# Patient Record
Sex: Male | Born: 1950 | Race: Black or African American | Hispanic: No | Marital: Married | Smoking: Current every day smoker
Health system: Southern US, Community
[De-identification: ages and names within clinical notes are randomized; demographics above are authoritative.]

## PROBLEM LIST (undated history)

## (undated) DIAGNOSIS — R0600 Dyspnea, unspecified: Secondary | ICD-10-CM

## (undated) DIAGNOSIS — I10 Essential (primary) hypertension: Secondary | ICD-10-CM

## (undated) DIAGNOSIS — Z992 Dependence on renal dialysis: Secondary | ICD-10-CM

## (undated) DIAGNOSIS — Z972 Presence of dental prosthetic device (complete) (partial): Secondary | ICD-10-CM

## (undated) DIAGNOSIS — I96 Gangrene, not elsewhere classified: Secondary | ICD-10-CM

## (undated) DIAGNOSIS — E785 Hyperlipidemia, unspecified: Secondary | ICD-10-CM

## (undated) DIAGNOSIS — F0390 Unspecified dementia without behavioral disturbance: Secondary | ICD-10-CM

## (undated) DIAGNOSIS — I739 Peripheral vascular disease, unspecified: Secondary | ICD-10-CM

## (undated) DIAGNOSIS — Z720 Tobacco use: Secondary | ICD-10-CM

## (undated) DIAGNOSIS — F1910 Other psychoactive substance abuse, uncomplicated: Secondary | ICD-10-CM

## (undated) DIAGNOSIS — F32A Depression, unspecified: Secondary | ICD-10-CM

## (undated) DIAGNOSIS — Z9289 Personal history of other medical treatment: Secondary | ICD-10-CM

## (undated) DIAGNOSIS — E1140 Type 2 diabetes mellitus with diabetic neuropathy, unspecified: Secondary | ICD-10-CM

## (undated) DIAGNOSIS — Z89511 Acquired absence of right leg below knee: Secondary | ICD-10-CM

## (undated) DIAGNOSIS — J449 Chronic obstructive pulmonary disease, unspecified: Secondary | ICD-10-CM

## (undated) DIAGNOSIS — N186 End stage renal disease: Secondary | ICD-10-CM

## (undated) HISTORY — PX: AV FISTULA PLACEMENT: SHX1204

## (undated) HISTORY — DX: Type 2 diabetes mellitus with diabetic neuropathy, unspecified: E11.40

## (undated) HISTORY — PX: MULTIPLE TOOTH EXTRACTIONS: SHX2053

## (undated) HISTORY — PX: OTHER SURGICAL HISTORY: SHX169

## (undated) HISTORY — DX: Dependence on renal dialysis: Z99.2

## (undated) HISTORY — DX: Tobacco use: Z72.0

## (undated) HISTORY — DX: Hyperlipidemia, unspecified: E78.5

## (undated) HISTORY — DX: End stage renal disease: N18.6

## (undated) HISTORY — DX: Essential (primary) hypertension: I10

## (undated) HISTORY — DX: Peripheral vascular disease, unspecified: I73.9

---

## 2008-04-20 ENCOUNTER — Ambulatory Visit: Payer: Self-pay | Admitting: Vascular Surgery

## 2008-05-04 ENCOUNTER — Emergency Department (HOSPITAL_COMMUNITY): Admission: EM | Admit: 2008-05-04 | Discharge: 2008-05-04 | Payer: Self-pay | Admitting: Emergency Medicine

## 2008-05-04 ENCOUNTER — Ambulatory Visit (HOSPITAL_COMMUNITY): Admission: RE | Admit: 2008-05-04 | Discharge: 2008-05-04 | Payer: Self-pay | Admitting: Surgery

## 2008-05-04 ENCOUNTER — Ambulatory Visit: Payer: Self-pay | Admitting: Surgery

## 2008-08-24 ENCOUNTER — Ambulatory Visit: Payer: Self-pay | Admitting: Vascular Surgery

## 2008-08-27 ENCOUNTER — Ambulatory Visit (HOSPITAL_COMMUNITY): Admission: RE | Admit: 2008-08-27 | Discharge: 2008-08-27 | Payer: Self-pay | Admitting: Vascular Surgery

## 2008-08-27 ENCOUNTER — Ambulatory Visit: Payer: Self-pay | Admitting: Vascular Surgery

## 2009-04-18 ENCOUNTER — Ambulatory Visit (HOSPITAL_COMMUNITY): Admission: RE | Admit: 2009-04-18 | Discharge: 2009-04-18 | Payer: Self-pay | Admitting: Nephrology

## 2009-11-04 ENCOUNTER — Ambulatory Visit: Payer: Self-pay | Admitting: Surgery

## 2009-12-02 ENCOUNTER — Ambulatory Visit (HOSPITAL_COMMUNITY): Admission: RE | Admit: 2009-12-02 | Discharge: 2009-12-02 | Payer: Self-pay | Admitting: Vascular Surgery

## 2009-12-02 ENCOUNTER — Ambulatory Visit: Payer: Self-pay | Admitting: Vascular Surgery

## 2009-12-16 ENCOUNTER — Ambulatory Visit: Payer: Self-pay | Admitting: Surgery

## 2009-12-30 ENCOUNTER — Ambulatory Visit: Payer: Self-pay | Admitting: Surgery

## 2010-01-27 ENCOUNTER — Ambulatory Visit: Payer: Self-pay | Admitting: Surgery

## 2010-04-16 ENCOUNTER — Ambulatory Visit: Payer: Self-pay | Admitting: Vascular Surgery

## 2010-04-21 ENCOUNTER — Ambulatory Visit (HOSPITAL_COMMUNITY): Admission: RE | Admit: 2010-04-21 | Discharge: 2010-04-21 | Payer: Self-pay | Admitting: Vascular Surgery

## 2010-05-05 ENCOUNTER — Ambulatory Visit (HOSPITAL_COMMUNITY): Admission: RE | Admit: 2010-05-05 | Discharge: 2010-05-05 | Payer: Self-pay | Admitting: Vascular Surgery

## 2010-05-05 ENCOUNTER — Ambulatory Visit: Payer: Self-pay | Admitting: Vascular Surgery

## 2010-06-25 ENCOUNTER — Ambulatory Visit: Payer: Self-pay | Admitting: Vascular Surgery

## 2010-08-13 ENCOUNTER — Encounter
Admission: RE | Admit: 2010-08-13 | Discharge: 2010-08-13 | Payer: Self-pay | Source: Home / Self Care | Attending: Physical Medicine & Rehabilitation | Admitting: Physical Medicine & Rehabilitation

## 2010-12-07 ENCOUNTER — Encounter: Payer: Self-pay | Admitting: Nephrology

## 2011-02-01 LAB — POCT I-STAT 4, (NA,K, GLUC, HGB,HCT)
Glucose, Bld: 164 mg/dL — ABNORMAL HIGH (ref 70–99)
Hemoglobin: 11.2 g/dL — ABNORMAL LOW (ref 13.0–17.0)
Potassium: 5.6 mEq/L — ABNORMAL HIGH (ref 3.5–5.1)

## 2011-02-01 LAB — POCT I-STAT, CHEM 8
Creatinine, Ser: 12.4 mg/dL — ABNORMAL HIGH (ref 0.4–1.5)
Glucose, Bld: 193 mg/dL — ABNORMAL HIGH (ref 70–99)
Hemoglobin: 10.9 g/dL — ABNORMAL LOW (ref 13.0–17.0)
Potassium: 5.4 mEq/L — ABNORMAL HIGH (ref 3.5–5.1)

## 2011-02-01 LAB — GLUCOSE, CAPILLARY: Glucose-Capillary: 158 mg/dL — ABNORMAL HIGH (ref 70–99)

## 2011-02-02 LAB — BASIC METABOLIC PANEL
Chloride: 100 mEq/L (ref 96–112)
GFR calc Af Amer: 5 mL/min — ABNORMAL LOW (ref >60)
Potassium: 6 mEq/L — ABNORMAL HIGH (ref 3.5–5.1)

## 2011-02-02 LAB — POCT I-STAT 4, (NA,K, GLUC, HGB,HCT)
Glucose, Bld: 140 mg/dL — ABNORMAL HIGH (ref 70–99)
HCT: 36 % — ABNORMAL LOW (ref 39.0–52.0)
Hemoglobin: 12.6 g/dL — ABNORMAL LOW (ref 13.0–17.0)
Sodium: 134 mEq/L — ABNORMAL LOW (ref 135–145)

## 2011-02-02 LAB — GLUCOSE, CAPILLARY: Glucose-Capillary: 179 mg/dL — ABNORMAL HIGH (ref 70–99)

## 2011-02-23 LAB — GLUCOSE, CAPILLARY: Glucose-Capillary: 258 mg/dL — ABNORMAL HIGH (ref 70–99)

## 2011-03-10 ENCOUNTER — Inpatient Hospital Stay (HOSPITAL_COMMUNITY)
Admit: 2011-03-10 | Discharge: 2011-03-11 | DRG: 291 | Disposition: A | Discharge status: Home / Self Care | Payer: Medicare Other | Source: Other Acute Inpatient Hospital | Attending: Internal Medicine | Admitting: Internal Medicine

## 2011-03-10 ENCOUNTER — Inpatient Hospital Stay (HOSPITAL_COMMUNITY): Payer: Medicare Other

## 2011-03-10 DIAGNOSIS — I509 Heart failure, unspecified: Principal | ICD-10-CM | POA: Diagnosis present

## 2011-03-10 DIAGNOSIS — E785 Hyperlipidemia, unspecified: Secondary | ICD-10-CM | POA: Diagnosis present

## 2011-03-10 DIAGNOSIS — E1149 Type 2 diabetes mellitus with other diabetic neurological complication: Secondary | ICD-10-CM | POA: Diagnosis present

## 2011-03-10 DIAGNOSIS — Z794 Long term (current) use of insulin: Secondary | ICD-10-CM

## 2011-03-10 DIAGNOSIS — D649 Anemia, unspecified: Secondary | ICD-10-CM | POA: Diagnosis present

## 2011-03-10 DIAGNOSIS — I120 Hypertensive chronic kidney disease with stage 5 chronic kidney disease or end stage renal disease: Secondary | ICD-10-CM | POA: Diagnosis present

## 2011-03-10 DIAGNOSIS — N2581 Secondary hyperparathyroidism of renal origin: Secondary | ICD-10-CM | POA: Diagnosis present

## 2011-03-10 DIAGNOSIS — E8779 Other fluid overload: Secondary | ICD-10-CM | POA: Diagnosis present

## 2011-03-10 DIAGNOSIS — N186 End stage renal disease: Secondary | ICD-10-CM | POA: Diagnosis present

## 2011-03-10 DIAGNOSIS — E1142 Type 2 diabetes mellitus with diabetic polyneuropathy: Secondary | ICD-10-CM | POA: Diagnosis present

## 2011-03-10 LAB — GLUCOSE, CAPILLARY
Glucose-Capillary: 132 mg/dL — ABNORMAL HIGH (ref 70–99)
Glucose-Capillary: 181 mg/dL — ABNORMAL HIGH (ref 70–99)

## 2011-03-10 LAB — RENAL FUNCTION PANEL
Albumin: 3.2 g/dL — ABNORMAL LOW (ref 3.5–5.2)
BUN: 46 mg/dL — ABNORMAL HIGH (ref 6–23)
Chloride: 94 mEq/L — ABNORMAL LOW (ref 96–112)
Creatinine, Ser: 9.44 mg/dL — ABNORMAL HIGH (ref 0.4–1.5)
Glucose, Bld: 111 mg/dL — ABNORMAL HIGH (ref 70–99)

## 2011-03-10 LAB — CBC
MCH: 33.7 pg (ref 26.0–34.0)
MCV: 99.2 fL (ref 78.0–100.0)
Platelets: 134 10*3/uL — ABNORMAL LOW (ref 150–400)
RBC: 2.58 MIL/uL — ABNORMAL LOW (ref 4.22–5.81)
RDW: 16.1 % — ABNORMAL HIGH (ref 11.5–15.5)

## 2011-03-10 LAB — MRSA PCR SCREENING: MRSA by PCR: NEGATIVE

## 2011-03-10 LAB — CARDIAC PANEL(CRET KIN+CKTOT+MB+TROPI)
CK, MB: 2.3 ng/mL (ref 0.3–4.0)
CK, MB: 2.2 ng/mL (ref 0.3–4.0)

## 2011-03-11 LAB — COMPREHENSIVE METABOLIC PANEL
AST: 30 U/L (ref 0–37)
CO2: 28 mEq/L (ref 19–32)
Chloride: 105 mEq/L (ref 96–112)
Creatinine, Ser: 5.74 mg/dL — ABNORMAL HIGH (ref 0.4–1.5)
GFR calc Af Amer: 12 mL/min — ABNORMAL LOW (ref >60)
GFR calc non Af Amer: 10 mL/min — ABNORMAL LOW (ref >60)
Glucose, Bld: 117 mg/dL — ABNORMAL HIGH (ref 70–99)
Total Bilirubin: 0.7 mg/dL (ref 0.3–1.2)

## 2011-03-11 LAB — GLUCOSE, CAPILLARY
Glucose-Capillary: 134 mg/dL — ABNORMAL HIGH (ref 70–99)
Glucose-Capillary: 144 mg/dL — ABNORMAL HIGH (ref 70–99)

## 2011-03-11 LAB — CBC
HCT: 28.4 % — ABNORMAL LOW (ref 39.0–52.0)
Hemoglobin: 9.4 g/dL — ABNORMAL LOW (ref 13.0–17.0)
MCH: 33.6 pg (ref 26.0–34.0)
MCV: 101.4 fL — ABNORMAL HIGH (ref 78.0–100.0)
RBC: 2.8 MIL/uL — ABNORMAL LOW (ref 4.22–5.81)

## 2011-03-11 LAB — DIFFERENTIAL
Basophils Relative: 0 % (ref 0–1)
Lymphocytes Relative: 10 % — ABNORMAL LOW (ref 12–46)
Lymphs Abs: 0.8 10*3/uL (ref 0.7–4.0)
Monocytes Relative: 9 % (ref 3–12)
Neutro Abs: 6.4 10*3/uL (ref 1.7–7.7)
Neutrophils Relative %: 78 % — ABNORMAL HIGH (ref 43–77)

## 2011-03-11 LAB — LIPID PANEL: Triglycerides: 63 mg/dL (ref <150)

## 2011-03-11 LAB — CARDIAC PANEL(CRET KIN+CKTOT+MB+TROPI)
Relative Index: 2.2 (ref 0.0–2.5)
Total CK: 101 U/L (ref 7–232)
Troponin I: 0.09 ng/mL — ABNORMAL HIGH (ref 0.00–0.06)

## 2011-03-11 LAB — HEMOGLOBIN A1C: Mean Plasma Glucose: 131 mg/dL — ABNORMAL HIGH (ref <117)

## 2011-03-17 ENCOUNTER — Encounter (HOSPITAL_COMMUNITY): Payer: Medicare Other | Admitting: Radiology

## 2011-03-25 ENCOUNTER — Ambulatory Visit (HOSPITAL_COMMUNITY): Payer: Medicare Other | Attending: Cardiology | Admitting: Radiology

## 2011-03-25 VITALS — Ht 64.0 in | Wt 120.0 lb

## 2011-03-25 DIAGNOSIS — R0989 Other specified symptoms and signs involving the circulatory and respiratory systems: Secondary | ICD-10-CM | POA: Insufficient documentation

## 2011-03-25 DIAGNOSIS — R5381 Other malaise: Secondary | ICD-10-CM | POA: Insufficient documentation

## 2011-03-25 DIAGNOSIS — F17200 Nicotine dependence, unspecified, uncomplicated: Secondary | ICD-10-CM | POA: Insufficient documentation

## 2011-03-25 DIAGNOSIS — R0609 Other forms of dyspnea: Secondary | ICD-10-CM | POA: Insufficient documentation

## 2011-03-25 DIAGNOSIS — R61 Generalized hyperhidrosis: Secondary | ICD-10-CM | POA: Insufficient documentation

## 2011-03-25 DIAGNOSIS — R0789 Other chest pain: Secondary | ICD-10-CM | POA: Insufficient documentation

## 2011-03-25 DIAGNOSIS — I739 Peripheral vascular disease, unspecified: Secondary | ICD-10-CM | POA: Insufficient documentation

## 2011-03-25 DIAGNOSIS — R079 Chest pain, unspecified: Secondary | ICD-10-CM

## 2011-03-25 DIAGNOSIS — Z794 Long term (current) use of insulin: Secondary | ICD-10-CM | POA: Insufficient documentation

## 2011-03-25 DIAGNOSIS — E119 Type 2 diabetes mellitus without complications: Secondary | ICD-10-CM | POA: Insufficient documentation

## 2011-03-25 DIAGNOSIS — I10 Essential (primary) hypertension: Secondary | ICD-10-CM | POA: Insufficient documentation

## 2011-03-25 DIAGNOSIS — R5383 Other fatigue: Secondary | ICD-10-CM | POA: Insufficient documentation

## 2011-03-25 MED ORDER — TECHNETIUM TC 99M TETROFOSMIN IV KIT
11.0000 | PACK | Freq: Once | INTRAVENOUS | Status: AC | PRN
  Administered 2011-03-25: 11 via INTRAVENOUS

## 2011-03-25 MED ORDER — TECHNETIUM TC 99M TETROFOSMIN IV KIT
33.0000 | PACK | Freq: Once | INTRAVENOUS | Status: AC | PRN
  Administered 2011-03-25: 33 via INTRAVENOUS

## 2011-03-25 MED ORDER — REGADENOSON 0.4 MG/5ML IV SOLN
0.4000 mg | Freq: Once | INTRAVENOUS | Status: AC
  Administered 2011-03-25: 0.4 mg via INTRAVENOUS

## 2011-03-25 NOTE — Progress Notes (Signed)
[facility]UCLE[facility] [place] [address] [phone]  Cardiology Nuclear Med Garrett Salas is a 60 y.o. male [id number] 07/06/1951   Nuclear Med Background Indication for Stress Test:  Evaluation for Ischemia and Post Hospital:03/10/11 CP,CHF, (-) enzymes History:  ~14 yrs ago PTCA (done at the [place]), 4/12 Echo:EF=60-65% Cardiac Risk Factors: Hypertension, IDDM Type 2, Lipids, PVD and Smoker  Symptoms:  Chest Pain/Pressure.  (last episode of chest discomfort has been none since discharge), Diaphoresis, DOE and Fatigue   Nuclear Pre-Procedure Caffeine/Decaff Intake:  None NPO After: 6:30pm   Lungs:  Clear.  O2 sat 96% on RA. IV 0.9% NS with Angio Cath:  20g  IV Site: L Antecubital  IV Started by:  Irven Baltimore, RN  Chest Size (in):  38 Cup Size: n/a  Height: 5\' 4"  (1.626 m)  Weight:  120 lb (54.432 kg)  BMI:  Body mass index is 20.60 kg/(m^2). Tech Comments:n/a    Nuclear Med Study 1 or 2 day study: 1 day  Stress Test Type:  Carlton Adam  Reading MD: Loralie Champagne, MD  Order Authorizing Provider:  Minus Breeding, MD  Resting Radionuclide: Technetium 36m Tetrofosmin  Resting Radionuclide Dose: 11.0 mCi   Stress Radionuclide:  Technetium 39m Tetrofosmin  Stress Radionuclide Dose: 33.0 mCi           Stress Protocol Rest HR: 64 Stress HR: 75  Rest BP: 147/72 Stress BP: 159/61  Exercise Time (min): n/a METS: n/a   Predicted Max HR: 162 bpm % Max HR: 46.91 bpm Rate Pressure Product: 11248   Dose of Adenosine (mg):  n/a Dose of Lexiscan: 0.4 mg  Dose of Atropine (mg): n/a Dose of Dobutamine: n/a mcg/kg/min (at max HR)  Stress Test Technologist: Letta Moynahan, CMA-N  Nuclear Technologist:  Charlton Amor, CNMT     Rest Procedure:  Myocardial perfusion imaging was performed at rest 45 minutes following the intravenous administration of Technetium 4m Tetrofosmin. Rest ECG: No acute changes.  Stress Procedure:  The patient  received IV Lexiscan 0.4 mg over 15-seconds.  Technetium 44m Tetrofosmin injected at 30-seconds.  There were no significant changes with Lexiscan.  Quantitative spect images were obtained after a 45 minute delay. Stress ECG: No significant change from baseline ECG and No significant ST segment change suggestive of ischemia.  QPS Raw Data Images:  Normal; no motion artifact; normal heart/lung ratio. Stress Images:  Normal homogeneous uptake in all areas of the myocardium. Rest Images:  Normal homogeneous uptake in all areas of the myocardium. Subtraction (SDS):  There is no evidence of scar or ischemia. Transient Ischemic Dilatation (Normal <1.22):  1.10 Lung/Heart Ratio (Normal <0.45):  0.29  Quantitative Gated Spect Images QGS EDV:  182 ml QGS ESV:  111 ml QGS cine images:  Moderate global hypokinesis.  QGS EF: 39%  Impression Exercise Capacity:  Lexiscan with no exercise. BP Response:  Normal blood pressure response. Clinical Symptoms:  No chest pain. ECG Impression:  No significant ST segment change suggestive of ischemia. Comparison with Prior Nuclear Study: No previous nuclear study performed  Overall Impression:  No evidence for ischemia or infarction by perfusion imaging but there dose appear to be global LV hypokinesis with EF 39%.  This suggests nonischemic cardiomyopathy.   Garrett Salas

## 2011-03-26 NOTE — Progress Notes (Signed)
COPY ROUTED TO DR. HOCHREIN

## 2011-03-31 NOTE — Consult Note (Signed)
NEW PATIENT CONSULTATION   Garrett Salas, Garrett Salas  DOB:  1951-11-09                                       04/20/2008  [id number]   The patient presents today for evaluation of permanent access placement  for hemodialysis.  He had a Diatek catheter placed February 19 at [facility].  he [place] on Tuesday, Thursday, and Saturday  at [place].  He has had no difficulty thus far with his catheter.  His  prior medical history is significant for insulin dependent diabetes,  hypertension.   FAMILY HISTORY:  Negative for premature atherosclerotic disease.   SOCIAL HISTORY:  He does not smoke having quit 1 year ago, and does not  drink alcohol.   He has no known drug allergies.   MEDICATIONS:  Simvastatin, clonidine, metoprolol, Lotrel, and insulin.   PHYSICAL EXAM:  Well-developed thin black male in no acute distress, in  a wheelchair.  His radial pulses are 2+ bilaterally.  He does have  moderate surface veins bilaterally.  He underwent bilateral venous  mapping.  He reports that he is ambidextrous.  He does appear to have  adequate cephalic vein in the left arm for attempt at AV fistula  placement.  I reviewed his vein mapping with the patient and his wife  and have recommended that he proceed with a left arm AV fistula.  I feel  that he, in all likelihood has adequate flow for wrist fistula, but also  has good vein size for upper arm fistula if this is required.  We will  schedule this at his request around his wife's work schedule.  He is  scheduled for a left wrist fistula on the morning of 05/04/2008 at [place], M.D.  Electronically Signed   TFE/MEDQ  D:  04/20/2008  T:  04/23/2008  Job:  [id number]   cc:   Sherril Croon, M.D.

## 2011-03-31 NOTE — Assessment & Plan Note (Signed)
OFFICE VISIT   Garrett Salas, Garrett Salas  DOB:  May 11, 1951                                       11/04/2009  [id number]   REFERRING PHYSICIAN:  Dr. Justin Mend.   CHIEF COMPLAINT:  Left toe pain.   HISTORY:  This is a 60 year old gentleman with multiple medical problems  who has had a soreness and swelling in his left second toe for  approximately 6 weeks.  This appears to be getting worse.  The patient's  medical problems include hepatitis C, end-stage renal disease, for which  he dialyzes on Tuesday, Thursday, and Saturday through a left  radiocephalic fistula.  Patient has uncontrolled hypertension as well as  hypercholesterolemia.  These are both treated medically.  He also  suffers from peripheral neuropathy secondary to his diabetes.  He also  has peripheral artery disease and retinopathy.  He is legally blind.   FAMILY HISTORY:  Positive for cancer in his mother.   SOCIAL HISTORY:  He is married with 5 children.  He currently smokes 1  pack a day.  He does not drink alcohol on a regular basis.   PAST MEDICAL HISTORY:  Diabetes, hypertension, hyperlipidemia, end-stage  renal disease, peripheral neuropathy, anemia, secondary  hyperparathyroidism, history of membranous proliferative  glomerulonephritis, history of hepatitis C, peripheral artery disease,  history of retinopathy and legally blind.   ALLERGIES:  None.   PHYSICAL EXAMINATION:  Heart rate 65, blood pressure 159/77, temperature  97.8.  GENERAL:  He is well-appearing in no distress.  Head and eyes:  Normocephalic and atraumatic.  Pupils are equal.  Sclerae anicteric.  Lungs:  Respirations are nonlabored.  Cardiovascular:  Regular rate and  rhythm.  Abdomen:  Soft, nontender.  Musculoskeletal:  Without major  deformities or cyanosis.  The left second toe is more prominent.  There  is no erythema.  Skin:  Without rash.  Neurologically, he is intact  without focal weakness.  Psych  He has a normal  affect.   DIAGNOSTICS:  I have reviewed the patient's ultrasound report that shows  ankle brachial index of 0.72 on the left and 0.94 on the right.   ASSESSMENT:  Ischemic left second toe.   PLAN:  The patient will be scheduled for an arteriogram of the left leg.  I discussed the details of the procedure as well as the possibility of a  percutaneous intervention, versus requiring bypass to improve blood flow  to his foot in order to heal this area.  He also understands that he  could end up with a toe amputation.  He knows this is a leg-threatening  process.  He does not wish to have this done before Christmas;  therefore, I am scheduling his procedure for Tuesday, January 11.  I  will try to do this as a first case to coordinate with his dialysis  schedule.  Hopefully, he can do dialysis later on that day.     [name], MD  Electronically Signed   VWB/MEDQ  D:  11/04/2009  T:  11/05/2009  Job:  [id number]   cc:   Sherril Croon, M.D.

## 2011-03-31 NOTE — Assessment & Plan Note (Signed)
OFFICE VISIT   Garrett Salas, Garrett Salas  DOB:  29-Mar-1951                                       06/25/2010  [id number]   The patient returns for followup today after placement of a right  brachiocephalic AV fistula on June 21.  He intermittently has some mild  numbness and tingling in his right hand early in the morning and then  this resolves.  It has not been very disabling to him.  He has no other  signs or symptoms of steal.   On physical exam today blood pressure is 153/98 in the right leg, heart  rate is 70 and regular.  Right upper extremity has an easily palpable  thrill and audible bruit in the fistula.  The vein is palpable  throughout most of the course of the upper arm.   I believe the best option is to continue to allow this fistula to  mature.  He was instructed in exercising the fistula today.  He will  return for followup in six weeks' time.  I have scheduled him for  followup with Dr. Scot Dock since he has seen him previously and my office  day will be switching to the patient's dialysis day.  He preferred to be  seen on a nondialysis day since he has to drive from [place].  Hopefully his fistula will be ready to use near the time of his next  followup appointment.     Jessy Oto. Fields, MD  Electronically Signed   CEF/MEDQ  D:  06/25/2010  T:  06/26/2010  Job:  [id number]   cc:   Maudie Flakes. Hassell Done, M.D.

## 2011-03-31 NOTE — Assessment & Plan Note (Signed)
OFFICE VISIT   Garrett Salas, Garrett Salas  DOB:  1951-03-28                                       01/27/2010  [id number]   REFERRING PHYSICIAN:  Dr. Justin Mend.   REASON FOR VISIT:  Follow-up left second toe.   HISTORY:  Patient is a 60 year old gentleman that I met in December  where he had soreness and swelling of his left second toe that had been  going on for approximately 6 weeks.  It was getting worse at that time.  He was scheduled for an arteriogram; however, he did not want that to be  done before the holiday.  It therefore was actually done by Dr. Scot Dock  on January 17th.  The patient was found to have approximately 40% long  segment stenosis within his proximal superficial femoral artery and a  focal 40% stenosis below that.  There was moderate disease in the  popliteal artery.  The patient had an ankle brachial index of 0.72.  I  felt that he most likely will be able to heal this without intervention.  He comes back in today for continued follow-up.  He says that his toe  has improved.  He is not walking much but says it is stiff.  He is  usually in a wheelchair secondary to multiple falls.   PHYSICAL EXAMINATION:  Blood pressure is 187/100, heart rate is 64,  temperature is 98.0.  He is well-appearing in no distress.  Respirations  are nonlabored.  The left second toe appears to have decreased edema.  There is no evidence of infection.  There is no ulceration or open  wounds.   PLAN:  The patient continues to improve.  I think he will be able to  resolve this issue without an intervention.  I will plan on seeing him  back on a p.r.n. basis.  I did instruct him that if he notices any  changes to contact me immediately, as timing can make a difference.  He  understands this.     [name], MD  Electronically Signed   VWB/MEDQ  D:  01/27/2010  T:  01/28/2010  Job:  [id number]   cc:   Sherril Croon, M.D.

## 2011-03-31 NOTE — Procedures (Signed)
CEPHALIC VEIN MAPPING   INDICATION:  Preop exam for AV fistula placement.   HISTORY:   EXAM:  The right cephalic vein is compressible.   Diameter measurements range from 0.26 to 0.33 cm.   The left cephalic vein is compressible.   Diameter measurements range from 0.27 to 0.49 cm.   See attached worksheet for all measurements.   IMPRESSION:  1. Patent right cephalic vein which is of questionable diameter for      use as a dialysis access site.  2. Patent left cephalic vein which is of acceptable diameter for use      as a dialysis access site.   ___________________________________________  Rosetta Posner, M.D.   CH/MEDQ  D:  04/20/2008  T:  04/20/2008  Job:  [id number]

## 2011-03-31 NOTE — Procedures (Signed)
DIALYSIS GRAFT DUPLEX EVALUATION   INDICATION:  Poor access flow.   HISTORY:   DUPLEX:                                   Duplex Velocities  Inflow artery                   78 cm/s  Inflow anastomosis              125 cm/s  Mid arterial limb               266 cm/s  Mid to distal 613 cm/s  Mid graft                       282 cm/s  Mid venous limb                 54 cm/s  Outflow anastomosis             46 cm/s  Outflow vein                    84 cm/s   IMPRESSION:  There appears to be elevated velocities of 613 cm/s at the  proximal to mid arterial limb.  The antecubital fossa has velocities of  282 cm/s.   ___________________________________________  Jessy Oto. Fields, MD   CB/MEDQ  D:  04/16/2010  T:  04/16/2010  Job:  [id number]

## 2011-03-31 NOTE — Assessment & Plan Note (Signed)
OFFICE VISIT   DELVANTE, HILLING  DOB:  May 10, 1951                                       12/30/2009  [id number]   The patient returns today for evaluation of his left second toe.  Again,  the patient has undergone arteriogram.  He had mild disease in his  superficial femoral artery with significant changes in his below-knee  popliteal artery and stenosis of about 60%.  Ankle-brachial indices were  0.72.  I feel that he will most likely have adequate blood supply to  heal this.  He comes back in today for followup.  He did not tolerate  this pain medicine secondary to GI issues.   PHYSICAL EXAMINATION:  Heart rate 79, blood pressure 106/70, temperature  is 97.6.  Generally, he is well-appearing, in no distress.  Respirations  are nonlabored.  Extremities:  The left second toe has decreased edema.  There is less tenderness.  There is no erythema.   At this point I am optimistic that this will be able to heal without  intervention.  I am going to see him back in a month.  I did give him  Tylox today instead of his Percocet, which he did not tolerate.     [name], MD  Electronically Signed   VWB/MEDQ  D:  12/30/2009  T:  12/31/2009  Job:  [id number]   cc:   Sherril Croon, M.D.

## 2011-03-31 NOTE — Assessment & Plan Note (Signed)
OFFICE VISIT   KASI, DAIN  DOB:  1951-07-30                                       12/16/2009  [id number]   The patient returns today for follow-up evaluation of his left second  toe.  I first met him in December, where he had swelling and pain in his  left second toe for approximately 6 weeks which was getting worse.  I  had scheduled him to undergo an arteriogram.  He did not want to do this  before Christmas.  It was scheduled for January 11.  He rescheduled and  it was performed by Dr. Scot Dock on January 17.  Findings included mild  disease of the superficial femoral artery.  The patient does have  significant atherosclerotic changes in his below-knee popliteal artery  with a stenosis of approximately 60%.  However, he appears to have  adequate blood flow down onto his foot.  He comes back today for follow-  up.   On examination, heart rate 79, blood pressure 143/74, temperature is 98.  He is well-appearing, in no distress.  The left second toe is somewhat  swollen.  The DIP joint is tender.  There is no erythema.   Based on the patient's arteriogram and ankle-brachial index of 0.72, I  feel like this process should improve with conservative management.  I  have given him a prescription for Percocet to help with the pain.  He is  also encouraged to take Advil as an anti-inflammatory.  I am going to  see him back in 2 weeks to see if he has had any benefit.  Our options  at this point will be __________ is warranted.     [name], MD  Electronically Signed   VWB/MEDQ  D:  12/16/2009  T:  12/18/2009  Job:  [id number]   cc:   Sherril Croon, M.D.

## 2011-03-31 NOTE — Assessment & Plan Note (Signed)
OFFICE VISIT   Garrett Salas, Garrett Salas  DOB:  15-Oct-1951                                       04/16/2010  [id number]   CHIEF COMPLAINT:  Poorly performing left AV fistula.   HISTORY OF PRESENT ILLNESS:  The patient is a 60 year old male who  currently is dialyzing via a left radiocephalic AV fistula.  This was  placed by Dr. Donnetta Hutching in October 2009.  He apparently has been having low  flow rates requiring placement of tourniquet on his arm during dialysis  to have reasonable flow.  He also had an angioplasty of the cephalic  subclavian junction at [facility] on Mar 17, 2010.  He denies any  numbness or tingling in his hand.  He denies any difficulty with  dialysis other than the fact that they have low flow on the machine  alarms frequently.   CHRONIC MEDICAL PROBLEMS:  1. Diabetes.  2. Renal failure requiring dialysis.  3. Hypertension.  4. Hyperlipidemia.  5. Peripheral arterial occlusive disease.  These are all currently controlled, most of these are followed by Dr.  Hassell Done.  He also has a history of hepatitis C, alcohol and drug abuse.  He  is a former smoker.  He has been seen by Dr. Scot Dock and Dr. Trula Slade IV  in the past for peripheral arterial occlusive disease.   Well except as mentioned above.   REVIEW OF SYSTEMS:  He denies any shortness of breath or chest pain.   MEDICATIONS:  1. Procardia XL 30 mg q.h.s.  2. Rena-Vite once daily.  3. Hydrocodone 5 mg as needed.  4. Lantus insulin 25 units q.h.s.  5. Atarax p.r.n.  6. Nabumetone 750 mg b.i.d.  7. Cetirizine 10 mg once a day.  8. Reglan 10 mg q.a.c. and q.h.s.  9. Renvela 800 mg two q.a.c. t.i.d.  10.Triamcinolone.  11.Simvastatin 40 mg once daily.  12.NovoLog insulin sliding scale meal coverage.  13.Gabapentin 400 mg q.h.s.   ALLERGIES:  LISTED TO THE OXYCODONE AND ALBUTEROL, BUT AS NOTED ABOVE HE  DOES TAKE HYDROCODONE.   PHYSICAL EXAM:  Blood pressure is 135/83 in the right  arm, heart rate 68  and regular.  Temperature is 98.0.  HEENT is unremarkable.  Neck:  Has  2+ carotid pulses without bruit.  Extremities:  He has no edema  bilaterally.  There is an easily palpable thrill in left radiocephalic  fistula proximal aspect of this is fairly small and the vein dilates in  its midportion segment and becomes small in the upper arm again.  There  is an audible bruit.  Right upper extremity has a 2+ brachial and radial  pulse.  Cephalic vein is not easily visible on the skin.  Chest is clear  to auscultation bilaterally.   He had a vein mapping ultrasound today of his right upper extremity  which shows a small cephalic vein less than 2 mm in diameter throughout  its course.  The basilic vein is also essentially less than 2 mm in  diameter throughout its course.  I reviewed the images from his  shuntogram recently performed at [facility] which shows diffuse  narrowing of the proximal aspect of the fistula as well as a high-grade  stenosis which was angioplastied at the cephalic subclavian junction.   After review the films of his left AV fistula I  do not believe that this  is really going to be revisable.  There are multiple areas that have  narrowing in it and the cephalic vein on the left side is really not a  good outflow vein at this point.  I do not believe he is a candidate for  a left brachiocephalic fistula because of chronic intermittent  angioplasty that have been required at the cephalic subclavian junction.  I believe the best option at this point will be to place a new access.  The cephalic vein is poor as well as his basilic in the right arm, we  will explore his right cephalic fistula at the antecubital level and if  this is confirmed to be small then most likely we would end up placing a  right upper arm AV graft.  He can continue to use a left arm fistula  until this matures to avoid using a catheter.  I discussed all this with  the  patient today.  We have scheduled him for placement of the right arm  AV graft versus fistula on April 21, 2010.  Risks, benefits, possible  complications and procedure details including but not limited to  bleeding, infection, graft occlusion, nonmaturation of fistula and  ischemic steal were explained to the patient today, he understands and  agrees to proceed.     Jessy Oto. Fields, MD  Electronically Signed   CEF/MEDQ  D:  04/16/2010  T:  04/17/2010  Job:  [id number]   cc:   Dr. Salem Senate

## 2011-03-31 NOTE — Consult Note (Signed)
NAME:  Garrett Salas, Garrett Salas NO.:  [id number]   MEDICAL RECORD NO.:  [id number]          PATIENT TYPE:  INP   LOCATION:  NA                           FACILITY:  [facility]   PHYSICIAN:  Marcial Pacas, MD          DATE OF BIRTH:  11-Oct-1951   DATE OF CONSULTATION:  DATE OF DISCHARGE:  05/04/2008                                 CONSULTATION   CHIEF COMPLAINT:  Right lower extremity acute onset of pain, numbness,  and gait difficulty.   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  He went to his  Dialysis Unit at [facility] about 9 o'clock yesterday morning, wife  reported that he actually walked into the dialysis unit, while sit there  waiting, he has developed severe right lower extremity pain, from right  knee down, he described as a stabbing-throbbing pain 10/10.  Because of  the pain, he can barely walk.  They have to wheelchair him.  But, he  denies right upper extremity or left upper or lower extremity  involvement.   Since the symptom onset, he stated that overall, he is much worse.  He  has experienced more severe pain, and more difficulty using his right  leg.  In addition, he described numbness involving the right medial  thigh and the right lower extremity. He is here to have right AV fistula  placement surgery.  Because of the complaint, he was sent to the  emergency room for further evaluation.   He does also have a history of lens-dependent paresthesia for a couple  of years. 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   In addition, wife also reported that he has recently had increased  unbalanced gait, and occasionally used walker, but ambulated without  assistance majority of the time.  In addition, he no longer produced  much urine, but has occasionally bowel incontinence.   REVIEW OF SYSTEMS:  He denies headache, chest pain, and he does  complains of shortness of breath with exertion.   PAST MEDICAL HISTORY:  1. Hemodialysis dependent end-stage renal disease.  2. Diabetes.  3. Hypertension.  4. Hyperlipidemia.  5. Peripheral vascular disease.   PAST SURGICAL HISTORY:  None   SOCIAL HISTORY:  He smoked 20 years, and was a heavy drinker and quit 2  years ago.  Lives with his wife and his family.   FAMILY HISTORY:  Noncontributory.   ALLERGIES:  No known drug allergies.   PHYSICAL EXAMINATION:  VITAL SIGNS:  Temperature 98.9, heart rate of 78,  respirations of 20, blood pressure 121/81, and SpO2 of 96%.  CARDIAC:  Regular rate and rhythm.  NECK:  Supple.  No carotid bruits.  NEUROLOGY:  He is alert and oriented to history taking and causal  conversation, wearing sun glasses in a dim room, complains of loss of  vision on the left eye and photophobia on the right eye.  Cranial nerves II-XII.  Pupils are round, sluggishly active, with severe  right photophobia, but extraocular movement was full.  I was not able to  visualized fundi.  Visual acuity OD finger count, OS no light sensation  .  Facial sensation and strength was normal.  Uvula and tongue midline.  Tongue protrusion in to the cheeks strength was normal.  There was a  mild limitation on right shoulder shrugging, likely attribute to right  chest vas cath.   Motor examination: normal tone and bulk all 4 extremities. There was a  mild weakness on the right upper extremity proximal and distal muscles  but the strength is at least 5-, the limb motion is limited by the fact  he has a vas cath on the right chest and IV insertion on the  right  brachial vein.  The right lower extremity examination is severely  limited by the right knee/leg pain.  There was a mild perfusion at the  right knee, but the bilateral tibial artery pulse was symmetric and the  right lower extremity was warm to touch.  The right lower extremity  proximal and upper extremity motor strength was at least 4/5 with  encouragement.  Left upper and lower extremity motor strength was  normal.   Sensory examination demonstrates length dependent decreased pin prick  and there was mildly decreased vibratory sensation at the left leg.  The  sensory examination of the right lower extremity was inconsistent, and  he described numbness from the right knee down also involving the right  medium thigh.  Deep tendon reflex, brachial radialis 0/0, biceps 1/1,  triceps 1/1, patella 0/0,  Achilles 0/0, plantar responses were flexor.  No dysmetria or gait was refused by the patient.   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   Laboratory has demonstrated elevated glucose 164, creatinine 7.6, WBC  normal 10.3, and platelet count of 140.   ASSESSMENT AND PLAN:  A 60 year old gentleman with complicated past  medical history including long standing insulin-dependent diabetes,  previous alcohol and smoker, peripheral vascular disease, hypertension,  hyperlipidemia, and presenting with acute onset of right knee pain,  right leg weakness, and also right leg numbness.  There was at least 4/5  right lower extremity motor strength with encouragements but limited by  the right leg pain.  The differentiation diagnosis including right  lumbosacral radiculopathy, lumbosacral plexopathy, right knee infection  versus right lower extremity arterial occlusion, and a low possibility  of a stroke involving left hemisphere.   PLAN:  1. MRI of the brain and lumbosacral spine.  2. Ultrasound of right lower  extremity.  3. X-ray of the right knee.  4. Lab evaluations looking for a possibility of infection such as ESR,      CRP and RPR.  We will follow along the patient.      Marcial Pacas, MD  Electronically Signed     YY/MEDQ  D:  05/04/2008  T:  05/05/2008  Job:  [id number]

## 2011-03-31 NOTE — Op Note (Signed)
NAME:  Garrett Salas, Garrett Salas NO.:  [id number]   MEDICAL RECORD NO.:  [id number]          PATIENT TYPE:  AMB   LOCATION:  SDS                          FACILITY:  [facility]   PHYSICIAN:  Rosetta Posner, M.D.    DATE OF BIRTH:  Jul 06, 1951   DATE OF PROCEDURE:  08/27/2008  DATE OF DISCHARGE:                               OPERATIVE REPORT   PREOPERATIVE DIAGNOSIS:  End-stage renal disease.   POSTOPERATIVE DIAGNOSIS:  End-stage renal disease.   PROCEDURE:  Creation of left wrist Cimino arteriovenous fistula.   SURGEON:  Rosetta Posner, M.D.   ASSISTANT:  Chad Cordial, [facility].   ANESTHESIA:  MAC.   COMPLICATIONS:  None.   DISPOSITION:  To recovery room, stable.   PROCEDURE IN DETAIL:  The patient was taken to the operating room and  placed in supine position.  The area of the left arm was prepped and  draped in the usual sterile fashion.  Using local anesthesia, an  incision was made between the level of the cephalic vein and the radial  artery to wrist.  The cephalic vein was mobilized proximally and  distally and tributary branches were ligated with 3-0 and 4-0 silk ties  and divided.  The vein was ligated distally with a 2-0 silk tie and was  divided and it was gently dilated and was of good caliber.  The radial  artery was exposed to the same incision.  The radial artery was occluded  proximally and distally and was opened with #11 blade extending  longitudinally with Potts scissors.  The vein was cut to the appropriate  length, spatulated and sewn end to side of the artery with a running 6-0  Prolene suture.  Clamps were removed and good flow was noted.  The wound  was irrigated with saline.  Hemostasis with electrocautery.  The wound  was closed with 3-0 Vicryl in the subcutaneous and subcuticular stitch.  Benzoin and Steri-Strips were applied.      Rosetta Posner, M.D.  Electronically Signed     TFE/MEDQ  D:  08/27/2008  T:  08/28/2008  Job:  [id number]

## 2011-03-31 NOTE — Procedures (Signed)
CEPHALIC VEIN MAPPING   INDICATION:  ESRD.   HISTORY:  Failed left upper extremity AVF.   EXAM:   The right cephalic vein is compressible except for in the distal  brachium which is partially compressible.   The right basilic vein is compressible except for near ACF where  partially compressible.   Diameter measurements in the right cephalic vein range from 0000000 cm to  0.22 cm.   The left cephalic vein is not evaluated.   See attached worksheet for all measurements.   IMPRESSION:  1. Patent right cephalic vein which is not of acceptable diameter for      use as a dialysis access site.  2. Patent right basilic vein which is of small caliber.   ___________________________________________  Jessy Oto Fields, MD   AS/MEDQ  D:  04/16/2010  T:  04/16/2010  Job:  [id number]

## 2011-03-31 NOTE — Assessment & Plan Note (Signed)
OFFICE VISIT   Garrett Salas, Garrett Salas  DOB:  12-Oct-1951                                       08/24/2008  [id number]   The patient presents today for continued discussion regarding AV access.  I had seen him initially on April 20, 2008, for access planning.  At that  time he had undergone ultrasound of his cephalic veins bilaterally and  was felt to be an acceptable candidate for attempted left arm AV  fistula.  He reports that he is ambidextrous.  He chickened out  regarding his planned surgery on May 04, 2008.  He does continue to be  dialyzed via a right IJ Diatek catheter and this has been present since  February.  Physical exam is unchanged.  He does have a 1 to 2+ left  radial and 2 to 3+ left brachial pulse, he does have moderate sized  surface veins bilaterally.  I again discussed the potential risk of long  term dialysis catheter with infectious complications, I have recommended  that we proceed with primary AV fistula placement on the left.  I  explained that we would initially explore his cephalic vein at the wrist  and would perform a wrist fistula and if the vein is not acceptable  would perform a brachiocephalic fistula.  I plan this as an outpatient  procedure and it would require at least 3 months for maturation.  I also  explained that this could go on to nonmaturation, he would require  additional access planning.  He understands and agrees to proceed on  August 27, 2008, as an outpatient at [facility], M.D.  Electronically Signed   TFE/MEDQ  D:  08/24/2008  T:  08/24/2008  Job:  [id number]   cc:   Sherril Croon, M.D.  [facility]

## 2011-04-02 NOTE — H&P (Signed)
NAME:  Garrett Salas, Garrett Salas NO.:  [id number]  MEDICAL RECORD NO.:  [id number]           PATIENT TYPE:  I  LOCATION:  2101                         FACILITY:  [facility]  PHYSICIAN:  Arlyss Repress, MD        DATE OF BIRTH:  1951-01-01  DATE OF ADMISSION:  03/10/2011 DATE OF DISCHARGE:                             HISTORY & PHYSICAL   CHIEF COMPLAINT:  Chest pain.  HISTORY OF PRESENT ILLNESS:  [age]-year-old male with end-stage renal disease, on hemodialysis Tuesday, Thursday, Saturday, apparently had a feeling of chest pressure "like an elephant sitting on my chest" while at rest without any radiation that was associated with shortness of breath.  It lasted for about 4-5 hours until he has treated with sublingual nitroglycerin.  The patient denies any fever, chills, cough, nausea, vomiting, palpitations, lower extremity edema.  EKG apparently showed normal sinus rhythm at 100, normal axis, poor R-wave progression, no ST, T segment changes consistent with acute ischemia.  Initial set of cardiac markers was negative.  BNP was elevated at 173,000.  Chest x-ray apparently per ED showed congestive heart failure.  The patient will be admitted for workup of chest pain and CHF.  PAST MEDICAL HISTORY: 1. End-stage renal disease, on hemodialysis Tuesday, Thursday,     Saturday. 2. Diabetes type 2. 3. Hypertension. 4. Hyperlipidemia.  PAST SURGICAL HISTORY:  AV fistula x2.  SOCIAL HISTORY:  The patient does not smoke or drink.  FAMILY HISTORY:  There is no family history of kidney failure.  His mother died at age 36 of breast cancer.  Father died at unknown age.  ALLERGIES:  No known drug allergies.  MEDICATIONS: 1. Gabapentin 400 mg p.o. b.i.d. 2. Simvastatin 40 mg p.o. at bedtime. 3. Renvela 800 mg 2 p.o. t.i.d. a.c. meals. 4. Lantus. 5. 70/30 sliding scale.  REVIEW OF SYSTEMS:  Negative for all 10-organ systems except for pertinent positives as stated  above.  PHYSICAL EXAM:  VITAL SIGNS:  Temperature afebrile, pulse 83, blood pressure 180/68, pulse ox 99% on room air. HEENT:  Anicteric. Neck:  No JVD, no bruit. HEART:  Regular rate and rhythm, S1-S2.  No murmurs, gallops, or rubs. LUNGS:  Clear to auscultation bilaterally. ABDOMEN:  Soft, nontender, nondistended.  Positive bowel sounds. Extremities:  No cyanosis, clubbing, or edema. SKIN:  No rashes. LYMPH NODES:  No adenopathy. NEUROLOGIC:  Nonfocal.  LABORATORY DATA:  WBC 6.9, hemoglobin 10.7, platelet count 169.  ABG; pH 7.350, pCO2 53, pO2 84.0.  Sodium 139, potassium 4.6, BUN 44, creatinine 8.63, calcium 8.8.  Albumin 4.3, AST 37, ALT 35, alk phos 129 (high). Pro-BNP 173,000  ASSESSMENT AND PLAN: 1. Chest pain:  The patient was placed on telemetry.  We will check     CK, CK-MB, troponin I q.6 h. x3 sets.  Check a lipid panel.  The     patient will be placed on aspirin, nitro drip.  Continue on     simvastatin and we will use carvedilol 6.25 mg p.o. b.i.d. 2. ?  Congestive heart failure:  Check cardiac 2-D echo. 3. Diabetes type 2.  Fingerstick  blood sugars a.c. and at bedtime,     NovoLog sensitive sliding scale.  Please find out home dose of     Lantus and sliding scale insulin. 4. Hypertension, uncontrolled.  Start on carvedilol and continue     nitroglycerin drip, add hydralazine 10 mg IV q.6 h. p.r.n. systolic     blood pressure greater than 150. 5. Diabetic neuropathy:  Continue gabapentin. 6. Deep venous thrombosis prophylaxis.  Heparin.     Arlyss Repress, MD     JYK/MEDQ  D:  03/10/2011  T:  03/10/2011  Job:  [id number]  cc:   [facility]  Electronically Signed by Jani Gravel MD on 04/02/2011 06:51:22 AM

## 2011-04-06 NOTE — Progress Notes (Addendum)
NUCLEAR STUDY ROUTED TO DR. CRENSHAW.

## 2011-04-06 NOTE — Progress Notes (Signed)
THE ORDERING/REFERRING MD WAS Arlyss Repress, MD. Steele Sizer SAW THE PATIENT AT [facility] 03/10/11. NOTE: DR. Percival Spanish HAS NEVER SEEN THIS PATIENT. THE PATIENT IS TO FOLLOW UP WITH [facility].  Krishna Dancel, RT-N

## 2011-04-08 NOTE — Progress Notes (Signed)
Patient aware of myoview results. He wants to f/u in the [place] office so I have transferred him to Dr. Tyrell Antonio scheduler to set this up within the next 1-2 weeks.

## 2011-04-27 ENCOUNTER — Encounter: Payer: Self-pay | Admitting: Cardiovascular Disease

## 2011-04-27 ENCOUNTER — Ambulatory Visit (INDEPENDENT_AMBULATORY_CARE_PROVIDER_SITE_OTHER): Payer: Medicare Other | Admitting: Cardiovascular Disease

## 2011-04-27 ENCOUNTER — Encounter: Payer: Self-pay | Admitting: *Deleted

## 2011-04-27 DIAGNOSIS — I509 Heart failure, unspecified: Secondary | ICD-10-CM

## 2011-04-27 DIAGNOSIS — F17200 Nicotine dependence, unspecified, uncomplicated: Secondary | ICD-10-CM

## 2011-04-27 DIAGNOSIS — Z720 Tobacco use: Secondary | ICD-10-CM

## 2011-04-27 NOTE — Assessment & Plan Note (Signed)
The patient's current symptoms include dyspnea with minimal activities. He also did have multiple occasions of chest tightness which is concerning for possible underlying coronary artery disease. Although his nuclear stress test showed no evidence of perfusion defects, the drop in his ejection fraction to 39% is concerning and might indicate a stunned myocardium with balanced ischemia. Due to his symptoms, stress test findings as well as multiple risk factors for coronary artery disease, I recommend proceeding with cardiac catheterization for definitive diagnosis. Would also perform a right heart catheterization due to cardiomyopathy. Risks, benefits and alternatives were discussed with the patient. In the meantime would continue with aspirin daily. I will consider changing his beta blocker to carvedilol. Will also likely need to add a small dose ACE inhibitor or an ARB.

## 2011-04-27 NOTE — Progress Notes (Signed)
HPI  This is a 60 year old man who is referred for evaluation of cardiomyopathy. The patient has no previous cardiac history. He presented in April of this year to [facility] with substernal chest tightness and and increased dyspnea. The chest discomfort responded to nitroglycerin. He ruled out for myocardial infarction. He had an echocardiogram done which showed normal LV systolic function. He was discharged home to have an outpatient stress test. This was done a few weeks later which showed no evidence of ischemia or infarcts but there was a global hypokinesis of the left ventricle with an ejection fraction of 59%. The patient has extensive medical problems that include prolonged history of diabetes with multiple complications that include nephropathy leading to end-stage renal disease currently on hemodialysis, neuropathy and retinopathy. The patient does complain of dyspnea with minimal activities which has been getting worse. He occasionally gets tightness in his chest with this. There has been no palpitations, syncope or presyncope.  No Known Allergies   No current outpatient prescriptions on file prior to visit.     Past Medical History  Diagnosis Date  . CHF (congestive heart failure)     EF : 39%  . Diabetes mellitus     Type 2. Diagnosed in mid 74s. Currently insulin requiring  . Hyperlipidemia   . Hypertension   . ESRD (end stage renal disease) on dialysis     due to diabetic nephropathy. T/Th/Sat  . Tobacco abuse   . Diabetic neuropathy   . Diabetic retinopathy     legally blind     Past Surgical History  Procedure Date  . None      Family History  Problem Relation Age of Onset  . Heart disease    . Alcohol abuse       History   Social History  . Marital Status: Married    Spouse Name: N/A    Number of Children: 1  . Years of Education: N/A   Occupational History  . retired    Social History Main Topics  . Smoking status: Current Everyday  Smoker -- 0.5 packs/day for 30 years  . Smokeless tobacco: Not on file  . Alcohol Use: No  . Drug Use: Yes    Special: Marijuana  . Sexually Active: Not on file   Other Topics Concern  . Not on file   Social History Narrative  . No narrative on file     ROS Constitutional: Negative for fever, chills, diaphoresis, activity change, appetite change and fatigue.  HENT: Negative for hearing loss, nosebleeds, congestion, sore throat, facial swelling, drooling, trouble swallowing, neck pain, voice change, sinus pressure and tinnitus.  Eyes: Negative for photophobia, pain, discharge and visual disturbance.  Respiratory: Negative for apnea, cough,and wheezing.  Cardiovascular: Negative for , palpitations and leg swelling.  Gastrointestinal: Negative for nausea, vomiting, abdominal pain, diarrhea, constipation, blood in stool and abdominal distention.  Genitourinary: Negative for dysuria, urgency, frequency, hematuria and decreased urine volume.  Musculoskeletal: Negative for myalgias, back pain, joint swelling, arthralgias and gait problem.  Skin: Negative for color change, pallor, rash and wound.  Neurological: Negative for dizziness, tremors, seizures, syncope, speech difficulty, weakness, light-headedness, numbness and headaches.  Psychiatric/Behavioral: Negative for suicidal ideas, hallucinations, behavioral problems and agitation. The patient is not nervous/anxious.     PHYSICAL EXAM   BP 151/75  Pulse 53  Ht 5\' 4"  (1.626 m)  Wt 125 lb (56.7 kg)  BMI 21.46 kg/m2  SpO2 97%  Constitutional: He is oriented to  person, place, and time. He appears well-developed and well-nourished. No distress.  HENT: No nasal discharge.  Head: Normocephalic and atraumatic.  Eyes: Pupils are equal, round, and reactive to light. Right eye exhibits no discharge. Left eye exhibits no discharge.  Neck: Normal range of motion. Neck supple. Mild JVD present. No thyromegaly present.  Cardiovascular:  Normal rate, regular rhythm, normal heart sounds and intact distal pulses. Exam reveals no gallop and no friction rub.  No murmur heard. Femoral pulse is normal on the right and mildly decreased on the left. Pulmonary/Chest: Effort normal and breath sounds normal. No stridor. No respiratory distress. He has no wheezes. He has no rales. He exhibits no tenderness.  Abdominal: Soft. Bowel sounds are normal. He exhibits no distension. There is no tenderness. There is no rebound and no guarding.  Musculoskeletal: Normal range of motion. He exhibits no edema and no tenderness. There are fistulas in both upper extremities.  Neurological: He is alert and oriented to person, place, and time. Coordination normal.  Skin: Skin is warm and dry. No rash noted. He is not diaphoretic. No erythema. No pallor.  Psychiatric: He has a normal mood and affect. His behavior is normal. Judgment and thought content normal.       ASSESSMENT AND PLAN

## 2011-04-27 NOTE — Patient Instructions (Signed)
Your physician recommends that you schedule a follow-up appointment in: after your cath   Your physician has requested that you have a cardiac catheterization. Cardiac catheterization is used to diagnose and/or treat various heart conditions. Doctors may recommend this procedure for a number of different reasons. The most common reason is to evaluate chest pain. Chest pain can be a symptom of coronary artery disease (CAD), and cardiac catheterization can show whether plaque is narrowing or blocking your heart's arteries. This procedure is also used to evaluate the valves, as well as measure the blood flow and oxygen levels in different parts of your heart. For further information please visit [email or web address]. Please follow instruction sheet, as given.

## 2011-04-27 NOTE — Assessment & Plan Note (Signed)
The patient has no desire to quit smoking at this time. He knows that consequences. He mentions that this is the only pleasure left for him.

## 2011-05-06 ENCOUNTER — Inpatient Hospital Stay (HOSPITAL_BASED_OUTPATIENT_CLINIC_OR_DEPARTMENT_OTHER)
Admission: RE | Admit: 2011-05-06 | Discharge: 2011-05-06 | Disposition: A | Discharge status: Home / Self Care | Payer: Medicare Other | Source: Ambulatory Visit | Attending: Cardiovascular Disease | Admitting: Cardiovascular Disease

## 2011-05-06 DIAGNOSIS — E119 Type 2 diabetes mellitus without complications: Secondary | ICD-10-CM | POA: Insufficient documentation

## 2011-05-06 DIAGNOSIS — E785 Hyperlipidemia, unspecified: Secondary | ICD-10-CM | POA: Insufficient documentation

## 2011-05-06 DIAGNOSIS — R079 Chest pain, unspecified: Secondary | ICD-10-CM | POA: Insufficient documentation

## 2011-05-06 DIAGNOSIS — Z992 Dependence on renal dialysis: Secondary | ICD-10-CM | POA: Insufficient documentation

## 2011-05-06 DIAGNOSIS — N186 End stage renal disease: Secondary | ICD-10-CM | POA: Insufficient documentation

## 2011-05-06 DIAGNOSIS — I120 Hypertensive chronic kidney disease with stage 5 chronic kidney disease or end stage renal disease: Secondary | ICD-10-CM | POA: Insufficient documentation

## 2011-05-06 HISTORY — PX: CARDIAC CATHETERIZATION: SHX172

## 2011-05-06 LAB — POCT I-STAT GLUCOSE: Operator id: 144271

## 2011-05-13 NOTE — Cardiovascular Report (Signed)
NAME:  LUI, WAGEMAN NO.:  [id number]  MEDICAL RECORD NO.:  [id number]  LOCATION:                                 FACILITY:  PHYSICIAN:  Kathlyn Sacramento, MD     DATE OF BIRTH:  1950/12/09  DATE OF PROCEDURE: DATE OF DISCHARGE:                           CARDIAC CATHETERIZATION   PRIMARY CARE PHYSICIAN:  Jenean Lindau, MD  PROCEDURES PERFORMED: 1. Left heart catheterization 2. Coronary angiography. 3. Left ventricular angiography.  INDICATIONS AND CLINICAL HISTORY:  This is a 60 year old male with prolonged history of diabetes, end-stage renal disease on hemodialysis, hypertension, and hyperlipidemia.  The patient was evaluated recently for chest pain.  His echocardiogram was unremarkable.  He underwent an outpatient nuclear stress test which showed no evidence of perfusion defect.  However, during stress, his ejection fraction dropped to 39%. Due to his symptoms and multiple risk factors for coronary artery disease, I was concerned about possible underlying 3-vessel disease, especially with a drop in ejection fraction during stress.  Cardiac catheterization was recommended.  Risks, benefits, and alternatives were discussed with the patient.  STUDY DETAILS:  A standard informed consent was obtained.  The right groin area was prepped in a sterile fashion.  It was anesthetized with 1% lidocaine.  A 4-French sheath was placed in the right femoral artery. I used a Wholey wire due to calcifications in the vessel.  Coronary angiography was performed with a JL-4, 3D RC, and a pigtail catheter. All catheter exchanges were done over the wire.  The patient tolerated the procedure well with no immediate complications.  STUDY FINDINGS:  Hemodynamic findings:  Left ventricular pressure is 120/12 with a left ventricular end-diastolic pressure of 15 mmHg. Central aortic pressure is 120/56 with a mean gradient of 80 mmHg.  No significant gradient was noted across the  aortic valve. Left ventricular angiography:  This showed normal LV systolic function with an estimated ejection fraction of 60% with normal wall motion.  No significant mitral regurgitation is noted.  CORONARY ANGIOGRAPHY:  Left main coronary artery:  The vessel is normal in size and free of significant disease. Left anterior descending artery:  The vessel is normal in size and mildly tortuous.  It has no significant coronary artery disease although it does have mild calcifications and minor irregularities.  The first diagonal is normal in size and free of significant disease.  Second diagonal is medium size and third diagonal is small in size.  All are free of significant disease. Left circumflex artery:  The vessel is normal in size and nondominant. It is free of any significant disease. Right coronary artery:  The vessel is normal in size and dominant.  It has mild calcifications and minor irregularities, but otherwise no obstructive disease.  STUDY CONCLUSIONS: 1. Normal LV systolic function with an estimated ejection fraction of     60%. 2. Borderline elevated left ventricular end-diastolic pressure. 3. Mild luminal irregularities without any evidence of obstructive     coronary artery disease.     Kathlyn Sacramento, MD     MA/MEDQ  D:  05/06/2011  T:  05/07/2011  Job:  [id number]  cc:   Jenean Lindau, MD  Electronically Signed by Kathlyn Sacramento MD on 05/13/2011 02:43:39 PM

## 2011-05-14 ENCOUNTER — Encounter: Payer: Self-pay | Admitting: Cardiovascular Disease

## 2011-05-18 ENCOUNTER — Encounter: Payer: Self-pay | Admitting: Cardiovascular Disease

## 2011-05-18 ENCOUNTER — Ambulatory Visit (INDEPENDENT_AMBULATORY_CARE_PROVIDER_SITE_OTHER): Payer: Medicare Other | Admitting: Cardiovascular Disease

## 2011-05-18 DIAGNOSIS — R0609 Other forms of dyspnea: Secondary | ICD-10-CM

## 2011-05-18 DIAGNOSIS — I10 Essential (primary) hypertension: Secondary | ICD-10-CM | POA: Insufficient documentation

## 2011-05-18 DIAGNOSIS — R0600 Dyspnea, unspecified: Secondary | ICD-10-CM | POA: Insufficient documentation

## 2011-05-18 NOTE — Progress Notes (Signed)
HPI  Garrett Salas is a 60 year old gentleman who is here today for a followup visit after his recent cardiac catheterization. He was evaluated for dyspnea and chest pain with an abnormal nuclear stress test which showed an ejection fraction of 39% without significant perfusion defects. The patient had prolonged history of diabetes with end organ damage. I proceeded with cardiac catheterization which surprisingly showed no significant coronary artery disease, normal LV systolic function and only mildly elevated left ventricular end-diastolic pressure. The patient currently is not having any chest pain. He woke up this morning feeling tired and slightly confused. He checked his blood sugar and it was 118. He did not take his insulin. He has no other neurologic symptoms. He is actually alert oriented x3.  No Known Allergies   Current Outpatient Prescriptions on File Prior to Visit  Medication Sig Dispense Refill  . aspirin 81 MG tablet Take 81 mg by mouth daily.        Marland Kitchen gabapentin (NEURONTIN) 400 MG tablet Take 400 mg by mouth 2 (two) times daily.        . insulin aspart (NOVOLOG) 100 UNIT/ML injection as directed.        . insulin glargine (LANTUS) 100 UNIT/ML injection Inject 25 Units into the skin at bedtime.        . metoCLOPramide (REGLAN) 10 MG tablet Take 10 mg by mouth 4 (four) times daily -  before meals and at bedtime.        . nebivolol (BYSTOLIC) 10 MG tablet Take 5 mg by mouth daily.       . sevelamer (RENVELA) 800 MG tablet Take 800 mg by mouth 3 (three) times daily with meals.        . simvastatin (ZOCOR) 40 MG tablet Take 40 mg by mouth at bedtime.        Marland Kitchen DISCONTD: b complex-vitamin c-folic acid (NEPHRO-VITE) 0.8 MG TABS Take 0.8 mg by mouth at bedtime.           Past Medical History  Diagnosis Date  . Diabetes mellitus     Type 2. Diagnosed in mid 75s. Currently insulin requiring  . Hyperlipidemia   . ESRD (end stage renal disease) on dialysis     due to diabetic  nephropathy. T/Th/Sat  . Tobacco abuse   . Diabetic neuropathy   . Diabetic retinopathy     legally blind  . Hypertension      Past Surgical History  Procedure Date  . None   . Cardiac catheterization 05/06/2011    no significant CAD, EF 55-60%, LVEDP :15      Family History  Problem Relation Age of Onset  . Heart disease    . Alcohol abuse       History   Social History  . Marital Status: Married    Spouse Name: N/A    Number of Children: 1  . Years of Education: N/A   Occupational History  . retired    Social History Main Topics  . Smoking status: Current Everyday Smoker -- 0.5 packs/day for 30 years  . Smokeless tobacco: Not on file  . Alcohol Use: No  . Drug Use: Yes    Special: Marijuana  . Sexually Active: Not on file   Other Topics Concern  . Not on file   Social History Narrative  . No narrative on file       PHYSICAL EXAM   BP 187/82  Pulse 55  Ht 5\' 4"  (1.626 m)  Wt 124 lb (56.246 kg)  BMI 21.28 kg/m2  SpO2 97%  Constitutional: He is oriented to person, place, and time. He appears well-developed and well-nourished. No distress.  HENT: No nasal discharge.  Head: Normocephalic and atraumatic.  Eyes: Pupils are equal, round, and reactive to light. Right eye exhibits no discharge. Left eye exhibits no discharge.  Neck: Normal range of motion. Neck supple. No JVD present. No thyromegaly present.  Cardiovascular: Normal rate, regular rhythm, normal heart sounds and intact distal pulses. Exam reveals no gallop and no friction rub.  No murmur heard.  Pulmonary/Chest: Effort normal and breath sounds normal. No stridor. No respiratory distress. He has no wheezes. He has no rales. He exhibits no tenderness.  Abdominal: Soft. Bowel sounds are normal. He exhibits no distension. There is no tenderness. There is no rebound and no guarding.  Musculoskeletal: Normal range of motion. He exhibits no edema and no tenderness.  Neurological: He is alert  and oriented to person, place, and time. Coordination normal.  Skin: Skin is warm and dry. No rash noted. He is not diaphoretic. No erythema. No pallor.  Psychiatric: He has a normal mood and affect. His behavior is normal. Judgment and thought content normal.  The right groin is intact with no evidence of hematoma.       ASSESSMENT AND PLAN

## 2011-05-18 NOTE — Patient Instructions (Signed)
Your physician recommends that you schedule a follow-up appointment in: as needed  

## 2011-05-18 NOTE — Assessment & Plan Note (Signed)
The patient is not having any more chest pain. His dyspnea is likely multifactorial. His cardiac catheterization overall was unremarkable. Nuclear stress test was likely a false-positive in terms of low ejection fraction. I recommend continuing treating his risk factors. He can followup with me as needed. The patient is complaining of weakness and confusion but he seems to be alert and oriented x3. His vital signs seems to be stable. His blood sugar was not low. I advised him to seek medical attention in the emergency room if symptoms don't improve or worsen.

## 2011-05-18 NOTE — Assessment & Plan Note (Signed)
His blood pressure is elevated but seems to run borderline low during dialysis. If this continues to be an issue, consider adding amlodipine.

## 2011-08-13 LAB — POCT I-STAT, CHEM 8
Calcium, Ion: 0.99 — ABNORMAL LOW
HCT: 49
TCO2: 30

## 2011-08-13 LAB — CBC
HCT: 43.4
Hemoglobin: 15.2
MCHC: 35
MCV: 92
RDW: 15.8 — ABNORMAL HIGH

## 2011-08-13 LAB — POCT I-STAT 4, (NA,K, GLUC, HGB,HCT)
Operator id: 181601
Sodium: 132 — ABNORMAL LOW

## 2011-08-13 LAB — DIFFERENTIAL
Basophils Absolute: 0.1
Basophils Relative: 1
Eosinophils Relative: 7 — ABNORMAL HIGH
Monocytes Absolute: 1.1 — ABNORMAL HIGH

## 2011-08-13 LAB — POCT CARDIAC MARKERS: Operator id: 294501

## 2011-08-17 LAB — GLUCOSE, CAPILLARY
Glucose-Capillary: 109 — ABNORMAL HIGH
Glucose-Capillary: 164 — ABNORMAL HIGH
Glucose-Capillary: 91

## 2011-08-17 LAB — POCT I-STAT 4, (NA,K, GLUC, HGB,HCT)
Glucose, Bld: 532
HCT: 45
Hemoglobin: 15.3
Sodium: 128 — ABNORMAL LOW

## 2011-11-19 DIAGNOSIS — N186 End stage renal disease: Secondary | ICD-10-CM | POA: Diagnosis not present

## 2011-11-19 DIAGNOSIS — E119 Type 2 diabetes mellitus without complications: Secondary | ICD-10-CM | POA: Diagnosis not present

## 2011-11-19 DIAGNOSIS — N2581 Secondary hyperparathyroidism of renal origin: Secondary | ICD-10-CM | POA: Diagnosis not present

## 2011-11-27 DIAGNOSIS — I509 Heart failure, unspecified: Secondary | ICD-10-CM | POA: Diagnosis not present

## 2011-11-27 DIAGNOSIS — I10 Essential (primary) hypertension: Secondary | ICD-10-CM | POA: Diagnosis not present

## 2011-11-27 DIAGNOSIS — N186 End stage renal disease: Secondary | ICD-10-CM | POA: Diagnosis not present

## 2011-11-27 DIAGNOSIS — E1129 Type 2 diabetes mellitus with other diabetic kidney complication: Secondary | ICD-10-CM | POA: Diagnosis not present

## 2011-12-10 DIAGNOSIS — E119 Type 2 diabetes mellitus without complications: Secondary | ICD-10-CM | POA: Diagnosis not present

## 2011-12-17 DIAGNOSIS — N186 End stage renal disease: Secondary | ICD-10-CM | POA: Diagnosis not present

## 2011-12-19 DIAGNOSIS — N186 End stage renal disease: Secondary | ICD-10-CM | POA: Diagnosis not present

## 2011-12-19 DIAGNOSIS — E119 Type 2 diabetes mellitus without complications: Secondary | ICD-10-CM | POA: Diagnosis not present

## 2011-12-19 DIAGNOSIS — N2581 Secondary hyperparathyroidism of renal origin: Secondary | ICD-10-CM | POA: Diagnosis not present

## 2011-12-22 DIAGNOSIS — N2581 Secondary hyperparathyroidism of renal origin: Secondary | ICD-10-CM | POA: Diagnosis not present

## 2011-12-22 DIAGNOSIS — N186 End stage renal disease: Secondary | ICD-10-CM | POA: Diagnosis not present

## 2011-12-22 DIAGNOSIS — E119 Type 2 diabetes mellitus without complications: Secondary | ICD-10-CM | POA: Diagnosis not present

## 2011-12-24 DIAGNOSIS — E119 Type 2 diabetes mellitus without complications: Secondary | ICD-10-CM | POA: Diagnosis not present

## 2011-12-24 DIAGNOSIS — N186 End stage renal disease: Secondary | ICD-10-CM | POA: Diagnosis not present

## 2011-12-24 DIAGNOSIS — N2581 Secondary hyperparathyroidism of renal origin: Secondary | ICD-10-CM | POA: Diagnosis not present

## 2011-12-26 DIAGNOSIS — N2581 Secondary hyperparathyroidism of renal origin: Secondary | ICD-10-CM | POA: Diagnosis not present

## 2011-12-26 DIAGNOSIS — E119 Type 2 diabetes mellitus without complications: Secondary | ICD-10-CM | POA: Diagnosis not present

## 2011-12-26 DIAGNOSIS — N186 End stage renal disease: Secondary | ICD-10-CM | POA: Diagnosis not present

## 2011-12-29 DIAGNOSIS — N2581 Secondary hyperparathyroidism of renal origin: Secondary | ICD-10-CM | POA: Diagnosis not present

## 2011-12-29 DIAGNOSIS — E119 Type 2 diabetes mellitus without complications: Secondary | ICD-10-CM | POA: Diagnosis not present

## 2011-12-29 DIAGNOSIS — N186 End stage renal disease: Secondary | ICD-10-CM | POA: Diagnosis not present

## 2011-12-31 DIAGNOSIS — N2581 Secondary hyperparathyroidism of renal origin: Secondary | ICD-10-CM | POA: Diagnosis not present

## 2011-12-31 DIAGNOSIS — N186 End stage renal disease: Secondary | ICD-10-CM | POA: Diagnosis not present

## 2011-12-31 DIAGNOSIS — E119 Type 2 diabetes mellitus without complications: Secondary | ICD-10-CM | POA: Diagnosis not present

## 2012-01-02 DIAGNOSIS — N2581 Secondary hyperparathyroidism of renal origin: Secondary | ICD-10-CM | POA: Diagnosis not present

## 2012-01-02 DIAGNOSIS — E119 Type 2 diabetes mellitus without complications: Secondary | ICD-10-CM | POA: Diagnosis not present

## 2012-01-02 DIAGNOSIS — N186 End stage renal disease: Secondary | ICD-10-CM | POA: Diagnosis not present

## 2012-01-05 DIAGNOSIS — N186 End stage renal disease: Secondary | ICD-10-CM | POA: Diagnosis not present

## 2012-01-05 DIAGNOSIS — E119 Type 2 diabetes mellitus without complications: Secondary | ICD-10-CM | POA: Diagnosis not present

## 2012-01-05 DIAGNOSIS — N2581 Secondary hyperparathyroidism of renal origin: Secondary | ICD-10-CM | POA: Diagnosis not present

## 2012-01-07 DIAGNOSIS — N186 End stage renal disease: Secondary | ICD-10-CM | POA: Diagnosis not present

## 2012-01-07 DIAGNOSIS — N2581 Secondary hyperparathyroidism of renal origin: Secondary | ICD-10-CM | POA: Diagnosis not present

## 2012-01-07 DIAGNOSIS — E119 Type 2 diabetes mellitus without complications: Secondary | ICD-10-CM | POA: Diagnosis not present

## 2012-01-09 DIAGNOSIS — E119 Type 2 diabetes mellitus without complications: Secondary | ICD-10-CM | POA: Diagnosis not present

## 2012-01-09 DIAGNOSIS — N186 End stage renal disease: Secondary | ICD-10-CM | POA: Diagnosis not present

## 2012-01-09 DIAGNOSIS — N2581 Secondary hyperparathyroidism of renal origin: Secondary | ICD-10-CM | POA: Diagnosis not present

## 2012-01-12 DIAGNOSIS — N2581 Secondary hyperparathyroidism of renal origin: Secondary | ICD-10-CM | POA: Diagnosis not present

## 2012-01-12 DIAGNOSIS — E119 Type 2 diabetes mellitus without complications: Secondary | ICD-10-CM | POA: Diagnosis not present

## 2012-01-12 DIAGNOSIS — N186 End stage renal disease: Secondary | ICD-10-CM | POA: Diagnosis not present

## 2012-01-14 DIAGNOSIS — N186 End stage renal disease: Secondary | ICD-10-CM | POA: Diagnosis not present

## 2012-01-14 DIAGNOSIS — E119 Type 2 diabetes mellitus without complications: Secondary | ICD-10-CM | POA: Diagnosis not present

## 2012-01-14 DIAGNOSIS — N2581 Secondary hyperparathyroidism of renal origin: Secondary | ICD-10-CM | POA: Diagnosis not present

## 2012-01-16 DIAGNOSIS — N186 End stage renal disease: Secondary | ICD-10-CM | POA: Diagnosis not present

## 2012-01-16 DIAGNOSIS — N2581 Secondary hyperparathyroidism of renal origin: Secondary | ICD-10-CM | POA: Diagnosis not present

## 2012-01-16 DIAGNOSIS — E119 Type 2 diabetes mellitus without complications: Secondary | ICD-10-CM | POA: Diagnosis not present

## 2012-01-16 DIAGNOSIS — D509 Iron deficiency anemia, unspecified: Secondary | ICD-10-CM | POA: Diagnosis not present

## 2012-01-16 DIAGNOSIS — N039 Chronic nephritic syndrome with unspecified morphologic changes: Secondary | ICD-10-CM | POA: Diagnosis not present

## 2012-01-21 DIAGNOSIS — N2581 Secondary hyperparathyroidism of renal origin: Secondary | ICD-10-CM | POA: Diagnosis not present

## 2012-01-21 DIAGNOSIS — N186 End stage renal disease: Secondary | ICD-10-CM | POA: Diagnosis not present

## 2012-01-21 DIAGNOSIS — D631 Anemia in chronic kidney disease: Secondary | ICD-10-CM | POA: Diagnosis not present

## 2012-01-21 DIAGNOSIS — E119 Type 2 diabetes mellitus without complications: Secondary | ICD-10-CM | POA: Diagnosis not present

## 2012-01-21 DIAGNOSIS — D509 Iron deficiency anemia, unspecified: Secondary | ICD-10-CM | POA: Diagnosis not present

## 2012-01-23 DIAGNOSIS — N2581 Secondary hyperparathyroidism of renal origin: Secondary | ICD-10-CM | POA: Diagnosis not present

## 2012-01-23 DIAGNOSIS — E119 Type 2 diabetes mellitus without complications: Secondary | ICD-10-CM | POA: Diagnosis not present

## 2012-01-23 DIAGNOSIS — N186 End stage renal disease: Secondary | ICD-10-CM | POA: Diagnosis not present

## 2012-01-23 DIAGNOSIS — D509 Iron deficiency anemia, unspecified: Secondary | ICD-10-CM | POA: Diagnosis not present

## 2012-01-23 DIAGNOSIS — N039 Chronic nephritic syndrome with unspecified morphologic changes: Secondary | ICD-10-CM | POA: Diagnosis not present

## 2012-01-26 DIAGNOSIS — E119 Type 2 diabetes mellitus without complications: Secondary | ICD-10-CM | POA: Diagnosis not present

## 2012-01-26 DIAGNOSIS — N2581 Secondary hyperparathyroidism of renal origin: Secondary | ICD-10-CM | POA: Diagnosis not present

## 2012-01-26 DIAGNOSIS — D631 Anemia in chronic kidney disease: Secondary | ICD-10-CM | POA: Diagnosis not present

## 2012-01-26 DIAGNOSIS — D509 Iron deficiency anemia, unspecified: Secondary | ICD-10-CM | POA: Diagnosis not present

## 2012-01-26 DIAGNOSIS — N186 End stage renal disease: Secondary | ICD-10-CM | POA: Diagnosis not present

## 2012-01-28 DIAGNOSIS — N186 End stage renal disease: Secondary | ICD-10-CM | POA: Diagnosis not present

## 2012-01-28 DIAGNOSIS — N2581 Secondary hyperparathyroidism of renal origin: Secondary | ICD-10-CM | POA: Diagnosis not present

## 2012-01-28 DIAGNOSIS — D509 Iron deficiency anemia, unspecified: Secondary | ICD-10-CM | POA: Diagnosis not present

## 2012-01-28 DIAGNOSIS — E119 Type 2 diabetes mellitus without complications: Secondary | ICD-10-CM | POA: Diagnosis not present

## 2012-01-28 DIAGNOSIS — D631 Anemia in chronic kidney disease: Secondary | ICD-10-CM | POA: Diagnosis not present

## 2012-01-30 DIAGNOSIS — D509 Iron deficiency anemia, unspecified: Secondary | ICD-10-CM | POA: Diagnosis not present

## 2012-01-30 DIAGNOSIS — N2581 Secondary hyperparathyroidism of renal origin: Secondary | ICD-10-CM | POA: Diagnosis not present

## 2012-01-30 DIAGNOSIS — E119 Type 2 diabetes mellitus without complications: Secondary | ICD-10-CM | POA: Diagnosis not present

## 2012-01-30 DIAGNOSIS — D631 Anemia in chronic kidney disease: Secondary | ICD-10-CM | POA: Diagnosis not present

## 2012-01-30 DIAGNOSIS — N186 End stage renal disease: Secondary | ICD-10-CM | POA: Diagnosis not present

## 2012-02-02 DIAGNOSIS — E119 Type 2 diabetes mellitus without complications: Secondary | ICD-10-CM | POA: Diagnosis not present

## 2012-02-02 DIAGNOSIS — N2581 Secondary hyperparathyroidism of renal origin: Secondary | ICD-10-CM | POA: Diagnosis not present

## 2012-02-02 DIAGNOSIS — N039 Chronic nephritic syndrome with unspecified morphologic changes: Secondary | ICD-10-CM | POA: Diagnosis not present

## 2012-02-02 DIAGNOSIS — N186 End stage renal disease: Secondary | ICD-10-CM | POA: Diagnosis not present

## 2012-02-02 DIAGNOSIS — D509 Iron deficiency anemia, unspecified: Secondary | ICD-10-CM | POA: Diagnosis not present

## 2012-02-04 DIAGNOSIS — D631 Anemia in chronic kidney disease: Secondary | ICD-10-CM | POA: Diagnosis not present

## 2012-02-04 DIAGNOSIS — N186 End stage renal disease: Secondary | ICD-10-CM | POA: Diagnosis not present

## 2012-02-04 DIAGNOSIS — D509 Iron deficiency anemia, unspecified: Secondary | ICD-10-CM | POA: Diagnosis not present

## 2012-02-04 DIAGNOSIS — N2581 Secondary hyperparathyroidism of renal origin: Secondary | ICD-10-CM | POA: Diagnosis not present

## 2012-02-04 DIAGNOSIS — E119 Type 2 diabetes mellitus without complications: Secondary | ICD-10-CM | POA: Diagnosis not present

## 2012-02-06 DIAGNOSIS — E119 Type 2 diabetes mellitus without complications: Secondary | ICD-10-CM | POA: Diagnosis not present

## 2012-02-06 DIAGNOSIS — N186 End stage renal disease: Secondary | ICD-10-CM | POA: Diagnosis not present

## 2012-02-06 DIAGNOSIS — N2581 Secondary hyperparathyroidism of renal origin: Secondary | ICD-10-CM | POA: Diagnosis not present

## 2012-02-06 DIAGNOSIS — N039 Chronic nephritic syndrome with unspecified morphologic changes: Secondary | ICD-10-CM | POA: Diagnosis not present

## 2012-02-06 DIAGNOSIS — D509 Iron deficiency anemia, unspecified: Secondary | ICD-10-CM | POA: Diagnosis not present

## 2012-02-09 DIAGNOSIS — N186 End stage renal disease: Secondary | ICD-10-CM | POA: Diagnosis not present

## 2012-02-09 DIAGNOSIS — E119 Type 2 diabetes mellitus without complications: Secondary | ICD-10-CM | POA: Diagnosis not present

## 2012-02-09 DIAGNOSIS — D509 Iron deficiency anemia, unspecified: Secondary | ICD-10-CM | POA: Diagnosis not present

## 2012-02-09 DIAGNOSIS — N2581 Secondary hyperparathyroidism of renal origin: Secondary | ICD-10-CM | POA: Diagnosis not present

## 2012-02-09 DIAGNOSIS — D631 Anemia in chronic kidney disease: Secondary | ICD-10-CM | POA: Diagnosis not present

## 2012-02-11 DIAGNOSIS — E119 Type 2 diabetes mellitus without complications: Secondary | ICD-10-CM | POA: Diagnosis not present

## 2012-02-11 DIAGNOSIS — N039 Chronic nephritic syndrome with unspecified morphologic changes: Secondary | ICD-10-CM | POA: Diagnosis not present

## 2012-02-11 DIAGNOSIS — N186 End stage renal disease: Secondary | ICD-10-CM | POA: Diagnosis not present

## 2012-02-11 DIAGNOSIS — N2581 Secondary hyperparathyroidism of renal origin: Secondary | ICD-10-CM | POA: Diagnosis not present

## 2012-02-11 DIAGNOSIS — D509 Iron deficiency anemia, unspecified: Secondary | ICD-10-CM | POA: Diagnosis not present

## 2012-02-13 DIAGNOSIS — N2581 Secondary hyperparathyroidism of renal origin: Secondary | ICD-10-CM | POA: Diagnosis not present

## 2012-02-13 DIAGNOSIS — E119 Type 2 diabetes mellitus without complications: Secondary | ICD-10-CM | POA: Diagnosis not present

## 2012-02-13 DIAGNOSIS — D631 Anemia in chronic kidney disease: Secondary | ICD-10-CM | POA: Diagnosis not present

## 2012-02-13 DIAGNOSIS — D509 Iron deficiency anemia, unspecified: Secondary | ICD-10-CM | POA: Diagnosis not present

## 2012-02-13 DIAGNOSIS — N186 End stage renal disease: Secondary | ICD-10-CM | POA: Diagnosis not present

## 2012-02-14 DIAGNOSIS — N186 End stage renal disease: Secondary | ICD-10-CM | POA: Diagnosis not present

## 2012-02-15 DIAGNOSIS — R197 Diarrhea, unspecified: Secondary | ICD-10-CM | POA: Diagnosis not present

## 2012-02-15 DIAGNOSIS — R1310 Dysphagia, unspecified: Secondary | ICD-10-CM | POA: Diagnosis not present

## 2012-02-16 DIAGNOSIS — N186 End stage renal disease: Secondary | ICD-10-CM | POA: Diagnosis not present

## 2012-02-16 DIAGNOSIS — D509 Iron deficiency anemia, unspecified: Secondary | ICD-10-CM | POA: Diagnosis not present

## 2012-02-16 DIAGNOSIS — N2581 Secondary hyperparathyroidism of renal origin: Secondary | ICD-10-CM | POA: Diagnosis not present

## 2012-02-16 DIAGNOSIS — E119 Type 2 diabetes mellitus without complications: Secondary | ICD-10-CM | POA: Diagnosis not present

## 2012-03-02 DIAGNOSIS — K228 Other specified diseases of esophagus: Secondary | ICD-10-CM | POA: Diagnosis not present

## 2012-03-02 DIAGNOSIS — R1314 Dysphagia, pharyngoesophageal phase: Secondary | ICD-10-CM | POA: Diagnosis not present

## 2012-03-02 DIAGNOSIS — R1310 Dysphagia, unspecified: Secondary | ICD-10-CM | POA: Diagnosis not present

## 2012-03-02 DIAGNOSIS — T182XXA Foreign body in stomach, initial encounter: Secondary | ICD-10-CM | POA: Diagnosis not present

## 2012-03-10 DIAGNOSIS — E119 Type 2 diabetes mellitus without complications: Secondary | ICD-10-CM | POA: Diagnosis not present

## 2012-03-15 DIAGNOSIS — N186 End stage renal disease: Secondary | ICD-10-CM | POA: Diagnosis not present

## 2012-03-17 DIAGNOSIS — N2581 Secondary hyperparathyroidism of renal origin: Secondary | ICD-10-CM | POA: Diagnosis not present

## 2012-03-17 DIAGNOSIS — E119 Type 2 diabetes mellitus without complications: Secondary | ICD-10-CM | POA: Diagnosis not present

## 2012-03-17 DIAGNOSIS — D509 Iron deficiency anemia, unspecified: Secondary | ICD-10-CM | POA: Diagnosis not present

## 2012-03-17 DIAGNOSIS — N186 End stage renal disease: Secondary | ICD-10-CM | POA: Diagnosis not present

## 2012-03-28 DIAGNOSIS — N186 End stage renal disease: Secondary | ICD-10-CM | POA: Diagnosis not present

## 2012-03-28 DIAGNOSIS — E1165 Type 2 diabetes mellitus with hyperglycemia: Secondary | ICD-10-CM | POA: Diagnosis not present

## 2012-03-28 DIAGNOSIS — K219 Gastro-esophageal reflux disease without esophagitis: Secondary | ICD-10-CM | POA: Diagnosis not present

## 2012-03-28 DIAGNOSIS — F17200 Nicotine dependence, unspecified, uncomplicated: Secondary | ICD-10-CM | POA: Diagnosis not present

## 2012-03-28 DIAGNOSIS — I10 Essential (primary) hypertension: Secondary | ICD-10-CM | POA: Diagnosis not present

## 2012-03-28 DIAGNOSIS — E785 Hyperlipidemia, unspecified: Secondary | ICD-10-CM | POA: Diagnosis not present

## 2012-04-01 DIAGNOSIS — K224 Dyskinesia of esophagus: Secondary | ICD-10-CM | POA: Diagnosis not present

## 2012-04-01 DIAGNOSIS — Z86010 Personal history of colonic polyps: Secondary | ICD-10-CM | POA: Diagnosis not present

## 2012-04-15 DIAGNOSIS — N186 End stage renal disease: Secondary | ICD-10-CM | POA: Diagnosis not present

## 2012-04-16 DIAGNOSIS — N186 End stage renal disease: Secondary | ICD-10-CM | POA: Diagnosis not present

## 2012-04-16 DIAGNOSIS — E119 Type 2 diabetes mellitus without complications: Secondary | ICD-10-CM | POA: Diagnosis not present

## 2012-04-16 DIAGNOSIS — D509 Iron deficiency anemia, unspecified: Secondary | ICD-10-CM | POA: Diagnosis not present

## 2012-04-16 DIAGNOSIS — N2581 Secondary hyperparathyroidism of renal origin: Secondary | ICD-10-CM | POA: Diagnosis not present

## 2012-04-19 DIAGNOSIS — E119 Type 2 diabetes mellitus without complications: Secondary | ICD-10-CM | POA: Diagnosis not present

## 2012-04-19 DIAGNOSIS — D509 Iron deficiency anemia, unspecified: Secondary | ICD-10-CM | POA: Diagnosis not present

## 2012-04-19 DIAGNOSIS — N186 End stage renal disease: Secondary | ICD-10-CM | POA: Diagnosis not present

## 2012-04-19 DIAGNOSIS — N2581 Secondary hyperparathyroidism of renal origin: Secondary | ICD-10-CM | POA: Diagnosis not present

## 2012-04-21 DIAGNOSIS — E119 Type 2 diabetes mellitus without complications: Secondary | ICD-10-CM | POA: Diagnosis not present

## 2012-04-21 DIAGNOSIS — D509 Iron deficiency anemia, unspecified: Secondary | ICD-10-CM | POA: Diagnosis not present

## 2012-04-21 DIAGNOSIS — N186 End stage renal disease: Secondary | ICD-10-CM | POA: Diagnosis not present

## 2012-04-21 DIAGNOSIS — N2581 Secondary hyperparathyroidism of renal origin: Secondary | ICD-10-CM | POA: Diagnosis not present

## 2012-04-23 DIAGNOSIS — E119 Type 2 diabetes mellitus without complications: Secondary | ICD-10-CM | POA: Diagnosis not present

## 2012-04-23 DIAGNOSIS — N2581 Secondary hyperparathyroidism of renal origin: Secondary | ICD-10-CM | POA: Diagnosis not present

## 2012-04-23 DIAGNOSIS — N186 End stage renal disease: Secondary | ICD-10-CM | POA: Diagnosis not present

## 2012-04-23 DIAGNOSIS — D509 Iron deficiency anemia, unspecified: Secondary | ICD-10-CM | POA: Diagnosis not present

## 2012-04-26 DIAGNOSIS — D509 Iron deficiency anemia, unspecified: Secondary | ICD-10-CM | POA: Diagnosis not present

## 2012-04-26 DIAGNOSIS — E119 Type 2 diabetes mellitus without complications: Secondary | ICD-10-CM | POA: Diagnosis not present

## 2012-04-26 DIAGNOSIS — N2581 Secondary hyperparathyroidism of renal origin: Secondary | ICD-10-CM | POA: Diagnosis not present

## 2012-04-26 DIAGNOSIS — N186 End stage renal disease: Secondary | ICD-10-CM | POA: Diagnosis not present

## 2012-04-28 DIAGNOSIS — D509 Iron deficiency anemia, unspecified: Secondary | ICD-10-CM | POA: Diagnosis not present

## 2012-04-28 DIAGNOSIS — E119 Type 2 diabetes mellitus without complications: Secondary | ICD-10-CM | POA: Diagnosis not present

## 2012-04-28 DIAGNOSIS — N2581 Secondary hyperparathyroidism of renal origin: Secondary | ICD-10-CM | POA: Diagnosis not present

## 2012-04-28 DIAGNOSIS — N186 End stage renal disease: Secondary | ICD-10-CM | POA: Diagnosis not present

## 2012-04-30 DIAGNOSIS — N186 End stage renal disease: Secondary | ICD-10-CM | POA: Diagnosis not present

## 2012-04-30 DIAGNOSIS — E119 Type 2 diabetes mellitus without complications: Secondary | ICD-10-CM | POA: Diagnosis not present

## 2012-04-30 DIAGNOSIS — N2581 Secondary hyperparathyroidism of renal origin: Secondary | ICD-10-CM | POA: Diagnosis not present

## 2012-04-30 DIAGNOSIS — D509 Iron deficiency anemia, unspecified: Secondary | ICD-10-CM | POA: Diagnosis not present

## 2012-05-02 DIAGNOSIS — D126 Benign neoplasm of colon, unspecified: Secondary | ICD-10-CM | POA: Diagnosis not present

## 2012-05-02 DIAGNOSIS — Z86010 Personal history of colonic polyps: Secondary | ICD-10-CM | POA: Diagnosis not present

## 2012-05-03 DIAGNOSIS — N186 End stage renal disease: Secondary | ICD-10-CM | POA: Diagnosis not present

## 2012-05-03 DIAGNOSIS — E119 Type 2 diabetes mellitus without complications: Secondary | ICD-10-CM | POA: Diagnosis not present

## 2012-05-03 DIAGNOSIS — N2581 Secondary hyperparathyroidism of renal origin: Secondary | ICD-10-CM | POA: Diagnosis not present

## 2012-05-03 DIAGNOSIS — D509 Iron deficiency anemia, unspecified: Secondary | ICD-10-CM | POA: Diagnosis not present

## 2012-05-05 DIAGNOSIS — E119 Type 2 diabetes mellitus without complications: Secondary | ICD-10-CM | POA: Diagnosis not present

## 2012-05-05 DIAGNOSIS — D509 Iron deficiency anemia, unspecified: Secondary | ICD-10-CM | POA: Diagnosis not present

## 2012-05-05 DIAGNOSIS — N186 End stage renal disease: Secondary | ICD-10-CM | POA: Diagnosis not present

## 2012-05-05 DIAGNOSIS — N2581 Secondary hyperparathyroidism of renal origin: Secondary | ICD-10-CM | POA: Diagnosis not present

## 2012-05-07 DIAGNOSIS — N2581 Secondary hyperparathyroidism of renal origin: Secondary | ICD-10-CM | POA: Diagnosis not present

## 2012-05-07 DIAGNOSIS — N186 End stage renal disease: Secondary | ICD-10-CM | POA: Diagnosis not present

## 2012-05-07 DIAGNOSIS — D509 Iron deficiency anemia, unspecified: Secondary | ICD-10-CM | POA: Diagnosis not present

## 2012-05-07 DIAGNOSIS — E119 Type 2 diabetes mellitus without complications: Secondary | ICD-10-CM | POA: Diagnosis not present

## 2012-05-10 DIAGNOSIS — N2581 Secondary hyperparathyroidism of renal origin: Secondary | ICD-10-CM | POA: Diagnosis not present

## 2012-05-10 DIAGNOSIS — D509 Iron deficiency anemia, unspecified: Secondary | ICD-10-CM | POA: Diagnosis not present

## 2012-05-10 DIAGNOSIS — E119 Type 2 diabetes mellitus without complications: Secondary | ICD-10-CM | POA: Diagnosis not present

## 2012-05-10 DIAGNOSIS — N186 End stage renal disease: Secondary | ICD-10-CM | POA: Diagnosis not present

## 2012-05-12 DIAGNOSIS — D509 Iron deficiency anemia, unspecified: Secondary | ICD-10-CM | POA: Diagnosis not present

## 2012-05-12 DIAGNOSIS — N2581 Secondary hyperparathyroidism of renal origin: Secondary | ICD-10-CM | POA: Diagnosis not present

## 2012-05-12 DIAGNOSIS — E119 Type 2 diabetes mellitus without complications: Secondary | ICD-10-CM | POA: Diagnosis not present

## 2012-05-12 DIAGNOSIS — N186 End stage renal disease: Secondary | ICD-10-CM | POA: Diagnosis not present

## 2012-05-14 DIAGNOSIS — N2581 Secondary hyperparathyroidism of renal origin: Secondary | ICD-10-CM | POA: Diagnosis not present

## 2012-05-14 DIAGNOSIS — N186 End stage renal disease: Secondary | ICD-10-CM | POA: Diagnosis not present

## 2012-05-14 DIAGNOSIS — D509 Iron deficiency anemia, unspecified: Secondary | ICD-10-CM | POA: Diagnosis not present

## 2012-05-14 DIAGNOSIS — E119 Type 2 diabetes mellitus without complications: Secondary | ICD-10-CM | POA: Diagnosis not present

## 2012-05-15 DIAGNOSIS — N186 End stage renal disease: Secondary | ICD-10-CM | POA: Diagnosis not present

## 2012-05-17 DIAGNOSIS — N2581 Secondary hyperparathyroidism of renal origin: Secondary | ICD-10-CM | POA: Diagnosis not present

## 2012-05-17 DIAGNOSIS — N186 End stage renal disease: Secondary | ICD-10-CM | POA: Diagnosis not present

## 2012-05-17 DIAGNOSIS — D509 Iron deficiency anemia, unspecified: Secondary | ICD-10-CM | POA: Diagnosis not present

## 2012-05-17 DIAGNOSIS — E119 Type 2 diabetes mellitus without complications: Secondary | ICD-10-CM | POA: Diagnosis not present

## 2012-05-19 DIAGNOSIS — N2581 Secondary hyperparathyroidism of renal origin: Secondary | ICD-10-CM | POA: Diagnosis not present

## 2012-05-19 DIAGNOSIS — N186 End stage renal disease: Secondary | ICD-10-CM | POA: Diagnosis not present

## 2012-05-19 DIAGNOSIS — D509 Iron deficiency anemia, unspecified: Secondary | ICD-10-CM | POA: Diagnosis not present

## 2012-05-19 DIAGNOSIS — E119 Type 2 diabetes mellitus without complications: Secondary | ICD-10-CM | POA: Diagnosis not present

## 2012-05-20 DIAGNOSIS — N186 End stage renal disease: Secondary | ICD-10-CM | POA: Diagnosis not present

## 2012-05-20 DIAGNOSIS — E119 Type 2 diabetes mellitus without complications: Secondary | ICD-10-CM | POA: Diagnosis not present

## 2012-05-20 DIAGNOSIS — N2581 Secondary hyperparathyroidism of renal origin: Secondary | ICD-10-CM | POA: Diagnosis not present

## 2012-05-20 DIAGNOSIS — D509 Iron deficiency anemia, unspecified: Secondary | ICD-10-CM | POA: Diagnosis not present

## 2012-05-24 DIAGNOSIS — N186 End stage renal disease: Secondary | ICD-10-CM | POA: Diagnosis not present

## 2012-05-24 DIAGNOSIS — N2581 Secondary hyperparathyroidism of renal origin: Secondary | ICD-10-CM | POA: Diagnosis not present

## 2012-05-24 DIAGNOSIS — D509 Iron deficiency anemia, unspecified: Secondary | ICD-10-CM | POA: Diagnosis not present

## 2012-05-24 DIAGNOSIS — E119 Type 2 diabetes mellitus without complications: Secondary | ICD-10-CM | POA: Diagnosis not present

## 2012-05-26 DIAGNOSIS — D509 Iron deficiency anemia, unspecified: Secondary | ICD-10-CM | POA: Diagnosis not present

## 2012-05-26 DIAGNOSIS — N186 End stage renal disease: Secondary | ICD-10-CM | POA: Diagnosis not present

## 2012-05-26 DIAGNOSIS — N2581 Secondary hyperparathyroidism of renal origin: Secondary | ICD-10-CM | POA: Diagnosis not present

## 2012-05-26 DIAGNOSIS — E119 Type 2 diabetes mellitus without complications: Secondary | ICD-10-CM | POA: Diagnosis not present

## 2012-05-28 DIAGNOSIS — E119 Type 2 diabetes mellitus without complications: Secondary | ICD-10-CM | POA: Diagnosis not present

## 2012-05-28 DIAGNOSIS — D509 Iron deficiency anemia, unspecified: Secondary | ICD-10-CM | POA: Diagnosis not present

## 2012-05-28 DIAGNOSIS — N2581 Secondary hyperparathyroidism of renal origin: Secondary | ICD-10-CM | POA: Diagnosis not present

## 2012-05-28 DIAGNOSIS — N186 End stage renal disease: Secondary | ICD-10-CM | POA: Diagnosis not present

## 2012-05-31 DIAGNOSIS — E119 Type 2 diabetes mellitus without complications: Secondary | ICD-10-CM | POA: Diagnosis not present

## 2012-05-31 DIAGNOSIS — D509 Iron deficiency anemia, unspecified: Secondary | ICD-10-CM | POA: Diagnosis not present

## 2012-05-31 DIAGNOSIS — N186 End stage renal disease: Secondary | ICD-10-CM | POA: Diagnosis not present

## 2012-05-31 DIAGNOSIS — N2581 Secondary hyperparathyroidism of renal origin: Secondary | ICD-10-CM | POA: Diagnosis not present

## 2012-06-02 DIAGNOSIS — D509 Iron deficiency anemia, unspecified: Secondary | ICD-10-CM | POA: Diagnosis not present

## 2012-06-02 DIAGNOSIS — N2581 Secondary hyperparathyroidism of renal origin: Secondary | ICD-10-CM | POA: Diagnosis not present

## 2012-06-02 DIAGNOSIS — E119 Type 2 diabetes mellitus without complications: Secondary | ICD-10-CM | POA: Diagnosis not present

## 2012-06-02 DIAGNOSIS — N186 End stage renal disease: Secondary | ICD-10-CM | POA: Diagnosis not present

## 2012-06-04 DIAGNOSIS — N2581 Secondary hyperparathyroidism of renal origin: Secondary | ICD-10-CM | POA: Diagnosis not present

## 2012-06-04 DIAGNOSIS — N186 End stage renal disease: Secondary | ICD-10-CM | POA: Diagnosis not present

## 2012-06-04 DIAGNOSIS — D509 Iron deficiency anemia, unspecified: Secondary | ICD-10-CM | POA: Diagnosis not present

## 2012-06-04 DIAGNOSIS — E119 Type 2 diabetes mellitus without complications: Secondary | ICD-10-CM | POA: Diagnosis not present

## 2012-06-07 DIAGNOSIS — E119 Type 2 diabetes mellitus without complications: Secondary | ICD-10-CM | POA: Diagnosis not present

## 2012-06-07 DIAGNOSIS — D509 Iron deficiency anemia, unspecified: Secondary | ICD-10-CM | POA: Diagnosis not present

## 2012-06-07 DIAGNOSIS — N2581 Secondary hyperparathyroidism of renal origin: Secondary | ICD-10-CM | POA: Diagnosis not present

## 2012-06-07 DIAGNOSIS — N186 End stage renal disease: Secondary | ICD-10-CM | POA: Diagnosis not present

## 2012-06-09 DIAGNOSIS — D509 Iron deficiency anemia, unspecified: Secondary | ICD-10-CM | POA: Diagnosis not present

## 2012-06-09 DIAGNOSIS — E119 Type 2 diabetes mellitus without complications: Secondary | ICD-10-CM | POA: Diagnosis not present

## 2012-06-09 DIAGNOSIS — N186 End stage renal disease: Secondary | ICD-10-CM | POA: Diagnosis not present

## 2012-06-09 DIAGNOSIS — N2581 Secondary hyperparathyroidism of renal origin: Secondary | ICD-10-CM | POA: Diagnosis not present

## 2012-06-11 DIAGNOSIS — E119 Type 2 diabetes mellitus without complications: Secondary | ICD-10-CM | POA: Diagnosis not present

## 2012-06-11 DIAGNOSIS — D509 Iron deficiency anemia, unspecified: Secondary | ICD-10-CM | POA: Diagnosis not present

## 2012-06-11 DIAGNOSIS — N186 End stage renal disease: Secondary | ICD-10-CM | POA: Diagnosis not present

## 2012-06-11 DIAGNOSIS — N2581 Secondary hyperparathyroidism of renal origin: Secondary | ICD-10-CM | POA: Diagnosis not present

## 2012-06-14 DIAGNOSIS — N2581 Secondary hyperparathyroidism of renal origin: Secondary | ICD-10-CM | POA: Diagnosis not present

## 2012-06-14 DIAGNOSIS — E119 Type 2 diabetes mellitus without complications: Secondary | ICD-10-CM | POA: Diagnosis not present

## 2012-06-14 DIAGNOSIS — D509 Iron deficiency anemia, unspecified: Secondary | ICD-10-CM | POA: Diagnosis not present

## 2012-06-14 DIAGNOSIS — N186 End stage renal disease: Secondary | ICD-10-CM | POA: Diagnosis not present

## 2012-06-15 DIAGNOSIS — N186 End stage renal disease: Secondary | ICD-10-CM | POA: Diagnosis not present

## 2012-06-16 DIAGNOSIS — D509 Iron deficiency anemia, unspecified: Secondary | ICD-10-CM | POA: Diagnosis not present

## 2012-06-16 DIAGNOSIS — N186 End stage renal disease: Secondary | ICD-10-CM | POA: Diagnosis not present

## 2012-06-16 DIAGNOSIS — N2581 Secondary hyperparathyroidism of renal origin: Secondary | ICD-10-CM | POA: Diagnosis not present

## 2012-06-16 DIAGNOSIS — E119 Type 2 diabetes mellitus without complications: Secondary | ICD-10-CM | POA: Diagnosis not present

## 2012-06-18 DIAGNOSIS — N2581 Secondary hyperparathyroidism of renal origin: Secondary | ICD-10-CM | POA: Diagnosis not present

## 2012-06-18 DIAGNOSIS — D509 Iron deficiency anemia, unspecified: Secondary | ICD-10-CM | POA: Diagnosis not present

## 2012-06-18 DIAGNOSIS — N186 End stage renal disease: Secondary | ICD-10-CM | POA: Diagnosis not present

## 2012-06-18 DIAGNOSIS — E119 Type 2 diabetes mellitus without complications: Secondary | ICD-10-CM | POA: Diagnosis not present

## 2012-06-21 DIAGNOSIS — N2581 Secondary hyperparathyroidism of renal origin: Secondary | ICD-10-CM | POA: Diagnosis not present

## 2012-06-21 DIAGNOSIS — D509 Iron deficiency anemia, unspecified: Secondary | ICD-10-CM | POA: Diagnosis not present

## 2012-06-21 DIAGNOSIS — N186 End stage renal disease: Secondary | ICD-10-CM | POA: Diagnosis not present

## 2012-06-21 DIAGNOSIS — E119 Type 2 diabetes mellitus without complications: Secondary | ICD-10-CM | POA: Diagnosis not present

## 2012-06-23 DIAGNOSIS — N2581 Secondary hyperparathyroidism of renal origin: Secondary | ICD-10-CM | POA: Diagnosis not present

## 2012-06-23 DIAGNOSIS — D509 Iron deficiency anemia, unspecified: Secondary | ICD-10-CM | POA: Diagnosis not present

## 2012-06-23 DIAGNOSIS — E119 Type 2 diabetes mellitus without complications: Secondary | ICD-10-CM | POA: Diagnosis not present

## 2012-06-23 DIAGNOSIS — N186 End stage renal disease: Secondary | ICD-10-CM | POA: Diagnosis not present

## 2012-06-25 DIAGNOSIS — N186 End stage renal disease: Secondary | ICD-10-CM | POA: Diagnosis not present

## 2012-06-25 DIAGNOSIS — N2581 Secondary hyperparathyroidism of renal origin: Secondary | ICD-10-CM | POA: Diagnosis not present

## 2012-06-25 DIAGNOSIS — E119 Type 2 diabetes mellitus without complications: Secondary | ICD-10-CM | POA: Diagnosis not present

## 2012-06-25 DIAGNOSIS — D509 Iron deficiency anemia, unspecified: Secondary | ICD-10-CM | POA: Diagnosis not present

## 2012-06-28 DIAGNOSIS — D509 Iron deficiency anemia, unspecified: Secondary | ICD-10-CM | POA: Diagnosis not present

## 2012-06-28 DIAGNOSIS — N186 End stage renal disease: Secondary | ICD-10-CM | POA: Diagnosis not present

## 2012-06-28 DIAGNOSIS — E119 Type 2 diabetes mellitus without complications: Secondary | ICD-10-CM | POA: Diagnosis not present

## 2012-06-28 DIAGNOSIS — N2581 Secondary hyperparathyroidism of renal origin: Secondary | ICD-10-CM | POA: Diagnosis not present

## 2012-06-30 DIAGNOSIS — D509 Iron deficiency anemia, unspecified: Secondary | ICD-10-CM | POA: Diagnosis not present

## 2012-06-30 DIAGNOSIS — N186 End stage renal disease: Secondary | ICD-10-CM | POA: Diagnosis not present

## 2012-06-30 DIAGNOSIS — N2581 Secondary hyperparathyroidism of renal origin: Secondary | ICD-10-CM | POA: Diagnosis not present

## 2012-06-30 DIAGNOSIS — E119 Type 2 diabetes mellitus without complications: Secondary | ICD-10-CM | POA: Diagnosis not present

## 2012-07-02 DIAGNOSIS — D509 Iron deficiency anemia, unspecified: Secondary | ICD-10-CM | POA: Diagnosis not present

## 2012-07-02 DIAGNOSIS — N2581 Secondary hyperparathyroidism of renal origin: Secondary | ICD-10-CM | POA: Diagnosis not present

## 2012-07-02 DIAGNOSIS — E119 Type 2 diabetes mellitus without complications: Secondary | ICD-10-CM | POA: Diagnosis not present

## 2012-07-02 DIAGNOSIS — N186 End stage renal disease: Secondary | ICD-10-CM | POA: Diagnosis not present

## 2012-07-05 DIAGNOSIS — N186 End stage renal disease: Secondary | ICD-10-CM | POA: Diagnosis not present

## 2012-07-05 DIAGNOSIS — D509 Iron deficiency anemia, unspecified: Secondary | ICD-10-CM | POA: Diagnosis not present

## 2012-07-05 DIAGNOSIS — N2581 Secondary hyperparathyroidism of renal origin: Secondary | ICD-10-CM | POA: Diagnosis not present

## 2012-07-05 DIAGNOSIS — E119 Type 2 diabetes mellitus without complications: Secondary | ICD-10-CM | POA: Diagnosis not present

## 2012-07-07 DIAGNOSIS — N2581 Secondary hyperparathyroidism of renal origin: Secondary | ICD-10-CM | POA: Diagnosis not present

## 2012-07-07 DIAGNOSIS — D509 Iron deficiency anemia, unspecified: Secondary | ICD-10-CM | POA: Diagnosis not present

## 2012-07-07 DIAGNOSIS — E119 Type 2 diabetes mellitus without complications: Secondary | ICD-10-CM | POA: Diagnosis not present

## 2012-07-07 DIAGNOSIS — N186 End stage renal disease: Secondary | ICD-10-CM | POA: Diagnosis not present

## 2012-07-08 DIAGNOSIS — N186 End stage renal disease: Secondary | ICD-10-CM | POA: Diagnosis not present

## 2012-07-08 DIAGNOSIS — E785 Hyperlipidemia, unspecified: Secondary | ICD-10-CM | POA: Diagnosis not present

## 2012-07-08 DIAGNOSIS — I10 Essential (primary) hypertension: Secondary | ICD-10-CM | POA: Diagnosis not present

## 2012-07-08 DIAGNOSIS — E1129 Type 2 diabetes mellitus with other diabetic kidney complication: Secondary | ICD-10-CM | POA: Diagnosis not present

## 2012-07-08 DIAGNOSIS — E1165 Type 2 diabetes mellitus with hyperglycemia: Secondary | ICD-10-CM | POA: Diagnosis not present

## 2012-07-09 DIAGNOSIS — D509 Iron deficiency anemia, unspecified: Secondary | ICD-10-CM | POA: Diagnosis not present

## 2012-07-09 DIAGNOSIS — E119 Type 2 diabetes mellitus without complications: Secondary | ICD-10-CM | POA: Diagnosis not present

## 2012-07-09 DIAGNOSIS — N2581 Secondary hyperparathyroidism of renal origin: Secondary | ICD-10-CM | POA: Diagnosis not present

## 2012-07-09 DIAGNOSIS — N186 End stage renal disease: Secondary | ICD-10-CM | POA: Diagnosis not present

## 2012-07-12 DIAGNOSIS — E119 Type 2 diabetes mellitus without complications: Secondary | ICD-10-CM | POA: Diagnosis not present

## 2012-07-12 DIAGNOSIS — D509 Iron deficiency anemia, unspecified: Secondary | ICD-10-CM | POA: Diagnosis not present

## 2012-07-12 DIAGNOSIS — N2581 Secondary hyperparathyroidism of renal origin: Secondary | ICD-10-CM | POA: Diagnosis not present

## 2012-07-12 DIAGNOSIS — N186 End stage renal disease: Secondary | ICD-10-CM | POA: Diagnosis not present

## 2012-07-14 DIAGNOSIS — E119 Type 2 diabetes mellitus without complications: Secondary | ICD-10-CM | POA: Diagnosis not present

## 2012-07-14 DIAGNOSIS — D509 Iron deficiency anemia, unspecified: Secondary | ICD-10-CM | POA: Diagnosis not present

## 2012-07-14 DIAGNOSIS — N186 End stage renal disease: Secondary | ICD-10-CM | POA: Diagnosis not present

## 2012-07-14 DIAGNOSIS — N2581 Secondary hyperparathyroidism of renal origin: Secondary | ICD-10-CM | POA: Diagnosis not present

## 2012-07-16 DIAGNOSIS — N2581 Secondary hyperparathyroidism of renal origin: Secondary | ICD-10-CM | POA: Diagnosis not present

## 2012-07-16 DIAGNOSIS — E119 Type 2 diabetes mellitus without complications: Secondary | ICD-10-CM | POA: Diagnosis not present

## 2012-07-16 DIAGNOSIS — N186 End stage renal disease: Secondary | ICD-10-CM | POA: Diagnosis not present

## 2012-07-16 DIAGNOSIS — D509 Iron deficiency anemia, unspecified: Secondary | ICD-10-CM | POA: Diagnosis not present

## 2012-07-19 DIAGNOSIS — N2581 Secondary hyperparathyroidism of renal origin: Secondary | ICD-10-CM | POA: Diagnosis not present

## 2012-07-19 DIAGNOSIS — E119 Type 2 diabetes mellitus without complications: Secondary | ICD-10-CM | POA: Diagnosis not present

## 2012-07-19 DIAGNOSIS — D509 Iron deficiency anemia, unspecified: Secondary | ICD-10-CM | POA: Diagnosis not present

## 2012-07-19 DIAGNOSIS — D631 Anemia in chronic kidney disease: Secondary | ICD-10-CM | POA: Diagnosis not present

## 2012-07-19 DIAGNOSIS — N186 End stage renal disease: Secondary | ICD-10-CM | POA: Diagnosis not present

## 2012-08-15 DIAGNOSIS — N186 End stage renal disease: Secondary | ICD-10-CM | POA: Diagnosis not present

## 2012-08-16 DIAGNOSIS — D631 Anemia in chronic kidney disease: Secondary | ICD-10-CM | POA: Diagnosis not present

## 2012-08-16 DIAGNOSIS — N186 End stage renal disease: Secondary | ICD-10-CM | POA: Diagnosis not present

## 2012-08-16 DIAGNOSIS — D509 Iron deficiency anemia, unspecified: Secondary | ICD-10-CM | POA: Diagnosis not present

## 2012-08-16 DIAGNOSIS — N2581 Secondary hyperparathyroidism of renal origin: Secondary | ICD-10-CM | POA: Diagnosis not present

## 2012-08-16 DIAGNOSIS — E119 Type 2 diabetes mellitus without complications: Secondary | ICD-10-CM | POA: Diagnosis not present

## 2012-09-01 DIAGNOSIS — E119 Type 2 diabetes mellitus without complications: Secondary | ICD-10-CM | POA: Diagnosis not present

## 2012-09-15 DIAGNOSIS — N186 End stage renal disease: Secondary | ICD-10-CM | POA: Diagnosis not present

## 2012-09-17 DIAGNOSIS — N2581 Secondary hyperparathyroidism of renal origin: Secondary | ICD-10-CM | POA: Diagnosis not present

## 2012-09-17 DIAGNOSIS — Z23 Encounter for immunization: Secondary | ICD-10-CM | POA: Diagnosis not present

## 2012-09-17 DIAGNOSIS — N186 End stage renal disease: Secondary | ICD-10-CM | POA: Diagnosis not present

## 2012-10-07 DIAGNOSIS — E785 Hyperlipidemia, unspecified: Secondary | ICD-10-CM | POA: Diagnosis not present

## 2012-10-07 DIAGNOSIS — I10 Essential (primary) hypertension: Secondary | ICD-10-CM | POA: Diagnosis not present

## 2012-10-07 DIAGNOSIS — N186 End stage renal disease: Secondary | ICD-10-CM | POA: Diagnosis not present

## 2012-10-07 DIAGNOSIS — E1165 Type 2 diabetes mellitus with hyperglycemia: Secondary | ICD-10-CM | POA: Diagnosis not present

## 2012-10-07 DIAGNOSIS — IMO0002 Reserved for concepts with insufficient information to code with codable children: Secondary | ICD-10-CM | POA: Diagnosis not present

## 2012-10-07 DIAGNOSIS — E1129 Type 2 diabetes mellitus with other diabetic kidney complication: Secondary | ICD-10-CM | POA: Diagnosis not present

## 2012-10-15 DIAGNOSIS — N186 End stage renal disease: Secondary | ICD-10-CM | POA: Diagnosis not present

## 2012-10-18 DIAGNOSIS — N186 End stage renal disease: Secondary | ICD-10-CM | POA: Diagnosis not present

## 2012-10-18 DIAGNOSIS — N2581 Secondary hyperparathyroidism of renal origin: Secondary | ICD-10-CM | POA: Diagnosis not present

## 2012-11-15 DIAGNOSIS — N186 End stage renal disease: Secondary | ICD-10-CM | POA: Diagnosis not present

## 2012-11-17 DIAGNOSIS — N2581 Secondary hyperparathyroidism of renal origin: Secondary | ICD-10-CM | POA: Diagnosis not present

## 2012-11-17 DIAGNOSIS — N186 End stage renal disease: Secondary | ICD-10-CM | POA: Diagnosis not present

## 2012-12-16 DIAGNOSIS — N186 End stage renal disease: Secondary | ICD-10-CM | POA: Diagnosis not present

## 2012-12-17 DIAGNOSIS — N2581 Secondary hyperparathyroidism of renal origin: Secondary | ICD-10-CM | POA: Diagnosis not present

## 2012-12-17 DIAGNOSIS — D631 Anemia in chronic kidney disease: Secondary | ICD-10-CM | POA: Diagnosis not present

## 2012-12-17 DIAGNOSIS — N186 End stage renal disease: Secondary | ICD-10-CM | POA: Diagnosis not present

## 2013-01-13 DIAGNOSIS — N186 End stage renal disease: Secondary | ICD-10-CM | POA: Diagnosis not present

## 2013-01-14 DIAGNOSIS — D631 Anemia in chronic kidney disease: Secondary | ICD-10-CM | POA: Diagnosis not present

## 2013-01-14 DIAGNOSIS — N2581 Secondary hyperparathyroidism of renal origin: Secondary | ICD-10-CM | POA: Diagnosis not present

## 2013-01-14 DIAGNOSIS — D509 Iron deficiency anemia, unspecified: Secondary | ICD-10-CM | POA: Diagnosis not present

## 2013-01-14 DIAGNOSIS — N186 End stage renal disease: Secondary | ICD-10-CM | POA: Diagnosis not present

## 2013-02-13 DIAGNOSIS — N186 End stage renal disease: Secondary | ICD-10-CM | POA: Diagnosis not present

## 2013-02-14 DIAGNOSIS — D631 Anemia in chronic kidney disease: Secondary | ICD-10-CM | POA: Diagnosis not present

## 2013-02-14 DIAGNOSIS — N2581 Secondary hyperparathyroidism of renal origin: Secondary | ICD-10-CM | POA: Diagnosis not present

## 2013-02-14 DIAGNOSIS — D509 Iron deficiency anemia, unspecified: Secondary | ICD-10-CM | POA: Diagnosis not present

## 2013-02-14 DIAGNOSIS — N186 End stage renal disease: Secondary | ICD-10-CM | POA: Diagnosis not present

## 2013-02-15 DIAGNOSIS — R29898 Other symptoms and signs involving the musculoskeletal system: Secondary | ICD-10-CM | POA: Diagnosis not present

## 2013-02-15 DIAGNOSIS — I509 Heart failure, unspecified: Secondary | ICD-10-CM | POA: Diagnosis not present

## 2013-02-15 DIAGNOSIS — E1165 Type 2 diabetes mellitus with hyperglycemia: Secondary | ICD-10-CM | POA: Diagnosis not present

## 2013-02-15 DIAGNOSIS — IMO0002 Reserved for concepts with insufficient information to code with codable children: Secondary | ICD-10-CM | POA: Diagnosis not present

## 2013-02-15 DIAGNOSIS — E1129 Type 2 diabetes mellitus with other diabetic kidney complication: Secondary | ICD-10-CM | POA: Diagnosis not present

## 2013-03-15 DIAGNOSIS — N186 End stage renal disease: Secondary | ICD-10-CM | POA: Diagnosis not present

## 2013-03-16 DIAGNOSIS — D631 Anemia in chronic kidney disease: Secondary | ICD-10-CM | POA: Diagnosis not present

## 2013-03-16 DIAGNOSIS — N2581 Secondary hyperparathyroidism of renal origin: Secondary | ICD-10-CM | POA: Diagnosis not present

## 2013-03-16 DIAGNOSIS — N186 End stage renal disease: Secondary | ICD-10-CM | POA: Diagnosis not present

## 2013-04-03 DIAGNOSIS — IMO0002 Reserved for concepts with insufficient information to code with codable children: Secondary | ICD-10-CM | POA: Diagnosis not present

## 2013-04-03 DIAGNOSIS — T819XXA Unspecified complication of procedure, initial encounter: Secondary | ICD-10-CM | POA: Diagnosis not present

## 2013-04-03 DIAGNOSIS — S41109A Unspecified open wound of unspecified upper arm, initial encounter: Secondary | ICD-10-CM | POA: Diagnosis not present

## 2013-04-03 DIAGNOSIS — T82898A Other specified complication of vascular prosthetic devices, implants and grafts, initial encounter: Secondary | ICD-10-CM | POA: Diagnosis not present

## 2013-04-03 DIAGNOSIS — F17200 Nicotine dependence, unspecified, uncomplicated: Secondary | ICD-10-CM | POA: Diagnosis not present

## 2013-04-03 DIAGNOSIS — E119 Type 2 diabetes mellitus without complications: Secondary | ICD-10-CM | POA: Diagnosis not present

## 2013-04-03 DIAGNOSIS — T82598A Other mechanical complication of other cardiac and vascular devices and implants, initial encounter: Secondary | ICD-10-CM | POA: Diagnosis not present

## 2013-04-03 DIAGNOSIS — E109 Type 1 diabetes mellitus without complications: Secondary | ICD-10-CM | POA: Diagnosis not present

## 2013-04-15 DIAGNOSIS — N186 End stage renal disease: Secondary | ICD-10-CM | POA: Diagnosis not present

## 2013-04-18 DIAGNOSIS — N186 End stage renal disease: Secondary | ICD-10-CM | POA: Diagnosis not present

## 2013-04-18 DIAGNOSIS — D631 Anemia in chronic kidney disease: Secondary | ICD-10-CM | POA: Diagnosis not present

## 2013-04-18 DIAGNOSIS — N2581 Secondary hyperparathyroidism of renal origin: Secondary | ICD-10-CM | POA: Diagnosis not present

## 2013-05-15 DIAGNOSIS — N186 End stage renal disease: Secondary | ICD-10-CM | POA: Diagnosis not present

## 2013-05-16 DIAGNOSIS — N2581 Secondary hyperparathyroidism of renal origin: Secondary | ICD-10-CM | POA: Diagnosis not present

## 2013-05-16 DIAGNOSIS — D631 Anemia in chronic kidney disease: Secondary | ICD-10-CM | POA: Diagnosis not present

## 2013-05-16 DIAGNOSIS — N186 End stage renal disease: Secondary | ICD-10-CM | POA: Diagnosis not present

## 2013-06-15 DIAGNOSIS — N186 End stage renal disease: Secondary | ICD-10-CM | POA: Diagnosis not present

## 2013-06-17 DIAGNOSIS — D631 Anemia in chronic kidney disease: Secondary | ICD-10-CM | POA: Diagnosis not present

## 2013-06-17 DIAGNOSIS — N2581 Secondary hyperparathyroidism of renal origin: Secondary | ICD-10-CM | POA: Diagnosis not present

## 2013-06-17 DIAGNOSIS — N186 End stage renal disease: Secondary | ICD-10-CM | POA: Diagnosis not present

## 2013-07-16 DIAGNOSIS — N186 End stage renal disease: Secondary | ICD-10-CM | POA: Diagnosis not present

## 2013-07-18 DIAGNOSIS — N186 End stage renal disease: Secondary | ICD-10-CM | POA: Diagnosis not present

## 2013-07-18 DIAGNOSIS — D631 Anemia in chronic kidney disease: Secondary | ICD-10-CM | POA: Diagnosis not present

## 2013-07-18 DIAGNOSIS — N2581 Secondary hyperparathyroidism of renal origin: Secondary | ICD-10-CM | POA: Diagnosis not present

## 2013-07-20 DIAGNOSIS — N186 End stage renal disease: Secondary | ICD-10-CM | POA: Diagnosis not present

## 2013-07-20 DIAGNOSIS — N2581 Secondary hyperparathyroidism of renal origin: Secondary | ICD-10-CM | POA: Diagnosis not present

## 2013-07-20 DIAGNOSIS — D631 Anemia in chronic kidney disease: Secondary | ICD-10-CM | POA: Diagnosis not present

## 2013-07-21 DIAGNOSIS — E785 Hyperlipidemia, unspecified: Secondary | ICD-10-CM | POA: Diagnosis not present

## 2013-07-21 DIAGNOSIS — K219 Gastro-esophageal reflux disease without esophagitis: Secondary | ICD-10-CM | POA: Diagnosis not present

## 2013-07-21 DIAGNOSIS — I509 Heart failure, unspecified: Secondary | ICD-10-CM | POA: Diagnosis not present

## 2013-07-21 DIAGNOSIS — E1129 Type 2 diabetes mellitus with other diabetic kidney complication: Secondary | ICD-10-CM | POA: Diagnosis not present

## 2013-07-21 DIAGNOSIS — Z125 Encounter for screening for malignant neoplasm of prostate: Secondary | ICD-10-CM | POA: Diagnosis not present

## 2013-07-22 DIAGNOSIS — N186 End stage renal disease: Secondary | ICD-10-CM | POA: Diagnosis not present

## 2013-07-22 DIAGNOSIS — N2581 Secondary hyperparathyroidism of renal origin: Secondary | ICD-10-CM | POA: Diagnosis not present

## 2013-07-22 DIAGNOSIS — D631 Anemia in chronic kidney disease: Secondary | ICD-10-CM | POA: Diagnosis not present

## 2013-07-25 DIAGNOSIS — N2581 Secondary hyperparathyroidism of renal origin: Secondary | ICD-10-CM | POA: Diagnosis not present

## 2013-07-25 DIAGNOSIS — D631 Anemia in chronic kidney disease: Secondary | ICD-10-CM | POA: Diagnosis not present

## 2013-07-25 DIAGNOSIS — N186 End stage renal disease: Secondary | ICD-10-CM | POA: Diagnosis not present

## 2013-07-27 DIAGNOSIS — N186 End stage renal disease: Secondary | ICD-10-CM | POA: Diagnosis not present

## 2013-07-27 DIAGNOSIS — N2581 Secondary hyperparathyroidism of renal origin: Secondary | ICD-10-CM | POA: Diagnosis not present

## 2013-07-27 DIAGNOSIS — D631 Anemia in chronic kidney disease: Secondary | ICD-10-CM | POA: Diagnosis not present

## 2013-07-29 DIAGNOSIS — D631 Anemia in chronic kidney disease: Secondary | ICD-10-CM | POA: Diagnosis not present

## 2013-07-29 DIAGNOSIS — N2581 Secondary hyperparathyroidism of renal origin: Secondary | ICD-10-CM | POA: Diagnosis not present

## 2013-07-29 DIAGNOSIS — N186 End stage renal disease: Secondary | ICD-10-CM | POA: Diagnosis not present

## 2013-08-01 DIAGNOSIS — N186 End stage renal disease: Secondary | ICD-10-CM | POA: Diagnosis not present

## 2013-08-01 DIAGNOSIS — N2581 Secondary hyperparathyroidism of renal origin: Secondary | ICD-10-CM | POA: Diagnosis not present

## 2013-08-01 DIAGNOSIS — D631 Anemia in chronic kidney disease: Secondary | ICD-10-CM | POA: Diagnosis not present

## 2013-08-03 DIAGNOSIS — D631 Anemia in chronic kidney disease: Secondary | ICD-10-CM | POA: Diagnosis not present

## 2013-08-03 DIAGNOSIS — N2581 Secondary hyperparathyroidism of renal origin: Secondary | ICD-10-CM | POA: Diagnosis not present

## 2013-08-03 DIAGNOSIS — N186 End stage renal disease: Secondary | ICD-10-CM | POA: Diagnosis not present

## 2013-08-05 DIAGNOSIS — N186 End stage renal disease: Secondary | ICD-10-CM | POA: Diagnosis not present

## 2013-08-05 DIAGNOSIS — D631 Anemia in chronic kidney disease: Secondary | ICD-10-CM | POA: Diagnosis not present

## 2013-08-05 DIAGNOSIS — N2581 Secondary hyperparathyroidism of renal origin: Secondary | ICD-10-CM | POA: Diagnosis not present

## 2013-08-08 DIAGNOSIS — N2581 Secondary hyperparathyroidism of renal origin: Secondary | ICD-10-CM | POA: Diagnosis not present

## 2013-08-08 DIAGNOSIS — D631 Anemia in chronic kidney disease: Secondary | ICD-10-CM | POA: Diagnosis not present

## 2013-08-08 DIAGNOSIS — N186 End stage renal disease: Secondary | ICD-10-CM | POA: Diagnosis not present

## 2013-08-10 DIAGNOSIS — N186 End stage renal disease: Secondary | ICD-10-CM | POA: Diagnosis not present

## 2013-08-10 DIAGNOSIS — D631 Anemia in chronic kidney disease: Secondary | ICD-10-CM | POA: Diagnosis not present

## 2013-08-10 DIAGNOSIS — N2581 Secondary hyperparathyroidism of renal origin: Secondary | ICD-10-CM | POA: Diagnosis not present

## 2013-08-12 DIAGNOSIS — N186 End stage renal disease: Secondary | ICD-10-CM | POA: Diagnosis not present

## 2013-08-12 DIAGNOSIS — N2581 Secondary hyperparathyroidism of renal origin: Secondary | ICD-10-CM | POA: Diagnosis not present

## 2013-08-12 DIAGNOSIS — D631 Anemia in chronic kidney disease: Secondary | ICD-10-CM | POA: Diagnosis not present

## 2013-08-15 DIAGNOSIS — D631 Anemia in chronic kidney disease: Secondary | ICD-10-CM | POA: Diagnosis not present

## 2013-08-15 DIAGNOSIS — N2581 Secondary hyperparathyroidism of renal origin: Secondary | ICD-10-CM | POA: Diagnosis not present

## 2013-08-15 DIAGNOSIS — N186 End stage renal disease: Secondary | ICD-10-CM | POA: Diagnosis not present

## 2013-08-17 DIAGNOSIS — T82898A Other specified complication of vascular prosthetic devices, implants and grafts, initial encounter: Secondary | ICD-10-CM | POA: Diagnosis not present

## 2013-08-17 DIAGNOSIS — Z23 Encounter for immunization: Secondary | ICD-10-CM | POA: Diagnosis not present

## 2013-08-17 DIAGNOSIS — D631 Anemia in chronic kidney disease: Secondary | ICD-10-CM | POA: Diagnosis not present

## 2013-08-17 DIAGNOSIS — N2581 Secondary hyperparathyroidism of renal origin: Secondary | ICD-10-CM | POA: Diagnosis not present

## 2013-08-17 DIAGNOSIS — N186 End stage renal disease: Secondary | ICD-10-CM | POA: Diagnosis not present

## 2013-08-17 DIAGNOSIS — D509 Iron deficiency anemia, unspecified: Secondary | ICD-10-CM | POA: Diagnosis not present

## 2013-08-22 DIAGNOSIS — T82898A Other specified complication of vascular prosthetic devices, implants and grafts, initial encounter: Secondary | ICD-10-CM | POA: Diagnosis not present

## 2013-09-07 DIAGNOSIS — E1129 Type 2 diabetes mellitus with other diabetic kidney complication: Secondary | ICD-10-CM | POA: Diagnosis not present

## 2013-09-15 DIAGNOSIS — Z992 Dependence on renal dialysis: Secondary | ICD-10-CM | POA: Diagnosis not present

## 2013-09-15 DIAGNOSIS — T82898A Other specified complication of vascular prosthetic devices, implants and grafts, initial encounter: Secondary | ICD-10-CM | POA: Diagnosis not present

## 2013-09-15 DIAGNOSIS — N186 End stage renal disease: Secondary | ICD-10-CM | POA: Diagnosis not present

## 2013-09-16 DIAGNOSIS — N2581 Secondary hyperparathyroidism of renal origin: Secondary | ICD-10-CM | POA: Diagnosis not present

## 2013-09-16 DIAGNOSIS — N186 End stage renal disease: Secondary | ICD-10-CM | POA: Diagnosis not present

## 2013-09-16 DIAGNOSIS — D631 Anemia in chronic kidney disease: Secondary | ICD-10-CM | POA: Diagnosis not present

## 2013-10-15 DIAGNOSIS — N186 End stage renal disease: Secondary | ICD-10-CM | POA: Diagnosis not present

## 2013-10-17 DIAGNOSIS — N2581 Secondary hyperparathyroidism of renal origin: Secondary | ICD-10-CM | POA: Diagnosis not present

## 2013-10-17 DIAGNOSIS — Z23 Encounter for immunization: Secondary | ICD-10-CM | POA: Diagnosis not present

## 2013-10-17 DIAGNOSIS — D631 Anemia in chronic kidney disease: Secondary | ICD-10-CM | POA: Diagnosis not present

## 2013-10-17 DIAGNOSIS — D509 Iron deficiency anemia, unspecified: Secondary | ICD-10-CM | POA: Diagnosis not present

## 2013-10-17 DIAGNOSIS — N186 End stage renal disease: Secondary | ICD-10-CM | POA: Diagnosis not present

## 2013-10-20 DIAGNOSIS — E1129 Type 2 diabetes mellitus with other diabetic kidney complication: Secondary | ICD-10-CM | POA: Diagnosis not present

## 2013-10-20 DIAGNOSIS — E785 Hyperlipidemia, unspecified: Secondary | ICD-10-CM | POA: Diagnosis not present

## 2013-10-20 DIAGNOSIS — I509 Heart failure, unspecified: Secondary | ICD-10-CM | POA: Diagnosis not present

## 2013-10-20 DIAGNOSIS — K219 Gastro-esophageal reflux disease without esophagitis: Secondary | ICD-10-CM | POA: Diagnosis not present

## 2013-10-23 DIAGNOSIS — N186 End stage renal disease: Secondary | ICD-10-CM | POA: Diagnosis not present

## 2013-10-23 DIAGNOSIS — T82898A Other specified complication of vascular prosthetic devices, implants and grafts, initial encounter: Secondary | ICD-10-CM | POA: Diagnosis not present

## 2013-11-15 DIAGNOSIS — N186 End stage renal disease: Secondary | ICD-10-CM | POA: Diagnosis not present

## 2013-11-18 DIAGNOSIS — N2581 Secondary hyperparathyroidism of renal origin: Secondary | ICD-10-CM | POA: Diagnosis not present

## 2013-11-18 DIAGNOSIS — N186 End stage renal disease: Secondary | ICD-10-CM | POA: Diagnosis not present

## 2013-11-18 DIAGNOSIS — D509 Iron deficiency anemia, unspecified: Secondary | ICD-10-CM | POA: Diagnosis not present

## 2013-11-18 DIAGNOSIS — D631 Anemia in chronic kidney disease: Secondary | ICD-10-CM | POA: Diagnosis not present

## 2013-12-08 DIAGNOSIS — E119 Type 2 diabetes mellitus without complications: Secondary | ICD-10-CM | POA: Diagnosis not present

## 2013-12-14 DIAGNOSIS — E1129 Type 2 diabetes mellitus with other diabetic kidney complication: Secondary | ICD-10-CM | POA: Diagnosis not present

## 2013-12-16 DIAGNOSIS — N186 End stage renal disease: Secondary | ICD-10-CM | POA: Diagnosis not present

## 2013-12-19 DIAGNOSIS — D631 Anemia in chronic kidney disease: Secondary | ICD-10-CM | POA: Diagnosis not present

## 2013-12-19 DIAGNOSIS — N2581 Secondary hyperparathyroidism of renal origin: Secondary | ICD-10-CM | POA: Diagnosis not present

## 2013-12-19 DIAGNOSIS — D509 Iron deficiency anemia, unspecified: Secondary | ICD-10-CM | POA: Diagnosis not present

## 2013-12-19 DIAGNOSIS — N186 End stage renal disease: Secondary | ICD-10-CM | POA: Diagnosis not present

## 2014-01-13 DIAGNOSIS — N186 End stage renal disease: Secondary | ICD-10-CM | POA: Diagnosis not present

## 2014-01-16 DIAGNOSIS — N186 End stage renal disease: Secondary | ICD-10-CM | POA: Diagnosis not present

## 2014-01-16 DIAGNOSIS — N039 Chronic nephritic syndrome with unspecified morphologic changes: Secondary | ICD-10-CM | POA: Diagnosis not present

## 2014-01-16 DIAGNOSIS — N2581 Secondary hyperparathyroidism of renal origin: Secondary | ICD-10-CM | POA: Diagnosis not present

## 2014-01-16 DIAGNOSIS — D631 Anemia in chronic kidney disease: Secondary | ICD-10-CM | POA: Diagnosis not present

## 2014-01-24 DIAGNOSIS — E1129 Type 2 diabetes mellitus with other diabetic kidney complication: Secondary | ICD-10-CM | POA: Diagnosis not present

## 2014-01-24 DIAGNOSIS — J449 Chronic obstructive pulmonary disease, unspecified: Secondary | ICD-10-CM | POA: Diagnosis not present

## 2014-01-24 DIAGNOSIS — E1165 Type 2 diabetes mellitus with hyperglycemia: Secondary | ICD-10-CM | POA: Diagnosis not present

## 2014-01-24 DIAGNOSIS — E785 Hyperlipidemia, unspecified: Secondary | ICD-10-CM | POA: Diagnosis not present

## 2014-01-24 DIAGNOSIS — N186 End stage renal disease: Secondary | ICD-10-CM | POA: Diagnosis not present

## 2014-01-24 DIAGNOSIS — I509 Heart failure, unspecified: Secondary | ICD-10-CM | POA: Diagnosis not present

## 2014-02-13 DIAGNOSIS — N186 End stage renal disease: Secondary | ICD-10-CM | POA: Diagnosis not present

## 2014-02-15 DIAGNOSIS — N2581 Secondary hyperparathyroidism of renal origin: Secondary | ICD-10-CM | POA: Diagnosis not present

## 2014-02-15 DIAGNOSIS — D509 Iron deficiency anemia, unspecified: Secondary | ICD-10-CM | POA: Diagnosis not present

## 2014-02-15 DIAGNOSIS — E1129 Type 2 diabetes mellitus with other diabetic kidney complication: Secondary | ICD-10-CM | POA: Diagnosis not present

## 2014-02-15 DIAGNOSIS — D631 Anemia in chronic kidney disease: Secondary | ICD-10-CM | POA: Diagnosis not present

## 2014-02-15 DIAGNOSIS — N186 End stage renal disease: Secondary | ICD-10-CM | POA: Diagnosis not present

## 2014-03-08 DIAGNOSIS — E1129 Type 2 diabetes mellitus with other diabetic kidney complication: Secondary | ICD-10-CM | POA: Diagnosis not present

## 2014-03-15 DIAGNOSIS — N186 End stage renal disease: Secondary | ICD-10-CM | POA: Diagnosis not present

## 2014-03-17 DIAGNOSIS — N186 End stage renal disease: Secondary | ICD-10-CM | POA: Diagnosis not present

## 2014-03-17 DIAGNOSIS — E1129 Type 2 diabetes mellitus with other diabetic kidney complication: Secondary | ICD-10-CM | POA: Diagnosis not present

## 2014-03-17 DIAGNOSIS — D631 Anemia in chronic kidney disease: Secondary | ICD-10-CM | POA: Diagnosis not present

## 2014-03-17 DIAGNOSIS — N039 Chronic nephritic syndrome with unspecified morphologic changes: Secondary | ICD-10-CM | POA: Diagnosis not present

## 2014-03-17 DIAGNOSIS — N2581 Secondary hyperparathyroidism of renal origin: Secondary | ICD-10-CM | POA: Diagnosis not present

## 2014-04-12 ENCOUNTER — Ambulatory Visit: Payer: Self-pay

## 2014-04-15 DIAGNOSIS — N186 End stage renal disease: Secondary | ICD-10-CM | POA: Diagnosis not present

## 2014-04-17 DIAGNOSIS — N186 End stage renal disease: Secondary | ICD-10-CM | POA: Diagnosis not present

## 2014-04-17 DIAGNOSIS — E1129 Type 2 diabetes mellitus with other diabetic kidney complication: Secondary | ICD-10-CM | POA: Diagnosis not present

## 2014-04-17 DIAGNOSIS — N2581 Secondary hyperparathyroidism of renal origin: Secondary | ICD-10-CM | POA: Diagnosis not present

## 2014-04-17 DIAGNOSIS — D631 Anemia in chronic kidney disease: Secondary | ICD-10-CM | POA: Diagnosis not present

## 2014-04-17 DIAGNOSIS — D509 Iron deficiency anemia, unspecified: Secondary | ICD-10-CM | POA: Diagnosis not present

## 2014-04-18 ENCOUNTER — Ambulatory Visit (INDEPENDENT_AMBULATORY_CARE_PROVIDER_SITE_OTHER): Payer: Medicare Other

## 2014-04-18 VITALS — BP 193/84 | HR 62 | Temp 97.3°F | Resp 18

## 2014-04-18 DIAGNOSIS — M79609 Pain in unspecified limb: Secondary | ICD-10-CM

## 2014-04-18 DIAGNOSIS — I739 Peripheral vascular disease, unspecified: Secondary | ICD-10-CM

## 2014-04-18 DIAGNOSIS — L97509 Non-pressure chronic ulcer of other part of unspecified foot with unspecified severity: Secondary | ICD-10-CM | POA: Diagnosis not present

## 2014-04-18 DIAGNOSIS — E1149 Type 2 diabetes mellitus with other diabetic neurological complication: Secondary | ICD-10-CM | POA: Diagnosis not present

## 2014-04-18 DIAGNOSIS — E1140 Type 2 diabetes mellitus with diabetic neuropathy, unspecified: Secondary | ICD-10-CM

## 2014-04-18 MED ORDER — SILVER SULFADIAZINE 1 % EX CREA: 1.0000 "application " | TOPICAL_CREAM | Freq: Every day | CUTANEOUS | Status: DC

## 2014-04-18 NOTE — Progress Notes (Signed)
   Subjective:    Patient ID: Garrett Salas, male    DOB: 04/12/1951, 63 y.o.   MRN: [id number]  HPI I AM A DIABETIC AND HAVE BEEN FOR ABOUT 20 YEARS AND GET SORE AND TENDER AND NEEDLES COMING OUT AND BURNS AND THROBS AND NUMBNESS AND TINGLING AND A SPOT ON MY RIGHT BIG TOE HAS BEEN THERE FOR ABOUT 3 WEEKS AND NO DRAINING AND USED PEROXIDE AND PLACES ON BALLS OF BOTH FEET AND I GO TO DIALYSIS THREE TIMES A WEEK    Review of Systems  Eyes:       Flow tears all the time  Endocrine: Positive for cold intolerance.  Musculoskeletal: Positive for back pain and neck pain.  Neurological: Positive for weakness, light-headedness and numbness.  Hematological: Bruises/bleeds easily.  All other systems reviewed and are negative.      Objective:   Physical Exam Lower extremity vascular status is greatly diminished thready dorsalis pedis at best possibly one over 4 PT nonpalpable bilateral temperature warm to cool turgor grossly diminished there is some mild edema plus one over pallor noted slight hyperpigmentation of skin noted and some induration skin and reverse changes noted of the forefoot and digits. Capillary refill time 4-5 seconds all digits no open wounds or ulcerations noted anywhere however there is hemorrhage a keratoses and pre-ulceration distal tuft of the right hallux noted on debridement there is about a 2-3 mm ulcer distal tuft of the right hallux. Neurologically epicritic and proprioceptive sensations are diminished on Semmes Weinstein testing to forefoot digits her patient severe hypersensitivity on palpation of the foot and range of motion or compression of the foot secondary to diabetes and neuropathy. Orthopedic biomechanical exam rectus foot type hallux rectus mild flexible digital contractures are noted no other significant abnormalities are identified. No secondary infections no open wounds or ulcerations otherwise identified and distal tuft of the right hallux shoes are noted to be  adequate size however may not be placing him slight in his foot is sliding forward and hitting the end of the shoes stressed the importance of replacing the shoes daily for walking as he is dragging his feet on the carpet and is likely jamming his toes the end of the shoe producing a contusion and subsequent ulceration of the right hallux.       Assessment & Plan:  Assessment this time his diabetes with peripheral neuropathy and angiopathy early ulceration 2 mm ulcer distal tuft right hallux with hemorrhage a keratoses which is debrided at this time Silvadene and gauze dressing applied prescription for Silvadene for in to pharmacy patient will do dressing daily cleansing and dressing change with Silvadene gauze dressing are Band-Aid dressing to the hallux and also is much is possible recheck in one month for followup contact us is any changes or exacerbations any fever chills or local signs of infection were to occur  Harriet Masson DPM

## 2014-04-18 NOTE — Patient Instructions (Signed)
ANTIBACTERIAL SOAP INSTRUCTIONS  THE DAY AFTER PROCEDURE  Please follow the instructions your doctor has marked.   Shower as usual. Before getting out, place a drop of antibacterial liquid soap (Dial) on a wet, clean washcloth.  Gently wipe washcloth over affected area.  Afterward, rinse the area with warm water.  Blot the area dry with a soft cloth and cover with antibiotic ointment (neosporin, polysporin, bacitracin) and band aid or gauze and tape  Place 3-4 drops of antibacterial liquid soap in a quart of warm tap water.  Submerge foot into water for 20 minutes.  If bandage was applied after your procedure, leave on to allow for easy lift off, then remove and continue with soak for the remaining time.  Next, blot area dry with a soft cloth and cover with a bandage.  Apply other medications as directed by your doctor, such as cortisporin otic solution (eardrops) or neosporin antibiotic ointment  After washing feet and trying thoroughly and drying the wound site thoroughly apply Silvadene and gauze or Band-Aid dressing to the right great toe as instructed

## 2014-05-15 DIAGNOSIS — N186 End stage renal disease: Secondary | ICD-10-CM | POA: Diagnosis not present

## 2014-05-16 ENCOUNTER — Ambulatory Visit: Payer: Medicare Other

## 2014-05-17 DIAGNOSIS — N186 End stage renal disease: Secondary | ICD-10-CM | POA: Diagnosis not present

## 2014-05-17 DIAGNOSIS — D631 Anemia in chronic kidney disease: Secondary | ICD-10-CM | POA: Diagnosis not present

## 2014-05-17 DIAGNOSIS — N2581 Secondary hyperparathyroidism of renal origin: Secondary | ICD-10-CM | POA: Diagnosis not present

## 2014-05-17 DIAGNOSIS — D509 Iron deficiency anemia, unspecified: Secondary | ICD-10-CM | POA: Diagnosis not present

## 2014-05-17 DIAGNOSIS — E1129 Type 2 diabetes mellitus with other diabetic kidney complication: Secondary | ICD-10-CM | POA: Diagnosis not present

## 2014-06-07 DIAGNOSIS — E1129 Type 2 diabetes mellitus with other diabetic kidney complication: Secondary | ICD-10-CM | POA: Diagnosis not present

## 2014-06-15 DIAGNOSIS — N186 End stage renal disease: Secondary | ICD-10-CM | POA: Diagnosis not present

## 2014-06-16 DIAGNOSIS — N186 End stage renal disease: Secondary | ICD-10-CM | POA: Diagnosis not present

## 2014-06-16 DIAGNOSIS — E1129 Type 2 diabetes mellitus with other diabetic kidney complication: Secondary | ICD-10-CM | POA: Diagnosis not present

## 2014-06-16 DIAGNOSIS — N2581 Secondary hyperparathyroidism of renal origin: Secondary | ICD-10-CM | POA: Diagnosis not present

## 2014-06-16 DIAGNOSIS — D509 Iron deficiency anemia, unspecified: Secondary | ICD-10-CM | POA: Diagnosis not present

## 2014-06-20 ENCOUNTER — Ambulatory Visit (INDEPENDENT_AMBULATORY_CARE_PROVIDER_SITE_OTHER): Payer: Medicare Other

## 2014-06-20 VITALS — BP 187/84 | HR 60 | Resp 18

## 2014-06-20 DIAGNOSIS — L97509 Non-pressure chronic ulcer of other part of unspecified foot with unspecified severity: Secondary | ICD-10-CM

## 2014-06-20 DIAGNOSIS — E1140 Type 2 diabetes mellitus with diabetic neuropathy, unspecified: Secondary | ICD-10-CM

## 2014-06-20 DIAGNOSIS — I739 Peripheral vascular disease, unspecified: Secondary | ICD-10-CM

## 2014-06-20 DIAGNOSIS — M79606 Pain in leg, unspecified: Secondary | ICD-10-CM

## 2014-06-20 NOTE — Patient Instructions (Signed)
   ANTIBACTERIAL SOAP INSTRUCTIONS  THE DAY AFTER PROCEDURE  Please follow the instructions your doctor has marked.   Shower as usual. Before getting out, place a drop of antibacterial liquid soap (Dial) on a wet, clean washcloth.  Gently wipe washcloth over affected area.  Afterward, rinse the area with warm water.  Blot the area dry with a soft cloth and cover with antibiotic ointment (neosporin, polysporin, bacitracin) and band aid or gauze and tape  Place 3-4 drops of antibacterial liquid soap in a quart of warm tap water.  Submerge foot into water for 20 minutes.  If bandage was applied after your procedure, leave on to allow for easy lift off, then remove and continue with soak for the remaining time.  Next, blot area dry with a soft cloth and cover with a bandage.  Apply other medications as directed by your doctor, such as cortisporin otic solution (eardrops) or neosporin antibiotic ointment   Wash the foot daily with soap and water as instructed apply Silvadene cream and a Band-Aid dressing to the end of the right great toe hours the wound is stays clean and dry for more than a weeks may discontinue the dressings and soaks. However maintain appropriate loosefitting shoes and socks at all times. Contact us immediately if there is any worsening of pain drainage redness or swelling.

## 2014-06-20 NOTE — Progress Notes (Signed)
   Subjective:    Patient ID: Garrett Salas, male    DOB: 02-23-1951, 63 y.o.   MRN: [id number]  HPI MY BIG TOE ON MY RIGHT FOOT IS DOING BETTER AND WE HAVE USED NEOSPORIN AND THERE HAS BEEN NO DRAINING AND THE 2ND TOE ON THE RIGHT WAS SORE     Review of Systems no new findings or systemic changes noted     Objective:   Physical Exam It's been almost a month since patient was last seen 63 year old African American male presents with continued pain and discomfort distal tuft of the right hallux and second digit this is mostly diabetic neuropathic or ischemic pain the ulcer is still about 2 mm diameter ulceration down to dermal level only no purulence no discharge or drainage noted they continue to apply Silvadene and Band-Aid dressing to that right great toe. Objective findings as follows vascular status reveals PT pulse nonpalpable bilateral dorsalis pedis ready at one over 4 bilateral Refill timed Rita 4 seconds all digits there is some hyperpigmentation of distal foot and ankle areas atrophy the skin no edema temperature is cool turgor greatly diminished. Epicritic sensations intact although diminished on Semmes Weinstein testing to the plantar arch and forefoot has hyperesthesia of the distal tuft of the digits hallux and second both feet right more so than left. No no additional open wounds or ulcers noted other than the small 2 mm ulcer distal tuft of the right hallux nails thick brittle crumbly friable with dystrophy and mycosis.         Assessment & Plan:  Assessment diabetic neuropathic ischemic ulcer debridement distal tuft of the right hallux 2 mm ulcer down to dermal level only Silvadene and Band-Aid applied maintain Silvadene and Band-Aid for next couple weeks as long as there is evidence of ulcer however has not draining from or 2 weeks may discontinue his treatments maintain appropriate socks and accommodative shoes at all times. Also reappointed 2 months for long-term followup and  possible diabetic foot and nail care nail debridement in the future as needed for mycotic dystrophic nails. Contact us immediately dizzy changes or exacerbations of symptoms pain discomfort or any signs of infection.  Harriet Masson DPM

## 2014-07-16 DIAGNOSIS — N186 End stage renal disease: Secondary | ICD-10-CM | POA: Diagnosis not present

## 2014-07-17 DIAGNOSIS — D509 Iron deficiency anemia, unspecified: Secondary | ICD-10-CM | POA: Diagnosis not present

## 2014-07-17 DIAGNOSIS — D631 Anemia in chronic kidney disease: Secondary | ICD-10-CM | POA: Diagnosis not present

## 2014-07-17 DIAGNOSIS — N2581 Secondary hyperparathyroidism of renal origin: Secondary | ICD-10-CM | POA: Diagnosis not present

## 2014-07-17 DIAGNOSIS — N186 End stage renal disease: Secondary | ICD-10-CM | POA: Diagnosis not present

## 2014-07-17 DIAGNOSIS — N039 Chronic nephritic syndrome with unspecified morphologic changes: Secondary | ICD-10-CM | POA: Diagnosis not present

## 2014-07-17 DIAGNOSIS — E1129 Type 2 diabetes mellitus with other diabetic kidney complication: Secondary | ICD-10-CM | POA: Diagnosis not present

## 2014-08-15 DIAGNOSIS — N186 End stage renal disease: Secondary | ICD-10-CM | POA: Diagnosis not present

## 2014-08-16 DIAGNOSIS — N2581 Secondary hyperparathyroidism of renal origin: Secondary | ICD-10-CM | POA: Diagnosis not present

## 2014-08-16 DIAGNOSIS — D509 Iron deficiency anemia, unspecified: Secondary | ICD-10-CM | POA: Diagnosis not present

## 2014-08-16 DIAGNOSIS — Z23 Encounter for immunization: Secondary | ICD-10-CM | POA: Diagnosis not present

## 2014-08-16 DIAGNOSIS — N186 End stage renal disease: Secondary | ICD-10-CM | POA: Diagnosis not present

## 2014-08-16 DIAGNOSIS — E1129 Type 2 diabetes mellitus with other diabetic kidney complication: Secondary | ICD-10-CM | POA: Diagnosis not present

## 2014-08-22 ENCOUNTER — Ambulatory Visit: Payer: Medicare Other

## 2014-09-13 DIAGNOSIS — E1129 Type 2 diabetes mellitus with other diabetic kidney complication: Secondary | ICD-10-CM | POA: Diagnosis not present

## 2014-09-15 DIAGNOSIS — Z992 Dependence on renal dialysis: Secondary | ICD-10-CM | POA: Diagnosis not present

## 2014-09-15 DIAGNOSIS — N186 End stage renal disease: Secondary | ICD-10-CM | POA: Diagnosis not present

## 2014-09-18 DIAGNOSIS — N186 End stage renal disease: Secondary | ICD-10-CM | POA: Diagnosis not present

## 2014-09-18 DIAGNOSIS — N2581 Secondary hyperparathyroidism of renal origin: Secondary | ICD-10-CM | POA: Diagnosis not present

## 2014-09-18 DIAGNOSIS — E1129 Type 2 diabetes mellitus with other diabetic kidney complication: Secondary | ICD-10-CM | POA: Diagnosis not present

## 2014-09-18 DIAGNOSIS — D631 Anemia in chronic kidney disease: Secondary | ICD-10-CM | POA: Diagnosis not present

## 2014-10-15 DIAGNOSIS — Z992 Dependence on renal dialysis: Secondary | ICD-10-CM | POA: Diagnosis not present

## 2014-10-15 DIAGNOSIS — N186 End stage renal disease: Secondary | ICD-10-CM | POA: Diagnosis not present

## 2014-10-16 DIAGNOSIS — E1129 Type 2 diabetes mellitus with other diabetic kidney complication: Secondary | ICD-10-CM | POA: Diagnosis not present

## 2014-10-16 DIAGNOSIS — N186 End stage renal disease: Secondary | ICD-10-CM | POA: Diagnosis not present

## 2014-10-16 DIAGNOSIS — D631 Anemia in chronic kidney disease: Secondary | ICD-10-CM | POA: Diagnosis not present

## 2014-10-16 DIAGNOSIS — N2581 Secondary hyperparathyroidism of renal origin: Secondary | ICD-10-CM | POA: Diagnosis not present

## 2014-11-15 DIAGNOSIS — Z992 Dependence on renal dialysis: Secondary | ICD-10-CM | POA: Diagnosis not present

## 2014-11-15 DIAGNOSIS — N186 End stage renal disease: Secondary | ICD-10-CM | POA: Diagnosis not present

## 2014-11-16 DIAGNOSIS — T8859XA Other complications of anesthesia, initial encounter: Secondary | ICD-10-CM

## 2014-11-16 HISTORY — DX: Other complications of anesthesia, initial encounter: T88.59XA

## 2014-11-17 DIAGNOSIS — N2581 Secondary hyperparathyroidism of renal origin: Secondary | ICD-10-CM | POA: Diagnosis not present

## 2014-11-17 DIAGNOSIS — E1129 Type 2 diabetes mellitus with other diabetic kidney complication: Secondary | ICD-10-CM | POA: Diagnosis not present

## 2014-11-17 DIAGNOSIS — N186 End stage renal disease: Secondary | ICD-10-CM | POA: Diagnosis not present

## 2014-11-20 DIAGNOSIS — N2581 Secondary hyperparathyroidism of renal origin: Secondary | ICD-10-CM | POA: Diagnosis not present

## 2014-11-20 DIAGNOSIS — N186 End stage renal disease: Secondary | ICD-10-CM | POA: Diagnosis not present

## 2014-11-20 DIAGNOSIS — E1129 Type 2 diabetes mellitus with other diabetic kidney complication: Secondary | ICD-10-CM | POA: Diagnosis not present

## 2014-11-21 DIAGNOSIS — N189 Chronic kidney disease, unspecified: Secondary | ICD-10-CM | POA: Diagnosis not present

## 2014-11-21 DIAGNOSIS — H3530 Unspecified macular degeneration: Secondary | ICD-10-CM | POA: Diagnosis not present

## 2014-11-21 DIAGNOSIS — I509 Heart failure, unspecified: Secondary | ICD-10-CM | POA: Diagnosis not present

## 2014-11-21 DIAGNOSIS — E1129 Type 2 diabetes mellitus with other diabetic kidney complication: Secondary | ICD-10-CM | POA: Diagnosis not present

## 2014-11-21 DIAGNOSIS — E785 Hyperlipidemia, unspecified: Secondary | ICD-10-CM | POA: Diagnosis not present

## 2014-11-21 DIAGNOSIS — Z6821 Body mass index (BMI) 21.0-21.9, adult: Secondary | ICD-10-CM | POA: Diagnosis not present

## 2014-11-21 DIAGNOSIS — R5382 Chronic fatigue, unspecified: Secondary | ICD-10-CM | POA: Diagnosis not present

## 2014-11-21 DIAGNOSIS — K219 Gastro-esophageal reflux disease without esophagitis: Secondary | ICD-10-CM | POA: Diagnosis not present

## 2014-11-21 DIAGNOSIS — Z125 Encounter for screening for malignant neoplasm of prostate: Secondary | ICD-10-CM | POA: Diagnosis not present

## 2014-11-22 DIAGNOSIS — N186 End stage renal disease: Secondary | ICD-10-CM | POA: Diagnosis not present

## 2014-11-22 DIAGNOSIS — E1129 Type 2 diabetes mellitus with other diabetic kidney complication: Secondary | ICD-10-CM | POA: Diagnosis not present

## 2014-11-22 DIAGNOSIS — N2581 Secondary hyperparathyroidism of renal origin: Secondary | ICD-10-CM | POA: Diagnosis not present

## 2014-11-24 DIAGNOSIS — N186 End stage renal disease: Secondary | ICD-10-CM | POA: Diagnosis not present

## 2014-11-24 DIAGNOSIS — E1129 Type 2 diabetes mellitus with other diabetic kidney complication: Secondary | ICD-10-CM | POA: Diagnosis not present

## 2014-11-24 DIAGNOSIS — N2581 Secondary hyperparathyroidism of renal origin: Secondary | ICD-10-CM | POA: Diagnosis not present

## 2014-11-26 ENCOUNTER — Ambulatory Visit (INDEPENDENT_AMBULATORY_CARE_PROVIDER_SITE_OTHER): Payer: Medicare Other

## 2014-11-26 VITALS — BP 143/86 | HR 86 | Resp 12

## 2014-11-26 DIAGNOSIS — M79606 Pain in leg, unspecified: Secondary | ICD-10-CM

## 2014-11-26 DIAGNOSIS — L97509 Non-pressure chronic ulcer of other part of unspecified foot with unspecified severity: Secondary | ICD-10-CM

## 2014-11-26 DIAGNOSIS — L97501 Non-pressure chronic ulcer of other part of unspecified foot limited to breakdown of skin: Secondary | ICD-10-CM

## 2014-11-26 DIAGNOSIS — E1140 Type 2 diabetes mellitus with diabetic neuropathy, unspecified: Secondary | ICD-10-CM | POA: Diagnosis not present

## 2014-11-26 DIAGNOSIS — I739 Peripheral vascular disease, unspecified: Secondary | ICD-10-CM

## 2014-11-26 MED ORDER — CEPHALEXIN 500 MG PO CAPS: 500.0000 mg | ORAL_CAPSULE | Freq: Two times a day (BID) | ORAL | Status: DC

## 2014-11-26 NOTE — Progress Notes (Signed)
   Subjective:    Patient ID: Garrett Salas, male    DOB: [birth year], 64 y.o.   MRN: [id number]  HPI  ''RT FOOT IS HURTING AND DRAINING.''   64 year old after marking male presents this time with caregiver. Patient has a proxy 3 month history of ulceration medial nail border of the right great toe. Last seen more than 5 or 6 months ago. Patient does have history of diabetes also on renal dialysis. Have multiple reasons why he failed to follow-up as he was recommended to in the past     Review of Systems no new findings or systemic changes noted patient is on dialysis has significant history of vascular compromise can do with walking claudication-type pain due to poor circulation cold feet and ischemic changes of both feet being noted. .    Objective:   Physical Exam lower extremity objective findings as follows patient's pulses DP and PT nonpalpable bilateral temperature warm to cool turgor diminished there is no edema no varicosities noted there is some hyperpigmentation of the toes distal tuft second third fourth and fifth toes. Right hallux medial border shows area proxy 17 L by half centimeter of pink granular tissue with some ulceration the medial nail fold. No history of injury trauma however does not appear to be an ingrown nail and nail appears to be criptotic however uninvolved with the nail folds of ulceration. Possibly an irritation from shoe or sock. Orthopedic biomechanical exam rectus foot type mild semirigid digital contractures noted rectus foot type otherwise identified no x-rays taken at this time neurologically skin color pigment normal hair growth absent there is pigmentation the toes nail somewhat criptotic incurvated and friable. There is ulceration medial nail fold of right great toe mild serous bloody drainage is noted. No ascending cellulitis or lymphangitis noted      Assessment & Plan:  Assessment this time patient does have diabetes history of PVD and history of  peripheral neuropathy on renal dialysis does have claudication-like symptomology and ischemic ulceration of the hallux. Is not operable due to his vascular status age and contribute in factors of renal failure etc. Patient this time is candidate for supportive therapy the ulcer medial hallux show some slight erythema mild serous drainage patient is placed on cephalexin 500 mg twice a day 10 days to take after his dialysis treatments. Patient will be recheck in 2-3 weeks for follow-up will continue with Silvadene gauze and gauze dressing changes daily after cleansing with soap and water as instructed will maintain a regular basis for palliative care diabetic foot care in the future as needed recheck in 2-3 weeks for follow-up  Harriet Masson DPM

## 2014-11-26 NOTE — Patient Instructions (Signed)
ANTIBACTERIAL SOAP INSTRUCTIONS  THE DAY AFTER PROCEDURE  Please follow the instructions your doctor has marked.   Shower as usual. Before getting out, place a drop of antibacterial liquid soap (Dial) on a wet, clean washcloth.  Gently wipe washcloth over affected area.  Afterward, rinse the area with warm water.  Blot the area dry with a soft cloth and cover with antibiotic ointment (neosporin, polysporin, bacitracin) and band aid or gauze and tape  Place 3-4 drops of antibacterial liquid soap in a quart of warm tap water.  Submerge foot into water for 20 minutes.  If bandage was applied after your procedure, leave on to allow for easy lift off, then remove and continue with soak for the remaining time.  Next, blot area dry with a soft cloth and cover with a bandage.  Apply other medications as directed by your doctor, such as cortisporin otic solution (eardrops) or neosporin antibiotic ointment  After washer soaking right great toe apply Silvadene and Band-Aid or gauze dressing daily as instructed

## 2014-11-27 DIAGNOSIS — N186 End stage renal disease: Secondary | ICD-10-CM | POA: Diagnosis not present

## 2014-11-27 DIAGNOSIS — E1129 Type 2 diabetes mellitus with other diabetic kidney complication: Secondary | ICD-10-CM | POA: Diagnosis not present

## 2014-11-27 DIAGNOSIS — N2581 Secondary hyperparathyroidism of renal origin: Secondary | ICD-10-CM | POA: Diagnosis not present

## 2014-11-29 DIAGNOSIS — N2581 Secondary hyperparathyroidism of renal origin: Secondary | ICD-10-CM | POA: Diagnosis not present

## 2014-11-29 DIAGNOSIS — N186 End stage renal disease: Secondary | ICD-10-CM | POA: Diagnosis not present

## 2014-11-29 DIAGNOSIS — E1129 Type 2 diabetes mellitus with other diabetic kidney complication: Secondary | ICD-10-CM | POA: Diagnosis not present

## 2014-12-01 DIAGNOSIS — N2581 Secondary hyperparathyroidism of renal origin: Secondary | ICD-10-CM | POA: Diagnosis not present

## 2014-12-01 DIAGNOSIS — E1129 Type 2 diabetes mellitus with other diabetic kidney complication: Secondary | ICD-10-CM | POA: Diagnosis not present

## 2014-12-01 DIAGNOSIS — N186 End stage renal disease: Secondary | ICD-10-CM | POA: Diagnosis not present

## 2014-12-03 ENCOUNTER — Telehealth: Payer: Self-pay

## 2014-12-03 NOTE — Telephone Encounter (Signed)
I HAVE BEEN TRYING TO CALL THIS NUMBER SINCE JESSICA TALKED TO ME AND THE NUMBER I HAVE BEEN TRYING TO CALL HAS A BUSY SIGNAL. Garrett Salas

## 2014-12-03 NOTE — Telephone Encounter (Signed)
Patient's caregiver called saying the patient was seen on 01.11.2016 and is scheduled to come back in for a check up on 02.01.2016. She stated when the patient took his sock off his toenail fell off and she does not know what to do. I called Lattie Haw and Lattie Haw said she was going to talk to Dr. Blenda Mounts and then call the caregiver back.

## 2014-12-04 ENCOUNTER — Other Ambulatory Visit: Payer: Self-pay | Admitting: *Deleted

## 2014-12-04 DIAGNOSIS — N2581 Secondary hyperparathyroidism of renal origin: Secondary | ICD-10-CM | POA: Diagnosis not present

## 2014-12-04 DIAGNOSIS — N186 End stage renal disease: Secondary | ICD-10-CM | POA: Diagnosis not present

## 2014-12-04 DIAGNOSIS — I70245 Atherosclerosis of native arteries of left leg with ulceration of other part of foot: Secondary | ICD-10-CM

## 2014-12-04 DIAGNOSIS — E1129 Type 2 diabetes mellitus with other diabetic kidney complication: Secondary | ICD-10-CM | POA: Diagnosis not present

## 2014-12-06 DIAGNOSIS — N2581 Secondary hyperparathyroidism of renal origin: Secondary | ICD-10-CM | POA: Diagnosis not present

## 2014-12-06 DIAGNOSIS — E1129 Type 2 diabetes mellitus with other diabetic kidney complication: Secondary | ICD-10-CM | POA: Diagnosis not present

## 2014-12-06 DIAGNOSIS — N186 End stage renal disease: Secondary | ICD-10-CM | POA: Diagnosis not present

## 2014-12-08 DIAGNOSIS — N2581 Secondary hyperparathyroidism of renal origin: Secondary | ICD-10-CM | POA: Diagnosis not present

## 2014-12-08 DIAGNOSIS — E1129 Type 2 diabetes mellitus with other diabetic kidney complication: Secondary | ICD-10-CM | POA: Diagnosis not present

## 2014-12-08 DIAGNOSIS — N186 End stage renal disease: Secondary | ICD-10-CM | POA: Diagnosis not present

## 2014-12-10 ENCOUNTER — Encounter: Payer: Self-pay | Admitting: Vascular Surgery

## 2014-12-11 DIAGNOSIS — N2581 Secondary hyperparathyroidism of renal origin: Secondary | ICD-10-CM | POA: Diagnosis not present

## 2014-12-11 DIAGNOSIS — N186 End stage renal disease: Secondary | ICD-10-CM | POA: Diagnosis not present

## 2014-12-11 DIAGNOSIS — E1129 Type 2 diabetes mellitus with other diabetic kidney complication: Secondary | ICD-10-CM | POA: Diagnosis not present

## 2014-12-12 ENCOUNTER — Encounter: Payer: Medicare Other | Admitting: Vascular Surgery

## 2014-12-12 ENCOUNTER — Encounter (HOSPITAL_COMMUNITY): Payer: Medicare Other

## 2014-12-13 DIAGNOSIS — N186 End stage renal disease: Secondary | ICD-10-CM | POA: Diagnosis not present

## 2014-12-13 DIAGNOSIS — E1129 Type 2 diabetes mellitus with other diabetic kidney complication: Secondary | ICD-10-CM | POA: Diagnosis not present

## 2014-12-13 DIAGNOSIS — N2581 Secondary hyperparathyroidism of renal origin: Secondary | ICD-10-CM | POA: Diagnosis not present

## 2014-12-15 DIAGNOSIS — E1129 Type 2 diabetes mellitus with other diabetic kidney complication: Secondary | ICD-10-CM | POA: Diagnosis not present

## 2014-12-15 DIAGNOSIS — N186 End stage renal disease: Secondary | ICD-10-CM | POA: Diagnosis not present

## 2014-12-15 DIAGNOSIS — N2581 Secondary hyperparathyroidism of renal origin: Secondary | ICD-10-CM | POA: Diagnosis not present

## 2014-12-16 DIAGNOSIS — Z992 Dependence on renal dialysis: Secondary | ICD-10-CM | POA: Diagnosis not present

## 2014-12-16 DIAGNOSIS — N186 End stage renal disease: Secondary | ICD-10-CM | POA: Diagnosis not present

## 2014-12-17 ENCOUNTER — Ambulatory Visit (INDEPENDENT_AMBULATORY_CARE_PROVIDER_SITE_OTHER): Payer: Medicare Other

## 2014-12-17 VITALS — BP 196/82 | HR 71 | Temp 98.1°F | Resp 18

## 2014-12-17 DIAGNOSIS — M79606 Pain in leg, unspecified: Secondary | ICD-10-CM

## 2014-12-17 DIAGNOSIS — L89891 Pressure ulcer of other site, stage 1: Secondary | ICD-10-CM

## 2014-12-17 DIAGNOSIS — L97501 Non-pressure chronic ulcer of other part of unspecified foot limited to breakdown of skin: Secondary | ICD-10-CM

## 2014-12-17 DIAGNOSIS — E1140 Type 2 diabetes mellitus with diabetic neuropathy, unspecified: Secondary | ICD-10-CM

## 2014-12-17 DIAGNOSIS — I739 Peripheral vascular disease, unspecified: Secondary | ICD-10-CM

## 2014-12-17 NOTE — Patient Instructions (Signed)
ANTIBACTERIAL SOAP INSTRUCTIONS  THE DAY AFTER PROCEDURE  Please follow the instructions your doctor has marked.   Shower as usual. Before getting out, place a drop of antibacterial liquid soap (Dial) on a wet, clean washcloth.  Gently wipe washcloth over affected area.  Afterward, rinse the area with warm water.  Blot the area dry with a soft cloth and cover with antibiotic ointment (neosporin, polysporin, bacitracin) and band aid or gauze and tape  Place 3-4 drops of antibacterial liquid soap in a quart of warm tap water.  Submerge foot into water for 20 minutes.  If bandage was applied after your procedure, leave on to allow for easy lift off, then remove and continue with soak for the remaining time.  Next, blot area dry with a soft cloth and cover with a bandage.  Apply other medications as directed by your doctor,   Apply Silvadene and gauze dressing daily to the toes 12 and 3 as instructed. Avoid putting a tight bandage on maintain loosefitting socks and shoes.

## 2014-12-17 NOTE — Progress Notes (Signed)
   Subjective:    Patient ID: Garrett Salas, male    DOB: 07-Oct-1951, 64 y.o.   MRN: [id number]  HPI MY TOES ON MY RIGHT FOOT ARE PAINFUL AND THE PAIN IS GOING UP MY RIGHT LEG AND THERE WAS NO DRAINING LAST NIGHT    Review of Systems no new findings or systemic changes noted     Objective:   Physical Exam Lower extremity objective findings unchanged vascular status reveals nonpalpable pulses DP or PT patient scheduled to see vascular in [place] 2 weeks from now. There is a pink granular base of the hallux and second digit nail beds bilateral as well as distal tuft ulceration of the hallux with a dry eschar in place and no drainage from the distal tuft of the right hallux. There is no malodor no increased temperature minimal edema noted skin is paperthin and smooth with again ulcerations the nailbeds 1 into some discoloration of the third nailbed patient has pain and weakness extending from his toes feet and up into his legs patient asked about getting an x-ray over exam of the foot revealedosseous abnormalities likely arthropathy and osteopenic changes would be found patient is advised most of his pain is likely ischemic or circulation in nature. That his follow-up with vascular may find some improvement in her way to alleviate symptomology or improve is healing. Patient apparently is been using either Neosporin or Silvadene I suggest he stick with Silvadene only.       Assessment & Plan:  Assessment this time his diabetes with history of PVD and complications and renal failure patient does have ischemic ulcerations of the hallux distal tuft and the nailbeds hallux and second digit right foot the nailbeds of the hallux and second digit haven't seemed to have a pink granular base no malodor patient is completed his cephalexin antibiotic do not need any additional cephalexin L continue with Silvadene and gauze dressings and cleansing with soap and water daily as instructed keep appointment for  vascular studies 2 weeks from now. Follow-up in 2-3 weeks for reevaluation Silvadene and gauze dressing applied to the nailbeds distal ulcer site and a light gauze wrap is applied. Maintaining loosefitting socks and slippers at all times and weighs with the assistance of a cane. Reevaluate in 2-3 weeks  Harriet Masson DPM

## 2014-12-18 DIAGNOSIS — N2581 Secondary hyperparathyroidism of renal origin: Secondary | ICD-10-CM | POA: Diagnosis not present

## 2014-12-18 DIAGNOSIS — D631 Anemia in chronic kidney disease: Secondary | ICD-10-CM | POA: Diagnosis not present

## 2014-12-18 DIAGNOSIS — N186 End stage renal disease: Secondary | ICD-10-CM | POA: Diagnosis not present

## 2014-12-18 DIAGNOSIS — D509 Iron deficiency anemia, unspecified: Secondary | ICD-10-CM | POA: Diagnosis not present

## 2014-12-20 DIAGNOSIS — D509 Iron deficiency anemia, unspecified: Secondary | ICD-10-CM | POA: Diagnosis not present

## 2014-12-20 DIAGNOSIS — N2581 Secondary hyperparathyroidism of renal origin: Secondary | ICD-10-CM | POA: Diagnosis not present

## 2014-12-20 DIAGNOSIS — D631 Anemia in chronic kidney disease: Secondary | ICD-10-CM | POA: Diagnosis not present

## 2014-12-20 DIAGNOSIS — N186 End stage renal disease: Secondary | ICD-10-CM | POA: Diagnosis not present

## 2014-12-22 DIAGNOSIS — N186 End stage renal disease: Secondary | ICD-10-CM | POA: Diagnosis not present

## 2014-12-22 DIAGNOSIS — D509 Iron deficiency anemia, unspecified: Secondary | ICD-10-CM | POA: Diagnosis not present

## 2014-12-22 DIAGNOSIS — D631 Anemia in chronic kidney disease: Secondary | ICD-10-CM | POA: Diagnosis not present

## 2014-12-22 DIAGNOSIS — N2581 Secondary hyperparathyroidism of renal origin: Secondary | ICD-10-CM | POA: Diagnosis not present

## 2014-12-25 DIAGNOSIS — D631 Anemia in chronic kidney disease: Secondary | ICD-10-CM | POA: Diagnosis not present

## 2014-12-25 DIAGNOSIS — D509 Iron deficiency anemia, unspecified: Secondary | ICD-10-CM | POA: Diagnosis not present

## 2014-12-25 DIAGNOSIS — N2581 Secondary hyperparathyroidism of renal origin: Secondary | ICD-10-CM | POA: Diagnosis not present

## 2014-12-25 DIAGNOSIS — N186 End stage renal disease: Secondary | ICD-10-CM | POA: Diagnosis not present

## 2014-12-27 DIAGNOSIS — N2581 Secondary hyperparathyroidism of renal origin: Secondary | ICD-10-CM | POA: Diagnosis not present

## 2014-12-27 DIAGNOSIS — D631 Anemia in chronic kidney disease: Secondary | ICD-10-CM | POA: Diagnosis not present

## 2014-12-27 DIAGNOSIS — D509 Iron deficiency anemia, unspecified: Secondary | ICD-10-CM | POA: Diagnosis not present

## 2014-12-27 DIAGNOSIS — N186 End stage renal disease: Secondary | ICD-10-CM | POA: Diagnosis not present

## 2014-12-29 DIAGNOSIS — D631 Anemia in chronic kidney disease: Secondary | ICD-10-CM | POA: Diagnosis not present

## 2014-12-29 DIAGNOSIS — D509 Iron deficiency anemia, unspecified: Secondary | ICD-10-CM | POA: Diagnosis not present

## 2014-12-29 DIAGNOSIS — N2581 Secondary hyperparathyroidism of renal origin: Secondary | ICD-10-CM | POA: Diagnosis not present

## 2014-12-29 DIAGNOSIS — N186 End stage renal disease: Secondary | ICD-10-CM | POA: Diagnosis not present

## 2014-12-31 ENCOUNTER — Ambulatory Visit (INDEPENDENT_AMBULATORY_CARE_PROVIDER_SITE_OTHER): Payer: Medicare Other

## 2014-12-31 VITALS — BP 99/64 | HR 61 | Resp 18

## 2014-12-31 DIAGNOSIS — E1140 Type 2 diabetes mellitus with diabetic neuropathy, unspecified: Secondary | ICD-10-CM | POA: Diagnosis not present

## 2014-12-31 DIAGNOSIS — I70245 Atherosclerosis of native arteries of left leg with ulceration of other part of foot: Secondary | ICD-10-CM

## 2014-12-31 DIAGNOSIS — I739 Peripheral vascular disease, unspecified: Secondary | ICD-10-CM | POA: Diagnosis not present

## 2014-12-31 DIAGNOSIS — I999 Unspecified disorder of circulatory system: Secondary | ICD-10-CM

## 2014-12-31 DIAGNOSIS — L8991 Pressure ulcer of unspecified site, stage 1: Secondary | ICD-10-CM

## 2014-12-31 DIAGNOSIS — M79671 Pain in right foot: Secondary | ICD-10-CM

## 2014-12-31 DIAGNOSIS — I998 Other disorder of circulatory system: Secondary | ICD-10-CM | POA: Diagnosis not present

## 2014-12-31 DIAGNOSIS — M79606 Pain in leg, unspecified: Secondary | ICD-10-CM

## 2014-12-31 DIAGNOSIS — L97501 Non-pressure chronic ulcer of other part of unspecified foot limited to breakdown of skin: Secondary | ICD-10-CM

## 2014-12-31 MED ORDER — HYDROCODONE-ACETAMINOPHEN 10-325 MG PO TABS: 1.0000 | ORAL_TABLET | Freq: Four times a day (QID) | ORAL | Status: DC | PRN

## 2014-12-31 NOTE — Patient Instructions (Signed)
ANTIBACTERIAL SOAP INSTRUCTIONS  THE DAY AFTER PROCEDURE  Please follow the instructions your doctor has marked.   Shower as usual. Before getting out, place a drop of antibacterial liquid soap (Dial) on a wet, clean washcloth.  Gently wipe washcloth over affected area.  Afterward, rinse the area with warm water.  Blot the area dry with a soft cloth and cover with antibiotic ointment (neosporin, polysporin, bacitracin) and band aid or gauze and tape  Place 3-4 drops of antibacterial liquid soap in a quart of warm tap water.  Submerge foot into water for 20 minutes.  If bandage was applied after your procedure, leave on to allow for easy lift off, then remove and continue with soak for the remaining time.  Next, blot area dry with a soft cloth and cover with a bandage.    After washing the foot with soap and water dry and apply Silvadene and light gauze dressing as instructed.  Want to stress is very important that you keep your appointment for vascular evaluation and consult. These wounds are not likely to heal without some revascularization. You may be candidate for amputation based on your vascular results.

## 2014-12-31 NOTE — Progress Notes (Signed)
   Subjective:    Patient ID: Garrett Salas, male    DOB: 06-20-1951, 64 y.o.   MRN: [id number]  HPI MY TOES ARE NO BETTER AND THEY ARE WORSE ON THE RIGHT FOOT    Review of Systems no new findings or systemic changes noted    Objective:   Physical Exam 64 year old epicritic and recommended American male presents this time for follow-up of severe ischemic vascular compromise and ulceration of toes hallux second third and fourth toe showed dark discoloration the hallux had previous some granulation tissue however there is higher pigmentation and dark discoloration distal tuft of the hallux second toes on there is some slight excoriation third and fourth toes as well with some mild drainage hypersensitivity is present was a painful tender symptomatic. Patient was supposed dosing vascular however there was some rescheduling that had done and is scheduled for later this week I believe for the 17th or Wednesday of this week for vascular consult. Patient seems somewhat uncertain about his history however continues to have severe ischemic pain affecting his right foot and forefoot indicates his left foot started to bother him as well although not as severe again there is ulceration of the hallux second and third digits distal tuft's secondary. Peripheral vascular compromise and PVD also history of renal failure noted.       Assessment & Plan:  PVD renal failure and ischemic pain and ulceration of the hallux distal tuft and second and third digits and somewhat on the fourth digital of the right foot. Patient scheduled for vascular evaluation within 2 days dress the importance of keeping that appointment for you revascularization or possibly may be candidate for amputation patient Cates he's ready to have the foot removed pain and some symptoms. Cannot sleep due to the pain discomfort anything touches the foot is painful symptomatic. There is cleansed with all cleanse this time Silvadene and a light gauze  dressings applied to the right forefoot. Patient will follow-up keep his vascular evaluation appointment and follow-up appropriately whether revascularization or amputation is chosen. If needed follow-up for future palliative care as needed within 2 weeks revascularization were to be successful. Next  Harriet Masson DPM

## 2015-01-01 DIAGNOSIS — D631 Anemia in chronic kidney disease: Secondary | ICD-10-CM | POA: Diagnosis not present

## 2015-01-01 DIAGNOSIS — N2581 Secondary hyperparathyroidism of renal origin: Secondary | ICD-10-CM | POA: Diagnosis not present

## 2015-01-01 DIAGNOSIS — N186 End stage renal disease: Secondary | ICD-10-CM | POA: Diagnosis not present

## 2015-01-01 DIAGNOSIS — D509 Iron deficiency anemia, unspecified: Secondary | ICD-10-CM | POA: Diagnosis not present

## 2015-01-02 DIAGNOSIS — R030 Elevated blood-pressure reading, without diagnosis of hypertension: Secondary | ICD-10-CM | POA: Diagnosis not present

## 2015-01-02 DIAGNOSIS — I96 Gangrene, not elsewhere classified: Secondary | ICD-10-CM | POA: Diagnosis not present

## 2015-01-02 DIAGNOSIS — E118 Type 2 diabetes mellitus with unspecified complications: Secondary | ICD-10-CM | POA: Diagnosis not present

## 2015-01-02 DIAGNOSIS — Z992 Dependence on renal dialysis: Secondary | ICD-10-CM | POA: Diagnosis not present

## 2015-01-02 DIAGNOSIS — M85871 Other specified disorders of bone density and structure, right ankle and foot: Secondary | ICD-10-CM | POA: Diagnosis not present

## 2015-01-02 DIAGNOSIS — N186 End stage renal disease: Secondary | ICD-10-CM | POA: Diagnosis not present

## 2015-01-02 DIAGNOSIS — F17210 Nicotine dependence, cigarettes, uncomplicated: Secondary | ICD-10-CM | POA: Diagnosis not present

## 2015-01-02 DIAGNOSIS — S81801A Unspecified open wound, right lower leg, initial encounter: Secondary | ICD-10-CM | POA: Diagnosis not present

## 2015-01-02 DIAGNOSIS — I7389 Other specified peripheral vascular diseases: Secondary | ICD-10-CM | POA: Diagnosis not present

## 2015-01-03 ENCOUNTER — Encounter: Payer: Self-pay | Admitting: Vascular Surgery

## 2015-01-03 DIAGNOSIS — N2581 Secondary hyperparathyroidism of renal origin: Secondary | ICD-10-CM | POA: Diagnosis not present

## 2015-01-03 DIAGNOSIS — D631 Anemia in chronic kidney disease: Secondary | ICD-10-CM | POA: Diagnosis not present

## 2015-01-03 DIAGNOSIS — N186 End stage renal disease: Secondary | ICD-10-CM | POA: Diagnosis not present

## 2015-01-03 DIAGNOSIS — D509 Iron deficiency anemia, unspecified: Secondary | ICD-10-CM | POA: Diagnosis not present

## 2015-01-04 ENCOUNTER — Encounter: Payer: Self-pay | Admitting: Vascular Surgery

## 2015-01-04 ENCOUNTER — Ambulatory Visit (INDEPENDENT_AMBULATORY_CARE_PROVIDER_SITE_OTHER)
Admission: RE | Admit: 2015-01-04 | Discharge: 2015-01-04 | Disposition: A | Discharge status: Home / Self Care | Payer: Medicare Other | Source: Ambulatory Visit | Attending: Vascular Surgery | Admitting: Vascular Surgery

## 2015-01-04 ENCOUNTER — Ambulatory Visit (INDEPENDENT_AMBULATORY_CARE_PROVIDER_SITE_OTHER): Payer: Medicare Other | Admitting: Vascular Surgery

## 2015-01-04 ENCOUNTER — Other Ambulatory Visit: Payer: Self-pay

## 2015-01-04 VITALS — BP 161/60 | HR 72 | Temp 98.4°F | Ht 64.0 in | Wt 123.5 lb

## 2015-01-04 DIAGNOSIS — E1159 Type 2 diabetes mellitus with other circulatory complications: Secondary | ICD-10-CM

## 2015-01-04 DIAGNOSIS — I70245 Atherosclerosis of native arteries of left leg with ulceration of other part of foot: Secondary | ICD-10-CM

## 2015-01-04 DIAGNOSIS — I70269 Atherosclerosis of native arteries of extremities with gangrene, unspecified extremity: Secondary | ICD-10-CM

## 2015-01-04 DIAGNOSIS — E782 Mixed hyperlipidemia: Secondary | ICD-10-CM | POA: Insufficient documentation

## 2015-01-04 DIAGNOSIS — N186 End stage renal disease: Secondary | ICD-10-CM

## 2015-01-04 DIAGNOSIS — E785 Hyperlipidemia, unspecified: Secondary | ICD-10-CM | POA: Diagnosis not present

## 2015-01-04 DIAGNOSIS — E1151 Type 2 diabetes mellitus with diabetic peripheral angiopathy without gangrene: Secondary | ICD-10-CM | POA: Insufficient documentation

## 2015-01-04 DIAGNOSIS — Z992 Dependence on renal dialysis: Secondary | ICD-10-CM

## 2015-01-04 DIAGNOSIS — Z794 Long term (current) use of insulin: Secondary | ICD-10-CM

## 2015-01-04 MED ORDER — OXYCODONE HCL 5 MG PO TABS: 5.0000 mg | ORAL_TABLET | Freq: Three times a day (TID) | ORAL | Status: DC | PRN

## 2015-01-04 NOTE — Addendum Note (Signed)
Addended by: Mena Goes on: 01/04/2015 04:31 PM   Modules accepted: Orders

## 2015-01-04 NOTE — Progress Notes (Signed)
Referred by:  Estill Bamberg. Megan Salon, [address] [place], [place] [address]  Reason for referral: bilateral foot ischemia  History of Present Illness  Garrett Salas is a 64 y.o. (September 08, 1951) male who presents with chief complaint: feet hurt.  Onset of symptom occurred >2 years prior, intermittent claudication which is now rest pain.  Pain is described as constant aching, severity 10/10, and associated with rest.  Pt is unable to walk at this point.  Patient has attempted to treat this pain with rest and OTC medications.  He has come in outside ED for pain mgmt.  The patient has rest pain symptoms also and ischemic toes.  From the chart, their documented evidence as early as last June.  Atherosclerotic risk factors include: DM, HLD, ESRD, HTN, and Smoking.   Past Medical History  Diagnosis Date  . Diabetes mellitus     Type 2. Diagnosed in mid 56s. Currently insulin requiring  . Hyperlipidemia   . ESRD (end stage renal disease) on dialysis     due to diabetic nephropathy. T/Th/Sat  . Tobacco abuse   . Diabetic neuropathy   . Diabetic retinopathy     legally blind  . Hypertension   . Dialysis patient     Past Surgical History  Procedure Laterality Date  . None    . Cardiac catheterization  05/06/2011    no significant CAD, EF 55-60%, LVEDP :15   . Av fistula placement      History   Social History  . Marital Status: Married    Spouse Name: N/A  . Number of Children: 1  . Years of Education: N/A   Occupational History  . retired    Social History Main Topics  . Smoking status: Current Every Day Smoker -- 0.50 packs/day for 30 years    Types: Cigarettes  . Smokeless tobacco: Never Used  . Alcohol Use: No  . Drug Use: Yes    Special: Marijuana  . Sexual Activity: Not on file   Other Topics Concern  . Not on file   Social History Narrative    Family History  Problem Relation Age of Onset  . Heart disease    . Alcohol abuse    . Diabetes  Mother   . Diabetes Sister   . Diabetes Brother     Current Outpatient Prescriptions on File Prior to Visit  Medication Sig Dispense Refill  . amphetamine-dextroamphetamine (ADDERALL) 5 MG tablet     . aspirin 81 MG tablet Take 81 mg by mouth daily.      Marland Kitchen HYDROcodone-acetaminophen (NORCO) 10-325 MG per tablet Take 1 tablet by mouth every 6 (six) hours as needed. 60 tablet 0  . insulin aspart (NOVOLOG) 100 UNIT/ML injection as directed.      . insulin glargine (LANTUS) 100 UNIT/ML injection Inject 25 Units into the skin at bedtime.      Marland Kitchen LYRICA 75 MG capsule   0  . metoCLOPramide (REGLAN) 10 MG tablet Take 10 mg by mouth 4 (four) times daily -  before meals and at bedtime.      . sevelamer (RENVELA) 800 MG tablet Take 800 mg by mouth 3 (three) times daily with meals.      . silver sulfADIAZINE (SILVADENE) 1 % cream Apply 1 application topically daily. 50 g 0  . simvastatin (ZOCOR) 40 MG tablet Take 40 mg by mouth at bedtime.      . VENTOLIN HFA 108 (90 BASE) MCG/ACT inhaler     .  cephALEXin (KEFLEX) 500 MG capsule Take 1 capsule (500 mg total) by mouth 2 (two) times daily. (Patient not taking: Reported on 01/04/2015) 20 capsule 0  . ciprofloxacin (CIPRO) 250 MG tablet   0  . gabapentin (NEURONTIN) 400 MG capsule   5  . HUMULIN 70/30 KWIKPEN (70-30) 100 UNIT/ML PEN   5  . LYRICA 50 MG capsule     . ranitidine (ZANTAC) 150 MG tablet      No current facility-administered medications on file prior to visit.   No Known Allergies   REVIEW OF SYSTEMS:  (Positives checked otherwise negative)  CARDIOVASCULAR:  []  chest pain, []  chest pressure, []  palpitations, []  shortness of breath when laying flat, []  shortness of breath with exertion,  [x]  pain in feet when walking, [x]  pain in feet when laying flat, []  history of blood clot in veins (DVT), []  history of phlebitis, []  swelling in legs, []  varicose veins  PULMONARY:  []  productive cough, []  asthma, []  wheezing  NEUROLOGIC:  [x]  weakness  in arms or legs, []  numbness in arms or legs, []  difficulty speaking or slurred speech, []  temporary loss of vision in one eye, []  dizziness  HEMATOLOGIC:  []  bleeding problems, []  problems with blood clotting too easily  MUSCULOSKEL:  []  joint pain, []  joint swelling  GASTROINTEST:  []  vomiting blood, []  blood in stool     GENITOURINARY:  []  burning with urination, []  blood in urine  PSYCHIATRIC:  []  history of major depression  INTEGUMENTARY:  []  rashes, []  ulcers  CONSTITUTIONAL:  []  fever, []  chills   For VQI Use Only  PRE-ADM LIVING: Home  AMB STATUS: Wheelchair  CAD Sx: None  PRIOR CHF: None  STRESS TEST: [x]  No recent, [ ]  Normal, [ ]  + ischemia, [ ]  + MI, [ ]  Both   Physical Examination  Filed Vitals:   01/04/15 1213  BP: 161/60  Pulse: 72  Temp: 98.4 F (36.9 C)  TempSrc: Oral  Height: 5\' 4"  (1.626 m)  Weight: 123 lb 7.3 oz (56 kg)  SpO2: 100%   Body mass index is 21.18 kg/(m^2).  General: A&O x 3, WDWN, slumped in wheelchair  Head: [place]/AT  Ear/Nose/Throat: Hearing grossly intact, nares w/o erythema or drainage, oropharynx w/o Erythema/Exudate, Mallampati score: 3  Eyes: PERRLA, EOMI  Neck: Supple, no nuchal rigidity, no palpable LAD  Pulmonary: Sym exp, good air movt, CTAB, no rales, rhonchi, & wheezing  Cardiac: RRR, Nl S1, S2, no Murmurs, rubs or gallops  Vascular: Vessel Right Left  Radial Not Palpable Faintly Palpable  Ulnar Not Palpable Not Palpable  Brachial FaintlyPalpable Faintly Palpable  Carotid Not Palpable, without bruit Not Palpable, without bruit  Aorta Not palpable N/A  Femoral Prominently Palpable Faintly Palpable  Popliteal Not palpable Not palpable  PT Not Palpable Not Palpable  DP Not Palpable Not Palpable   Gastrointestinal: soft, NTND, -G/R, - HSM, - masses, - CVAT B  Musculoskeletal: M/S 5/5 throughout except not DF/PF in R foot due to pain, RLE: dry gangrene Great toe and 2nd distal phlanage, rest of toes appear  ischemic, LLE: toes appear ischemic  Neurologic: CN 2-12 intact , Pain and light touch intact in extremities , Motor exam as listed above  Psychiatric: Judgment intact, Mood & affect appropriate for pt's clinical situation  Dermatologic: See M/S exam for extremity exam, no rashes otherwise noted  Lymph : No Cervical, Axillary, or Inguinal lymphadenopathy   Non-Invasive Vascular Imaging  ABI (Date: 01/04/2015)  R: , DP:  absent, PT: mono, TBI: NR  L: [place], DP: absent, PT: mono, TBI: NR  Medical Decision Making  Joshuwa Messex is a 64 y.o. male who presents with: BLE critical limb ischemia with R foot gangrene and B leg rest pain   I discussed with the patient the natural history of critical limb ischemia: 25% require amputation in one year, 50% are able to maintain their limbs in one year, and 25-30% die in one year due to comorbidities. Given the limb threatening status of this patient, I recommend an aggressive work up including proceeding with an: Aortogram, Bilateral runoff I discussed with the patient the nature of angiographic procedures, especially the limited patencies of any endovascular intervention. The patient is aware of that the risks of an angiographic procedure include but are not limited to: bleeding, infection, access site complications, embolization, rupture of treated vessel, dissection, possible need for emergent surgical intervention, and possible need for surgical procedures to treat the patient's pathology. The patient is aware of the risks and agrees to proceed.  The procedure is scheduled for: Monday 22 FEB 15.  I discussed in depth with the patient the nature of atherosclerosis, and emphasized the importance of maximal medical management including strict control of blood pressure, blood glucose, and lipid levels, antiplatelet agents, obtaining regular exercise, and cessation of smoking.  The patient is aware that without maximal medical management the underlying  atherosclerotic disease process will progress, limiting the benefit of any interventions. The patient is currently on a statin:  Zocor.  The patient is currently on an anti-platelet: ASA.  Thank you for allowing [place] to participate in this patient's care.  Adele Barthel, MD [facility] Office: [phone] Pager: [phone]  01/04/2015, 12:58 PM

## 2015-01-05 DIAGNOSIS — D509 Iron deficiency anemia, unspecified: Secondary | ICD-10-CM | POA: Diagnosis not present

## 2015-01-05 DIAGNOSIS — D631 Anemia in chronic kidney disease: Secondary | ICD-10-CM | POA: Diagnosis not present

## 2015-01-05 DIAGNOSIS — N186 End stage renal disease: Secondary | ICD-10-CM | POA: Diagnosis not present

## 2015-01-05 DIAGNOSIS — N2581 Secondary hyperparathyroidism of renal origin: Secondary | ICD-10-CM | POA: Diagnosis not present

## 2015-01-06 MED ORDER — SODIUM CHLORIDE 0.9 % IJ SOLN: 3.0000 mL | INTRAMUSCULAR | Status: DC | PRN

## 2015-01-07 ENCOUNTER — Inpatient Hospital Stay (HOSPITAL_COMMUNITY)
Admission: RE | Admit: 2015-01-07 | Discharge: 2015-01-14 | DRG: 239 | Disposition: A | Discharge status: Home Health | Payer: Medicare Other | Source: Ambulatory Visit | Attending: Vascular Surgery | Admitting: Vascular Surgery

## 2015-01-07 ENCOUNTER — Encounter (HOSPITAL_COMMUNITY): Payer: Self-pay | Admitting: Vascular Surgery

## 2015-01-07 ENCOUNTER — Encounter (HOSPITAL_COMMUNITY)
Admission: RE | Disposition: A | Discharge status: Home Health | Payer: Medicare Other | Source: Ambulatory Visit | Attending: Vascular Surgery

## 2015-01-07 DIAGNOSIS — F068 Other specified mental disorders due to known physiological condition: Secondary | ICD-10-CM | POA: Diagnosis not present

## 2015-01-07 DIAGNOSIS — E785 Hyperlipidemia, unspecified: Secondary | ICD-10-CM | POA: Diagnosis present

## 2015-01-07 DIAGNOSIS — M79672 Pain in left foot: Secondary | ICD-10-CM | POA: Diagnosis not present

## 2015-01-07 DIAGNOSIS — I739 Peripheral vascular disease, unspecified: Secondary | ICD-10-CM | POA: Diagnosis not present

## 2015-01-07 DIAGNOSIS — N2581 Secondary hyperparathyroidism of renal origin: Secondary | ICD-10-CM | POA: Diagnosis present

## 2015-01-07 DIAGNOSIS — Z79899 Other long term (current) drug therapy: Secondary | ICD-10-CM | POA: Diagnosis not present

## 2015-01-07 DIAGNOSIS — I70269 Atherosclerosis of native arteries of extremities with gangrene, unspecified extremity: Secondary | ICD-10-CM | POA: Diagnosis not present

## 2015-01-07 DIAGNOSIS — I517 Cardiomegaly: Secondary | ICD-10-CM | POA: Diagnosis not present

## 2015-01-07 DIAGNOSIS — Z992 Dependence on renal dialysis: Secondary | ICD-10-CM

## 2015-01-07 DIAGNOSIS — D631 Anemia in chronic kidney disease: Secondary | ICD-10-CM | POA: Diagnosis not present

## 2015-01-07 DIAGNOSIS — R0600 Dyspnea, unspecified: Secondary | ICD-10-CM

## 2015-01-07 DIAGNOSIS — E11641 Type 2 diabetes mellitus with hypoglycemia with coma: Secondary | ICD-10-CM | POA: Diagnosis not present

## 2015-01-07 DIAGNOSIS — D696 Thrombocytopenia, unspecified: Secondary | ICD-10-CM | POA: Diagnosis present

## 2015-01-07 DIAGNOSIS — I70263 Atherosclerosis of native arteries of extremities with gangrene, bilateral legs: Secondary | ICD-10-CM | POA: Diagnosis present

## 2015-01-07 DIAGNOSIS — E1121 Type 2 diabetes mellitus with diabetic nephropathy: Secondary | ICD-10-CM | POA: Diagnosis present

## 2015-01-07 DIAGNOSIS — E11319 Type 2 diabetes mellitus with unspecified diabetic retinopathy without macular edema: Secondary | ICD-10-CM | POA: Diagnosis present

## 2015-01-07 DIAGNOSIS — L97819 Non-pressure chronic ulcer of other part of right lower leg with unspecified severity: Secondary | ICD-10-CM | POA: Diagnosis not present

## 2015-01-07 DIAGNOSIS — N186 End stage renal disease: Secondary | ICD-10-CM | POA: Diagnosis present

## 2015-01-07 DIAGNOSIS — I96 Gangrene, not elsewhere classified: Secondary | ICD-10-CM | POA: Diagnosis not present

## 2015-01-07 DIAGNOSIS — I70223 Atherosclerosis of native arteries of extremities with rest pain, bilateral legs: Secondary | ICD-10-CM | POA: Diagnosis not present

## 2015-01-07 DIAGNOSIS — H548 Legal blindness, as defined in USA: Secondary | ICD-10-CM | POA: Diagnosis present

## 2015-01-07 DIAGNOSIS — Z01810 Encounter for preprocedural cardiovascular examination: Secondary | ICD-10-CM

## 2015-01-07 DIAGNOSIS — I70245 Atherosclerosis of native arteries of left leg with ulceration of other part of foot: Secondary | ICD-10-CM | POA: Diagnosis not present

## 2015-01-07 DIAGNOSIS — E1159 Type 2 diabetes mellitus with other circulatory complications: Secondary | ICD-10-CM | POA: Diagnosis present

## 2015-01-07 DIAGNOSIS — E1140 Type 2 diabetes mellitus with diabetic neuropathy, unspecified: Secondary | ICD-10-CM | POA: Diagnosis present

## 2015-01-07 DIAGNOSIS — I10 Essential (primary) hypertension: Secondary | ICD-10-CM | POA: Diagnosis not present

## 2015-01-07 DIAGNOSIS — Z794 Long term (current) use of insulin: Secondary | ICD-10-CM

## 2015-01-07 DIAGNOSIS — I120 Hypertensive chronic kidney disease with stage 5 chronic kidney disease or end stage renal disease: Secondary | ICD-10-CM | POA: Diagnosis present

## 2015-01-07 DIAGNOSIS — D62 Acute posthemorrhagic anemia: Secondary | ICD-10-CM | POA: Diagnosis not present

## 2015-01-07 DIAGNOSIS — G9340 Encephalopathy, unspecified: Secondary | ICD-10-CM | POA: Diagnosis not present

## 2015-01-07 DIAGNOSIS — R404 Transient alteration of awareness: Secondary | ICD-10-CM | POA: Diagnosis not present

## 2015-01-07 DIAGNOSIS — I70261 Atherosclerosis of native arteries of extremities with gangrene, right leg: Secondary | ICD-10-CM | POA: Diagnosis not present

## 2015-01-07 DIAGNOSIS — R011 Cardiac murmur, unspecified: Secondary | ICD-10-CM | POA: Diagnosis not present

## 2015-01-07 DIAGNOSIS — R4182 Altered mental status, unspecified: Secondary | ICD-10-CM

## 2015-01-07 DIAGNOSIS — E162 Hypoglycemia, unspecified: Secondary | ICD-10-CM | POA: Diagnosis not present

## 2015-01-07 DIAGNOSIS — E1129 Type 2 diabetes mellitus with other diabetic kidney complication: Secondary | ICD-10-CM | POA: Diagnosis not present

## 2015-01-07 DIAGNOSIS — Z87891 Personal history of nicotine dependence: Secondary | ICD-10-CM

## 2015-01-07 HISTORY — PX: ABDOMINAL AORTAGRAM: SHX5454

## 2015-01-07 LAB — COMPREHENSIVE METABOLIC PANEL
ALT: 16 U/L (ref 0–53)
ANION GAP: 13 (ref 5–15)
AST: 27 U/L (ref 0–37)
Albumin: 3.3 g/dL — ABNORMAL LOW (ref 3.5–5.2)
Alkaline Phosphatase: 77 U/L (ref 39–117)
BUN: 33 mg/dL — AB (ref 6–23)
CALCIUM: 7.6 mg/dL — AB (ref 8.4–10.5)
CHLORIDE: 94 mmol/L — AB (ref 96–112)
CO2: 28 mmol/L (ref 19–32)
CREATININE: 8.73 mg/dL — AB (ref 0.50–1.35)
GFR, EST AFRICAN AMERICAN: 7 mL/min — AB (ref >90)
GFR, EST NON AFRICAN AMERICAN: 6 mL/min — AB (ref >90)
Glucose, Bld: 146 mg/dL — ABNORMAL HIGH (ref 70–99)
Potassium: 3.7 mmol/L (ref 3.5–5.1)
SODIUM: 135 mmol/L (ref 135–145)
TOTAL PROTEIN: 6.9 g/dL (ref 6.0–8.3)
Total Bilirubin: 0.7 mg/dL (ref 0.3–1.2)

## 2015-01-07 LAB — CBC
HCT: 31.3 % — ABNORMAL LOW (ref 39.0–52.0)
Hemoglobin: 10.2 g/dL — ABNORMAL LOW (ref 13.0–17.0)
MCH: 31.4 pg (ref 26.0–34.0)
MCHC: 32.6 g/dL (ref 30.0–36.0)
MCV: 96.3 fL (ref 78.0–100.0)
Platelets: 127 10*3/uL — ABNORMAL LOW (ref 150–400)
RBC: 3.25 MIL/uL — AB (ref 4.22–5.81)
RDW: 14.4 % (ref 11.5–15.5)
WBC: 5.9 10*3/uL (ref 4.0–10.5)

## 2015-01-07 LAB — POCT I-STAT, CHEM 8
BUN: 31 mg/dL — AB (ref 6–23)
CALCIUM ION: 0.83 mmol/L — AB (ref 1.13–1.30)
Chloride: 95 mmol/L — ABNORMAL LOW (ref 96–112)
Creatinine, Ser: 7.5 mg/dL — ABNORMAL HIGH (ref 0.50–1.35)
GLUCOSE: 75 mg/dL (ref 70–99)
HCT: 35 % — ABNORMAL LOW (ref 39.0–52.0)
Hemoglobin: 11.9 g/dL — ABNORMAL LOW (ref 13.0–17.0)
POTASSIUM: 3.6 mmol/L (ref 3.5–5.1)
Sodium: 136 mmol/L (ref 135–145)
TCO2: 24 mmol/L (ref 0–100)

## 2015-01-07 LAB — PROTIME-INR
INR: 1.08 (ref 0.00–1.49)
PROTHROMBIN TIME: 14.1 s (ref 11.6–15.2)

## 2015-01-07 LAB — GLUCOSE, CAPILLARY: Glucose-Capillary: 73 mg/dL (ref 70–99)

## 2015-01-07 SURGERY — ABDOMINAL AORTAGRAM

## 2015-01-07 MED ORDER — SODIUM CHLORIDE 0.9 % IV SOLN: 250.0000 mL | INTRAVENOUS | Status: DC | PRN

## 2015-01-07 MED ORDER — ASPIRIN EC 81 MG PO TBEC
81.0000 mg | DELAYED_RELEASE_TABLET | Freq: Every day | ORAL | Status: DC
  Administered 2015-01-08 – 2015-01-14 (×5): 81 mg via ORAL
  Filled 2015-01-07 (×8): qty 1

## 2015-01-07 MED ORDER — HEPARIN SODIUM (PORCINE) 5000 UNIT/ML IJ SOLN: 5000.0000 [IU] | Freq: Three times a day (TID) | INTRAMUSCULAR | Status: DC

## 2015-01-07 MED ORDER — SILVER SULFADIAZINE 1 % EX CREA
1.0000 "application " | TOPICAL_CREAM | Freq: Every day | CUTANEOUS | Status: DC
  Filled 2015-01-07: qty 85

## 2015-01-07 MED ORDER — MORPHINE SULFATE 2 MG/ML IJ SOLN
2.0000 mg | INTRAMUSCULAR | Status: DC | PRN
  Administered 2015-01-07: 2 mg via INTRAVENOUS

## 2015-01-07 MED ORDER — LABETALOL HCL 5 MG/ML IV SOLN
INTRAVENOUS | Status: AC
  Filled 2015-01-07: qty 4

## 2015-01-07 MED ORDER — LIDOCAINE HCL (PF) 1 % IJ SOLN
INTRAMUSCULAR | Status: AC
  Filled 2015-01-07: qty 30

## 2015-01-07 MED ORDER — INSULIN ASPART 100 UNIT/ML ~~LOC~~ SOLN
0.0000 [IU] | Freq: Three times a day (TID) | SUBCUTANEOUS | Status: DC
  Administered 2015-01-10: 2 [IU] via SUBCUTANEOUS
  Administered 2015-01-11: 3 [IU] via SUBCUTANEOUS
  Administered 2015-01-12 – 2015-01-13 (×3): 5 [IU] via SUBCUTANEOUS
  Administered 2015-01-14: 3 [IU] via SUBCUTANEOUS

## 2015-01-07 MED ORDER — LABETALOL HCL 5 MG/ML IV SOLN
10.0000 mg | INTRAVENOUS | Status: AC | PRN
  Administered 2015-01-07 (×4): 10 mg via INTRAVENOUS
  Filled 2015-01-07 (×4): qty 4

## 2015-01-07 MED ORDER — MORPHINE SULFATE 2 MG/ML IJ SOLN
INTRAMUSCULAR | Status: AC
  Filled 2015-01-07: qty 1

## 2015-01-07 MED ORDER — SEVELAMER CARBONATE 800 MG PO TABS
3200.0000 mg | ORAL_TABLET | Freq: Three times a day (TID) | ORAL | Status: DC
  Administered 2015-01-07 – 2015-01-10 (×5): 3200 mg via ORAL
  Filled 2015-01-07 (×15): qty 4

## 2015-01-07 MED ORDER — MIDAZOLAM HCL 2 MG/2ML IJ SOLN
INTRAMUSCULAR | Status: AC
  Filled 2015-01-07: qty 2

## 2015-01-07 MED ORDER — GUAIFENESIN-DM 100-10 MG/5ML PO SYRP
15.0000 mL | ORAL_SOLUTION | ORAL | Status: DC | PRN
  Filled 2015-01-07: qty 15

## 2015-01-07 MED ORDER — SIMVASTATIN 40 MG PO TABS
40.0000 mg | ORAL_TABLET | Freq: Every day | ORAL | Status: DC
  Administered 2015-01-07 – 2015-01-13 (×7): 40 mg via ORAL
  Filled 2015-01-07 (×8): qty 1

## 2015-01-07 MED ORDER — METOPROLOL TARTRATE 1 MG/ML IV SOLN: 2.0000 mg | INTRAVENOUS | Status: DC | PRN

## 2015-01-07 MED ORDER — SILVER SULFADIAZINE 1 % EX CREA
1.0000 "application " | TOPICAL_CREAM | Freq: Every day | CUTANEOUS | Status: DC | PRN
  Filled 2015-01-07: qty 85

## 2015-01-07 MED ORDER — OXYCODONE HCL 5 MG PO TABS
5.0000 mg | ORAL_TABLET | Freq: Three times a day (TID) | ORAL | Status: DC | PRN
Start: 2015-01-07 — End: 2015-01-07

## 2015-01-07 MED ORDER — INSULIN ISOPHANE & REGULAR (HUMAN 70-30)100 UNIT/ML KWIKPEN: 5.0000 [IU] | PEN_INJECTOR | Freq: Two times a day (BID) | SUBCUTANEOUS | Status: DC | PRN

## 2015-01-07 MED ORDER — ALBUTEROL SULFATE HFA 108 (90 BASE) MCG/ACT IN AERS: 2.0000 | INHALATION_SPRAY | Freq: Four times a day (QID) | RESPIRATORY_TRACT | Status: DC | PRN

## 2015-01-07 MED ORDER — OXYCODONE-ACETAMINOPHEN 5-325 MG PO TABS
1.0000 | ORAL_TABLET | ORAL | Status: DC | PRN
Start: 2015-01-07 — End: 2015-01-07

## 2015-01-07 MED ORDER — HYDRALAZINE HCL 20 MG/ML IJ SOLN: 10.0000 mg | INTRAMUSCULAR | Status: DC | PRN

## 2015-01-07 MED ORDER — MORPHINE SULFATE 2 MG/ML IJ SOLN
2.0000 mg | INTRAMUSCULAR | Status: DC | PRN
  Administered 2015-01-08: 4 mg via INTRAVENOUS
  Administered 2015-01-09 – 2015-01-10 (×3): 2 mg via INTRAVENOUS
  Administered 2015-01-10: 4 mg via INTRAVENOUS
  Administered 2015-01-11: 2 mg via INTRAVENOUS
  Filled 2015-01-07 (×4): qty 1

## 2015-01-07 MED ORDER — INSULIN GLARGINE 100 UNIT/ML ~~LOC~~ SOLN
25.0000 [IU] | Freq: Every day | SUBCUTANEOUS | Status: DC
  Administered 2015-01-07 – 2015-01-10 (×2): 25 [IU] via SUBCUTANEOUS
  Filled 2015-01-07 (×4): qty 0.25

## 2015-01-07 MED ORDER — SODIUM CHLORIDE 0.9 % IJ SOLN: 3.0000 mL | INTRAMUSCULAR | Status: DC | PRN

## 2015-01-07 MED ORDER — ACETAMINOPHEN 325 MG PO TABS: 325.0000 mg | ORAL_TABLET | ORAL | Status: DC | PRN

## 2015-01-07 MED ORDER — HEPARIN SODIUM (PORCINE) 5000 UNIT/ML IJ SOLN
5000.0000 [IU] | Freq: Three times a day (TID) | INTRAMUSCULAR | Status: DC
  Administered 2015-01-07 – 2015-01-13 (×14): 5000 [IU] via SUBCUTANEOUS
  Filled 2015-01-07 (×23): qty 1

## 2015-01-07 MED ORDER — ACETAMINOPHEN 325 MG PO TABS: 650.0000 mg | ORAL_TABLET | ORAL | Status: DC | PRN

## 2015-01-07 MED ORDER — ONDANSETRON HCL 4 MG/2ML IJ SOLN: 4.0000 mg | Freq: Four times a day (QID) | INTRAMUSCULAR | Status: DC | PRN

## 2015-01-07 MED ORDER — CARVEDILOL 6.25 MG PO TABS
6.2500 mg | ORAL_TABLET | Freq: Two times a day (BID) | ORAL | Status: DC
  Administered 2015-01-07 – 2015-01-13 (×9): 6.25 mg via ORAL
  Filled 2015-01-07 (×21): qty 1

## 2015-01-07 MED ORDER — FAMOTIDINE 20 MG PO TABS
20.0000 mg | ORAL_TABLET | Freq: Every day | ORAL | Status: DC
  Administered 2015-01-08 – 2015-01-14 (×5): 20 mg via ORAL
  Filled 2015-01-07 (×8): qty 1

## 2015-01-07 MED ORDER — HEPARIN (PORCINE) IN NACL 2-0.9 UNIT/ML-% IJ SOLN
INTRAMUSCULAR | Status: AC
  Filled 2015-01-07: qty 1000

## 2015-01-07 MED ORDER — AMPHETAMINE-DEXTROAMPHETAMINE 10 MG PO TABS
5.0000 mg | ORAL_TABLET | Freq: Every day | ORAL | Status: DC
  Administered 2015-01-08 – 2015-01-14 (×6): 5 mg via ORAL
  Filled 2015-01-07 (×8): qty 1

## 2015-01-07 MED ORDER — LISINOPRIL 20 MG PO TABS
20.0000 mg | ORAL_TABLET | Freq: Every day | ORAL | Status: DC
  Administered 2015-01-08 – 2015-01-13 (×5): 20 mg via ORAL
  Filled 2015-01-07 (×8): qty 1

## 2015-01-07 MED ORDER — PREGABALIN 75 MG PO CAPS
75.0000 mg | ORAL_CAPSULE | Freq: Three times a day (TID) | ORAL | Status: DC
  Administered 2015-01-07 – 2015-01-12 (×10): 75 mg via ORAL
  Filled 2015-01-07 (×10): qty 1

## 2015-01-07 MED ORDER — HYDRALAZINE HCL 20 MG/ML IJ SOLN
5.0000 mg | INTRAMUSCULAR | Status: AC | PRN
  Administered 2015-01-09 (×2): 5 mg via INTRAVENOUS
  Filled 2015-01-07 (×2): qty 1

## 2015-01-07 MED ORDER — SODIUM CHLORIDE 0.9 % IJ SOLN
3.0000 mL | Freq: Two times a day (BID) | INTRAMUSCULAR | Status: DC
  Administered 2015-01-07 – 2015-01-08 (×3): 3 mL via INTRAVENOUS

## 2015-01-07 MED ORDER — FENTANYL CITRATE 0.05 MG/ML IJ SOLN
INTRAMUSCULAR | Status: AC
  Filled 2015-01-07: qty 2

## 2015-01-07 MED ORDER — ACETAMINOPHEN 650 MG RE SUPP: 325.0000 mg | RECTAL | Status: DC | PRN

## 2015-01-07 MED ORDER — INSULIN GLARGINE 100 UNITS/ML SOLOSTAR PEN: 25.0000 [IU] | PEN_INJECTOR | Freq: Every day | SUBCUTANEOUS | Status: DC

## 2015-01-07 MED ORDER — METOCLOPRAMIDE HCL 5 MG PO TABS
5.0000 mg | ORAL_TABLET | Freq: Three times a day (TID) | ORAL | Status: DC
  Administered 2015-01-07 – 2015-01-14 (×22): 5 mg via ORAL
  Filled 2015-01-07 (×31): qty 1

## 2015-01-07 MED ORDER — PHENOL 1.4 % MT LIQD
1.0000 | OROMUCOSAL | Status: DC | PRN
  Filled 2015-01-07: qty 177

## 2015-01-07 MED ORDER — HYDRALAZINE HCL 20 MG/ML IJ SOLN
INTRAMUSCULAR | Status: AC
  Filled 2015-01-07: qty 1

## 2015-01-07 MED ORDER — OXYCODONE-ACETAMINOPHEN 5-325 MG PO TABS
1.0000 | ORAL_TABLET | ORAL | Status: DC | PRN
Start: 2015-01-07 — End: 2015-01-11
  Administered 2015-01-08: 2 via ORAL
  Filled 2015-01-07: qty 2

## 2015-01-07 MED ORDER — SODIUM CHLORIDE 0.9 % IJ SOLN
3.0000 mL | Freq: Two times a day (BID) | INTRAMUSCULAR | Status: DC
Start: 2015-01-07 — End: 2015-01-09
  Administered 2015-01-07 – 2015-01-08 (×2): 3 mL via INTRAVENOUS

## 2015-01-07 NOTE — Progress Notes (Signed)
Site area: right groin a 5 french arterial sheath was removed  Site Prior to Removal:  Level 0  Pressure Applied For 20 MINUTES    Minutes Beginning at 1510p  Manual:   Yes.    Patient Status During Pull:  stable  Post Pull Groin Site:  Level 0  Post Pull Instructions Given:  Yes.    Post Pull Pulses Present:  Yes.    Dressing Applied:  Yes.    Comments:  VS remain stable during sheath pull.  Pt continue to have 10 out of 10 right foot pain intermittently

## 2015-01-07 NOTE — Interval H&P Note (Signed)
[facility]  History and Physical Update  The patient was interviewed and re-examined.  The patient's previous History and Physical has been reviewed and is unchanged from my consult.  There is no change in the plan of care: aortogram, bilateral leg runoff.  Adele Barthel, MD [facility] Office: [phone] Pager: [phone]  01/07/2015, 11:22 AM

## 2015-01-07 NOTE — Op Note (Signed)
OPERATIVE NOTE   PROCEDURE: 1.  Bilateral common femoral artery cannulation under ultrasound guidance 2.  Placement of catheter in aorta 3.  Aortogram 4.  Second order arterial selection 5.  Left leg runoff 6.  Right leg runoff  PRE-OPERATIVE DIAGNOSIS: Right foot gangrene, bilateral leg rest pain  POST-OPERATIVE DIAGNOSIS: same as above   SURGEON: Adele Barthel, MD  ANESTHESIA: conscious sedation  ESTIMATED BLOOD LOSS: 50 cc  CONTRAST: 110 cc  FINDING(S):  Aorta:  Patent, heavily calcified throughout  Superior mesenteric artery: Patent Celiac artery: Patent   Right Left  RA Not visualized Not visualized  CIA Patent, heavily calcified Patent, heavily calcified  EIA Patent, heavily calcified Patent, heavily calcified, <50% stenosis  IIA Patent Patent  CFA Patent Patent, heavily calcified  SFA Heavily diseased with multiple segmental occlusions Patent with multiple grade grade stenoses, calcified throughout  PFA Patent Patent, calcfied, one branch >90% stenosis  Pop Reconstitutes segments but heavily diseased and occludes at knee Occluded  Trif Reconstitutes from collaterals, >90% proximal tibioperoneal trunk stenosis Reconstitutes from collaterals diseased proximal tibioperoneal trunk   AT Reconstitutes from collaterals, dwindles distally Reconstitutes from collaterals, occludes distally  Pero Originates from reconstituted tibioperoneal trunk, miniscule distally Originates from reconstituted tibioperoneal trunk, miniscule distally  PT Originates from reconstituted tibioperoneal trunk, dominant runoff; calcified throughout Originates from reconstituted tibioperoneal trunk, dominant runoff   Right foot: no flow to anterior toes  SPECIMEN(S):  none  INDICATIONS:   Garrett Salas is a 64 y.o. male who presents with right foot gangrene.  The patient presents for: aortogram, bilateral leg runoff.  I discussed with the patient the nature of angiographic procedures,  especially the limited patencies of any endovascular intervention.  The patient is aware of that the risks of an angiographic procedure include but are not limited to: bleeding, infection, access site complications, renal failure, embolization, rupture of vessel, dissection, possible need for emergent surgical intervention, possible need for surgical procedures to treat the patient's pathology, and stroke and death.  The patient is aware of the risks and agrees to proceed.  DESCRIPTION: After full informed consent was obtained from the patient, the patient was brought back to the angiography suite.  The patient was placed supine upon the angiography table and connected to monitoring equipment.  The patient was then given conscious sedation, the amounts of which are documented in the patient's chart.  The patient was prepped and drape in the standard fashion for an angiographic procedure.  At this point, attention was turned to the left groin.  Under ultrasound guidance,  The subcutaneous tissue surrounding the left common femoral artery was anesthesized with 1% lidocaine with epinephrine.  The artery was then cannulated with a 18 gauge needle with great difficulty due to severe calcification of the left common femoral artery.  The wire would not pass up the artery due to severe disease.  I pulled the needle and held pressure for 3 minutes.    I turned my attention to the right groin.  Under ultrasound guidance, the subcutaneous tissue surrounding the right common femoral artery was anesthesized with 1% lidocaine with epinephrine.  The artery was then cannulated with a micropuncture needle.  The microwire was advanced into the iliac arterial system.  The needle was exchanged for a microsheath, which was loaded into the common femoral artery over the wire.  The microwire was exchanged for a [facility] wire which was advanced into the aorta.  The microsheath was then exchanged for a 5-Fr sheath  which was loaded into  the common femoral artery.  The Omniflush catheter was then loaded over the wire up to the level of L1.  The catheter was connected to the power injector circuit.  After de-airring and de-clotting the circuit, a power injector aortogram was completed.  The findings are as listed above.    The [facility] wire was replaced in the catheter, and using the [place] and Omniflush catheter, the left common iliac artery was selected.  The catheter and wire were advanced into the external iliac artery.  An automated left leg runoff was completed after de-airring and de-clotting the circuit.  The findings are as listed above.  The [facility] wire was replaced in the catheter to straighten the crook of the catheter.  Both were removed together from the sheath.  The right sheath was aspirated and no clot was present.  The sheath was connected to the power injector circuit.  An automated right leg runoff was completed after de-airring and de-clotting the circuit.  The findings are as listed above.  A lateral right foot projection was completed.  The sheath was aspirated.  No clots were present and the sheath was reloaded with heparinized saline.    Based on the images, this patient needs: attempt at right femoral to posterior tibial artery bypass, right first and second toe amputation vs below-knee amputation.  This patient's tibial arteries are calcified, so a fem-tib may not be possible.  COMPLICATIONS: none  CONDITION: stable   Adele Barthel, MD [facility] Office: [phone] Pager: [phone]  01/07/2015, 2:50 PM

## 2015-01-07 NOTE — H&P (View-Only) (Signed)
Referred by:  Estill Bamberg. Megan Salon, [place],  [id number]  Reason for referral: bilateral foot ischemia  History of Present Illness  Garrett Salas is a 64 y.o. ([birth year]) male who presents with chief complaint: feet hurt.  Onset of symptom occurred >2 years prior, intermittent claudication which is now rest pain.  Pain is described as constant aching, severity 10/10, and associated with rest.  Pt is unable to walk at this point.  Patient has attempted to treat this pain with rest and OTC medications.  He has come in outside ED for pain mgmt.  The patient has rest pain symptoms also and ischemic toes.  From the chart, their documented evidence as early as last June.  Atherosclerotic risk factors include: DM, HLD, ESRD, HTN, and Smoking.   Past Medical History  Diagnosis Date  . Diabetes mellitus     Type 2. Diagnosed in mid 32s. Currently insulin requiring  . Hyperlipidemia   . ESRD (end stage renal disease) on dialysis     due to diabetic nephropathy. T/Th/Sat  . Tobacco abuse   . Diabetic neuropathy   . Diabetic retinopathy     legally blind  . Hypertension   . Dialysis patient     Past Surgical History  Procedure Laterality Date  . None    . Cardiac catheterization  05/06/2011    no significant CAD, EF 55-60%, LVEDP :15   . Av fistula placement      History   Social History  . Marital Status: Married    Spouse Name: N/A  . Number of Children: 1  . Years of Education: N/A   Occupational History  . retired    Social History Main Topics  . Smoking status: Current Every Day Smoker -- 0.50 packs/day for 30 years    Types: Cigarettes  . Smokeless tobacco: Never Used  . Alcohol Use: No  . Drug Use: Yes    Special: Marijuana  . Sexual Activity: Not on file   Other Topics Concern  . Not on file   Social History Narrative    Family History  Problem Relation Age of Onset  . Heart disease    . Alcohol abuse    . Diabetes  Mother   . Diabetes Sister   . Diabetes Brother     Current Outpatient Prescriptions on File Prior to Visit  Medication Sig Dispense Refill  . amphetamine-dextroamphetamine (ADDERALL) 5 MG tablet     . aspirin 81 MG tablet Take 81 mg by mouth daily.      Marland Kitchen HYDROcodone-acetaminophen (NORCO) 10-325 MG per tablet Take 1 tablet by mouth every 6 (six) hours as needed. 60 tablet 0  . insulin aspart (NOVOLOG) 100 UNIT/ML injection as directed.      . insulin glargine (LANTUS) 100 UNIT/ML injection Inject 25 Units into the skin at bedtime.      Marland Kitchen LYRICA 75 MG capsule   0  . metoCLOPramide (REGLAN) 10 MG tablet Take 10 mg by mouth 4 (four) times daily -  before meals and at bedtime.      . sevelamer (RENVELA) 800 MG tablet Take 800 mg by mouth 3 (three) times daily with meals.      . silver sulfADIAZINE (SILVADENE) 1 % cream Apply 1 application topically daily. 50 g 0  . simvastatin (ZOCOR) 40 MG tablet Take 40 mg by mouth at bedtime.      . VENTOLIN HFA 108 (90 BASE) MCG/ACT inhaler     .  cephALEXin (KEFLEX) 500 MG capsule Take 1 capsule (500 mg total) by mouth 2 (two) times daily. (Patient not taking: Reported on 01/04/2015) 20 capsule 0  . ciprofloxacin (CIPRO) 250 MG tablet   0  . gabapentin (NEURONTIN) 400 MG capsule   5  . HUMULIN 70/30 KWIKPEN (70-30) 100 UNIT/ML PEN   5  . LYRICA 50 MG capsule     . ranitidine (ZANTAC) 150 MG tablet      No current facility-administered medications on file prior to visit.   No Known Allergies   REVIEW OF SYSTEMS:  (Positives checked otherwise negative)  CARDIOVASCULAR:  []  chest pain, []  chest pressure, []  palpitations, []  shortness of breath when laying flat, []  shortness of breath with exertion,  [x]  pain in feet when walking, [x]  pain in feet when laying flat, []  history of blood clot in veins (DVT), []  history of phlebitis, []  swelling in legs, []  varicose veins  PULMONARY:  []  productive cough, []  asthma, []  wheezing  NEUROLOGIC:  [x]  weakness  in arms or legs, []  numbness in arms or legs, []  difficulty speaking or slurred speech, []  temporary loss of vision in one eye, []  dizziness  HEMATOLOGIC:  []  bleeding problems, []  problems with blood clotting too easily  MUSCULOSKEL:  []  joint pain, []  joint swelling  GASTROINTEST:  []  vomiting blood, []  blood in stool     GENITOURINARY:  []  burning with urination, []  blood in urine  PSYCHIATRIC:  []  history of major depression  INTEGUMENTARY:  []  rashes, []  ulcers  CONSTITUTIONAL:  []  fever, []  chills   For VQI Use Only  PRE-ADM LIVING: Home  AMB STATUS: Wheelchair  CAD Sx: None  PRIOR CHF: None  STRESS TEST: [x]  No recent, [ ]  Normal, [ ]  + ischemia, [ ]  + MI, [ ]  Both   Physical Examination  Filed Vitals:   01/04/15 1213  BP: 161/60  Pulse: 72  Temp: 98.4 F (36.9 C)  TempSrc: Oral  Height: 5\' 4"  (1.626 m)  Weight: 123 lb 7.3 oz (56 kg)  SpO2: 100%   Body mass index is 21.18 kg/(m^2).  General: A&O x 3, WDWN, slumped in wheelchair  Head: [place]/AT  Ear/Nose/Throat: Hearing grossly intact, nares w/o erythema or drainage, oropharynx w/o Erythema/Exudate, Mallampati score: 3  Eyes: PERRLA, EOMI  Neck: Supple, no nuchal rigidity, no palpable LAD  Pulmonary: Sym exp, good air movt, CTAB, no rales, rhonchi, & wheezing  Cardiac: RRR, Nl S1, S2, no Murmurs, rubs or gallops  Vascular: Vessel Right Left  Radial Not Palpable Faintly Palpable  Ulnar Not Palpable Not Palpable  Brachial FaintlyPalpable Faintly Palpable  Carotid Not Palpable, without bruit Not Palpable, without bruit  Aorta Not palpable N/A  Femoral Prominently Palpable Faintly Palpable  Popliteal Not palpable Not palpable  PT Not Palpable Not Palpable  DP Not Palpable Not Palpable   Gastrointestinal: soft, NTND, -G/R, - HSM, - masses, - CVAT B  Musculoskeletal: M/S 5/5 throughout except not DF/PF in R foot due to pain, RLE: dry gangrene Great toe and 2nd distal phlanage, rest of toes appear  ischemic, LLE: toes appear ischemic  Neurologic: CN 2-12 intact , Pain and light touch intact in extremities , Motor exam as listed above  Psychiatric: Judgment intact, Mood & affect appropriate for pt's clinical situation  Dermatologic: See M/S exam for extremity exam, no rashes otherwise noted  Lymph : No Cervical, Axillary, or Inguinal lymphadenopathy   Non-Invasive Vascular Imaging  ABI (Date: 01/04/2015)  R: [place], DP:  absent, PT: mono, TBI: NR  L: [place], DP: absent, PT: mono, TBI: NR  Medical Decision Making  Garrett Salas is a 64 y.o. male who presents with: BLE critical limb ischemia with R foot gangrene and B leg rest pain   I discussed with the patient the natural history of critical limb ischemia: 25% require amputation in one year, 50% are able to maintain their limbs in one year, and 25-30% die in one year due to comorbidities. Given the limb threatening status of this patient, I recommend an aggressive work up including proceeding with an: Aortogram, Bilateral runoff I discussed with the patient the nature of angiographic procedures, especially the limited patencies of any endovascular intervention. The patient is aware of that the risks of an angiographic procedure include but are not limited to: bleeding, infection, access site complications, embolization, rupture of treated vessel, dissection, possible need for emergent surgical intervention, and possible need for surgical procedures to treat the patient's pathology. The patient is aware of the risks and agrees to proceed.  The procedure is scheduled for: Monday 22 FEB 15.  I discussed in depth with the patient the nature of atherosclerosis, and emphasized the importance of maximal medical management including strict control of blood pressure, blood glucose, and lipid levels, antiplatelet agents, obtaining regular exercise, and cessation of smoking.  The patient is aware that without maximal medical management the underlying  atherosclerotic disease process will progress, limiting the benefit of any interventions. The patient is currently on a statin:  Zocor.  The patient is currently on an anti-platelet: ASA.  Thank you for allowing [place] to participate in this patient's care.  Adele Barthel, MD [facility] Office: [phone] Pager: [phone]  01/04/2015, 12:58 PM

## 2015-01-07 NOTE — Consult Note (Signed)
Patient ID: Garrett Salas MRN: [id number], DOB/AGE: 01/26/1951   Admit date: 01/07/2015   Reason for Consult: Pre-op Clearance Referring MD: Dr. Bridgett Larsson   Primary Physician: [name], MD Primary Cardiologist: Dr. April Holding  Pt. Profile: 64 y/o male with history of HTN, HLD, DM, ESRD on HD, and h/o normal cath in 2012 with NL LFV, admitted for PV angiogram to evaluate for PVD in the setting of right critical limb ischemia w/ gangrene. Will require surgical revascularization and possible toe vs BKA.   Problem List  Past Medical History  Diagnosis Date  . Diabetes mellitus     Type 2. Diagnosed in mid 19s. Currently insulin requiring  . Hyperlipidemia   . ESRD (end stage renal disease) on dialysis     due to diabetic nephropathy. T/Th/Sat  . Tobacco abuse   . Diabetic neuropathy   . Diabetic retinopathy     legally blind  . Hypertension   . Dialysis patient     Past Surgical History  Procedure Laterality Date  . None    . Cardiac catheterization  05/06/2011    no significant CAD, EF 55-60%, LVEDP :15   . Av fistula placement       Allergies  No Known Allergies  HPI The patient is a 64 y/o male, last followed by Dr. Fletcher Anon in 2012. During that time he was evaluated for dyspnea and chest pain. NST was apparently abnormal leading a LHC by Dr. April Holding. This surprisingly showed no significant coronary diease and normal LV function with an EF of 60%. His other medical history includes DM, HTN, HLD, tobacco abuse, ESRD on HD, and PVD. He reports that he quit smoking 1 month ago. He was recently referred to Dr. Bridgett Larsson for evaluation regarding bilateral critical limb ischemia with right foot gangrene and bilateral leg resting pain. Today, he presented to [facility] to undergo planned angiogram with Dr. Bridgett Larsson. Based on his assessment, the patient will need attempt at right femoral to posterior tibial artery bypass, as well as possible right first and second toe amputations vs BKA. Cardiology  has been consulted for pre-operative clearance.   The patient denies any chest pain and no dyspnea. His only complaints is fatigue with physical activity. No orthopnea, PND or LEE. No syncope/ near syncope. His EKG today demonstrates NSR without any objective signs of ischemia. His BP is significantly elevated at > [id number] systolic. IV labetalol has been ordered. HR in the 60s.    Home Medications  Prior to Admission medications   Medication Sig Start Date End Date Taking? Authorizing Provider  albuterol (PROVENTIL HFA;VENTOLIN HFA) 108 (90 BASE) MCG/ACT inhaler Inhale 2 puffs into the lungs every 6 (six) hours as needed for wheezing or shortness of breath.   Yes Historical Provider, MD  amphetamine-dextroamphetamine (ADDERALL) 5 MG tablet Take 5 mg by mouth daily.  11/21/14  Yes Historical Provider, MD  aspirin EC 81 MG tablet Take 81 mg by mouth daily.   Yes Historical Provider, MD  HUMULIN 70/30 KWIKPEN (70-30) 100 UNIT/ML PEN Inject 5-20 Units into the skin 2 (two) times daily as needed (CBG over 175).  11/27/14  Yes Historical Provider, MD  HYDROcodone-acetaminophen (NORCO) 10-325 MG per tablet Take 1 tablet by mouth every 6 (six) hours as needed. Patient taking differently: Take 1 tablet by mouth every 6 (six) hours as needed (pain).  12/31/14  Yes Richard Blenda Mounts, DPM  insulin glargine (LANTUS) 100 unit/mL SOPN Inject 25 Units into the skin at bedtime.  Yes Historical Provider, MD  metoCLOPramide (REGLAN) 5 MG tablet Take 5 mg by mouth 4 (four) times daily -  before meals and at bedtime.   Yes Historical Provider, MD  oxyCODONE (OXY IR/ROXICODONE) 5 MG immediate release tablet Take 1 tablet (5 mg total) by mouth every 8 (eight) hours as needed for severe pain. 01/04/15  Yes Conrad [place], MD  pregabalin (LYRICA) 75 MG capsule Take 75 mg by mouth 3 (three) times daily.   Yes Historical Provider, MD  ranitidine (ZANTAC) 150 MG tablet Take 150 mg by mouth daily.  01/24/14  Yes Historical Provider, MD    sevelamer (RENVELA) 800 MG tablet Take 3,200 mg by mouth 3 (three) times daily with meals.    Yes Historical Provider, MD  silver sulfADIAZINE (SILVADENE) 1 % cream Apply 1 application topically daily. Patient taking differently: Apply 1 application topically daily as needed (wound care).  04/18/14  Yes Richard Blenda Mounts, DPM  simvastatin (ZOCOR) 40 MG tablet Take 40 mg by mouth daily.    Yes Historical Provider, MD  cephALEXin (KEFLEX) 500 MG capsule Take 1 capsule (500 mg total) by mouth 2 (two) times daily. Patient not taking: Reported on 01/04/2015 11/26/14   Harriet Masson, DPM  ciprofloxacin (CIPRO) 250 MG tablet  11/29/14   Historical Provider, MD  lisinopril (PRINIVIL,ZESTRIL) 20 MG tablet Take 20 mg by mouth daily.    Historical Provider, MD    Family History  Family History  Problem Relation Age of Onset  . Heart disease    . Alcohol abuse    . Diabetes Mother   . Diabetes Sister   . Diabetes Brother     Social History  History   Social History  . Marital Status: Married    Spouse Name: N/A  . Number of Children: 1  . Years of Education: N/A   Occupational History  . retired    Social History Main Topics  . Smoking status: Current Every Day Smoker -- 0.50 packs/day for 30 years    Types: Cigarettes  . Smokeless tobacco: Never Used  . Alcohol Use: No  . Drug Use: Yes    Special: Marijuana  . Sexual Activity: Not on file   Other Topics Concern  . Not on file   Social History Narrative     Review of Systems General:  No chills, fever, night sweats or weight changes.  Cardiovascular:  No chest pain, dyspnea on exertion, edema, orthopnea, palpitations, paroxysmal nocturnal dyspnea. Dermatological: No rash, lesions/masses Respiratory: No cough, dyspnea Urologic: No hematuria, dysuria Abdominal:   No nausea, vomiting, diarrhea, bright red blood per rectum, melena, or hematemesis Neurologic:  No visual changes, wkns, changes in mental status. All other systems  reviewed and are otherwise negative except as noted above.  Physical Exam  Blood pressure 243/84, pulse 87, temperature 97.9 F (36.6 C), temperature source Oral, resp. rate 18, height 5' 4.25" (1.632 m), weight 123 lb (55.792 kg), SpO2 100 %.  General: Pleasant, NAD Psych: Normal affect. Neuro: Alert and oriented X 3. Moves all extremities spontaneously. HEENT: Normal  Neck: Supple without bruits or JVD. Lungs:  Resp regular and unlabored, CTA. Heart: RRR 2/6 SM heard best at RUSB and apex. Abdomen: Soft, non-tender, non-distended, BS + x 4.  Extremities: No clubbing, cyanosis or edema. Non palpable distal pulses. Audible by bedside dopplers.   Labs  Troponin (Point of Care Test) No results for input(s): TROPIPOC in the last 72 hours. No results for input(s): CKTOTAL, CKMB, TROPONINI  in the last 72 hours. Lab Results  Component Value Date   WBC 8.3 03/11/2011   HGB 11.9* 01/07/2015   HCT 35.0* 01/07/2015   MCV 101.4* 03/11/2011   PLT 147* 03/11/2011    Recent Labs Lab 01/07/15 1142  NA 136  K 3.6  CL 95*  BUN 31*  CREATININE 7.50*  GLUCOSE 75   Lab Results  Component Value Date   CHOL  03/11/2011    90        ATP III CLASSIFICATION:  <200     mg/dL   Desirable  200-239  mg/dL   Borderline High  >=240    mg/dL   High          HDL 37* 03/11/2011   LDLCALC  03/11/2011    40        Total Cholesterol/HDL:CHD Risk Coronary Heart Disease Risk Table                     Men   Women  1/2 Average Risk   3.4   3.3  Average Risk       5.0   4.4  2 X Average Risk   9.6   7.1  3 X Average Risk  23.4   11.0        Use the calculated Patient Ratio above and the CHD Risk Table to determine the patient's CHD Risk.        ATP III CLASSIFICATION (LDL):  <100     mg/dL   Optimal  100-129  mg/dL   Near or Above                    Optimal  130-159  mg/dL   Borderline  160-189  mg/dL   High  >190     mg/dL   Very High   TRIG 63 03/11/2011   No results found for:  DDIMER   Radiology/Studies  No results found.  ECG  NSR without any objective signs of ischemia.  ASSESSMENT AND PLAN  1. PVD: severe. Per Dr. Bridgett Larsson, will need attempt at right femoral to posterior tibial artery bypass, as well as possible right first and second toe amputations vs BKA. Pt has discontinue tobacco use.   2. HTN: systolic pressure elevated >200. This is in the setting of severe leg pain. IV labetalol and morphine ordered by Dr. Bridgett Larsson.   3. HLD: continue statin therapy with Zocor.   4. Tobacco Abuse: patient reports he quit 1 month ago. He was congratulated on his efforts and was encouraged to continue to refrain from further use.   5. Pre-operative Clearance: history includes LHC in 2012 which revealed mild luminal irregularities without any evidence of obstructive coronary artery disease. LVF also normal at that time. He denies any chest pain or dyspnea. EKG w/o evidence for ischemia. Physical exam notable for systolic murmur. No valvular disease noted in PMH. Recommend 2D echo to evaluate valvular anatomy to ensure no significant AS. This will also allow for assessment of LV systolic function and wall motion. If 2D echo is normal, would not recommend pre-operative ischemic w/u given lack of chest pain, normal EKG and normal [place] in 2012.   Cardiac Risk Assessment High Risk Surgery? No CAD?    No  CHF?    No Cerebrovascular Disease? No  DM on Insulin? Yes SCr > 2.0? Yes  Based on the Revised Cardiac Risk Index:  Estimated Risk of Adverse Outcome with Non Cardiac  Surgery is Moderate Risk   Estimated Rate of MI, PE, Ventricular fib, cardiac arrest or CHB is 6.6%    Signed, SIMMONS, BRITTAINY, PA-C 01/07/2015, 4:22 PM  I have seen and evaluated the patient this evening along with Ellen Henri, PA-C. We discussed the patient's chart, findings and history. He does have an aortic murmur on exam that is probably related to aortic sclerosis, but I do agree an  echocardiogram is reasonable to ensure that there is no stenosis.   We have reviewed his preoperative risk assessment based on the Revised Cardiac Risk Index. Simply because of his diabetes on insulin and ESRD, he would be considered to be a moderate risk patient for low risk surgery.  From an ACC/AHA Guidelines for Preoperative "Clearance" - he is undergoing a low risk surgery that is relatively urgent.  He is relatively active at baseline able to achieve at least greater than 4-6 METS. He also does not have any active heart failure or angina symptoms. In this case noninvasive ischemic evaluation would not change his management otherwise, therefore would not recommend further evaluation beyond echocardiographic evaluation of his murmur.  Preoperative risk data would suggest that patients do better without revascularization prior to surgery then following PCI/CABG.  Additionally, he had relatively normal coronary arteries on catheterization a few years ago.  He is profoundly hypertensive. Therefore I will start him on a beta blocker. While the benefit of beta-blockade preoperatively is more consistent when initiated enough in advance preoperatively to have an effect, if we are going to control his blood pressure a beta blocker would probably likely be the best option to decrease preoperative stress response.  Recommendation: Would proceed with planned lower extremity revascularization and amputation procedure without further cardiac evaluation beyond echocardiogram.  We will continue to monitor and assist as needed.  Leonie Man, M.D., M.S. Interventional Cardiologist   Pager # [phone]

## 2015-01-08 DIAGNOSIS — R011 Cardiac murmur, unspecified: Secondary | ICD-10-CM

## 2015-01-08 LAB — RENAL FUNCTION PANEL
Albumin: 3.4 g/dL — ABNORMAL LOW (ref 3.5–5.2)
Anion gap: 15 (ref 5–15)
BUN: 39 mg/dL — AB (ref 6–23)
CHLORIDE: 94 mmol/L — AB (ref 96–112)
CO2: 26 mmol/L (ref 19–32)
CREATININE: 10 mg/dL — AB (ref 0.50–1.35)
Calcium: 7.6 mg/dL — ABNORMAL LOW (ref 8.4–10.5)
GFR calc Af Amer: 6 mL/min — ABNORMAL LOW (ref >90)
GFR calc non Af Amer: 5 mL/min — ABNORMAL LOW (ref >90)
Glucose, Bld: 243 mg/dL — ABNORMAL HIGH (ref 70–99)
Phosphorus: 9.3 mg/dL — ABNORMAL HIGH (ref 2.3–4.6)
Potassium: 5.5 mmol/L — ABNORMAL HIGH (ref 3.5–5.1)
SODIUM: 135 mmol/L (ref 135–145)

## 2015-01-08 LAB — CBC
HCT: 32.9 % — ABNORMAL LOW (ref 39.0–52.0)
Hemoglobin: 10.9 g/dL — ABNORMAL LOW (ref 13.0–17.0)
MCH: 32.6 pg (ref 26.0–34.0)
MCHC: 33.1 g/dL (ref 30.0–36.0)
MCV: 98.5 fL (ref 78.0–100.0)
PLATELETS: 105 10*3/uL — AB (ref 150–400)
RBC: 3.34 MIL/uL — AB (ref 4.22–5.81)
RDW: 14.8 % (ref 11.5–15.5)
WBC: 5.1 10*3/uL (ref 4.0–10.5)

## 2015-01-08 LAB — GLUCOSE, CAPILLARY
GLUCOSE-CAPILLARY: 42 mg/dL — AB (ref 70–99)
GLUCOSE-CAPILLARY: 44 mg/dL — AB (ref 70–99)
GLUCOSE-CAPILLARY: 112 mg/dL — AB (ref 70–99)
Glucose-Capillary: 226 mg/dL — ABNORMAL HIGH (ref 70–99)
Glucose-Capillary: 76 mg/dL (ref 70–99)
Glucose-Capillary: 132 mg/dL — ABNORMAL HIGH (ref 70–99)
Glucose-Capillary: 129 mg/dL — ABNORMAL HIGH (ref 70–99)
Glucose-Capillary: 144 mg/dL — ABNORMAL HIGH (ref 70–99)

## 2015-01-08 MED ORDER — NEPRO/CARBSTEADY PO LIQD
237.0000 mL | ORAL | Status: DC | PRN
Start: 2015-01-08 — End: 2015-01-08
  Filled 2015-01-08: qty 237

## 2015-01-08 MED ORDER — PENTAFLUOROPROP-TETRAFLUOROETH EX AERO: 1.0000 "application " | INHALATION_SPRAY | CUTANEOUS | Status: DC | PRN

## 2015-01-08 MED ORDER — CALCITRIOL 0.5 MCG PO CAPS
0.5000 ug | ORAL_CAPSULE | Freq: Every day | ORAL | Status: DC
  Administered 2015-01-08 – 2015-01-14 (×5): 0.5 ug via ORAL
  Filled 2015-01-08 (×8): qty 1

## 2015-01-08 MED ORDER — DEXTROSE 50 % IV SOLN
INTRAVENOUS | Status: AC
  Administered 2015-01-08: 50 mL
  Filled 2015-01-08: qty 50

## 2015-01-08 MED ORDER — LIDOCAINE-PRILOCAINE 2.5-2.5 % EX CREA
1.0000 "application " | TOPICAL_CREAM | CUTANEOUS | Status: DC | PRN
  Filled 2015-01-08: qty 5

## 2015-01-08 MED ORDER — ALTEPLASE 2 MG IJ SOLR
2.0000 mg | Freq: Once | INTRAMUSCULAR | Status: DC | PRN
  Filled 2015-01-08: qty 2

## 2015-01-08 MED ORDER — CEFAZOLIN SODIUM-DEXTROSE 2-3 GM-% IV SOLR
2.0000 g | INTRAVENOUS | Status: AC
  Administered 2015-01-09: 2 g via INTRAVENOUS
  Filled 2015-01-08: qty 50

## 2015-01-08 MED ORDER — SODIUM CHLORIDE 0.9 % IV SOLN: 100.0000 mL | INTRAVENOUS | Status: DC | PRN

## 2015-01-08 MED ORDER — SODIUM CHLORIDE 0.9 % IV SOLN
62.5000 mg | INTRAVENOUS | Status: DC
  Administered 2015-01-10: 62.5 mg via INTRAVENOUS
  Filled 2015-01-08: qty 5

## 2015-01-08 MED ORDER — LIDOCAINE HCL (PF) 1 % IJ SOLN: 5.0000 mL | INTRAMUSCULAR | Status: DC | PRN

## 2015-01-08 MED ORDER — HEPARIN SODIUM (PORCINE) 1000 UNIT/ML DIALYSIS
1000.0000 [IU] | INTRAMUSCULAR | Status: DC | PRN
  Filled 2015-01-08: qty 1

## 2015-01-08 MED ORDER — MORPHINE SULFATE 4 MG/ML IJ SOLN
INTRAMUSCULAR | Status: AC
  Filled 2015-01-08: qty 1

## 2015-01-08 NOTE — Consult Note (Signed)
[facility] Renal Consultation Note  Indication for Consultation:  Management of ESRD/hemodialysis; anemia, hypertension/volume and secondary hyperparathyroidism  HPI: Garrett Salas is a 64 y.o. male with a history of Diabetes Type 2, blindness for four years secondary to diabetic retinopathy, neuropathy, and ESRD on dialysis at the [facility] who was seen by Dr. Bridgett Larsson of [facility] 2/19 for bilateral foot pain and right critical limb ischemia with gangrene and was admitted yesterday for aortogram and bilateral leg runoff.  Results indicate that the patient will require right femoral-to-posterior tibial artery bypass and possibly right first and second toe amputations vs below knee amputation.  Echocardiogram is pending per Cardiology for clearance for likely surgery tomorrow.  The patient complains of bilateral foot pain, especially on the right.  Dialysis Orders:  TTS @ [facility] 3:30      56 kg     2K/2.25Ca      400/A1.5       No heparin     AVF @ [facility]   Calcitriol 0.5 mcg         Mircera 50 mcg q2wk (last on 2/9)       Venofer 50 mg on Thurs  Past Medical History  Diagnosis Date  . Diabetes mellitus     Type 2. Diagnosed in mid 40s. Currently insulin requiring  . Hyperlipidemia   . ESRD (end stage renal disease) on dialysis     due to diabetic nephropathy. T/Th/Sat  . Tobacco abuse   . Diabetic neuropathy   . Diabetic retinopathy     legally blind  . Hypertension   . Dialysis patient    Past Surgical History  Procedure Laterality Date  . None    . Cardiac catheterization  05/06/2011    no significant CAD, EF 55-60%, LVEDP :15   . Av fistula placement    . Abdominal aortagram N/A 01/07/2015    Procedure: ABDOMINAL Maxcine Ham;  Surgeon: Conrad Pollock, MD;  Location: [facility] CATH LAB;  Service: Cardiovascular;  Laterality: N/A;   Family History  Problem Relation Age of Onset  . Heart disease    . Alcohol abuse    . Diabetes Mother   . Diabetes Sister   . Diabetes Brother     Social History  He smoked cigarettes until one month ago and occasionally used marijuana, but denies any alcohol or other drug use.  He lives in [place] with his wife and son.  No Known Allergies Prior to Admission medications   Medication Sig Start Date End Date Taking? Authorizing Provider  albuterol (PROVENTIL HFA;VENTOLIN HFA) 108 (90 BASE) MCG/ACT inhaler Inhale 2 puffs into the lungs every 6 (six) hours as needed for wheezing or shortness of breath.   Yes Historical Provider, MD  amphetamine-dextroamphetamine (ADDERALL) 5 MG tablet Take 5 mg by mouth daily.  11/21/14  Yes Historical Provider, MD  aspirin EC 81 MG tablet Take 81 mg by mouth daily.   Yes Historical Provider, MD  HUMULIN 70/30 KWIKPEN (70-30) 100 UNIT/ML PEN Inject 5-20 Units into the skin 2 (two) times daily as needed (CBG over 175).  11/27/14  Yes Historical Provider, MD  HYDROcodone-acetaminophen (NORCO) 10-325 MG per tablet Take 1 tablet by mouth every 6 (six) hours as needed. Patient taking differently: Take 1 tablet by mouth every 6 (six) hours as needed (pain).  12/31/14  Yes Richard Blenda Mounts, DPM  insulin glargine (LANTUS) 100 unit/mL SOPN Inject 25 Units into the skin at bedtime.   Yes Historical Provider, MD  metoCLOPramide (REGLAN) 5 MG tablet Take 5 mg by mouth 4 (four) times daily -  before meals and at bedtime.   Yes Historical Provider, MD  oxyCODONE (OXY IR/ROXICODONE) 5 MG immediate release tablet Take 1 tablet (5 mg total) by mouth every 8 (eight) hours as needed for severe pain. 01/04/15  Yes Conrad Crockett, MD  pregabalin (LYRICA) 75 MG capsule Take 75 mg by mouth 3 (three) times daily.   Yes Historical Provider, MD  ranitidine (ZANTAC) 150 MG tablet Take 150 mg by mouth daily.  01/24/14  Yes Historical Provider, MD  sevelamer (RENVELA) 800 MG tablet Take 3,200 mg by mouth 3 (three) times daily with meals.    Yes Historical Provider, MD  silver sulfADIAZINE (SILVADENE) 1 % cream Apply 1 application topically  daily. Patient taking differently: Apply 1 application topically daily as needed (wound care).  04/18/14  Yes Richard Blenda Mounts, DPM  simvastatin (ZOCOR) 40 MG tablet Take 40 mg by mouth daily.    Yes Historical Provider, MD  cephALEXin (KEFLEX) 500 MG capsule Take 1 capsule (500 mg total) by mouth 2 (two) times daily. Patient not taking: Reported on 01/04/2015 11/26/14   Harriet Masson, DPM  ciprofloxacin (CIPRO) 250 MG tablet  11/29/14   Historical Provider, MD  lisinopril (PRINIVIL,ZESTRIL) 20 MG tablet Take 20 mg by mouth daily.    Historical Provider, MD   Labs:  Results for orders placed or performed during the hospital encounter of 01/07/15 (from the past 48 hour(s))  I-STAT, chem 8     Status: Abnormal   Collection Time: 01/07/15 11:42 AM  Result Value Ref Range   Sodium 136 135 - 145 mmol/L   Potassium 3.6 3.5 - 5.1 mmol/L   Chloride 95 (L) 96 - 112 mmol/L   BUN 31 (H) 6 - 23 mg/dL   Creatinine, Ser 7.50 (H) 0.50 - 1.35 mg/dL   Glucose, Bld 75 70 - 99 mg/dL   Calcium, Ion 0.83 (L) 1.13 - 1.30 mmol/L   TCO2 24 0 - 100 mmol/L   Hemoglobin 11.9 (L) 13.0 - 17.0 g/dL   HCT 35.0 (L) 39.0 - 52.0 %  Glucose, capillary     Status: None   Collection Time: 01/07/15  2:59 PM  Result Value Ref Range   Glucose-Capillary 73 70 - 99 mg/dL  CBC     Status: Abnormal   Collection Time: 01/07/15  7:15 PM  Result Value Ref Range   WBC 5.9 4.0 - 10.5 K/uL   RBC 3.25 (L) 4.22 - 5.81 MIL/uL   Hemoglobin 10.2 (L) 13.0 - 17.0 g/dL   HCT 31.3 (L) 39.0 - 52.0 %   MCV 96.3 78.0 - 100.0 fL   MCH 31.4 26.0 - 34.0 pg   MCHC 32.6 30.0 - 36.0 g/dL   RDW 14.4 11.5 - 15.5 %   Platelets 127 (L) 150 - 400 K/uL  Comprehensive metabolic panel     Status: Abnormal   Collection Time: 01/07/15  7:15 PM  Result Value Ref Range   Sodium 135 135 - 145 mmol/L   Potassium 3.7 3.5 - 5.1 mmol/L   Chloride 94 (L) 96 - 112 mmol/L   CO2 28 19 - 32 mmol/L   Glucose, Bld 146 (H) 70 - 99 mg/dL   BUN 33 (H) 6 - 23 mg/dL    Creatinine, Ser 8.73 (H) 0.50 - 1.35 mg/dL   Calcium 7.6 (L) 8.4 - 10.5 mg/dL   Total Protein 6.9 6.0 - 8.3 g/dL  Albumin 3.3 (L) 3.5 - 5.2 g/dL   AST 27 0 - 37 U/L   ALT 16 0 - 53 U/L   Alkaline Phosphatase 77 39 - 117 U/L   Total Bilirubin 0.7 0.3 - 1.2 mg/dL   GFR calc non Af Amer 6 (L) >90 mL/min   GFR calc Af Amer 7 (L) >90 mL/min    Comment: (NOTE) The eGFR has been calculated using the CKD EPI equation. This calculation has not been validated in all clinical situations. eGFR's persistently <90 mL/min signify possible Chronic Kidney Disease.    Anion gap 13 5 - 15  Protime-INR     Status: None   Collection Time: 01/07/15  7:15 PM  Result Value Ref Range   Prothrombin Time 14.1 11.6 - 15.2 seconds   INR 1.08 0.00 - 1.49  Glucose, capillary     Status: Abnormal   Collection Time: 01/07/15 10:11 PM  Result Value Ref Range   Glucose-Capillary 226 (H) 70 - 99 mg/dL  Glucose, capillary     Status: None   Collection Time: 01/08/15  6:28 AM  Result Value Ref Range   Glucose-Capillary 76 70 - 99 mg/dL   Constitutional: negative for chills, fatigue, fevers and sweats Eyes: legally blind Ears, nose, mouth, throat, and face: negative for earaches, hoarseness, nasal congestion and sore throat Respiratory: negative for cough, dyspnea on exertion, hemoptysis and sputum Cardiovascular: negative for chest pain, chest pressure/discomfort, dyspnea, orthopnea and palpitations Gastrointestinal: negative for abdominal pain, change in bowel habits, nausea and vomiting Genitourinary:negative, anuric Musculoskeletal: positive for bilateral foot pain (R < L), too painful to walk; negative for arthralgias, back pain, myalgias and neck pain Neurological: negative for dizziness, headaches, speech problems and weakness  Physical Exam: Filed Vitals:   01/08/15 0643  BP: 166/76  Pulse: 66  Temp: 98.1 F (36.7 C)  Resp: 18     General appearance: alert, cooperative and no distress Head:  Normocephalic, without obvious abnormality, atraumatic Neck: no adenopathy, no carotid bruit, no JVD and supple, symmetrical, trachea midline Resp: clear to auscultation bilaterally Cardio: regular rate and rhythm, S1, S2 normal, Gr II/VI systolic murmur, no rub GI: soft, non-tender; bowel sounds normal; no masses,  no organomegaly Extremities: right foot with dressing, very tender, no cyanosis or edema no fem bruits Neurologic: Grossly normal Dialysis Access: AVF @ [facility] with + bruit   Assessment/Plan: 1. Bilateral LE ischemia with R foot gangrene - aortogram 2/22 showed heavy calcification with no flow to R anterior toes; R femoral to posterior tibial artery bypass with R 1-2 toe amputations vs BKA tomorrow pending Cardiology clearance. 2. ESRD - HD on TTS @ [facility], K 3.7.  HD pending today. 3. Hypertension/volume - BP 166/78 on Carvedilol 6.25 mg bid, Lisinopril 20 mg qd; wt 59.2 kg with HD pending. 4. Anemia - Hgb 10.2, Mircera last given 2/9, Fe qwk. 5. Metabolic bone disease - Ca 7.6 (8.2 corrected), last P 6,iPTH 323; Calcitriol 0.5 mcg, Renvela with meals. 6. Nutrition - Alb 3.3, carb-mod diet. 7. 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   [name],[name] 01/08/2015, 9:59 AM   Attending Nephrologist: Roney Jaffe, MD  Pt seen, examined, agree w assess/plan as above with additions as indicated.  Kelly Splinter MD pager [phone]    cell [phone] 01/08/2015, 2:33 PM

## 2015-01-08 NOTE — Progress Notes (Signed)
  Echocardiogram 2D Echocardiogram has been performed.  Lysle Rubens 01/08/2015, 2:45 PM

## 2015-01-08 NOTE — Progress Notes (Signed)
   Daily Progress Note  Assessment/Planning: POD #1 s/p Ao, BRo   2D echo and vein mapping pending  If no vein conduit, will proceed with R BKA tomorrow  Tenatively will schedule for a R CFA to PT bypass, R 1-2 toe amputation, and possible R BKA  Will explore R popliteal fossa first, if PT is too calcified to complete the bypass, will proceed with R BKA.  Pt repeatedly fell asleep during our discussion, so I'm not certain he understands what I'm proposing  Subjective  - 1 Day Post-Op  Ok  Objective Filed Vitals:   01/07/15 2015 01/07/15 2048 01/07/15 2140 01/08/15 0643  BP: 196/84 173/74 135/67 166/76  Pulse:    66  Temp: 98 F (36.7 C)   98.1 F (36.7 C)  TempSrc: Oral   Oral  Resp: 18 18 18 18   Height:      Weight:    130 lb 9.6 oz (59.24 kg)  SpO2: 94% 95% 94% 99%   No intake or output data in the 24 hours ending 01/08/15 0949  PULM  CTAB CV  RRR GI  soft, NTND VASC  R groin soft, R foot bandaged  Laboratory CBC    Component Value Date/Time   WBC 5.9 01/07/2015 1915   HGB 10.2* 01/07/2015 1915   HCT 31.3* 01/07/2015 1915   PLT 127* 01/07/2015 1915    BMET    Component Value Date/Time   NA 135 01/07/2015 1915   K 3.7 01/07/2015 1915   CL 94* 01/07/2015 1915   CO2 28 01/07/2015 1915   GLUCOSE 146* 01/07/2015 1915   BUN 33* 01/07/2015 1915   CREATININE 8.73* 01/07/2015 1915   CALCIUM 7.6* 01/07/2015 1915   GFRNONAA 6* 01/07/2015 1915   GFRAA 7* 01/07/2015 1915    Adele Barthel, MD [facility] Office: [phone] Pager: [phone]  01/08/2015, 9:49 AM

## 2015-01-08 NOTE — Progress Notes (Signed)
Hypoglycemic Event  CBG: 42, repeat at 0959 44  Treatment: orange juice,apple sauce 50%dextrose 25 cc iv  Symptoms: sweating,drowsy  Follow-up CBG: Time:1029 CBG Result 132  Possible Reasons for Event: Poor po intake  Comments/MD notified Garrett Henri PA-C During rounding    Garrett Salas, Garrett Salas  Remember to initiate Hypoglycemia Order Set & complete

## 2015-01-08 NOTE — Progress Notes (Signed)
       Pending 2D echo today for cardiac clearance.    Plan this patient needs: attempt at right femoral to posterior tibial artery bypass, right first and second toe amputation vs below-knee amputation. This patient's tibial arteries are calcified, so a fem-tib may not be possible.  Marton Malizia MAUREEN PA-C

## 2015-01-08 NOTE — Procedures (Signed)
I was present at this dialysis session, have reviewed the session itself and made  appropriate changes  Kelly Splinter MD (pgr) [phone]    ([phone] 01/08/2015, 4:36 PM

## 2015-01-08 NOTE — Progress Notes (Signed)
Patient Profile 64 y/o male with history of HTN, HLD, DM, ESRD on HD, and h/o normal cath in 2012 with NL LFV, admitted for PV angiogram to evaluate for PVD in the setting of right critical limb ischemia w/ gangrene. Will require surgical revascularization and possible toe vs BKA.   Subjective: No complaints.  Objective: Vital signs in last 24 hours: Temp:  [97.6 F (36.4 C)-98.1 F (36.7 C)] 98.1 F (36.7 C) (02/23 0643) Pulse Rate:  [65-89] 66 (02/23 0643) Resp:  [8-22] 18 (02/23 0643) BP: (135-245)/(67-99) 166/76 mmHg (02/23 0643) SpO2:  [94 %-100 %] 99 % (02/23 0643) Weight:  [123 lb (55.792 kg)-130 lb 9.6 oz (59.24 kg)] 130 lb 9.6 oz (59.24 kg) (02/23 0643) Last BM Date: 01/07/15  Intake/Output from previous day:   Intake/Output this shift:    Medications Current Facility-Administered Medications  Medication Dose Route Frequency Provider Last Rate Last Dose  . 0.9 %  sodium chloride infusion  250 mL Intravenous PRN Ulyses Amor, PA-C      . 0.9 %  sodium chloride infusion  250 mL Intravenous PRN Conrad [place], MD      . acetaminophen (TYLENOL) tablet 325-650 mg  325-650 mg Oral Q4H PRN Ulyses Amor, PA-C       Or  . acetaminophen (TYLENOL) suppository 325-650 mg  325-650 mg Rectal Q4H PRN Ulyses Amor, PA-C      . amphetamine-dextroamphetamine (ADDERALL) tablet 5 mg  5 mg Oral Daily Emma M Collins, PA-C      . aspirin EC tablet 81 mg  81 mg Oral Daily Emma M Collins, PA-C      . carvedilol (COREG) tablet 6.25 mg  6.25 mg Oral BID WC Leonie Man, MD   6.25 mg at 01/07/15 2045  . famotidine (PEPCID) tablet 20 mg  20 mg Oral Daily Ulyses Amor, PA-C      . guaiFENesin-dextromethorphan (ROBITUSSIN DM) 100-10 MG/5ML syrup 15 mL  15 mL Oral Q4H PRN Ulyses Amor, PA-C      . heparin injection 5,000 Units  5,000 Units Subcutaneous 3 times per day Ulyses Amor, PA-C   5,000 Units at 01/07/15 2105  . hydrALAZINE (APRESOLINE) injection 5 mg  5 mg Intravenous Q20 Min  PRN Ulyses Amor, PA-C      . insulin aspart (novoLOG) injection 0-15 Units  0-15 Units Subcutaneous TID WC Conrad [place], MD   0 Units at 01/08/15 [phone]  . insulin glargine (LANTUS) injection 25 Units  25 Units Subcutaneous QHS Conrad , MD   25 Units at 01/07/15 2105  . lisinopril (PRINIVIL,ZESTRIL) tablet 20 mg  20 mg Oral Daily Ulyses Amor, PA-C      . metoCLOPramide (REGLAN) tablet 5 mg  5 mg Oral TID AC & HS Ulyses Amor, PA-C   5 mg at 01/08/15 [id number]  . metoprolol (LOPRESSOR) injection 2-5 mg  2-5 mg Intravenous Q2H PRN Ulyses Amor, PA-C      . morphine 2 MG/ML injection 2-5 mg  2-5 mg Intravenous Q1H PRN Ulyses Amor, PA-C      . ondansetron [facility]) injection 4 mg  4 mg Intravenous Q6H PRN Ulyses Amor, PA-C      . oxyCODONE-acetaminophen (PERCOCET/ROXICET) 5-325 MG per tablet 1-2 tablet  1-2 tablet Oral Q4H PRN Ulyses Amor, PA-C   2 tablet at 01/08/15 [id number]  . phenol (CHLORASEPTIC) mouth spray 1 spray  1 spray Mouth/Throat PRN Susette Racer  Collins, PA-C      . pregabalin (LYRICA) capsule 75 mg  75 mg Oral TID Ulyses Amor, PA-C   75 mg at 01/07/15 2104  . sevelamer carbonate (RENVELA) tablet 3,200 mg  3,200 mg Oral TID WC Ulyses Amor, PA-C   3,200 mg at 01/08/15 0818  . silver sulfADIAZINE (SILVADENE) 1 % cream 1 application  1 application Topical Daily PRN Conrad [place], MD      . simvastatin (ZOCOR) tablet 40 mg  40 mg Oral QHS Ulyses Amor, PA-C   40 mg at 01/07/15 2104  . sodium chloride 0.9 % injection 3 mL  3 mL Intravenous Q12H Ulyses Amor, PA-C   3 mL at 01/07/15 2106  . sodium chloride 0.9 % injection 3 mL  3 mL Intravenous PRN Ulyses Amor, PA-C      . sodium chloride 0.9 % injection 3 mL  3 mL Intravenous Q12H Conrad [place], MD   3 mL at 01/07/15 2106  . sodium chloride 0.9 % injection 3 mL  3 mL Intravenous PRN Conrad [place], MD        PE: General: Pleasant, NAD Psych: Normal affect. Neuro: Alert and oriented X 3. Moves all extremities  spontaneously. HEENT: Normal Neck: Supple without bruits or JVD. Lungs: Resp regular and unlabored, CTA. Heart: RRR 2/6 SM heard best at RUSB and apex. Abdomen: Soft, non-tender, non-distended, BS + x 4.  Extremities: No clubbing, cyanosis or edema. Non palpable distal pulses. Audible by bedside dopplers.   Lab Results:   Recent Labs  01/07/15 1142 01/07/15 1915  WBC  --  5.9  HGB 11.9* 10.2*  HCT 35.0* 31.3*  PLT  --  127*   BMET  Recent Labs  01/07/15 1142 01/07/15 1915  NA 136 135  K 3.6 3.7  CL 95* 94*  CO2  --  28  GLUCOSE 75 146*  BUN 31* 33*  CREATININE 7.50* 8.73*  CALCIUM  --  7.6*   PT/INR  Recent Labs  01/07/15 1915  LABPROT 14.1  INR 1.08    Studies/Results: 2D echo pending   Assessment/Plan   Active Problems:   Atherosclerosis of native arteries of the extremities with gangrene   PAD (peripheral artery disease)  ASSESSMENT AND PLAN  1. PVD: severe. Per Dr. Bridgett Larsson, will need attempt at right femoral to posterior tibial artery bypass, as well as possible right first and second toe amputations vs BKA. Pt has discontinue tobacco use.   2. HTN: Improved but still elevated. Systolic BP is down from > 200 to 160s. Continue Coreg and lisinopril.   3. HLD: continue statin therapy with Zocor.   4. Tobacco Abuse: patient reports he quit 1 month ago. He was congratulated on his efforts and was encouraged to continue to refrain from further use.   5. Pre-operative Clearance: history includes LHC in 2012 which revealed mild luminal irregularities without any evidence of obstructive coronary artery disease. LVF also normal at that time. He denies any chest pain or dyspnea. EKG w/o evidence for ischemia. Physical exam notable for systolic murmur. No valvular disease noted in PMH. Recommend 2D echo to evaluate valvular anatomy to ensure no significant AS. This will also allow for assessment of LV systolic function and wall motion. If 2D echo is  normal, would not recommend pre-operative ischemic w/u given lack of chest pain, normal EKG and normal [place] in 2012.   Cardiac Risk Assessment High Risk Surgery? No CAD? No  CHF? No Cerebrovascular Disease? No  DM on Insulin? Yes SCr > 2.0? Yes  Based on the Revised Cardiac Risk Index:  Estimated Risk of Adverse Outcome with Non Cardiac Surgery is Moderate Risk   Estimated Rate of MI, PE, Ventricular fib, cardiac arrest or CHB is 6.6%  Awaiting results of 2D echo. If normal, no further w/u needed. Can proceed with surgery.    LOS: 1 day    Brittainy M. Ladoris Gene 01/08/2015 9:33 AM  I have personally seen and examined this patient with Lyda Jester, PA-C. I agree with the assessment and plan as outlined above. Awaiting echo today. If LV function is ok and no severe valvular disease, will be ok to proceed with surgery as planned. We will follow along with you.   Cathye Kreiter 01/08/2015 10:38 AM

## 2015-01-09 ENCOUNTER — Encounter (HOSPITAL_COMMUNITY)
Admission: RE | Disposition: A | Discharge status: Home Health | Payer: Self-pay | Source: Ambulatory Visit | Attending: Vascular Surgery

## 2015-01-09 ENCOUNTER — Encounter (HOSPITAL_COMMUNITY): Payer: Self-pay | Admitting: *Deleted

## 2015-01-09 ENCOUNTER — Encounter: Payer: Medicare Other | Admitting: Cardiology

## 2015-01-09 ENCOUNTER — Inpatient Hospital Stay (HOSPITAL_COMMUNITY): Payer: Medicare Other | Admitting: Anesthesiology

## 2015-01-09 DIAGNOSIS — I70245 Atherosclerosis of native arteries of left leg with ulceration of other part of foot: Secondary | ICD-10-CM

## 2015-01-09 HISTORY — PX: AMPUTATION: SHX166

## 2015-01-09 LAB — HEMOGLOBIN A1C
HEMOGLOBIN A1C: 6 % — AB (ref 4.8–5.6)
MEAN PLASMA GLUCOSE: 126 mg/dL

## 2015-01-09 LAB — CBC
HCT: 30.8 % — ABNORMAL LOW (ref 39.0–52.0)
HCT: 25.3 % — ABNORMAL LOW (ref 39.0–52.0)
HEMOGLOBIN: 8.2 g/dL — AB (ref 13.0–17.0)
Hemoglobin: 10 g/dL — ABNORMAL LOW (ref 13.0–17.0)
MCH: 32.1 pg (ref 26.0–34.0)
MCH: 32.5 pg (ref 26.0–34.0)
MCHC: 32.5 g/dL (ref 30.0–36.0)
MCHC: 32.4 g/dL (ref 30.0–36.0)
MCV: 98.7 fL (ref 78.0–100.0)
MCV: 100.4 fL — ABNORMAL HIGH (ref 78.0–100.0)
PLATELETS: 103 10*3/uL — AB (ref 150–400)
Platelets: 111 10*3/uL — ABNORMAL LOW (ref 150–400)
RBC: 3.12 MIL/uL — AB (ref 4.22–5.81)
RBC: 2.52 MIL/uL — ABNORMAL LOW (ref 4.22–5.81)
RDW: 15.1 % (ref 11.5–15.5)
RDW: 14.8 % (ref 11.5–15.5)
WBC: 5.1 10*3/uL (ref 4.0–10.5)
WBC: 8.5 10*3/uL (ref 4.0–10.5)

## 2015-01-09 LAB — TYPE AND SCREEN
ABO/RH(D): O POS
Antibody Screen: NEGATIVE

## 2015-01-09 LAB — GLUCOSE, CAPILLARY
GLUCOSE-CAPILLARY: 158 mg/dL — AB (ref 70–99)
GLUCOSE-CAPILLARY: 56 mg/dL — AB (ref 70–99)
GLUCOSE-CAPILLARY: 60 mg/dL — AB (ref 70–99)
GLUCOSE-CAPILLARY: 99 mg/dL (ref 70–99)
Glucose-Capillary: 38 mg/dL — CL (ref 70–99)
Glucose-Capillary: 158 mg/dL — ABNORMAL HIGH (ref 70–99)
Glucose-Capillary: 73 mg/dL (ref 70–99)
Glucose-Capillary: 70 mg/dL (ref 70–99)
Glucose-Capillary: 80 mg/dL (ref 70–99)

## 2015-01-09 LAB — BASIC METABOLIC PANEL
Anion gap: 8 (ref 5–15)
BUN: 19 mg/dL (ref 6–23)
CALCIUM: 7.8 mg/dL — AB (ref 8.4–10.5)
CHLORIDE: 101 mmol/L (ref 96–112)
CO2: 30 mmol/L (ref 19–32)
CREATININE: 6.04 mg/dL — AB (ref 0.50–1.35)
GFR calc Af Amer: 10 mL/min — ABNORMAL LOW (ref >90)
GFR calc non Af Amer: 9 mL/min — ABNORMAL LOW (ref >90)
GLUCOSE: 52 mg/dL — AB (ref 70–99)
Potassium: 4.6 mmol/L (ref 3.5–5.1)
SODIUM: 139 mmol/L (ref 135–145)

## 2015-01-09 LAB — CREATININE, SERUM
CREATININE: 6.63 mg/dL — AB (ref 0.50–1.35)
GFR calc Af Amer: 9 mL/min — ABNORMAL LOW (ref >90)
GFR, EST NON AFRICAN AMERICAN: 8 mL/min — AB (ref >90)

## 2015-01-09 LAB — SURGICAL PCR SCREEN
MRSA, PCR: NEGATIVE
Staphylococcus aureus: NEGATIVE

## 2015-01-09 LAB — ABO/RH: ABO/RH(D): O POS

## 2015-01-09 SURGERY — AMPUTATION BELOW KNEE
Anesthesia: General | Site: Leg Lower | Laterality: Right

## 2015-01-09 MED ORDER — LIDOCAINE HCL (CARDIAC) 20 MG/ML IV SOLN
INTRAVENOUS | Status: DC | PRN
  Administered 2015-01-09: 100 mg via INTRAVENOUS

## 2015-01-09 MED ORDER — SODIUM CHLORIDE 0.9 % IV SOLN: 100.0000 mL | INTRAVENOUS | Status: DC | PRN

## 2015-01-09 MED ORDER — LIDOCAINE-PRILOCAINE 2.5-2.5 % EX CREA: 1.0000 "application " | TOPICAL_CREAM | CUTANEOUS | Status: DC | PRN

## 2015-01-09 MED ORDER — DEXTROSE 5 % IV SOLN
1.5000 g | Freq: Once | INTRAVENOUS | Status: DC
  Filled 2015-01-09: qty 1.5

## 2015-01-09 MED ORDER — PANTOPRAZOLE SODIUM 40 MG PO TBEC
40.0000 mg | DELAYED_RELEASE_TABLET | Freq: Every day | ORAL | Status: DC
  Administered 2015-01-09 – 2015-01-11 (×2): 40 mg via ORAL
  Filled 2015-01-09 (×2): qty 1

## 2015-01-09 MED ORDER — POTASSIUM CHLORIDE CRYS ER 20 MEQ PO TBCR: 20.0000 meq | EXTENDED_RELEASE_TABLET | Freq: Every day | ORAL | Status: DC | PRN

## 2015-01-09 MED ORDER — ALTEPLASE 2 MG IJ SOLR
2.0000 mg | Freq: Once | INTRAMUSCULAR | Status: AC | PRN
  Filled 2015-01-09: qty 2

## 2015-01-09 MED ORDER — FENTANYL CITRATE 0.05 MG/ML IJ SOLN
INTRAMUSCULAR | Status: AC
  Filled 2015-01-09: qty 5

## 2015-01-09 MED ORDER — HEPARIN SODIUM (PORCINE) 1000 UNIT/ML DIALYSIS: 1000.0000 [IU] | INTRAMUSCULAR | Status: DC | PRN

## 2015-01-09 MED ORDER — SENNOSIDES-DOCUSATE SODIUM 8.6-50 MG PO TABS
1.0000 | ORAL_TABLET | Freq: Every evening | ORAL | Status: DC | PRN
  Filled 2015-01-09: qty 1

## 2015-01-09 MED ORDER — PENTAFLUOROPROP-TETRAFLUOROETH EX AERO: 1.0000 "application " | INHALATION_SPRAY | CUTANEOUS | Status: DC | PRN

## 2015-01-09 MED ORDER — MIDAZOLAM HCL 5 MG/5ML IJ SOLN
INTRAMUSCULAR | Status: DC | PRN
  Administered 2015-01-09: 2 mg via INTRAVENOUS

## 2015-01-09 MED ORDER — MIDAZOLAM HCL 2 MG/2ML IJ SOLN
INTRAMUSCULAR | Status: AC
  Filled 2015-01-09: qty 2

## 2015-01-09 MED ORDER — LIDOCAINE HCL (PF) 1 % IJ SOLN: 5.0000 mL | INTRAMUSCULAR | Status: DC | PRN

## 2015-01-09 MED ORDER — DOCUSATE SODIUM 100 MG PO CAPS
100.0000 mg | ORAL_CAPSULE | Freq: Every day | ORAL | Status: DC
  Administered 2015-01-11 – 2015-01-14 (×4): 100 mg via ORAL
  Filled 2015-01-09 (×6): qty 1

## 2015-01-09 MED ORDER — NEPRO/CARBSTEADY PO LIQD
237.0000 mL | ORAL | Status: DC | PRN
  Filled 2015-01-09: qty 237

## 2015-01-09 MED ORDER — 0.9 % SODIUM CHLORIDE (POUR BTL) OPTIME
TOPICAL | Status: DC | PRN
  Administered 2015-01-09: 1000 mL

## 2015-01-09 MED ORDER — HYDROMORPHONE HCL 1 MG/ML IJ SOLN
0.5000 mg | INTRAMUSCULAR | Status: DC | PRN
  Administered 2015-01-09: 0.25 mg via INTRAVENOUS

## 2015-01-09 MED ORDER — HYDROMORPHONE HCL 1 MG/ML IJ SOLN
0.5000 mg | INTRAMUSCULAR | Status: DC | PRN
  Administered 2015-01-10 (×2): 1 mg via INTRAVENOUS
  Filled 2015-01-09: qty 1

## 2015-01-09 MED ORDER — DEXTROSE 50 % IV SOLN
25.0000 mL | Freq: Once | INTRAVENOUS | Status: DC
  Filled 2015-01-09: qty 50

## 2015-01-09 MED ORDER — HYDROMORPHONE HCL 1 MG/ML IJ SOLN
INTRAMUSCULAR | Status: AC
  Filled 2015-01-09: qty 1

## 2015-01-09 MED ORDER — DEXTROSE-NACL 5-0.45 % IV SOLN
INTRAVENOUS | Status: DC
  Administered 2015-01-09 – 2015-01-10 (×3): via INTRAVENOUS

## 2015-01-09 MED ORDER — DEXTROSE 50 % IV SOLN
INTRAVENOUS | Status: AC
  Administered 2015-01-09: 25 mL
  Filled 2015-01-09: qty 50

## 2015-01-09 MED ORDER — PROPOFOL 10 MG/ML IV BOLUS
INTRAVENOUS | Status: DC | PRN
  Administered 2015-01-09: 140 mg via INTRAVENOUS

## 2015-01-09 MED ORDER — MAGNESIUM SULFATE 2 GM/50ML IV SOLN
2.0000 g | Freq: Every day | INTRAVENOUS | Status: DC | PRN
  Filled 2015-01-09: qty 50

## 2015-01-09 MED ORDER — ALUM & MAG HYDROXIDE-SIMETH 200-200-20 MG/5ML PO SUSP
15.0000 mL | ORAL | Status: DC | PRN
  Filled 2015-01-09: qty 30

## 2015-01-09 MED ORDER — PROPOFOL 10 MG/ML IV BOLUS
INTRAVENOUS | Status: AC
  Filled 2015-01-09: qty 20

## 2015-01-09 MED ORDER — FENTANYL CITRATE 0.05 MG/ML IJ SOLN
INTRAMUSCULAR | Status: DC | PRN
  Administered 2015-01-09: 100 ug via INTRAVENOUS

## 2015-01-09 MED ORDER — BISACODYL 10 MG RE SUPP: 10.0000 mg | Freq: Every day | RECTAL | Status: DC | PRN

## 2015-01-09 MED ORDER — SODIUM CHLORIDE 0.9 % IV SOLN
INTRAVENOUS | Status: DC | PRN
  Administered 2015-01-09: 11:00:00 via INTRAVENOUS

## 2015-01-09 SURGICAL SUPPLY — 78 items
BANDAGE ELASTIC 4 VELCRO ST LF (GAUZE/BANDAGES/DRESSINGS) ×3 IMPLANT
BANDAGE ELASTIC 6 VELCRO ST LF (GAUZE/BANDAGES/DRESSINGS) ×3 IMPLANT
BANDAGE ESMARK 6X9 LF (GAUZE/BANDAGES/DRESSINGS) ×2 IMPLANT
BANDAGE GAUZE 4  KLING STR (GAUZE/BANDAGES/DRESSINGS) ×3 IMPLANT
BLADE SAW SAG 73X25 THK (BLADE) ×1
BLADE SAW SGTL 73X25 THK (BLADE) ×2 IMPLANT
BNDG COHESIVE 6X5 TAN STRL LF (GAUZE/BANDAGES/DRESSINGS) IMPLANT
BNDG ESMARK 6X9 LF (GAUZE/BANDAGES/DRESSINGS) ×3
BNDG GAUZE ELAST 4 BULKY (GAUZE/BANDAGES/DRESSINGS) ×3 IMPLANT
CANISTER SUCTION 2500CC (MISCELLANEOUS) ×3 IMPLANT
CLIP TI MEDIUM 24 (CLIP) IMPLANT
CLIP TI MEDIUM 6 (CLIP) IMPLANT
CLIP TI WIDE RED SMALL 24 (CLIP) IMPLANT
COVER PROBE W GEL 5X96 (DRAPES) IMPLANT
COVER SURGICAL LIGHT HANDLE (MISCELLANEOUS) ×3 IMPLANT
COVER TABLE BACK 60X90 (DRAPES) IMPLANT
CUFF TOURNIQUET SINGLE 18IN (TOURNIQUET CUFF) IMPLANT
CUFF TOURNIQUET SINGLE 24IN (TOURNIQUET CUFF) ×3 IMPLANT
CUFF TOURNIQUET SINGLE 34IN LL (TOURNIQUET CUFF) IMPLANT
CUFF TOURNIQUET SINGLE 44IN (TOURNIQUET CUFF) IMPLANT
DRAIN CHANNEL 15F RND FF W/TCR (WOUND CARE) IMPLANT
DRAIN CHANNEL 19F RND (DRAIN) IMPLANT
DRAPE C-ARM 42X72 X-RAY (DRAPES) IMPLANT
DRAPE ORTHO SPLIT 77X108 STRL (DRAPES) ×2
DRAPE PROXIMA HALF (DRAPES) ×3 IMPLANT
DRAPE SURG ORHT 6 SPLT 77X108 (DRAPES) ×4 IMPLANT
DRSG ADAPTIC 3X8 NADH LF (GAUZE/BANDAGES/DRESSINGS) ×3 IMPLANT
DRSG COVADERM 4X8 (GAUZE/BANDAGES/DRESSINGS) IMPLANT
ELECT REM PT RETURN 9FT ADLT (ELECTROSURGICAL) ×3
ELECTRODE REM PT RTRN 9FT ADLT (ELECTROSURGICAL) ×2 IMPLANT
EVACUATOR SILICONE 100CC (DRAIN) IMPLANT
GAUZE SPONGE 4X4 12PLY STRL (GAUZE/BANDAGES/DRESSINGS) ×3 IMPLANT
GAUZE SPONGE 4X4 16PLY XRAY LF (GAUZE/BANDAGES/DRESSINGS) IMPLANT
GLOVE BIO SURGEON STRL SZ7 (GLOVE) ×3 IMPLANT
GLOVE BIOGEL PI IND STRL 7.5 (GLOVE) ×2 IMPLANT
GLOVE BIOGEL PI INDICATOR 7.5 (GLOVE) ×1
GLOVE ECLIPSE 7.0 STRL STRAW (GLOVE) ×3 IMPLANT
GLOVE SURG SS PI 7.0 STRL IVOR (GLOVE) ×3 IMPLANT
GOWN STRL REUS W/ TWL LRG LVL3 (GOWN DISPOSABLE) ×6 IMPLANT
GOWN STRL REUS W/TWL LRG LVL3 (GOWN DISPOSABLE) ×3
KIT BASIN OR (CUSTOM PROCEDURE TRAY) ×3 IMPLANT
KIT ROOM TURNOVER OR (KITS) ×3 IMPLANT
LIQUID BAND (GAUZE/BANDAGES/DRESSINGS) IMPLANT
MARKER GRAFT CORONARY BYPASS (MISCELLANEOUS) IMPLANT
NS IRRIG 1000ML POUR BTL (IV SOLUTION) ×3 IMPLANT
PACK GENERAL/GYN (CUSTOM PROCEDURE TRAY) ×3 IMPLANT
PACK PERIPHERAL VASCULAR (CUSTOM PROCEDURE TRAY) IMPLANT
PAD ARMBOARD 7.5X6 YLW CONV (MISCELLANEOUS) ×6 IMPLANT
PADDING CAST COTTON 6X4 STRL (CAST SUPPLIES) IMPLANT
PROBE PENCIL 8 MHZ STRL DISP (MISCELLANEOUS) IMPLANT
SPONGE GAUZE 4X4 12PLY STER LF (GAUZE/BANDAGES/DRESSINGS) ×3 IMPLANT
SPONGE SURGIFOAM ABS GEL 100 (HEMOSTASIS) IMPLANT
STAPLER VISISTAT 35W (STAPLE) ×3 IMPLANT
STOCKINETTE IMPERVIOUS LG (DRAPES) ×3 IMPLANT
STOPCOCK 4 WAY LG BORE MALE ST (IV SETS) IMPLANT
SUT ETHILON 3 0 PS 1 (SUTURE) IMPLANT
SUT MNCRL AB 4-0 PS2 18 (SUTURE) IMPLANT
SUT PROLENE 5 0 C 1 24 (SUTURE) IMPLANT
SUT PROLENE 6 0 BV (SUTURE) IMPLANT
SUT PROLENE 7 0 BV 1 (SUTURE) IMPLANT
SUT SILK 0 TIES 10X30 (SUTURE) IMPLANT
SUT SILK 2 0 (SUTURE) ×1
SUT SILK 2 0 FS (SUTURE) IMPLANT
SUT SILK 2 0 SH (SUTURE) ×3 IMPLANT
SUT SILK 2-0 18XBRD TIE 12 (SUTURE) ×2 IMPLANT
SUT SILK 3 0 (SUTURE)
SUT SILK 3-0 18XBRD TIE 12 (SUTURE) IMPLANT
SUT VIC AB 2-0 CT1 18 (SUTURE) ×12 IMPLANT
SUT VIC AB 2-0 CT1 27 (SUTURE)
SUT VIC AB 2-0 CT1 TAPERPNT 27 (SUTURE) IMPLANT
SUT VIC AB 3-0 SH 18 (SUTURE) ×3 IMPLANT
SUT VIC AB 3-0 SH 27 (SUTURE)
SUT VIC AB 3-0 SH 27X BRD (SUTURE) IMPLANT
TAPE UMBILICAL COTTON 1/8X30 (MISCELLANEOUS) ×3 IMPLANT
TRAY FOLEY CATH 16FRSI W/METER (SET/KITS/TRAYS/PACK) IMPLANT
TUBING EXTENTION W/L.L. (IV SETS) IMPLANT
UNDERPAD 30X30 INCONTINENT (UNDERPADS AND DIAPERS) ×3 IMPLANT
WATER STERILE IRR 1000ML POUR (IV SOLUTION) IMPLANT

## 2015-01-09 NOTE — Anesthesia Preprocedure Evaluation (Signed)
Anesthesia Evaluation    Airway        Dental   Pulmonary Current Smoker,          Cardiovascular hypertension,     Neuro/Psych    GI/Hepatic   Endo/Other  diabetes  Renal/GU      Musculoskeletal   Abdominal   Peds  Hematology   Anesthesia Other Findings   Reproductive/Obstetrics                             Anesthesia Physical Anesthesia Plan  ASA: III  Anesthesia Plan:    Post-op Pain Management:    Induction:   Airway Management Planned:   Additional Equipment:   Intra-op Plan:   Post-operative Plan:   Informed Consent:   Plan Discussed with:   Anesthesia Plan Comments:         Anesthesia Quick Evaluation

## 2015-01-09 NOTE — Interval H&P Note (Signed)
[facility]  History and Physical Update  The patient was interviewed and re-examined.  The patient's previous History and Physical has been reviewed and is unchanged from my consult except for: interval angiogram.  Based on the angiogram, I offered the patient: Right calf exploration, possible right femoral to posterior tibial artery bypass, possible right 1st and 2nd toe amputation vs right below-knee amputation.  The patient elected to do the right below-knee amputation.  I discussed in depth the nature of below-the-knee amputation with the patient, including risks, benefits, and alternatives.  The patient is aware that the risks of below-the-knee amputation include but are not limited to: bleeding, infection, myocardial infarction, stroke, death, failure to heal amputation wound, and possible need for more proximal amputation.  The patient is aware of the risks and agrees proceed forward with the procedure.   Adele Barthel, MD [facility] [place] Office: [phone] Pager: [phone]  01/09/2015, 10:20 AM

## 2015-01-09 NOTE — Progress Notes (Signed)
Chaplain visited with Mr. Rummer per RN request.   Mr. Endrizzi presented as being very anxious concerning his procedure. He noted several times that he just "wants to be all right."   Mr. Hausser expressed that when some care providers have spoken to him they "speak over [his] head" both metaphorically and in actuality. It would make him more comfortable if he were afforded some directness and clarity.   Mr. Westgate expressed that he served in two [place] campaigns and likened his current mental space to his war experience. He expressed much determination to embrace his uncertain future as an amputee.   He draws strength from his strong sense of spirituality and the love of his family.   Chaplain provided emotional and spiritual support as we explored his anxieties and navigated through his concerns with post-surgery recovery and life thereafter.   Pt requested follow-up.   Page if needed   Sherrilee Gilles 01/09/2015 [id number]

## 2015-01-09 NOTE — Progress Notes (Signed)
Reported off to [facility] in [place] stay, pt transported off unit to OR for surgery. IV intact to left forearm and infusing. Francis Gaines Harshitha Fretz RN.

## 2015-01-09 NOTE — Progress Notes (Signed)
Right Lower Extremity Vein Map    Right Great Saphenous Vein   Segment Diameter Comment  1. Origin 4.35mm   2. High Thigh 3.56mm   3. Mid Thigh 2.64mm   4. Low Thigh 4.52mm   5. At Knee 4.24mm   6. High Calf 3.56mm   7. Low Calf 2.8mm   8. Ankle 4.77mm                 Left Lower Extremity Vein Map    Left Great Saphenous Vein   Segment Diameter Comment  1. Origin 4.79mm   2. High Thigh 2.35mm   3. Mid Thigh 2.77mm   4. Low Thigh 2.62mm   5. At Knee  Unable to visualize.  6. High Calf  Unable to visualize.  7. Low Calf  Unable to visualize.  8. Ankle  Unable to visualize.                Left Small Saphenous Vein  Segment Diameter Comment  1. Origin 2.23mm   2. High Calf 3.2mm   3. Low Calf 3.18mm   4. Ankle 3.23mm                01/09/2015 8:27 AM Maudry Mayhew, RVT, RDCS, RDMS

## 2015-01-09 NOTE — Progress Notes (Signed)
   Daily Progress Note  Tried to contact spouse.  No answering machine on primary phone number.  Cell number is disconnected.  No one was present in the waiting area.  Adele Barthel, MD [facility] [place] Office: [phone] Pager: [phone]  01/09/2015, 12:18 PM

## 2015-01-09 NOTE — Progress Notes (Signed)
Pharmacy Consult: Antibiotics renal dose adjustment  45 YOM s/p R BKA, getting zinacef for post-op prophylaxis. Pharmacy is consulted for antibiotics renal dose adjustment. Pt has ESRD on HD TTS. He received pre-op ancef 2g at 1109. Plan for HD tomorrow. Afebrile, wbc wnl.  Plan: Zinacef 1.5 g IV x1 at 7AM as ordered, dose appropriate for abx coverage for 24 hrs post-op. Pharmacy sign off.   Thanks.  Maryanna Shape, PharmD, BCPS  Clinical Pharmacist  Pager: [phone]

## 2015-01-09 NOTE — Op Note (Signed)
OPERATIVE NOTE   PROCEDURE: right below-the-knee amputation  PRE-OPERATIVE DIAGNOSIS: right foot gangrene  POST-OPERATIVE DIAGNOSIS: same as above  SURGEON: Adele Barthel, MD  ASSISTANT(S): Gerri Lins, PAC   ANESTHESIA: general  ESTIMATED BLOOD LOSS: 50 cc  FINDING(S): 1.  Viable muscle in right calf 2.  Severely calcified tibial arteries: inadequate target vessels  SPECIMEN(S):  left below-the-knee amputation  INDICATIONS:   Garrett Salas is a 64 y.o. male who presents with right foot gangrene.  The patient is scheduled for a right below-the-knee amputation.  I discussed in depth with the patient the risks, benefits, and alternatives to this procedure.  The patient is aware that the risk of this operation included but are not limited to:  bleeding, infection, myocardial infarction, stroke, death, failure to heal amputation wound, and possible need for more proximal amputation.  The patient is aware of the risks and agrees proceed forward with the procedure.  DESCRIPTION:  After full informed written consent was obtained from the patient, the patient was brought back to the operating room, and placed supine upon the operating table.  Prior to induction, the patient received IV antibiotics.  The patient was then prepped and draped in the standard fashion for a below-the-knee amputation.  I placed a non-sterile tourniquet on the thigh prior to the procedure.  After obtaining adequate anesthesia, the patient was prepped and draped in the standard fashion for a below-the-knee amputation.  I marked out the anterior incision 10 cm distal to the tibial tuberosity and then the marked out a posterior flap that was one third of the circumference of the calf in length.  I then exsanguinated the leg with a Esmarch bandage and then inflated the tourniquet to 250 mm Hg.   I made the incisions for these flaps, and then dissected through the subcutaneous tissue, fascia, and muscle anteriorly.  I  elevated  the periosteal tissue superiorly so that the tibia was about 3 cm shorter than the anterior skin flap.  I then transected the tibia with a power saw and then took a wedge off the tibia anteriorly with the power saw.  Then I smoothed out the rough edges with a rasp.  In a similar fashion, I cut back the fibula about two centimeters higher than the level of the tibia with a bone cutter.  I put a bone hook into the distal tibia and then used a Lister knife to sharply develop a tissue plane through the muscle along the fibula.  In such fashion, the posterior flap was developed.  At this point, the specimen was passed off the field as the below-the-knee amputation.  At this point, I clamped all visibly bleeding arteries and veins using a combination of suture ligation with Vicryl suture and electrocautery.  The tourniquet was then deflated at this point.  Bleeding continued to be controlled with electrocautery and suture ligature.  The stump was washed off with sterile normal saline and no further active bleeding was noted.  I reapproximated the anterior and posterior fascia  with interrupted stitches of 2-0 Vicryl.  This was completed along the entire length of anterior and posterior fascia until there were no more loose space in the fascial line.  The skin was then  reapproximated with staples.  The stump was washed off and dried.  The incision was dressed with Adpatec and  then fluffs were applied.  Kerlix was wrapped around the leg and then gently an ACE wrap was applied.    COMPLICATIONS:  none  CONDITION: stable   Adele Barthel, MD [facility] Office: [phone] Pager: [phone]  01/09/2015, 12:10 PM

## 2015-01-09 NOTE — Transfer of Care (Signed)
Immediate Anesthesia Transfer of Care Note  Patient: Garrett Salas  Procedure(s) Performed: Procedure(s): AMPUTATION BELOW KNEE RIGHT LEG (Right)  Patient Location: PACU  Anesthesia Type:General  Level of Consciousness: awake  Airway & Oxygen Therapy: Patient Spontanous Breathing and Patient connected to nasal cannula oxygen  Post-op Assessment: Report given to RN and Post -op Vital signs reviewed and stable  Post vital signs: Reviewed and stable  Last Vitals:  Filed Vitals:   01/09/15 0632  BP: 189/74  Pulse: 76  Temp: 37 C  Resp: 18    Complications: No apparent anesthesia complications

## 2015-01-09 NOTE — Clinical Documentation Improvement (Signed)
Please identify any clinical conditions associated with the abnormal platelet values below, if any, and document in your progress note and carry over to the discharge summary.  Component      Platelets  Latest Ref Rng      150 - 400 K/uL  01/07/2015      127 (L)  01/08/2015     2:00 PM 105 (L)  01/09/2015     5:08 AM 103 (L)   Possible Clinical Conditions: -thrombocytopenia -other condition (please specify) -unable to determine  Thank you, Mateo Flow, RN [phone] Clinical Documentation Specialist

## 2015-01-09 NOTE — Progress Notes (Signed)
Subjective:   Seen in PACU s/p R BKA, no complaints, currently no pain  Objective: Vital signs in last 24 hours: Temp:  [97.3 F (36.3 C)-98.6 F (37 C)] 98.4 F (36.9 C) (02/24 1223) Pulse Rate:  [65-87] 81 (02/24 1345) Resp:  [8-22] 11 (02/24 1345) BP: (93-223)/(49-93) 223/74 mmHg (02/24 1345) SpO2:  [90 %-100 %] 90 % (02/24 1345) Weight:  [54.6 kg (120 lb 5.9 oz)-58.8 kg (129 lb 10.1 oz)] 54.6 kg (120 lb 5.9 oz) (02/24 [id number]) Weight change: 3.008 kg (6 lb 10.1 oz)  Intake/Output from previous day: 02/23 0701 - 02/24 0700 In: 710 [P.O.:710] Out: 1958  Intake/Output this shift: Total I/O In: 300 [I.V.:300] Out: -   Lab Results:  Recent Labs  01/08/15 1400 01/09/15 0508  WBC 5.1 5.1  HGB 10.9* 10.0*  HCT 32.9* 30.8*  PLT 105* 103*   BMET:  Recent Labs  01/07/15 1915 01/08/15 1400 01/09/15 0508  NA 135 135 139  K 3.7 5.5* 4.6  CL 94* 94* 101  CO2 28 26 30   GLUCOSE 146* 243* 52*  BUN 33* 39* 19  CREATININE 8.73* 10.00* 6.04*  CALCIUM 7.6* 7.6* 7.8*  ALBUMIN 3.3* 3.4*  --    No results for input(s): PTH in the last 72 hours. Iron Studies: No results for input(s): IRON, TIBC, TRANSFERRIN, FERRITIN in the last 72 hours.  Studies/Results: No results found.   Inpatient Medications: . [MAR Hold] amphetamine-dextroamphetamine  5 mg Oral Daily  . [MAR Hold] aspirin EC  81 mg Oral Daily  . [MAR Hold] calcitRIOL  0.5 mcg Oral Daily  . [MAR Hold] carvedilol  6.25 mg Oral BID WC  . dextrose  25 mL Intravenous Once  . [MAR Hold] famotidine  20 mg Oral Daily  . [MAR Hold] ferric gluconate (FERRLECIT/NULECIT) IV  62.5 mg Intravenous Weekly  . [MAR Hold] heparin  5,000 Units Subcutaneous 3 times per day  . HYDROmorphone      . [MAR Hold] insulin aspart  0-15 Units Subcutaneous TID WC  . [MAR Hold] insulin glargine  25 Units Subcutaneous QHS  . [MAR Hold] lisinopril  20 mg Oral Daily  . [MAR Hold] metoCLOPramide  5 mg Oral TID AC & HS  . [MAR Hold] pregabalin  75 mg  Oral TID  . [MAR Hold] sevelamer carbonate  3,200 mg Oral TID WC  . [MAR Hold] simvastatin  40 mg Oral QHS   EXAM: General appearance:  Alert, in no apparent distress  Resp:  CTA without rales, rhonchi, or wheezes Cardio:  RRR with Gr II/VI systolic murmur, no rub GI:  + BS, soft and nontender Extremities:  L BKA wrapped, no R LE edema Access:  AVF @ RUA with + bruit  Dialysis Orders: TTS @ [facility] 3:30 56 kg 2K/2.25Ca 400/A1.5 No heparin AVF @ RUA  Calcitriol 0.5 mcg Mircera 50 mcg q2wk (last on 2/9) Venofer 50 mg on Thurs  Assessment/Plan: 1. 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 Bridgett Larsson today, stable post-surgery. 2. ESRD - HD on TTS @ [facility], K 4.6. HD tomorrow. 3. HTN/volume - BP 223/74, Carvedilol 6.25 mg bid, Lisinopril 20 mg qd on hold; wt 54.6 kg, establish lower EDW. 4. Anemia - Hgb 10, Mircera last given 2/9, Fe qwk, check CBC pre-HD tomorrow. 5. 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 6. Nutrition - Alb 3.4, carb-mod  diet. 7. 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   LOS: 2 days   [name],[name] 01/09/2015,2:07 PM   Pt seen, examined, agree w assess/plan as above.  Kelly Splinter MD pager [phone]    cell [phone] 01/09/2015, 3:16 PM

## 2015-01-09 NOTE — Progress Notes (Signed)
Notified Dr. Tamala Julian of [place], patient asymptomatic. Per Dr. Tamala Julian will send Dextrose to [facility] with patient.

## 2015-01-09 NOTE — Anesthesia Postprocedure Evaluation (Signed)
  Anesthesia Post-op Note  Patient: [facility]  Procedure(s) Performed: Procedure(s): AMPUTATION BELOW KNEE RIGHT LEG (Right)  Patient Location: PACU  Anesthesia Type:General  Level of Consciousness: awake, alert , oriented and patient cooperative  Airway and Oxygen Therapy: Patient Spontanous Breathing  Post-op Pain: mild  Post-op Assessment: Post-op Vital signs reviewed, Patient's Cardiovascular Status Stable, Respiratory Function Stable, Patent Airway, No signs of Nausea or vomiting and Pain level controlled  Post-op Vital Signs: stable  Last Vitals:  Filed Vitals:   01/09/15 1629  BP: 184/57  Pulse: 88  Temp: 36.6 C  Resp:     Complications: No apparent anesthesia complications

## 2015-01-09 NOTE — Progress Notes (Signed)
Hypoglycemic Event  CBG: 38  Treatment: D50 61ml  Symptoms: diaphoretic  Follow-up CBG: Time 0657 CBG Result:158  Possible Reasons for Event: NPO p MN  Comments/MD notified: hypoglycemic protocol followed    Garrett Salas  Remember to initiate Hypoglycemia Order Set & complete

## 2015-01-09 NOTE — Progress Notes (Signed)
Inpatient Diabetes Program Recommendations  AACE/ADA: New Consensus Statement on Inpatient Glycemic Control (2013)  Target Ranges:  Prepandial:   less than 140 mg/dL      Peak postprandial:   less than 180 mg/dL (1-2 hours)      Critically ill patients:  140 - 180 mg/dL  Results for Garrett Salas, Garrett Salas (MRN [id number]) as of 01/09/2015 12:46  Ref. Range 01/09/2015 06:29 01/09/2015 06:57 01/09/2015 09:05 01/09/2015 10:10 01/09/2015 12:25  Glucose-Capillary Latest Range: 70-99 mg/dL 38 (LL) 158 (H) 73 70 99   Inpatient Diabetes Program Recommendations Insulin - Basal: Reduce Lantus to 10 units  Correction (SSI): reduce Novolog to sensitive scale TID  Thank you  Raoul Pitch BSN, RN,CDE Inpatient Diabetes Coordinator [phone] (team pager)

## 2015-01-09 NOTE — Progress Notes (Signed)
Pt arrived from Pacu via bed; pt A&O x4; verbally responsive; vitals taken, telemetry applied and verified; pt has Right BKA; stump dsg has compression wrap clean, dry and intact to stump. No s/s active bleeding or drainage noted. Will continue to monitor pt quietly. Pt in bed with call light within reach. P. Amo Lindwood Mogel Rn.

## 2015-01-10 ENCOUNTER — Encounter (HOSPITAL_COMMUNITY): Payer: Self-pay | Admitting: Vascular Surgery

## 2015-01-10 DIAGNOSIS — F068 Other specified mental disorders due to known physiological condition: Secondary | ICD-10-CM

## 2015-01-10 LAB — GLUCOSE, CAPILLARY
GLUCOSE-CAPILLARY: 165 mg/dL — AB (ref 70–99)
GLUCOSE-CAPILLARY: 142 mg/dL — AB (ref 70–99)
GLUCOSE-CAPILLARY: 108 mg/dL — AB (ref 70–99)
GLUCOSE-CAPILLARY: 115 mg/dL — AB (ref 70–99)
Glucose-Capillary: 116 mg/dL — ABNORMAL HIGH (ref 70–99)

## 2015-01-10 LAB — BASIC METABOLIC PANEL
Anion gap: 14 (ref 5–15)
BUN: 29 mg/dL — ABNORMAL HIGH (ref 6–23)
CALCIUM: 8 mg/dL — AB (ref 8.4–10.5)
CO2: 25 mmol/L (ref 19–32)
CREATININE: 7.64 mg/dL — AB (ref 0.50–1.35)
Chloride: 98 mmol/L (ref 96–112)
GFR calc Af Amer: 8 mL/min — ABNORMAL LOW (ref >90)
GFR calc non Af Amer: 7 mL/min — ABNORMAL LOW (ref >90)
GLUCOSE: 169 mg/dL — AB (ref 70–99)
Potassium: 4.8 mmol/L (ref 3.5–5.1)
SODIUM: 137 mmol/L (ref 135–145)

## 2015-01-10 LAB — CBC
HCT: 29.3 % — ABNORMAL LOW (ref 39.0–52.0)
Hemoglobin: 9.3 g/dL — ABNORMAL LOW (ref 13.0–17.0)
MCH: 31.3 pg (ref 26.0–34.0)
MCHC: 31.7 g/dL (ref 30.0–36.0)
MCV: 98.7 fL (ref 78.0–100.0)
PLATELETS: 115 10*3/uL — AB (ref 150–400)
RBC: 2.97 MIL/uL — ABNORMAL LOW (ref 4.22–5.81)
RDW: 14.9 % (ref 11.5–15.5)
WBC: 8.3 10*3/uL (ref 4.0–10.5)

## 2015-01-10 LAB — ALBUMIN: Albumin: 3.1 g/dL — ABNORMAL LOW (ref 3.5–5.2)

## 2015-01-10 LAB — PHOSPHORUS: Phosphorus: 5 mg/dL — ABNORMAL HIGH (ref 2.3–4.6)

## 2015-01-10 MED ORDER — HYDROMORPHONE HCL 1 MG/ML IJ SOLN
INTRAMUSCULAR | Status: AC
  Filled 2015-01-10: qty 1

## 2015-01-10 MED ORDER — MORPHINE SULFATE 4 MG/ML IJ SOLN
INTRAMUSCULAR | Status: AC
  Filled 2015-01-10: qty 1

## 2015-01-10 NOTE — Progress Notes (Signed)
Subjective:  Seen on dialysis, mild right stump pain, no other complaints  Objective: Vital signs in last 24 hours: Temp:  [97.9 F (36.6 C)-98.5 F (36.9 C)] 98 F (36.7 C) (02/25 0650) Pulse Rate:  [72-93] 93 (02/25 0650) Resp:  [8-22] 16 (02/25 0650) BP: (152-223)/(45-91) 200/91 mmHg (02/25 0650) SpO2:  [90 %-100 %] 98 % (02/25 0650) Weight:  [59 kg (130 lb 1.1 oz)] 59 kg (130 lb 1.1 oz) (02/25 0650) Weight change: 0.2 kg (7.1 oz)  Intake/Output from previous day: 02/24 0701 - 02/25 0700 In: 780 [P.O.:480; I.V.:300] Out: -  Intake/Output this shift:   Lab Results:  Recent Labs  01/09/15 1500 01/10/15 0421  WBC 8.5 8.3  HGB 8.2* 9.3*  HCT 25.3* 29.3*  PLT 111* 115*   BMET:  Recent Labs  01/08/15 1400 01/09/15 0508 01/09/15 1500 01/10/15 0421  NA 135 139  --  137  K 5.5* 4.6  --  4.8  CL 94* 101  --  98  CO2 26 30  --  25  GLUCOSE 243* 52*  --  169*  BUN 39* 19  --  29*  CREATININE 10.00* 6.04* 6.63* 7.64*  CALCIUM 7.6* 7.8*  --  8.0*  ALBUMIN 3.4*  --   --  3.1*   No results for input(s): PTH in the last 72 hours. Iron Studies: No results for input(s): IRON, TIBC, TRANSFERRIN, FERRITIN in the last 72 hours.  Studies/Results: No results found.   Inpatient Medications: . amphetamine-dextroamphetamine  5 mg Oral Daily  . aspirin EC  81 mg Oral Daily  . calcitRIOL  0.5 mcg Oral Daily  . carvedilol  6.25 mg Oral BID WC  . cefUROXime (ZINACEF)  IV  1.5 g Intravenous Once  . dextrose  25 mL Intravenous Once  . docusate sodium  100 mg Oral Daily  . famotidine  20 mg Oral Daily  . ferric gluconate (FERRLECIT/NULECIT) IV  62.5 mg Intravenous Weekly  . heparin  5,000 Units Subcutaneous 3 times per day  . insulin aspart  0-15 Units Subcutaneous TID WC  . insulin glargine  25 Units Subcutaneous QHS  . lisinopril  20 mg Oral Daily  . metoCLOPramide  5 mg Oral TID AC & HS  . morphine      . pantoprazole  40 mg Oral Daily  . pregabalin  75 mg Oral TID  .  sevelamer carbonate  3,200 mg Oral TID WC  . simvastatin  40 mg Oral QHS   EXAM: General appearance: Alert, in no apparent distress  Resp: CTA without rales, rhonchi, or wheezes Cardio: RRR with Gr II/VI systolic murmur, no rub GI: + BS, soft and nontender Extremities: L BKA wrapped, no R LE edema Access: AVF @ RUA with BFR 400  Dialysis Orders: TTS @ [facility] 3:30 56 kg 2K/2.25Ca 400/A1.5 No heparin AVF @ RUA  Calcitriol 0.5 mcg Mircera 50 mcg q2wk (last on 2/9) Venofer 50 mg on Thurs  Assessment/Plan: 1. 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 Bridgett Larsson 2/24, stable. 2. ESRD - HD on TTS @ [facility], K 4.8. HD today. 3. HTN/volume - BP 188/98, Carvedilol 6.25 mg bid, Lisinopril 20 mg qd on hold; wt 59 kg, but receiving IVF @125  cc/hr.  DC IVF, establish lower EDW. 4. Anemia - Hgb down to 9.3, Mircera last given 2/9, Fe qwk.  Aranesp 100 mcg today.. 5. Sec HPT - Ca 8 (8.7 corrected),  P 5, iPTH 323; Calcitriol 0.5 mcg, Renvela with meals. 6. Nutrition - Alb 3.1, carb-mod diet. 7. 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    LOS: 3 days   [name],[name] 01/10/2015,7:14 AM   Pt seen, examined and agree w A/P as above.  Kelly Splinter MD pager [phone]    cell [phone] 01/10/2015, 4:45 PM

## 2015-01-10 NOTE — Progress Notes (Signed)
This encounter was created in error - please disregard.

## 2015-01-10 NOTE — Consult Note (Addendum)
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 Bridgett Larsson   HPI: Garrett Salas is a 64 y.o. right handed male with tobacco abuse, hypertension, diabetes mellitus peripheral neuropathy as well as diabetic retinopathy, end-stage renal disease with hemodialysis. By report patient lives with family. Admitted 01/07/2015 with gangrenous changes of right foot and no relief with conservative care. Recent aortogram showed severely calcified tibial arteries. Preoperative clearance per cardiology services. 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 Bridgett Larsson. Hospital course pain management as well as bouts of confusion. Hemodialysis ongoing as per renal services. Subcutaneous heparin for DVT prophylaxis. Acute blood loss anemia 8.2 and monitored. Physical and occupational therapy evaluations are pending. M.D. has requested physical medicine rehabilitation consult. Patient in bed lying diagonally. He opens his eyes to commands.   Review of Systems  Gastrointestinal: Positive for constipation.  Musculoskeletal: Positive for myalgias and joint pain.  Neurological: Positive for weakness.  All other systems reviewed and are negative.  Past Medical History  Diagnosis Date  . Diabetes mellitus     Type 2. Diagnosed in mid 61s. Currently insulin requiring  . Hyperlipidemia   . ESRD (end stage renal disease) on dialysis     due to diabetic nephropathy. T/Th/Sat  . Tobacco abuse   . Diabetic neuropathy   . Diabetic retinopathy     legally blind  . Hypertension   . Dialysis patient    Past Surgical History  Procedure Laterality Date  . None    . Cardiac catheterization  05/06/2011    no significant CAD, EF 55-60%, LVEDP :15   . Av fistula placement    . Abdominal aortagram N/A 01/07/2015    Procedure: ABDOMINAL Maxcine Ham;  Surgeon: Conrad Rutledge, MD;  Location: [facility] CATH LAB;  Service: Cardiovascular;   Laterality: N/A;   Family History  Problem Relation Age of Onset  . Heart disease    . Alcohol abuse    . Diabetes Mother   . Diabetes Sister   . Diabetes Brother    Social History:  reports that he has been smoking Cigarettes.  He has a 15 pack-year smoking history. He has never used smokeless tobacco. He reports that he uses illicit drugs (Marijuana). He reports that he does not drink alcohol. Allergies: No Known Allergies Medications Prior to Admission  Medication Sig Dispense Refill  . albuterol (PROVENTIL HFA;VENTOLIN HFA) 108 (90 BASE) MCG/ACT inhaler Inhale 2 puffs into the lungs every 6 (six) hours as needed for wheezing or shortness of breath.    . amphetamine-dextroamphetamine (ADDERALL) 5 MG tablet Take 5 mg by mouth daily.     Marland Kitchen aspirin EC 81 MG tablet Take 81 mg by mouth daily.    Marland Kitchen HUMULIN 70/30 KWIKPEN (70-30) 100 UNIT/ML PEN Inject 5-20 Units into the skin 2 (two) times daily as needed (CBG over 175).   5  . HYDROcodone-acetaminophen (NORCO) 10-325 MG per tablet Take 1 tablet by mouth every 6 (six) hours as needed. (Patient taking differently: Take 1 tablet by mouth every 6 (six) hours as needed (pain). ) 60 tablet 0  . insulin glargine (LANTUS) 100 unit/mL SOPN Inject 25 Units into the skin at bedtime.    . metoCLOPramide (REGLAN) 5 MG tablet Take 5 mg by mouth 4 (four) times daily -  before meals and at bedtime.    Marland Kitchen oxyCODONE (OXY IR/ROXICODONE) 5 MG immediate release tablet Take 1 tablet (5 mg total) by mouth every  8 (eight) hours as needed for severe pain. 30 tablet 0  . pregabalin (LYRICA) 75 MG capsule Take 75 mg by mouth 3 (three) times daily.    . ranitidine (ZANTAC) 150 MG tablet Take 150 mg by mouth daily.     . sevelamer (RENVELA) 800 MG tablet Take 3,200 mg by mouth 3 (three) times daily with meals.     . silver sulfADIAZINE (SILVADENE) 1 % cream Apply 1 application topically daily. (Patient taking differently: Apply 1 application topically daily as needed (wound  care). ) 50 g 0  . simvastatin (ZOCOR) 40 MG tablet Take 40 mg by mouth daily.     . cephALEXin (KEFLEX) 500 MG capsule Take 1 capsule (500 mg total) by mouth 2 (two) times daily. (Patient not taking: Reported on 01/04/2015) 20 capsule 0  . ciprofloxacin (CIPRO) 250 MG tablet   0  . lisinopril (PRINIVIL,ZESTRIL) 20 MG tablet Take 20 mg by mouth daily.      Home: Home Living Family/patient expects to be discharged to:: Private residence Living Arrangements: Spouse/significant other, Children  Functional History:   Functional Status:  Mobility:          ADL:    Cognition: Cognition Orientation Level: Oriented X4    Blood pressure 162/81, pulse 83, temperature 98.2 F (36.8 C), temperature source Oral, resp. rate 18, height 5' 4.25" (1.632 m), weight 54.6 kg (120 lb 5.9 oz), SpO2 100 %. Physical Exam  Vitals reviewed. HENT:  Head: Normocephalic.  Eyes: EOM are normal.  Neck: Normal range of motion. Neck supple. No thyromegaly present.  Cardiovascular: Normal rate and regular rhythm.   Respiratory: Effort normal and breath sounds normal. No respiratory distress.  GI: Soft. Bowel sounds are normal. He exhibits no distension.  Neurological:  Patient is lethargic but arousable. He was able to state his name and age but needed cues for hospital situation. He preferred to keep his eyes closed during exam. Follow simple commands  Skin:  Right BKA site is dressed appropriately tender  Left lower extremity with muscle atrophy. He has intact sensation to pinch in the left toes. Upper extremity strength is 5/5 in the deltoid, biceps, triceps, grip Left lower extremity strength 3 minus at the hip flexor and knee extensor as well as ankle dorsiflexor and plantar flexor, question patient's level of effort  Results for orders placed or performed during the hospital encounter of 01/07/15 (from the past 24 hour(s))  Glucose, capillary     Status: Abnormal   Collection Time: 01/09/15  6:29  AM  Result Value Ref Range   Glucose-Capillary 38 (LL) 70 - 99 mg/dL   Comment 1 Notify RN   Glucose, capillary     Status: Abnormal   Collection Time: 01/09/15  6:57 AM  Result Value Ref Range   Glucose-Capillary 158 (H) 70 - 99 mg/dL  Glucose, capillary     Status: None   Collection Time: 01/09/15  9:05 AM  Result Value Ref Range   Glucose-Capillary 73 70 - 99 mg/dL  Glucose, capillary     Status: None   Collection Time: 01/09/15 10:10 AM  Result Value Ref Range   Glucose-Capillary 70 70 - 99 mg/dL  Glucose, capillary     Status: None   Collection Time: 01/09/15 12:25 PM  Result Value Ref Range   Glucose-Capillary 99 70 - 99 mg/dL   Comment 1 Notify RN    Comment 2 Documented in Char   CBC     Status: Abnormal  Collection Time: 01/09/15  3:00 PM  Result Value Ref Range   WBC 8.5 4.0 - 10.5 K/uL   RBC 2.52 (L) 4.22 - 5.81 MIL/uL   Hemoglobin 8.2 (L) 13.0 - 17.0 g/dL   HCT 25.3 (L) 39.0 - 52.0 %   MCV 100.4 (H) 78.0 - 100.0 fL   MCH 32.5 26.0 - 34.0 pg   MCHC 32.4 30.0 - 36.0 g/dL   RDW 14.8 11.5 - 15.5 %   Platelets 111 (L) 150 - 400 K/uL  Creatinine, serum     Status: Abnormal   Collection Time: 01/09/15  3:00 PM  Result Value Ref Range   Creatinine, Ser 6.63 (H) 0.50 - 1.35 mg/dL   GFR calc non Af Amer 8 (L) >90 mL/min   GFR calc Af Amer 9 (L) >90 mL/min  Glucose, capillary     Status: Abnormal   Collection Time: 01/09/15  3:44 PM  Result Value Ref Range   Glucose-Capillary 56 (L) 70 - 99 mg/dL  Glucose, capillary     Status: Abnormal   Collection Time: 01/09/15  4:25 PM  Result Value Ref Range   Glucose-Capillary 60 (L) 70 - 99 mg/dL  Glucose, capillary     Status: None   Collection Time: 01/09/15  5:18 PM  Result Value Ref Range   Glucose-Capillary 80 70 - 99 mg/dL  CBC     Status: Abnormal   Collection Time: 01/10/15  4:21 AM  Result Value Ref Range   WBC 8.3 4.0 - 10.5 K/uL   RBC 2.97 (L) 4.22 - 5.81 MIL/uL   Hemoglobin 9.3 (L) 13.0 - 17.0 g/dL   HCT  29.3 (L) 39.0 - 52.0 %   MCV 98.7 78.0 - 100.0 fL   MCH 31.3 26.0 - 34.0 pg   MCHC 31.7 30.0 - 36.0 g/dL   RDW 14.9 11.5 - 15.5 %   Platelets 115 (L) 150 - 400 K/uL   No results found.  Assessment/Plan: Diagnosis: Diabetes mellitus with peripheral vascular disease status post left BKA postop day #1 1. Does the need for close, 24 hr/day medical supervision in concert with the patient's rehab needs make it unreasonable for this patient to be served in a less intensive setting? Potentially 2. Co-Morbidities requiring supervision/potential complications: Diabetes, peripheral artery disease, end-stage renal disease, hypertension 3. Due to bladder management, bowel management, safety, skin/wound care, disease management, medication administration, pain management and patient education, does the patient require 24 hr/day rehab nursing? Yes 4. Does the patient require coordinated care of a physician, rehab nurse, PT (11-2 hrs/day, 5 days/week), OT (1-2 hrs/day, 5 days/week) and SLP (00.5-1 hrs/day, 5 days/week) to address physical and functional deficits in the context of the above medical diagnosis(es)? Yes Addressing deficits in the following areas: balance, endurance, locomotion, strength, transferring, bowel/bladder control, bathing, dressing, feeding, grooming, toileting, cognition and speech 5. Can the patient actively participate in an intensive therapy program of at least 3 hrs of therapy per day at least 5 days per week? Potentially 6. The potential for patient to make measurable gains while on inpatient rehab is fair 7. Anticipated functional outcomes upon discharge from inpatient rehab are min assist and wheelchair level  with PT, supervision and min assist with OT, N/A with SLP. 8. Estimated rehab length of stay to reach the above functional goals is: 10-12 days 9. Does the patient have adequate social supports and living environment to accommodate these discharge functional goals?  Potentially 10. Anticipated D/C setting: Home 11. Anticipated post  D/C treatments: HH therapy 12. Overall Rehab/Functional Prognosis: fair  RECOMMENDATIONS: This patient's condition is appropriate for continued rehabilitative care in the following setting: CIR is able to participate in PT and OT and still requiring at least min assist Patient has agreed to participate in recommended program. N/A Note that insurance prior authorization may be required for reimbursement for recommended care.  Comment: Patient confused,  question medications, postop day #1, rehabilitation admission coordinator to follow patient progress    01/10/2015

## 2015-01-10 NOTE — Progress Notes (Signed)
After pt arrived to the unit from pacu his cbg was checked to be 54 but asymptomatic, pt also reported being hungry because he was npo for surgery and had nothing to eat afterwards; pt was provided Nepro shake and snack whiles he waited for his dinner tray. Pt cbg went up to 60 when rechecked and pt was encourage to eat the snack provided as he drifted off to sleep. Pt ordered continuous fluid hanged, cbg up to 80. Pt dinner tray up, pt set up and pt sitting up in bed eating. Pt BP was in the 200's upon arrival to the floor, pt ordered prn hydralazine administered x2 as BP was still elevated. SBP came down to 177 and pt also voiced pain; prn pain medication administered; pt educated on keeping stump elevated on pillow and not bending the stump as pt was noted to keep on bending he stump. Pt remains sitting in bed and eating dinner. Call light within reach. Compression dsg to right BKA stump remains clean dry and intact with no active bleeding or drainage noted. Reported off to incoming RN. Francis Gaines Maytal Mijangos RN.

## 2015-01-10 NOTE — Progress Notes (Signed)
OT Cancellation Note  Patient Details Name: Garrett Salas MRN: [id number] DOB: September 17, 1951   Cancelled Treatment:    Reason Eval/Treat Not Completed: Fatigue/lethargy limiting ability to participate (2nd attempt) PLOF gathered from family and put in computer.  [name] 01/10/2015, 2:57 PM

## 2015-01-10 NOTE — Progress Notes (Signed)
PT Cancellation Note  Patient Details Name: Garrett Salas MRN: [id number] DOB: 06-Jul-1951   Cancelled Treatment:    Reason Eval/Treat Not Completed: Patient at procedure or test/unavailable (Pt currently in HD and unavailable)   Lanetta Inch [facility] 01/10/2015, 7:05 AM Elwyn Reach, [place]

## 2015-01-10 NOTE — Progress Notes (Addendum)
   [facility]  Subjective  - On HD currently, comfortable.   Objective 175/84 93 98 F (36.7 C) (Oral) 11 98%  Intake/Output Summary (Last 24 hours) at 01/10/15 0940 Last data filed at 01/09/15 1759  Gross per 24 hour  Intake    780 ml  Output      0 ml  Net    780 ml    Right BKA dressing in place C/D No active bleeding at incision site  Assessment/Planning: POD # 1 BKA  Will change dressing tomorrow Pending CIR consult for discharge planning  Laurence Slate [facility] 01/10/2015 9:40 AM --  Laboratory Lab Results:  Recent Labs  01/09/15 1500 01/10/15 0421  WBC 8.5 8.3  HGB 8.2* 9.3*  HCT 25.3* 29.3*  PLT 111* 115*   BMET  Recent Labs  01/09/15 0508 01/09/15 1500 01/10/15 0421  NA 139  --  137  K 4.6  --  4.8  CL 101  --  98  CO2 30  --  25  GLUCOSE 52*  --  169*  BUN 19  --  29*  CREATININE 6.04* 6.63* 7.64*  CALCIUM 7.8*  --  8.0*    COAG Lab Results  Component Value Date   INR 1.08 01/07/2015   No results found for: PTT   Addendum  Dressing off tomorrow. PT/OT evaluation pending.  SNF placement vs home PT/OT.  Adele Barthel, MD [facility] [place] Office: [phone] Pager: [phone]  01/10/2015, 10:30 AM

## 2015-01-10 NOTE — Progress Notes (Signed)
OT Cancellation Note  Patient Details Name: Garrett Salas MRN: [id number] DOB: [birth year]   Cancelled Treatment:    Reason Eval/Treat Not Completed: Patient at procedure or test/ unavailable (Pt currently in HD, will continue to follow.)  [name] 01/10/2015, 8:37 AM

## 2015-01-11 ENCOUNTER — Inpatient Hospital Stay (HOSPITAL_COMMUNITY): Payer: Medicare Other

## 2015-01-11 DIAGNOSIS — R404 Transient alteration of awareness: Secondary | ICD-10-CM

## 2015-01-11 DIAGNOSIS — E162 Hypoglycemia, unspecified: Secondary | ICD-10-CM

## 2015-01-11 DIAGNOSIS — G9340 Encephalopathy, unspecified: Secondary | ICD-10-CM

## 2015-01-11 LAB — BLOOD GAS, ARTERIAL
ACID-BASE EXCESS: 3.4 mmol/L — AB (ref 0.0–2.0)
Bicarbonate: 28.5 mEq/L — ABNORMAL HIGH (ref 20.0–24.0)
DRAWN BY: 252031
FIO2: 1 %
O2 Saturation: 98.1 %
PCO2 ART: 51.5 mmHg — AB (ref 35.0–45.0)
PO2 ART: 199 mmHg — AB (ref 80.0–100.0)
Patient temperature: 98.6
TCO2: 30 mmol/L (ref 0–100)
pH, Arterial: 7.361 (ref 7.350–7.450)

## 2015-01-11 LAB — BASIC METABOLIC PANEL
Anion gap: 9 (ref 5–15)
Anion gap: 14 (ref 5–15)
BUN: 21 mg/dL (ref 6–23)
BUN: 23 mg/dL (ref 6–23)
CALCIUM: 8.4 mg/dL (ref 8.4–10.5)
CALCIUM: 8.2 mg/dL — AB (ref 8.4–10.5)
CO2: 29 mmol/L (ref 19–32)
CO2: 26 mmol/L (ref 19–32)
CREATININE: 5.44 mg/dL — AB (ref 0.50–1.35)
Chloride: 101 mmol/L (ref 96–112)
Chloride: 99 mmol/L (ref 96–112)
Creatinine, Ser: 5.57 mg/dL — ABNORMAL HIGH (ref 0.50–1.35)
GFR calc Af Amer: 12 mL/min — ABNORMAL LOW (ref >90)
GFR calc non Af Amer: 10 mL/min — ABNORMAL LOW (ref >90)
GFR, EST AFRICAN AMERICAN: 11 mL/min — AB (ref >90)
GFR, EST NON AFRICAN AMERICAN: 10 mL/min — AB (ref >90)
GLUCOSE: 253 mg/dL — AB (ref 70–99)
Glucose, Bld: <20 mg/dL — CL (ref 70–99)
POTASSIUM: 3.8 mmol/L (ref 3.5–5.1)
POTASSIUM: 4.4 mmol/L (ref 3.5–5.1)
SODIUM: 139 mmol/L (ref 135–145)
Sodium: 139 mmol/L (ref 135–145)

## 2015-01-11 LAB — CBC WITH DIFFERENTIAL/PLATELET
BASOS ABS: 0 10*3/uL (ref 0.0–0.1)
BASOS PCT: 0 % (ref 0–1)
EOS ABS: 0 10*3/uL (ref 0.0–0.7)
EOS PCT: 1 % (ref 0–5)
HCT: 34.9 % — ABNORMAL LOW (ref 39.0–52.0)
Hemoglobin: 11.2 g/dL — ABNORMAL LOW (ref 13.0–17.0)
Lymphocytes Relative: 9 % — ABNORMAL LOW (ref 12–46)
Lymphs Abs: 0.7 10*3/uL (ref 0.7–4.0)
MCH: 32.6 pg (ref 26.0–34.0)
MCHC: 32.1 g/dL (ref 30.0–36.0)
MCV: 101.5 fL — AB (ref 78.0–100.0)
MONO ABS: 0.8 10*3/uL (ref 0.1–1.0)
MONOS PCT: 10 % (ref 3–12)
NEUTROS ABS: 6.5 10*3/uL (ref 1.7–7.7)
Neutrophils Relative %: 80 % — ABNORMAL HIGH (ref 43–77)
Platelets: 108 10*3/uL — ABNORMAL LOW (ref 150–400)
RBC: 3.44 MIL/uL — ABNORMAL LOW (ref 4.22–5.81)
RDW: 15.2 % (ref 11.5–15.5)
WBC: 8.1 10*3/uL (ref 4.0–10.5)

## 2015-01-11 LAB — GLUCOSE, CAPILLARY
GLUCOSE-CAPILLARY: 83 mg/dL (ref 70–99)
GLUCOSE-CAPILLARY: 64 mg/dL — AB (ref 70–99)
Glucose-Capillary: <10 mg/dL — CL (ref 70–99)
Glucose-Capillary: 168 mg/dL — ABNORMAL HIGH (ref 70–99)
Glucose-Capillary: 151 mg/dL — ABNORMAL HIGH (ref 70–99)
Glucose-Capillary: 163 mg/dL — ABNORMAL HIGH (ref 70–99)
Glucose-Capillary: 72 mg/dL (ref 70–99)
Glucose-Capillary: 110 mg/dL — ABNORMAL HIGH (ref 70–99)
Glucose-Capillary: 152 mg/dL — ABNORMAL HIGH (ref 70–99)
Glucose-Capillary: 134 mg/dL — ABNORMAL HIGH (ref 70–99)

## 2015-01-11 LAB — CBC
HEMATOCRIT: 32.6 % — AB (ref 39.0–52.0)
Hemoglobin: 10.4 g/dL — ABNORMAL LOW (ref 13.0–17.0)
MCH: 31.7 pg (ref 26.0–34.0)
MCHC: 31.9 g/dL (ref 30.0–36.0)
MCV: 99.4 fL (ref 78.0–100.0)
Platelets: 105 10*3/uL — ABNORMAL LOW (ref 150–400)
RBC: 3.28 MIL/uL — ABNORMAL LOW (ref 4.22–5.81)
RDW: 15.1 % (ref 11.5–15.5)
WBC: 8.3 10*3/uL (ref 4.0–10.5)

## 2015-01-11 LAB — LACTIC ACID, PLASMA: Lactic Acid, Venous: 2.5 mmol/L (ref 0.5–2.0)

## 2015-01-11 MED ORDER — DEXTROSE 50 % IV SOLN
INTRAVENOUS | Status: AC
  Filled 2015-01-11: qty 50

## 2015-01-11 MED ORDER — DEXTROSE 5 % IV SOLN
INTRAVENOUS | Status: DC
  Administered 2015-01-11: 11:00:00 via INTRAVENOUS

## 2015-01-11 MED ORDER — HYDRALAZINE HCL 20 MG/ML IJ SOLN
10.0000 mg | Freq: Four times a day (QID) | INTRAMUSCULAR | Status: DC | PRN
  Administered 2015-01-11 – 2015-01-14 (×5): 10 mg via INTRAVENOUS
  Filled 2015-01-11 (×5): qty 1

## 2015-01-11 MED ORDER — DARBEPOETIN ALFA 100 MCG/0.5ML IJ SOSY: 100.0000 ug | PREFILLED_SYRINGE | INTRAMUSCULAR | Status: DC

## 2015-01-11 MED ORDER — DARBEPOETIN ALFA 100 MCG/0.5ML IJ SOSY
100.0000 ug | PREFILLED_SYRINGE | Freq: Once | INTRAMUSCULAR | Status: DC
  Filled 2015-01-11: qty 0.5

## 2015-01-11 MED ORDER — DEXTROSE 50 % IV SOLN
INTRAVENOUS | Status: AC
  Administered 2015-01-11: 50 mL
  Filled 2015-01-11: qty 50

## 2015-01-11 MED ORDER — DEXTROSE-NACL 5-0.45 % IV SOLN
INTRAVENOUS | Status: DC
  Administered 2015-01-11: 09:00:00 via INTRAVENOUS

## 2015-01-11 MED ORDER — NALOXONE HCL 0.4 MG/ML IJ SOLN
INTRAMUSCULAR | Status: AC
  Administered 2015-01-11: 0.4 mg via INTRAVENOUS
  Filled 2015-01-11: qty 1

## 2015-01-11 MED ORDER — SEVELAMER CARBONATE 0.8 G PO PACK
3.2000 g | PACK | Freq: Three times a day (TID) | ORAL | Status: DC
  Administered 2015-01-11 – 2015-01-14 (×9): 3.2 g via ORAL
  Filled 2015-01-11 (×14): qty 1

## 2015-01-11 MED ORDER — NALOXONE HCL 0.4 MG/ML IJ SOLN
0.4000 mg | INTRAMUSCULAR | Status: AC | PRN
  Administered 2015-01-11 (×2): 0.4 mg via INTRAVENOUS

## 2015-01-11 MED FILL — Medication: Qty: 1 | Status: AC

## 2015-01-11 NOTE — Progress Notes (Signed)
Rehab admissions - I am following pt's case in follow up to rehab MD consult completed yesterday. I have made note of pt's transfer to ICU earlier this morning.  Per MD on 2-26: Patient presented to the ICU after an episode of non-responsiveness. At the time, his glucose was found to be <20. He was given 2 doses of Narcan and multiple amps of D50, with subsequent improvement of his mental status.  Rehab MD states pt is a possible candidate: "CIR if able to participate in PT and OT and still requiring at least min assist. "  At this time, pt has not been evaluated by PT/OT. I will follow pt's case and see how he progresses with therapies.  Please call me with any questions. Thanks.  Nanetta Batty, PT Rehabilitation Admissions Coordinator [phone]

## 2015-01-11 NOTE — Progress Notes (Signed)
UR Completed.  [phone]  

## 2015-01-11 NOTE — Care Management Note (Addendum)
    Page 1 of 2   01/14/2015     4:00:40 PM CARE MANAGEMENT NOTE 01/14/2015  Patient:  Garrett Salas, Garrett Salas   Account Number:  [id number]  Date Initiated:  01/11/2015  Documentation initiated by:  [facility]  Subjective/Objective Assessment:   ESRD patient with new R BKA.  Became unresponsvie agonal breathing - tx to ICU - hypoglycemic.     Action/Plan:   D/C to home when medically stable   Anticipated DC Date:  01/15/2015   Anticipated DC Plan:  [place]  CM consult      [place]   Choice offered to / List presented to:  C-1 Patient        [place] arranged  [place] [facility]      [place].   Status of service:  Completed, signed off Medicare Important Message given?  YES (If response is "NO", the following Medicare IM given date fields will be blank) Date Medicare IM given:  01/11/2015 Medicare IM given by:  [facility] Date Additional Medicare IM given:  01/14/2015 Additional Medicare IM given by:  Whitman Hero  Discharge Disposition:  [place]  Per UR Regulation:  Reviewed for med. necessity/level of care/duration of stay  If discussed at [place] of Stay Meetings, dates discussed:    Comments:  01/14/2015 @ [place], BSN CM [facility]) @  [phone] per conversation  with CM pt has 33.5 hrs / week with [facility], MWF 7a-12:30pm and T/TH/SAT/SUN 7a- 10a. States pt is established with them not [facility] services. [facility] notified and referral cancelled. Order for [facility] fax[facility] to [facility], fax # [phone].     01/14/2015 @ [place] RN, BSN Recommendations for HHPT per PT , pt made aware and choice given. Pt stated would like to use [facility] srevices. States already has  established relationship with [facility] HOME CARE services. Referral made with [facility] spoke with Joellen Jersey for HHPT.   Contact:  [place] [phone]

## 2015-01-11 NOTE — Progress Notes (Signed)
Patient was found unresponsive this morning.  At that time, blood sugar was not measurable.  He was also having agonal respirations and significant secretions.  He was transferred to the unit.  Once in the ICU, he lost IV access.  This was shortly regained.  He then became more alert, however still unresponsive to painful stimuli.  He was maintaining his oxygen levels at 100% on a nonrebreather.  0.4 mg of Narcan were administered, the patient had had 2 mg morphine around 5 AM.  He again became more alert but still not responding to verbal commands.  He was moving all 4 extremities.  His dressing is intact.  Hypoglycemia: Blood sugar was repeated and found to be 86.  An additional amp of D50 was given. Pulmonary: The patient appears to be able to protect his airway, however he does not follow commands.  He is moving all 4 extremities.  I do not think he needs to be intubated at this time but would have a low threshold if he continues to not follow commands.  He does apparently have significant secretions.  Appreciate CCM's assistance   Garrett Salas

## 2015-01-11 NOTE — Progress Notes (Signed)
PT Cancellation Note  Patient Details Name: Garrett Salas MRN: [id number] DOB: 07/20/1951   Cancelled Treatment:    Reason Eval/Treat Not Completed: Medical issues which prohibited therapy (Pt just transferred to ICU with unresponsiveness. Will hold til next date)   Melford Aase 01/11/2015, 7:21 AM Elwyn Reach, [place]

## 2015-01-11 NOTE — Significant Event (Signed)
Rapid Response Event Note Called per floor RN for pt unrssponsive with critical low CBG. D50 1 amp given before my arrival.   Overview: Time Called: 0629 Arrival Time: 0630 Event Type: Respiratory  Initial Focused Assessment: Pt unresponsive, agonal breathing rr 6. Gurgling breath sounds. po2 sat 100% on [place].  Does not respond to painful stimuli. Hypertensive. SR 80s  Interventions: Pt placed on NRB. ABG done and pending upon tranfer.CBG checked upon my arrival 168. Vascular MD paged and updated. Pt  transfered on zoll monitor to 2 M15 for possible intubation. Upon arrival to 2 M pt moving all four extremeties but still not following commands. PCCM Resident and Dr. Trula Slade at bedside. RRT report provided to ICU days RN.  Event Summary: Name of Physician Notified: Dr. Chaya Jan Vascular MD at [phone]    at    Outcome: Transferred (Comment) (74M)  Event End Time: 0710  Garrett Salas

## 2015-01-11 NOTE — Progress Notes (Signed)
   Daily Progress Note  Assessment/Planning: POD #2 s/p L BKA, delirium, recurrent hypoglycemia   Found unresponsive this AM: likely combination of narcotics and hypoglycemia  MIVF: D5 1/2 NS @ 75 cc/hr  Hold all narcotics until confusion resolved  Pt has intermittent period of improved mentation throughout his admission  Delirium work-up  Doubt this pt is CIR candidate, suspect might not be a home PT/OT candidate so SNF placement may be needed once delirium resolved  Subjective  - 2 Days Post-Op  Transferred from floor unresponsive  Objective Filed Vitals:   01/10/15 2051 01/11/15 0007 01/11/15 0109 01/11/15 0456  BP: 157/60 170/70 158/62 172/66  Pulse: 87 80 83 71  Temp: 98.4 F (36.9 C)   98 F (36.7 C)  TempSrc: Oral   Axillary  Resp: 14   16  Height:      Weight:      SpO2: 94%   90%    Intake/Output Summary (Last 24 hours) at 01/11/15 0728 Last data filed at 01/10/15 1040  Gross per 24 hour  Intake      0 ml  Output   3000 ml  Net  -3000 ml   GEN  confused, opening eyes PULM  CTAB CV  RRR GI  soft, NTND VASC  L BKA viable  Laboratory CBC    Component Value Date/Time   WBC 8.3 01/11/2015 0403   HGB 10.4* 01/11/2015 0403   HCT 32.6* 01/11/2015 0403   PLT 105* 01/11/2015 0403    BMET    Component Value Date/Time   NA 139 01/11/2015 0403   K 3.8 01/11/2015 0403   CL 101 01/11/2015 0403   CO2 29 01/11/2015 0403   GLUCOSE <20* 01/11/2015 0403   BUN 21 01/11/2015 0403   CREATININE 5.44* 01/11/2015 0403   CALCIUM 8.4 01/11/2015 0403   GFRNONAA 10* 01/11/2015 0403   GFRAA 12* 01/11/2015 0403   ABG    Component Value Date/Time   PHART 7.361 01/11/2015 0635   PCO2ART 51.5* 01/11/2015 0635   PO2ART 199.0* 01/11/2015 0635   HCO3 28.5* 01/11/2015 0635   TCO2 30.0 01/11/2015 0635   O2SAT 98.1 01/11/2015 [id number]     Adele Barthel, MD [facility] [place]: [phone] Pager: [phone]  01/11/2015, 7:28  AM

## 2015-01-11 NOTE — Consult Note (Signed)
PULMONARY / CRITICAL CARE MEDICINE   Name: Garrett Salas MRN: [id number] DOB: 15-Mar-1951    ADMISSION DATE:  01/07/2015 CONSULTATION DATE:  01/11/2015  REFERRING MD :  Adele Barthel, MD  CHIEF COMPLAINT:  Hypoglycemia  INITIAL PRESENTATION: Patient presented to the ICU after an episode of non-responsiveness with agonal breathing requiring NRB. At the time, his glucose was found to be <20. He was given 2 doses of Narcan and multiple amps of D50, with subsequent improvement of his mental status.  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 Patient was admitted to vascular surgery service and underwent r BKA on 2/24. Post op course appears to have been initially complicated by some delirium. Patient then started to have increased altered mental status with agonal breathing and hypoglycemia. NRB placed and patient transferred to ICU for possible intubation.  PAST MEDICAL HISTORY :   has a past medical history of Diabetes mellitus; Hyperlipidemia; ESRD (end stage renal disease) on dialysis; Tobacco abuse; Diabetic neuropathy; Diabetic retinopathy; Hypertension; and Dialysis patient.  has past surgical history that includes none; Cardiac catheterization (05/06/2011); AV fistula placement; abdominal aortagram (N/A, 01/07/2015); and Amputation (Right, 01/09/2015). Prior to Admission medications   Medication Sig Start Date End Date Taking? Authorizing Provider  albuterol (PROVENTIL HFA;VENTOLIN HFA) 108 (90 BASE) MCG/ACT inhaler Inhale 2 puffs into the lungs every 6 (six) hours as needed for wheezing or shortness of breath.   Yes Historical Provider, MD  amphetamine-dextroamphetamine (ADDERALL) 5 MG tablet Take 5 mg by mouth daily.  11/21/14  Yes Historical Provider, MD  aspirin EC 81 MG tablet Take 81 mg by mouth daily.    Yes Historical Provider, MD  HUMULIN 70/30 KWIKPEN (70-30) 100 UNIT/ML PEN Inject 5-20 Units into the skin 2 (two) times daily as needed (CBG over 175).  11/27/14  Yes Historical Provider, MD  HYDROcodone-acetaminophen (NORCO) 10-325 MG per tablet Take 1 tablet by mouth every 6 (six) hours as needed. Patient taking differently: Take 1 tablet by mouth every 6 (six) hours as needed (pain).  12/31/14  Yes Richard Blenda Mounts, DPM  insulin glargine (LANTUS) 100 unit/mL SOPN Inject 25 Units into the skin at bedtime.   Yes Historical Provider, MD  metoCLOPramide (REGLAN) 5 MG tablet Take 5 mg by mouth 4 (four) times daily -  before meals and at bedtime.   Yes Historical Provider, MD  oxyCODONE (OXY IR/ROXICODONE) 5 MG immediate release tablet Take 1 tablet (5 mg total) by mouth every 8 (eight) hours as needed for severe pain. 01/04/15  Yes Conrad Stockwell, MD  pregabalin (LYRICA) 75 MG capsule Take 75 mg by mouth 3 (three) times daily.   Yes Historical Provider, MD  ranitidine (ZANTAC) 150 MG tablet Take 150 mg by mouth daily.  01/24/14  Yes Historical Provider, MD  sevelamer (RENVELA) 800 MG tablet Take 3,200 mg by mouth 3 (three) times daily with meals.    Yes Historical Provider, MD  silver sulfADIAZINE (SILVADENE) 1 % cream Apply 1 application topically daily. Patient taking differently: Apply 1 application topically daily as needed (wound care).  04/18/14  Yes Richard Blenda Mounts, DPM  simvastatin (ZOCOR) 40 MG tablet Take 40 mg by mouth daily.    Yes Historical Provider, MD  cephALEXin (KEFLEX) 500 MG capsule Take 1 capsule (500 mg total) by mouth  2 (two) times daily. Patient not taking: Reported on 01/04/2015 11/26/14   Harriet Masson, DPM  ciprofloxacin (CIPRO) 250 MG tablet  11/29/14   Historical Provider, MD  lisinopril (PRINIVIL,ZESTRIL) 20 MG tablet Take 20 mg by mouth daily.    Historical Provider, MD   No Known Allergies  FAMILY HISTORY:  has no family status information on file.  SOCIAL HISTORY:  reports  that he has been smoking Cigarettes.  He has a 15 pack-year smoking history. He has never used smokeless tobacco. He reports that he uses illicit drugs (Marijuana). He reports that he does not drink alcohol.  REVIEW OF SYSTEMS:   Gen: Denies fever, chills HEENT: Denies dysphagia PULM: Denies shortness of breath, cough, sputum production, hemoptysis, wheezing CV: Denies chest pain GI: Denies abdominal pain, nausea, vomiting GU: Denies dysuria, hematuria Derm: Denies rash, dry skin, scaling Heme: Denies easy bruising, bleeding Neuro: Denies headache, numbness, weakness, slurred speech, loss of memory or consciousness  SUBJECTIVE:   VITAL SIGNS: Temp:  [97.4 F (36.3 C)-98.4 F (36.9 C)] 97.4 F (36.3 C) (02/26 0816) Pulse Rate:  [71-99] 81 (02/26 0800) Resp:  [11-18] 18 (02/26 0800) BP: (157-186)/(60-95) 186/91 mmHg (02/26 0800) SpO2:  [90 %-100 %] 100 % (02/26 0800) Weight:  [123 lb 7.3 oz (56 kg)] 123 lb 7.3 oz (56 kg) (02/25 1040) HEMODYNAMICS:   VENTILATOR SETTINGS:   INTAKE / OUTPUT:  Intake/Output Summary (Last 24 hours) at 01/11/15 0859 Last data filed at 01/10/15 1040  Gross per 24 hour  Intake      0 ml  Output   3000 ml  Net  -3000 ml    PHYSICAL EXAMINATION: General:  Chronically ill appearing, eating breakfast, no distress Neuro:  Alert, oriented to person, no focal deficit HEENT:  Pupils equal and 94mm, minimally reactive to light, EOMI, moist mucous membranes, poor dentition Cardiovascular:  Regular rate and rhythm, no murmur Lungs:  Right lung field with rhonchi, normal work of breathing Abdomen:  Soft, non-tender, non-distended Musculoskeletal:  R. BKA with staples, wound appears clean. Left leg with no edema, no DP palpable Skin:  No rashes or lesions noted  LABS:  CBC  Recent Labs Lab 01/10/15 0421 01/11/15 0403 01/11/15 0715  WBC 8.3 8.3 8.1  HGB 9.3* 10.4* 11.2*  HCT 29.3* 32.6* 34.9*  PLT 115* 105* 108*   Coag's  Recent Labs Lab  01/07/15 1915  INR 1.08   BMET  Recent Labs Lab 01/10/15 0421 01/11/15 0403 01/11/15 0715  NA 137 139 139  K 4.8 3.8 4.4  CL 98 101 99  CO2 25 29 26   BUN 29* 21 23  CREATININE 7.64* 5.44* 5.57*  GLUCOSE 169* <20* 253*   Electrolytes  Recent Labs Lab 01/08/15 1400  01/10/15 0421 01/11/15 0403 01/11/15 0715  CALCIUM 7.6*  < > 8.0* 8.4 8.2*  PHOS 9.3*  --  5.0*  --   --   < > = values in this interval not displayed. Sepsis Markers No results for input(s): LATICACIDVEN, PROCALCITON, O2SATVEN in the last 168 hours. ABG  Recent Labs Lab 01/11/15 0635  PHART 7.361  PCO2ART 51.5*  PO2ART 199.0*   Liver Enzymes  Recent Labs Lab 01/07/15 1915 01/08/15 1400 01/10/15 0421  AST 27  --   --   ALT 16  --   --   ALKPHOS 77  --   --   BILITOT 0.7  --   --   ALBUMIN 3.3* 3.4* 3.1*   Cardiac Enzymes No  results for input(s): TROPONINI, PROBNP in the last 168 hours. Glucose  Recent Labs Lab 01/10/15 1629 01/10/15 2111 01/11/15 0608 01/11/15 0614 01/11/15 0630 01/11/15 0710  GLUCAP 116* 115* <10* <10* 168* 83    Imaging Dg Chest Port 1 View  01/11/2015   CLINICAL DATA:  Altered mental status  EXAM: PORTABLE CHEST - 1 VIEW  COMPARISON:  03/10/2011  FINDINGS: Cardiomegaly is noted. There is right upper lobe perihilar atelectasis or infiltrate. No pulmonary edema.  IMPRESSION: Cardiomegaly. Right upper lobe perihilar atelectasis or infiltrate. No pulmonary edema.   Electronically Signed   By: Lahoma Crocker M.D.   On: 01/11/2015 08:12    ASSESSMENT / PLAN:  PULMONARY A: RUL Atelectasis P:   Keep O2 >92%  CARDIOVASCULAR A: Hypertension P:  Carvedilol 6.25mg  BID Lisinopril 20mg  qD  RENAL A:  ESRD P:   Dialysis per renal, last session 2/25  GASTROINTESTINAL A:  No acute issues P:   Protonix 40mg  Reglan 5mg  TID Colace Carb modified diet  MUSCULOSKELETAL A: Hx of dry gangrene, s/p right BKA on 2/24 POD #2 P: Management per vascular  surgery  HEMATOLOGIC A:  Anemia, improved Thrombocytopenia, stable P:  Watch CBCs subq heparin  INFECTIOUS A:  No acute issues P:   Watch fever curve  ENDOCRINE A:  DM   P:   SSI  NEUROLOGIC A:  Delirium, possibly related to hypoglycemia vs narcotics vs post-surgery P:   RASS goal: 0    FAMILY  - Updates: Wife updated at bedside  S/P R BKA, presents to the ICU found unresponsive and agonally breathing.  Upon presenting to the ICU the patient was noted to be hypoglycemic and with reversal of that the patient became responsive.  He also has been getting morphine for pain. Now awake and interactive.  Follows command.  D/C all narcotics.  Fentanyl if needed PRN.  Diet and D5W as ordered.  Transfer to SDU.  PCCM will sign off, please call back if needed.  The patient is critically ill with multiple organ systems failure and requires high complexity decision making for assessment and support, frequent evaluation and titration of therapies, application of advanced monitoring technologies and extensive interpretation of multiple databases.   Critical Care Time devoted to patient care services described in this note is  35  Minutes. This time reflects time of care of this signee Dr Jennet Maduro. This critical care time does not reflect procedure time, or teaching time or supervisory time of PA/NP/Med student/Med Resident etc but could involve care discussion time.  Rush Farmer, M.D. [facility] Pulmonary/Critical Care Medicine. Pager: [phone]. After hours pager: [phone].  01/11/2015, 8:59 AM

## 2015-01-11 NOTE — Progress Notes (Signed)
NUTRITION BRIEF NOTE  Pt identified due to Malnutrition Screening Tool  Wt Readings from Last 10 Encounters:  01/10/15 123 lb 7.3 oz (56 kg)  01/04/15 123 lb 7.3 oz (56 kg)  05/18/11 124 lb (56.246 kg)  04/27/11 125 lb (56.7 kg)  03/25/11 120 lb (54.432 kg)   Body mass index is 21.03 kg/(m^2). Pt meets criteria for Normal based on current BMI.  Current diet order is Carb Modified. Wife in room upon assessment. Per wife, pt has had a good appetite with no significant wt changes pta; no complaints of nausea, vomiting or diarrhea.   Labs and medications reviewed. NFPE did not show any body fat or muscle mass depletion.  Wife reported no difficulty chewing or swallowing.  No nutrition intervention warranted at this time. If nutrition issues arise, please consult RD.   Wynona Dove, MS Dietetic Intern Pager: [phone]

## 2015-01-11 NOTE — Progress Notes (Signed)
Patient capillary blood sugar at 1620 was 64. Started hypoglycemia protocol. Gave pt 230 mL orange juice. Checked again at 1710. CBG was 72. Gave another 230 mL apple juice. Will continue to monitor

## 2015-01-11 NOTE — Progress Notes (Signed)
CRITICAL VALUE ALERT  Critical value received:  Lactic acid 2.5  Date of notification: 01-11-15  Time of notification:  0935  Critical value read back: yes  Nurse who received alert:  Donata Duff RN  MD notified (1st page):  MD on floor and informed of value  Time of first page:  0940  MD notified (2nd page):  Time of second page:  Responding MD: Gardenia Phlegm, MD  Time MD responded:  [phone]

## 2015-01-11 NOTE — Progress Notes (Signed)
      Patient is alert, but not oriented.  He just moans.  Left BKA clean and dry.  Assessment/Planning: POD #2 s/p L BKA, delirium, recurrent hypoglycemia  Garrett Salas MAUREEN PA-C

## 2015-01-11 NOTE — Clinical Social Work Note (Addendum)
Clinical Social Worker received a call from case manager, Christena Deem of [facility] in regards to patient's status and discharge plans. Per case manager pt is a recipient of the [place] (CAP) and receives 38 hours per week of home health services including nursing care, social work and PT/OT as needed. Pt also receives dialysis and will have a spot in the CAP program open for 30 days.   Pt has a consult for CIR however has not been evaluated by PT/OT at this time. CSW will sign off for now as social work intervention is not required however will remain available as needed.   Case Manager, Christena Deem [phone].  Glendon Axe, MSW, LCSWA [phone] 01/11/2015 12:16 PM

## 2015-01-11 NOTE — Progress Notes (Signed)
01/11/2015 12:17 AM The patient had a manual blood pressure of 170/70 and his heart rate was about 80 beats per minute. The patient does have pain because of his right BKA and he has pain medicines ordered but nothing for high blood pressure. Dr. Trula Slade was notified and he said to order hydralazine 10 mg IV every 6 hours as needed for systolic blood pressure greater than 160.  Will continue to monitor the patient. Lupita Dawn

## 2015-01-11 NOTE — Progress Notes (Signed)
01/11/2015 7:53 AM This morning the nurse tech showed me the glucometer which showed the patient's blood glucose to be extremely low.  I went to see the patient and saw he had agonal breathing, his lung sounds were junky, and he was unresponsive even when shaken. Last night the patient had a blood glucose level of 115 and was given 25 Units of Lantus insulin around 2200.  I notified the charge nurse who said to get an amp of dextrose to give STAT. Afterward, I called the rapid response nurse who came and assessed him. We took a new set of vital signs, and his heart rate was 81 and he had an oxygen level of 98%, but his blood pressure had risen to 180s/90s as it did last night. After the dextrose was given, the patient's blood glucose was taken again and showed a level of 168. The rapid response nurse had a non-rebreather mask placed on the patient.  Dr. Trula Slade was informed of the patient's current condition and ordered to have the patient moved to the ICU for closer observation. Once the order was put in we quickly moved the patient to the [facility] unit in room 15. I gave report to the nurse in the ICU once the patient was moved to a different bed.  I called the patient's wife and informed her of what had occurred this morning and where the patient had been moved to. Garrett Salas

## 2015-01-11 NOTE — Progress Notes (Signed)
OT Cancellation Note  Patient Details Name: Garrett Salas MRN: [id number] DOB: 1951/07/07   Cancelled Treatment:    Reason Eval/Treat Not Completed: Medical issues which prohibited therapy. (Pt just transferred to ICU with unresponsiveness. Will follow up on 01/14/15)  Almon Register [id number] 01/11/2015, 7:27 AM

## 2015-01-11 NOTE — Progress Notes (Addendum)
Subjective:  Hypoglycemic episode overnight, also very confused. This am BS's better but pt still confused, also jerking of extremities  Objective: Vital signs in last 24 hours: Temp:  [97.4 F (36.3 C)-98.4 F (36.9 C)] 98.2 F (36.8 C) (02/26 1149) Pulse Rate:  [71-87] 84 (02/26 0900) Resp:  [14-18] 14 (02/26 0900) BP: (141-186)/(60-91) 141/73 mmHg (02/26 0900) SpO2:  [90 %-100 %] 96 % (02/26 0900) Weight change: -3 kg (-6 lb 9.8 oz)  Intake/Output from previous day: 02/25 0701 - 02/26 0700 In: -  Out: 3000  Intake/Output this shift: Total I/O In: 75 [I.V.:75] Out: -  Lab Results:  Recent Labs  01/11/15 0403 01/11/15 0715  WBC 8.3 8.1  HGB 10.4* 11.2*  HCT 32.6* 34.9*  PLT 105* 108*   BMET:  Recent Labs  01/08/15 1400  01/10/15 0421 01/11/15 0403 01/11/15 0715  NA 135  < > 137 139 139  K 5.5*  < > 4.8 3.8 4.4  CL 94*  < > 98 101 99  CO2 26  < > 25 29 26   GLUCOSE 243*  < > 169* <20* 253*  BUN 39*  < > 29* 21 23  CREATININE 10.00*  < > 7.64* 5.44* 5.57*  CALCIUM 7.6*  < > 8.0* 8.4 8.2*  ALBUMIN 3.4*  --  3.1*  --   --   < > = values in this interval not displayed. No results for input(s): PTH in the last 72 hours. Iron Studies: No results for input(s): IRON, TIBC, TRANSFERRIN, FERRITIN in the last 72 hours.  Studies/Results: No results found.   Inpatient Medications: . amphetamine-dextroamphetamine  5 mg Oral Daily  . aspirin EC  81 mg Oral Daily  . calcitRIOL  0.5 mcg Oral Daily  . carvedilol  6.25 mg Oral BID WC  . cefUROXime (ZINACEF)  IV  1.5 g Intravenous Once  . dextrose  25 mL Intravenous Once  . dextrose      . docusate sodium  100 mg Oral Daily  . famotidine  20 mg Oral Daily  . ferric gluconate (FERRLECIT/NULECIT) IV  62.5 mg Intravenous Weekly  . heparin  5,000 Units Subcutaneous 3 times per day  . insulin aspart  0-15 Units Subcutaneous TID WC  . lisinopril  20 mg Oral Daily  . metoCLOPramide  5 mg Oral TID AC & HS  . pregabalin  75  mg Oral TID  . sevelamer carbonate  3.2 g Oral TID WC  . simvastatin  40 mg Oral QHS   EXAM: Gen: awake, no distress  Resp: CTA without rales, rhonchi, or wheezes Cardio: RRR with Gr II/VI systolic murmur, no rub GI: + BS, soft and nontender Extremities: L BKA wrapped, no R LE edema Access: AVF @ RUA with BFR 400 Neuro: is oriented to person only  Dialysis: TTS @ [facility] 3:30 56 kg 2K/2.25Ca 400/A1.5 No heparin AVF @ RUA  Calcitriol 0.5 mcg Mircera 50 mcg q2wk (last on 2/9) Venofer 50 mg on Thurs  Assessment: 1. Confusion / asterixis - does not improve w Narcan, may be effect of severe hypoglycemia episode overnight.  Stable otherwise. On IV D5W. Narcotics on hold.  2. Hypoglycemia - on d5W 3. R foot gagnrene, s/p R BKA 2/24 4. ESRD on HD  5. HTN/volume - cont meds, HD in am 6. Anemia - Hgb down to 9.3, Mircera last given 2/9, Fe qwk.  Aranesp 100 mcg weekly here 7. Sec HPT - Ca 8 (8.7 corrected), P 5, iPTH 323;  Calcitriol 0.5 mcg, Renvela with meals. 8. Nutrition - Alb 3.1, carb-mod diet. 9. 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   Plan - HD tomorrow. Get vol down 2-3 under prior dry wt  Kelly Splinter MD pager [phone]    cell [phone] 01/11/2015, 1:34 PM

## 2015-01-12 LAB — RENAL FUNCTION PANEL
ANION GAP: 13 (ref 5–15)
Albumin: 2.8 g/dL — ABNORMAL LOW (ref 3.5–5.2)
BUN: 36 mg/dL — AB (ref 6–23)
CO2: 26 mmol/L (ref 19–32)
Calcium: 8.1 mg/dL — ABNORMAL LOW (ref 8.4–10.5)
Chloride: 90 mmol/L — ABNORMAL LOW (ref 96–112)
Creatinine, Ser: 6.83 mg/dL — ABNORMAL HIGH (ref 0.50–1.35)
GFR, EST AFRICAN AMERICAN: 9 mL/min — AB (ref >90)
GFR, EST NON AFRICAN AMERICAN: 8 mL/min — AB (ref >90)
GLUCOSE: 122 mg/dL — AB (ref 70–99)
PHOSPHORUS: 4.3 mg/dL (ref 2.3–4.6)
Potassium: 4.5 mmol/L (ref 3.5–5.1)
Sodium: 129 mmol/L — ABNORMAL LOW (ref 135–145)

## 2015-01-12 LAB — CBC
HCT: 28.2 % — ABNORMAL LOW (ref 39.0–52.0)
Hemoglobin: 9.2 g/dL — ABNORMAL LOW (ref 13.0–17.0)
MCH: 32.2 pg (ref 26.0–34.0)
MCHC: 32.6 g/dL (ref 30.0–36.0)
MCV: 98.6 fL (ref 78.0–100.0)
PLATELETS: 122 10*3/uL — AB (ref 150–400)
RBC: 2.86 MIL/uL — ABNORMAL LOW (ref 4.22–5.81)
RDW: 14.7 % (ref 11.5–15.5)
WBC: 7.8 10*3/uL (ref 4.0–10.5)

## 2015-01-12 LAB — GLUCOSE, CAPILLARY
Glucose-Capillary: 141 mg/dL — ABNORMAL HIGH (ref 70–99)
Glucose-Capillary: 214 mg/dL — ABNORMAL HIGH (ref 70–99)
Glucose-Capillary: 114 mg/dL — ABNORMAL HIGH (ref 70–99)

## 2015-01-12 LAB — LACTIC ACID, PLASMA: LACTIC ACID, VENOUS: 0.6 mmol/L (ref 0.5–2.0)

## 2015-01-12 MED ORDER — NEPRO/CARBSTEADY PO LIQD
237.0000 mL | Freq: Three times a day (TID) | ORAL | Status: DC
  Administered 2015-01-12 (×2): 237 mL via ORAL
  Filled 2015-01-12 (×11): qty 237

## 2015-01-12 NOTE — Progress Notes (Signed)
Pt transported to hemodialysis at 0711. Will assess and give meds upon return to unit.

## 2015-01-12 NOTE — Progress Notes (Addendum)
[facility] [place] speaking well, not oriented to place or time.   Objective 160/79 76 98.4 F (36.9 C) (Oral) 10 100%  Intake/Output Summary (Last 24 hours) at 01/12/15 0849 Last data filed at 01/12/15 0600  Gross per 24 hour  Intake    875 ml  Output      0 ml  Net    875 ml    Right BKA incision clean and dry Knee ext full actively    Assessment/Planning: POD # 3 BKA  Improving from Hypoglycemic event glucose 253 this am treated Currently on HD Will transfer to 2w  Plan D/C to skilled facility verses CIR in the next few days   Laurence Slate [facility] 01/12/2015 8:49 AM -- Agree with above.  BKA healing well Working on Disposition Hypoglycemia resolved  Ruta Hinds, MD [facility] [place]: [phone] Pager: [phone]  Laboratory Lab Results:  Recent Labs  01/11/15 0715 01/12/15 0751  WBC 8.1 7.8  HGB 11.2* 9.2*  HCT 34.9* 28.2*  PLT 108* 122*   BMET  Recent Labs  01/11/15 0403 01/11/15 0715  NA 139 139  K 3.8 4.4  CL 101 99  CO2 29 26  GLUCOSE <20* 253*  BUN 21 23  CREATININE 5.44* 5.57*  CALCIUM 8.4 8.2*    COAG Lab Results  Component Value Date   INR 1.08 01/07/2015   No results found for: PTT

## 2015-01-12 NOTE — Progress Notes (Signed)
Blood sugar checked. Result is 117.

## 2015-01-12 NOTE — Progress Notes (Addendum)
Patient fell out of bed around 10:45 on the left side.  I was is the room next door and heard the patient fall.  Upon assessment, patient's vital signs were stable (BP: 147/61, HR: 73, O2 SAT 95).  Before moving the patient back into the bed, patient was assessed for head and neck injuries. Pupils are equal and reactive, no signs of head injury at this time.  Patient is completely coherent at this time.  At 2000 assessment, patient was oriented to self but disoriented to timef, and situation.  Upon assessment immediately after the fall, patient was oriented to person (name, birthday) as well as place [facility]) but was still disoriented to situation.  Patient's CBG was 118 at 22:30.  Patient denies all pain.  When asked what happened, the patient said that he was reaching to throw something in the trash can.  We discussed the use of the call bell, and the patient vocalized understanding and said, "I wasn't trying to get up or nothing, I know to call". Elink was called and notified of the patient fall.

## 2015-01-12 NOTE — Progress Notes (Signed)
Subjective:   Still with confusion, 'I dont know exactly where I am"  Objective Filed Vitals:   01/12/15 0730 01/12/15 0745 01/12/15 0800 01/12/15 0900  BP: 174/78 171/78 160/79 163/79  Pulse: 79 76 76 78  Temp: 98.4 F (36.9 C)     TempSrc: Oral     Resp: 7 8 10 12   Height:      Weight: 55.3 kg (121 lb 14.6 oz)     SpO2: 100%      Physical Exam General: oriented to person. No acute distress.  Heart: RRR, 2/6 systolic  Lungs: CTA/unlabored  Abdomen: soft, nontender +BS Extremities: L BKA dressing dry and intact Dialysis Access:  R AVF patent on HD  Dialysis: TTS @ [facility] 3:30 56 kg 2K/2.25Ca 400/A1.5 No heparin AVF @ RUA  Calcitriol 0.5 mcg Mircera 50 mcg q2wk (last on 2/9) Venofer 50 mg on Thurs  Assessment: 1. Confusion / asterixis -  may be effect of severe hypoglycemia episodes. Stable otherwise. On IV D5W. Narcotics on hold. Stopping Lyrica today.  2. Hypoglycemia - on d5W 3. R foot gagnrene, s/p R BKA 2/24- will need lower edw 4. ESRD on HD - TTS. HD today 5. HTN/volume - cont lisinopril , needs new edw 6. Anemia - Hgb down to 9.2, Mircera last given 2/9, Fe qwk. 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 8. Nutrition - Alb 3.1, carb-mod diet. 9. DM Type 2.8 - with retinopathy & neuropathy, last A1C 5.7. 10. dispo-SNF vs CIR  Shelle Iron, NP [place] [phone] 01/12/2015,9:32 AM  LOS: 5 days   Pt seen, examined and agree w A/P as above. He is on a high dose of Lyrica for ESRD, will dc for now given AMS.  No other meds implicated.  Still somewhat confused.  HD already today went well.  Kelly Splinter MD pager [phone]    cell [phone] 01/12/2015, 3:29 PM    Additional Objective Labs: Basic Metabolic Panel:  Recent Labs Lab 01/08/15 1400  01/10/15 0421 01/11/15 0403 01/11/15 0715 01/12/15 0751  NA 135  < >  137 139 139 129*  K 5.5*  < > 4.8 3.8 4.4 4.5  CL 94*  < > 98 101 99 90*  CO2 26  < > 25 29 26 26   GLUCOSE 243*  < > 169* <20* 253* 122*  BUN 39*  < > 29* 21 23 36*  CREATININE 10.00*  < > 7.64* 5.44* 5.57* 6.83*  CALCIUM 7.6*  < > 8.0* 8.4 8.2* 8.1*  PHOS 9.3*  --  5.0*  --   --  4.3  < > = values in this interval not displayed. Liver Function Tests:  Recent Labs Lab 01/07/15 1915 01/08/15 1400 01/10/15 0421 01/12/15 0751  AST 27  --   --   --   ALT 16  --   --   --   ALKPHOS 77  --   --   --   BILITOT 0.7  --   --   --   PROT 6.9  --   --   --   ALBUMIN 3.3* 3.4* 3.1* 2.8*   No results for input(s): LIPASE, AMYLASE in the last 168 hours. CBC:  Recent Labs Lab 01/09/15 1500 01/10/15 0421 01/11/15 0403 01/11/15 0715 01/12/15 0751  WBC 8.5 8.3 8.3 8.1 7.8  NEUTROABS  --   --   --  6.5  --   HGB 8.2* 9.3* 10.4* 11.2* 9.2*  HCT 25.3* 29.3* 32.6* 34.9* 28.2*  MCV 100.4* 98.7 99.4 101.5* 98.6  PLT 111* 115* 105* 108* 122*   Blood Culture No results found for: SDES, SPECREQUEST, CULT, REPTSTATUS  Cardiac Enzymes: No results for input(s): CKTOTAL, CKMB, CKMBINDEX, TROPONINI in the last 168 hours. CBG:  Recent Labs Lab 01/11/15 1628 01/11/15 1712 01/11/15 1754 01/11/15 1908 01/11/15 2207  GLUCAP 64* 72 110* 152* 134*   Iron Studies: No results for input(s): IRON, TIBC, TRANSFERRIN, FERRITIN in the last 72 hours. @lablastinr3 @ Studies/Results: Dg Chest Port 1 View  01/11/2015   CLINICAL DATA:  Altered mental status  EXAM: PORTABLE CHEST - 1 VIEW  COMPARISON:  03/10/2011  FINDINGS: Cardiomegaly is noted. There is right upper lobe perihilar atelectasis or infiltrate. No pulmonary edema.  IMPRESSION: Cardiomegaly. Right upper lobe perihilar atelectasis or infiltrate. No pulmonary edema.   Electronically Signed   By: Lahoma Crocker M.D.   On: 01/11/2015 08:12   Medications: . dextrose 40 mL/hr at 01/11/15 1047   . amphetamine-dextroamphetamine  5 mg Oral Daily  .  aspirin EC  81 mg Oral Daily  . calcitRIOL  0.5 mcg Oral Daily  . carvedilol  6.25 mg Oral BID WC  . cefUROXime (ZINACEF)  IV  1.5 g Intravenous Once  . dextrose  25 mL Intravenous Once  . docusate sodium  100 mg Oral Daily  . famotidine  20 mg Oral Daily  . ferric gluconate (FERRLECIT/NULECIT) IV  62.5 mg Intravenous Weekly  . heparin  5,000 Units Subcutaneous 3 times per day  . insulin aspart  0-15 Units Subcutaneous TID WC  . lisinopril  20 mg Oral Daily  . metoCLOPramide  5 mg Oral TID AC & HS  . pregabalin  75 mg Oral TID  . sevelamer carbonate  3.2 g Oral TID WC  . simvastatin  40 mg Oral QHS

## 2015-01-13 LAB — GLUCOSE, CAPILLARY
GLUCOSE-CAPILLARY: 206 mg/dL — AB (ref 70–99)
GLUCOSE-CAPILLARY: 206 mg/dL — AB (ref 70–99)
Glucose-Capillary: 175 mg/dL — ABNORMAL HIGH (ref 70–99)

## 2015-01-13 MED ORDER — HEPARIN SODIUM (PORCINE) 1000 UNIT/ML DIALYSIS: 1000.0000 [IU] | INTRAMUSCULAR | Status: DC | PRN

## 2015-01-13 MED ORDER — PENTAFLUOROPROP-TETRAFLUOROETH EX AERO: 1.0000 "application " | INHALATION_SPRAY | CUTANEOUS | Status: DC | PRN

## 2015-01-13 MED ORDER — SODIUM CHLORIDE 0.9 % IV SOLN: 100.0000 mL | INTRAVENOUS | Status: DC | PRN

## 2015-01-13 MED ORDER — ALTEPLASE 2 MG IJ SOLR: 2.0000 mg | Freq: Once | INTRAMUSCULAR | Status: DC | PRN

## 2015-01-13 MED ORDER — LIDOCAINE-PRILOCAINE 2.5-2.5 % EX CREA
1.0000 "application " | TOPICAL_CREAM | CUTANEOUS | Status: DC | PRN
  Filled 2015-01-13: qty 5

## 2015-01-13 MED ORDER — NEPRO/CARBSTEADY PO LIQD
237.0000 mL | ORAL | Status: DC | PRN
  Filled 2015-01-13: qty 237

## 2015-01-13 MED ORDER — LIDOCAINE-PRILOCAINE 2.5-2.5 % EX CREA: 1.0000 "application " | TOPICAL_CREAM | CUTANEOUS | Status: DC | PRN

## 2015-01-13 MED ORDER — HEPARIN SODIUM (PORCINE) 1000 UNIT/ML DIALYSIS
1000.0000 [IU] | INTRAMUSCULAR | Status: DC | PRN
  Filled 2015-01-13: qty 1

## 2015-01-13 MED ORDER — LIDOCAINE HCL (PF) 1 % IJ SOLN: 5.0000 mL | INTRAMUSCULAR | Status: DC | PRN

## 2015-01-13 MED ORDER — SODIUM CHLORIDE 0.9 % IV SOLN
100.0000 mL | INTRAVENOUS | Status: DC | PRN
Start: 2015-01-13 — End: 2015-01-14

## 2015-01-13 MED ORDER — ALTEPLASE 2 MG IJ SOLR
2.0000 mg | Freq: Once | INTRAMUSCULAR | Status: AC | PRN
  Filled 2015-01-13: qty 2

## 2015-01-13 NOTE — Progress Notes (Signed)
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

## 2015-01-13 NOTE — Evaluation (Signed)
Physical Therapy Evaluation Patient Details Name: Garrett Salas MRN: [id number] DOB: 1950/12/17 Today's Date: 01/13/2015   History of Present Illness   64 y.o. (1951/09/21) male who presents with chief complaint: feet hurt. Onset of symptom occurred >2 years prior, intermittent claudication which is now rest pain. Pain is described as constant aching, severity 10/10, and associated with rest. Pt is unable to walk at this point. 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  Postop unresponsiveness, hypoglycemia, resolved now.   Clinical Impression  Pt presents with mild to moderate limitations to functional mobility related to right leg BKA.  Able to perform bed mobility, slide board transfer to/from [facility] and standing transfers with assistance.  Has slide board and w/c at home, family is willing and able to provide assist at d/c, and pt is able to function adequately for home d/c.  Recommend home with HHPT and plan eventual transition to OPPT once residual limb is ready for prosthetic training.  No further acute needs at this time.    Follow Up Recommendations Home health PT (plan to transition to OPPT for prosthetic training w/MD ok)    Equipment Recommendations  None recommended by PT    Recommendations for Other Services       Precautions / Restrictions Precautions Precautions: Fall Precaution Comments: new bka Restrictions Other Position/Activity Restrictions: avoid knee on pillow or prolonged hip/knee flexion      Mobility  Bed Mobility Overal bed mobility: Modified Independent             General bed mobility comments: uses rails; able to transition to supine, seated, prone unassisted with incr time  Transfers Overall transfer level: Needs assistance Equipment used: Rolling walker (2 wheeled) Transfers: Sit to/from Stand;Lateral/Scoot Transfers Sit to Stand: Min assist        Lateral/Scoot Transfers: With slide board;Supervision General transfer comment:  Performed SBT unassisted with min cues for uphill/back to bed.  pt required cues to wt shift to left/intact side and anticipate instability as COB is recalibrated.  sways when up, but able to find COB with min assist and hops fwd/backward 3 times with opportunity for increased stability in UE  Ambulation/Gait                Stairs            Wheelchair Mobility    Modified Rankin (Stroke Patients Only)       Balance Overall balance assessment: Needs assistance Sitting-balance support: No upper extremity supported;Feet unsupported Sitting balance-Leahy Scale: Fair Sitting balance - Comments: when challenged, pt has posterior LOB but able to quickly adapt and adjust   Standing balance support: Bilateral upper extremity supported;During functional activity Standing balance-Leahy Scale: Zero Standing balance comment: new BKA, able to stand 20 sec with RW, see sit>stand                             Pertinent Vitals/Pain Pain Assessment: No/denies pain    Home Living Family/patient expects to be discharged to:: Private residence Living Arrangements: Spouse/significant other;Children Available Help at Discharge: Family;Available PRN/intermittently;Personal care attendant Type of Home: Mobile home Home Access: Ramped entrance     Home Layout: One level Home Equipment: [place] - 2 wheels;Cane - single point;Shower seat;Bedside commode;Wheelchair - Press photographer      Prior Function Level of Independence: Independent with assistive device(s)         Comments: ambulated with a cane, assisted for household activities  by aide     Hand Dominance        Extremity/Trunk Assessment   Upper Extremity Assessment: Overall WFL for tasks assessed           Lower Extremity Assessment: RLE deficits/detail RLE Deficits / Details: BKA.  INtact ROM at knee and hip with risk for developing contractures -- educated and provided handout        Communication   Communication: No difficulties  Cognition Arousal/Alertness: Awake/alert Behavior During Therapy: WFL for tasks assessed/performed Overall Cognitive Status: Within Functional Limits for tasks assessed                      General Comments General comments (skin integrity, edema, etc.): dressing to residual limb intact and dry    Exercises Amputee Exercises Quad Sets: AROM;AAROM;Strengthening;Right;10 reps;Supine;Seated Hip Extension: AROM;Prone;Right;5 reps Hip ABduction/ADduction: AROM;Supine;5 reps Knee Extension: Seated;AROM;Both;10 reps Straight Leg Raises: AROM;Right;Supine      Assessment/Plan    PT Assessment All further PT needs can be met in the next venue of care  PT Diagnosis Abnormality of gait   PT Problem List Decreased strength;Decreased range of motion;Decreased balance;Decreased mobility;Decreased knowledge of use of DME  PT Treatment Interventions     PT Goals (Current goals can be found in the Care Plan section) Acute Rehab PT Goals Patient Stated Goal: go home PT Goal Formulation: All assessment and education complete, DC therapy    Frequency     Barriers to discharge        Co-evaluation               End of Session Equipment Utilized During Treatment: Gait belt Activity Tolerance: Patient tolerated treatment well Patient left: in bed;with call bell/phone within reach Nurse Communication: Mobility status;Precautions         Time: 1440-1530 PT Time Calculation (min) (ACUTE ONLY): 50 min   Charges:   PT Evaluation $Initial PT Evaluation Tier I: 1 Procedure PT Treatments $Therapeutic Exercise: 8-22 mins $Therapeutic Activity: 8-22 mins   PT G Codes:        Herbie Drape 01/13/2015, 3:51 PM

## 2015-01-13 NOTE — Progress Notes (Signed)
Garrett Salas begged me not to call and wake up his wife to tell her of his fall at the time.  I told him that I would wait until a time she would likely be awake (seeing as how he was giving consent for his own procedures and alert and oriented), but that I had to call and let her know.  I just called and spoke with [name] to inform her of her husband's fall.  She was relieved to know that he had no mental status changes over night, and was not injured in the fall.  She was thankful for the phone call. I verified that I had answered all her questions, and again before terminating the call asked if there she had any other concerns or questions.  She was very respective, responsive, and polite the entire conversation.

## 2015-01-13 NOTE — Progress Notes (Signed)
Order for SNF vs home with CAP program received.  Await PT/OT consults for next LOC recommendations.

## 2015-01-13 NOTE — Progress Notes (Signed)
Subjective:   MS much better  Objective Filed Vitals:   01/13/15 0700 01/13/15 0728 01/13/15 0800 01/13/15 0933  BP: 149/61  138/62 157/62  Pulse: 72  72 75  Temp:  98.9 F (37.2 C)  98.9 F (37.2 C)  TempSrc:  Oral  Oral  Resp: 12  12 18   Height:      Weight:      SpO2: 96%  98% 96%   Physical Exam General: oriented to place and person, hospital, town Heart: RRR, 2/6 systolic  Lungs: CTA/unlabored  Abdomen: soft, nontender +BS Extremities: L BKA dressing dry and intact Dialysis Access:  R AVF patent on HD  Dialysis: TTS @ [facility] 3:30 56 kg 2K/2.25Ca 400/A1.5 No heparin AVF @ [facility]  Calcitriol 0.5 mcg Mircera 50 mcg q2wk (last on 2/9) Venofer 50 mg on Thurs  Assessment: 1. AMS - due to medications / hypoglycemia. Much better. Would keep off of narcotics and Lyrica for now and at dc.   2. Hypoglycemia - resolved. Dc D5W 3. R foot gagnrene, s/p R BKA 2/24- will need lower edw 4. ESRD on HD TTS 5. HTN/volume - cont lisinopril and coreg, get vol down more w HD tonight 6. Anemia - Hgb down to 9.2, Mircera last given 2/9, Fe qwk. 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 8. Nutrition - Alb 3.1, carb-mod diet. 9. DM 2  10. Dispo per primary plan for possible dc Monday to SNF  Plan - extra HD tonight for volume, lower dry wt at DC.   Kelly Splinter MD pager [phone]    cell [phone] 01/13/2015, 9:53 AM    Additional Objective Labs: Basic Metabolic Panel:  Recent Labs Lab 01/08/15 1400  01/10/15 0421 01/11/15 0403 01/11/15 0715 01/12/15 0751  NA 135  < > 137 139 139 129*  K 5.5*  < > 4.8 3.8 4.4 4.5  CL 94*  < > 98 101 99 90*  CO2 26  < > 25 29 26 26   GLUCOSE 243*  < > 169* <20* 253* 122*  BUN 39*  < > 29* 21 23 36*  CREATININE 10.00*  < > 7.64* 5.44* 5.57* 6.83*  CALCIUM 7.6*  < > 8.0* 8.4 8.2* 8.1*  PHOS 9.3*  --  5.0*  --   --  4.3  < > =  values in this interval not displayed. Liver Function Tests:  Recent Labs Lab 01/07/15 1915 01/08/15 1400 01/10/15 0421 01/12/15 0751  AST 27  --   --   --   ALT 16  --   --   --   ALKPHOS 77  --   --   --   BILITOT 0.7  --   --   --   PROT 6.9  --   --   --   ALBUMIN 3.3* 3.4* 3.1* 2.8*   No results for input(s): LIPASE, AMYLASE in the last 168 hours. CBC:  Recent Labs Lab 01/09/15 1500 01/10/15 0421 01/11/15 0403 01/11/15 0715 01/12/15 0751  WBC 8.5 8.3 8.3 8.1 7.8  NEUTROABS  --   --   --  6.5  --   HGB 8.2* 9.3* 10.4* 11.2* 9.2*  HCT 25.3* 29.3* 32.6* 34.9* 28.2*  MCV 100.4* 98.7 99.4 101.5* 98.6  PLT 111* 115* 105* 108* 122*   Blood Culture No results found for: SDES, SPECREQUEST, CULT, REPTSTATUS  Cardiac Enzymes: No results for input(s):  CKTOTAL, CKMB, CKMBINDEX, TROPONINI in the last 168 hours. CBG:  Recent Labs Lab 01/11/15 2207 01/12/15 1234 01/12/15 1548 01/12/15 2228 01/13/15 0724  GLUCAP 134* 141* 214* 114* 175*   Iron Studies: No results for input(s): IRON, TIBC, TRANSFERRIN, FERRITIN in the last 72 hours. @lablastinr3 @ Studies/Results: No results found. Medications: . dextrose 40 mL/hr at 01/11/15 1047   . amphetamine-dextroamphetamine  5 mg Oral Daily  . aspirin EC  81 mg Oral Daily  . calcitRIOL  0.5 mcg Oral Daily  . carvedilol  6.25 mg Oral BID WC  . cefUROXime (ZINACEF)  IV  1.5 g Intravenous Once  . dextrose  25 mL Intravenous Once  . docusate sodium  100 mg Oral Daily  . famotidine  20 mg Oral Daily  . feeding supplement (NEPRO CARB STEADY)  237 mL Oral TID BM  . ferric gluconate (FERRLECIT/NULECIT) IV  62.5 mg Intravenous Weekly  . heparin  5,000 Units Subcutaneous 3 times per day  . insulin aspart  0-15 Units Subcutaneous TID WC  . lisinopril  20 mg Oral Daily  . metoCLOPramide  5 mg Oral TID AC & HS  . sevelamer carbonate  3.2 g Oral TID WC  . simvastatin  40 mg Oral QHS

## 2015-01-14 ENCOUNTER — Telehealth: Payer: Self-pay | Admitting: Vascular Surgery

## 2015-01-14 DIAGNOSIS — N186 End stage renal disease: Secondary | ICD-10-CM | POA: Diagnosis not present

## 2015-01-14 DIAGNOSIS — Z992 Dependence on renal dialysis: Secondary | ICD-10-CM | POA: Diagnosis not present

## 2015-01-14 LAB — GLUCOSE, CAPILLARY
GLUCOSE-CAPILLARY: 160 mg/dL — AB (ref 70–99)
GLUCOSE-CAPILLARY: 182 mg/dL — AB (ref 70–99)
Glucose-Capillary: 124 mg/dL — ABNORMAL HIGH (ref 70–99)
Glucose-Capillary: 126 mg/dL — ABNORMAL HIGH (ref 70–99)
Glucose-Capillary: 176 mg/dL — ABNORMAL HIGH (ref 70–99)

## 2015-01-14 MED ORDER — HYDROCODONE-ACETAMINOPHEN 10-325 MG PO TABS: 1.0000 | ORAL_TABLET | Freq: Four times a day (QID) | ORAL | Status: DC | PRN

## 2015-01-14 MED ORDER — RENA-VITE PO TABS
1.0000 | ORAL_TABLET | Freq: Every day | ORAL | Status: DC
  Filled 2015-01-14: qty 1

## 2015-01-14 MED ORDER — DARBEPOETIN ALFA 100 MCG/0.5ML IJ SOSY
PREFILLED_SYRINGE | INTRAMUSCULAR | Status: AC
  Administered 2015-01-14: 100 ug via INTRAVENOUS
  Filled 2015-01-14: qty 0.5

## 2015-01-14 MED ORDER — LISINOPRIL 20 MG PO TABS
20.0000 mg | ORAL_TABLET | Freq: Every day | ORAL | Status: DC
  Filled 2015-01-14: qty 1

## 2015-01-14 MED ORDER — DARBEPOETIN ALFA 100 MCG/0.5ML IJ SOSY
100.0000 ug | PREFILLED_SYRINGE | INTRAMUSCULAR | Status: DC
  Administered 2015-01-14: 100 ug via INTRAVENOUS

## 2015-01-14 NOTE — Telephone Encounter (Signed)
No answer, no vm- mailed letter to home address, dm

## 2015-01-14 NOTE — Progress Notes (Addendum)
Patient had an unassisted fall. Found on the floor by nursing staff with minimum bloody drainage from surgical stump. MD contacted. Per assessment patient did not hit his head. Post fall assessment was done per protocol.patient. Per patient request, family should not be contacted. MD made aware of this request. No new orders received. Will continue to monitor. Call bell within reach and bell alarm active.

## 2015-01-14 NOTE — Progress Notes (Signed)
Rehab admissions - I am following pt's case and made note of PT's recommendation for home with home health. I met with pt today and he was dressed and ready to go home. Pt had no further questions about his upcoming discharge.  I spoke with [place], Education officer, museum about pt's case and Kristi, Tourist information centre manager.  I will now sign off pt's case. We recommend that home with home health be pursued.  Thanks.  Nanetta Batty, PT Rehabilitation Admissions Coordinator [phone]

## 2015-01-14 NOTE — Telephone Encounter (Signed)
-----   Message from Mena Goes, RN sent at 01/14/2015  1:47 PM EST ----- Regarding: Schedule   ----- Message -----    From: Alvia Grove, PA-C    Sent: 01/14/2015  12:43 PM      To: Vvs Charge Pool  S/p R BKA 01/09/15  F/u with Dr Bridgett Larsson in 4 weeks  Thanks Maudie Mercury

## 2015-01-14 NOTE — Procedures (Signed)
I was present at this session.  I have reviewed the session itself and made appropriate changes.  BP ^, lower vol more, adjust meds . Access press ok.     Quenten Nawaz L 2/29/20168:26 AM

## 2015-01-14 NOTE — Progress Notes (Addendum)
  [facility] Progress Note  01/14/2015 12:21 PM 5 Days Post-Op  Subjective:  Wants to go home.   Filed Vitals:   01/14/15 1012  BP: 162/72  Pulse: 80  Temp: 98.8 F (37.1 C)  Resp: 16    Physical Exam: AxO x 3 Incisions: Right below knee incision healing well.   CBC    Component Value Date/Time   WBC 7.8 01/12/2015 0751   RBC 2.86* 01/12/2015 0751   HGB 9.2* 01/12/2015 0751   HCT 28.2* 01/12/2015 0751   PLT 122* 01/12/2015 0751   MCV 98.6 01/12/2015 0751   MCH 32.2 01/12/2015 0751   MCHC 32.6 01/12/2015 0751   RDW 14.7 01/12/2015 0751   LYMPHSABS 0.7 01/11/2015 0715   MONOABS 0.8 01/11/2015 0715   EOSABS 0.0 01/11/2015 0715   BASOSABS 0.0 01/11/2015 0715    BMET    Component Value Date/Time   NA 129* 01/12/2015 0751   K 4.5 01/12/2015 0751   CL 90* 01/12/2015 0751   CO2 26 01/12/2015 0751   GLUCOSE 122* 01/12/2015 0751   BUN 36* 01/12/2015 0751   CREATININE 6.83* 01/12/2015 0751   CALCIUM 8.1* 01/12/2015 0751   GFRNONAA 8* 01/12/2015 0751   GFRAA 9* 01/12/2015 0751    INR    Component Value Date/Time   INR 1.08 01/07/2015 1915     Intake/Output Summary (Last 24 hours) at 01/14/15 1221 Last data filed at 01/14/15 1012  Gross per 24 hour  Intake    360 ml  Output   4000 ml  Net  -3640 ml     Assessment:  64 y.o. male is s/p:  Right BKA 5 Days Post-Op  Plan: -At his baseline mental status. No further hypoglycemia issues. -PT recommending home with home health PT.  -Discharge home today. Follow up with Dr. Bridgett Larsson in 4 weeks.     Virgina Jock, PA-C [facility] Office: [phone] Pager: [phone] 01/14/2015 12:21 PM  Addendum  I have independently interviewed and examined the patient, and I agree with the physician assistant's findings.  L BKA intact despite fall last night.  Staples out in 4 weeks.  Adele Barthel, MD [facility] [place] Office: [phone] Pager:  [phone]  01/14/2015, 1:17 PM

## 2015-01-14 NOTE — Clinical Social Work Note (Signed)
CSW spoke with patient at bedside- patient would like to go home and thinks he is safe to go home with his wife and son helping him- pt also has CAP services which provide a home health aid.  CSW informed RNCM of pt wish for home health services.  CSW signing off.  Domenica Reamer, [place] Social Worker [phone]

## 2015-01-14 NOTE — Progress Notes (Signed)
Subjective:   Seen on dialysis, no complaints, comfortable  Objective: Vital signs in last 24 hours: Temp:  [97.3 F (36.3 C)-99.1 F (37.3 C)] 97.3 F (36.3 C) (02/29 0650) Pulse Rate:  [75-87] 80 (02/29 0654) Resp:  [12-18] 12 (02/29 0654) BP: (145-219)/(62-90) 171/78 mmHg (02/29 0654) SpO2:  [94 %-100 %] 98 % (02/29 0650) Weight:  [51.8 kg (114 lb 3.2 oz)] 51.8 kg (114 lb 3.2 oz) (02/29 0650) Weight change: -3.5 kg (-7 lb 11.5 oz)  Intake/Output from previous day: 02/28 0701 - 02/29 0700 In: 400 [P.O.:360; I.V.:40] Out: 0  Intake/Output this shift:   Lab Results:  Recent Labs  01/12/15 0751  WBC 7.8  HGB 9.2*  HCT 28.2*  PLT 122*   BMET:  Recent Labs  01/12/15 0751  NA 129*  K 4.5  CL 90*  CO2 26  GLUCOSE 122*  BUN 36*  CREATININE 6.83*  CALCIUM 8.1*  ALBUMIN 2.8*   No results for input(s): PTH in the last 72 hours. Iron Studies: No results for input(s): IRON, TIBC, TRANSFERRIN, FERRITIN in the last 72 hours.  Studies/Results: No results found.   EXAM: General appearance: Alert, in no apparent distress somewhat somnolent Resp: CTA without rales, rhonchi, or wheezes decreased bs Cardio: RRR with Gr II/VI systolic murmur, no rub LV lift GI: + BS, soft and nontender liver down 4 cm Extremities: Salas BKA wrapped, no R LE edema Access: AVF @ [facility] with BFR 400  Dialysis: TTS @ [facility] 3:30 56 kg 2K/2.25Ca 400/A1.5 No heparin AVF @ [facility]  Calcitriol 0.5 mcg Mircera 50 mcg q2wk (last on 2/9) Venofer 50 mg on Thurs  Assessment/Plan: 1. AMS - sec to meds, hypoglycemia, improved; avoid narcotics and Lyrica. 2. Bilateral LE ischemia with R foot gangrene - s/p R BKA per Dr. Bridgett Larsson 2/24, stable. 3. ESRD - HD on TTS @ [facility], K 4.5. HD today. Lower vol 4. HTN/volume - BP 171/78, Carvedilol 6.25 mg bid, Lisinopril 20 mg qd on hold; wt 51.8 with UF goal 3 Salas, establish lower EDW. Change meds to hs to be predictable effect on  bp 5. Anemia - Hgb down to 9.2, Mircera last given 2/9, Fe qwk. Aranesp 100 mcg qwk. 6. Sec HPT - Ca 8.1 (9.1 corrected), P 4.3, iPTH 323; Calcitriol 0.5 mcg, Renvela with meals. 7. Nutrition - Alb 2.8, carb-mod diet. 8. 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    LOS: 7 days   Salas,Garrett 01/14/2015,8:05 AM I have seen and examined this patient and agree with the plan of care seen , eval, examined, discussed and changes made.   .  Garrett Salas 01/14/2015, 8:29 AM

## 2015-01-15 DIAGNOSIS — N186 End stage renal disease: Secondary | ICD-10-CM | POA: Diagnosis not present

## 2015-01-15 DIAGNOSIS — E1129 Type 2 diabetes mellitus with other diabetic kidney complication: Secondary | ICD-10-CM | POA: Diagnosis not present

## 2015-01-15 DIAGNOSIS — N2581 Secondary hyperparathyroidism of renal origin: Secondary | ICD-10-CM | POA: Diagnosis not present

## 2015-01-15 DIAGNOSIS — D631 Anemia in chronic kidney disease: Secondary | ICD-10-CM | POA: Diagnosis not present

## 2015-01-15 DIAGNOSIS — D509 Iron deficiency anemia, unspecified: Secondary | ICD-10-CM | POA: Diagnosis not present

## 2015-01-15 LAB — GLUCOSE, CAPILLARY: GLUCOSE-CAPILLARY: 117 mg/dL — AB (ref 70–99)

## 2015-01-15 NOTE — Discharge Summary (Signed)
[facility] Discharge Summary  Garrett Salas 21-Apr-1951 64 y.o. male  [id number]  Admission Date: 01/07/2015  Discharge Date: 01/14/2015  Physician: Adele Barthel, MD  Admission Diagnosis: critical limb ascemia gangren right foot bilaterial rest pain Peripheral vascular disease I70.223; Right foot gangrene I96   HPI:   This is a 64 y.o. male who presented with chief complaint: feet hurt. Onset of symptom occurred >2 years prior, intermittent claudication which is now rest pain. Pain is described as constant aching, severity 10/10, and associated with rest. Pt is unable to walk at this point. Patient has attempted to treat this pain with rest and OTC medications. He has come in outside ED for pain mgmt. The patient has rest pain symptoms also and ischemic toes. From the chart, their documented evidence as early as last June. Atherosclerotic risk factors include: DM, HLD, ESRD, HTN, and Smoking.  Hospital Course:  The patient was admitted to the hospital and taken to the operating room on 01/07/2015 and underwent abdominal aortogram with bilateral runoff. Based on his images, he was a possible candidate for right femoral to posterior tibial artery bypass, right first and second toe amputation versus below-knee amputation. He was seen by cardiology for preoperative evaluation. Vein mapping was ordered. If he had no acceptable vein conduit, right below knee amputation would be warranted. On 01/09/2015, he underwent right below-knee amputation.   The patient tolerated the procedure well and was transported to the PACU in good condition. Nephrology was consulted for dialysis.  On POD 1, he was doing well with his dressing clean and in place. On POD 2, he was found unresponsive with delirium and was transferred to the ICU. This was believed to be due to narcotics and hypoglycemia. He was placed on maintenance fluids and all narcotics were held. His below knee stump was  viable. On POD 3, he was improving from his hypoglycemic event. He was alert but not oriented to time or place. He was transferred back to the floor. He sustained a fall out of bed that night onto his left side. His vitals were stable and he did not sustain any injuries. He was oriented to person and place but not situation.   On POD 4, he was back to his baseline mental status.   Early on POD 5, the patient had an unassisted fall on his stump. There was minimal bloody drainage from the stump. His incision was clean and intact despite fall. He was oriented to person, place, time and situation. He was recommended to go home with home health PT. His hypoglycemia issues had resolved. He was discharged home on POD 5 in good condition. He will follow up in 4 weeks for staple removal.    CBC    Component Value Date/Time   WBC 7.8 01/12/2015 0751   RBC 2.86* 01/12/2015 0751   HGB 9.2* 01/12/2015 0751   HCT 28.2* 01/12/2015 0751   PLT 122* 01/12/2015 0751   MCV 98.6 01/12/2015 0751   MCH 32.2 01/12/2015 0751   MCHC 32.6 01/12/2015 0751   RDW 14.7 01/12/2015 0751   LYMPHSABS 0.7 01/11/2015 0715   MONOABS 0.8 01/11/2015 0715   EOSABS 0.0 01/11/2015 0715   BASOSABS 0.0 01/11/2015 0715    BMET    Component Value Date/Time   NA 129* 01/12/2015 0751   K 4.5 01/12/2015 0751   CL 90* 01/12/2015 0751   CO2 26 01/12/2015 0751   GLUCOSE 122* 01/12/2015 0751   BUN 36* 01/12/2015 0751  CREATININE 6.83* 01/12/2015 0751   CALCIUM 8.1* 01/12/2015 0751   GFRNONAA 8* 01/12/2015 0751   GFRAA 9* 01/12/2015 0751     Discharge Instructions:   The patient is discharged to home with extensive instructions on wound care and progressive ambulation.  They are instructed not to drive or perform any heavy lifting until returning to see the physician in his office.  Discharge Instructions    Call MD for:  redness, tenderness, or signs of infection (pain, swelling, bleeding, redness, odor or green/yellow  discharge around incision site)    Complete by:  As directed      Call MD for:  severe or increased pain, loss or decreased feeling  in affected limb(s)    Complete by:  As directed      Call MD for:  temperature >100.5    Complete by:  As directed      Discharge wound care:    Complete by:  As directed   Wash wound daily with soap and water and pat dry. Apply gauze and wrap with kerlix.     Driving Restrictions    Complete by:  As directed   No driving for 2 weeks     Lifting restrictions    Complete by:  As directed   No lifting for 2 weeks     Resume previous diet    Complete by:  As directed            Discharge Diagnosis:  critical limb ascemia gangren right foot bilaterial rest pain Peripheral vascular disease I70.223; Right foot gangrene I96  Secondary Diagnosis: Patient Active Problem List   Diagnosis Date Noted  . PAD (peripheral artery disease) 01/07/2015  . ESRD on dialysis 01/04/2015  . HLD (hyperlipidemia) 01/04/2015  . DM (diabetes mellitus) type II controlled peripheral vascular disorder 01/04/2015  . Atherosclerosis of native arteries of the extremities with gangrene 01/04/2015  . Dyspnea 05/18/2011  . Hypertension   . Tobacco abuse    Past Medical History  Diagnosis Date  . Diabetes mellitus     Type 2. Diagnosed in mid 33s. Currently insulin requiring  . Hyperlipidemia   . ESRD (end stage renal disease) on dialysis     due to diabetic nephropathy. T/Th/Sat  . Tobacco abuse   . Diabetic neuropathy   . Diabetic retinopathy     legally blind  . Hypertension   . Dialysis patient        Medication List    STOP taking these medications        cephALEXin 500 MG capsule  Commonly known as:  KEFLEX     ciprofloxacin 250 MG tablet  Commonly known as:  CIPRO      TAKE these medications        albuterol 108 (90 BASE) MCG/ACT inhaler  Commonly known as:  PROVENTIL HFA;VENTOLIN HFA  Inhale 2 puffs into the lungs every 6 (six) hours as needed  for wheezing or shortness of breath.     amphetamine-dextroamphetamine 5 MG tablet  Commonly known as:  ADDERALL  Take 5 mg by mouth daily.     aspirin EC 81 MG tablet  Take 81 mg by mouth daily.     HUMULIN 70/30 KWIKPEN (70-30) 100 UNIT/ML PEN  Generic drug:  Insulin Isophane & Regular Human  Inject 5-20 Units into the skin 2 (two) times daily as needed (CBG over 175).     HYDROcodone-acetaminophen 10-325 MG per tablet  Commonly known as:  NORCO  Take  1 tablet by mouth every 6 (six) hours as needed.     insulin glargine 100 unit/mL Sopn  Commonly known as:  LANTUS  Inject 25 Units into the skin at bedtime.     lisinopril 20 MG tablet  Commonly known as:  PRINIVIL,ZESTRIL  Take 20 mg by mouth daily.     metoCLOPramide 5 MG tablet  Commonly known as:  REGLAN  Take 5 mg by mouth 4 (four) times daily -  before meals and at bedtime.     oxyCODONE 5 MG immediate release tablet  Commonly known as:  Oxy IR/ROXICODONE  Take 1 tablet (5 mg total) by mouth every 8 (eight) hours as needed for severe pain.     pregabalin 75 MG capsule  Commonly known as:  LYRICA  Take 75 mg by mouth 3 (three) times daily.     ranitidine 150 MG tablet  Commonly known as:  ZANTAC  Take 150 mg by mouth daily.     sevelamer carbonate 800 MG tablet  Commonly known as:  RENVELA  Take 3,200 mg by mouth 3 (three) times daily with meals.     silver sulfADIAZINE 1 % cream  Commonly known as:  SILVADENE  Apply 1 application topically daily.     simvastatin 40 MG tablet  Commonly known as:  ZOCOR  Take 40 mg by mouth daily.        Norco #30 No Refill  Disposition: Home   Patient's condition: is Good  Follow up: 1. Dr. Bridgett Larsson in 4 weeks   Virgina Jock, PA-C [facility] [phone] 01/15/2015  9:46 AM   Addendum  I have independently interviewed and examined the patient, and I agree with the physician assistant's findings.  This patient underwent an aortogram with  bilateral leg runoff for right foot gangrene.  Post-operatively, he was admitted for expedited work up including cardiac clearance.  His angiogram demonstrated that he might be a candidate for a right femoral-PT bypass with toe amputations; however, the patient elected to proceed with R BKA.   His BKA was uneventful but his post-operative course was eventful for hypoglycemic event resulting in altered mental status.  Eventually both the hypoglycemia and mental status changes resolved.  The patient was found to be a candidate for home PT/OT.  He was discharge to home with service with scheduled follow up in 4 weeks for staple removal in the R BKA stump.  Adele Barthel, MD [facility] [place] Office: [phone] Pager: [phone]  01/15/2015, 2:35 PM

## 2015-01-16 DIAGNOSIS — Z794 Long term (current) use of insulin: Secondary | ICD-10-CM | POA: Diagnosis not present

## 2015-01-16 DIAGNOSIS — Z89511 Acquired absence of right leg below knee: Secondary | ICD-10-CM | POA: Diagnosis not present

## 2015-01-16 DIAGNOSIS — I120 Hypertensive chronic kidney disease with stage 5 chronic kidney disease or end stage renal disease: Secondary | ICD-10-CM | POA: Diagnosis not present

## 2015-01-16 DIAGNOSIS — E119 Type 2 diabetes mellitus without complications: Secondary | ICD-10-CM | POA: Diagnosis not present

## 2015-01-16 DIAGNOSIS — Z4781 Encounter for orthopedic aftercare following surgical amputation: Secondary | ICD-10-CM | POA: Diagnosis not present

## 2015-01-16 DIAGNOSIS — N186 End stage renal disease: Secondary | ICD-10-CM | POA: Diagnosis not present

## 2015-01-17 DIAGNOSIS — N186 End stage renal disease: Secondary | ICD-10-CM | POA: Diagnosis not present

## 2015-01-17 DIAGNOSIS — D631 Anemia in chronic kidney disease: Secondary | ICD-10-CM | POA: Diagnosis not present

## 2015-01-17 DIAGNOSIS — N2581 Secondary hyperparathyroidism of renal origin: Secondary | ICD-10-CM | POA: Diagnosis not present

## 2015-01-17 DIAGNOSIS — E1129 Type 2 diabetes mellitus with other diabetic kidney complication: Secondary | ICD-10-CM | POA: Diagnosis not present

## 2015-01-17 DIAGNOSIS — D509 Iron deficiency anemia, unspecified: Secondary | ICD-10-CM | POA: Diagnosis not present

## 2015-01-17 NOTE — Progress Notes (Signed)
Spoke with PA Virgina Jock about prescription for Norco left from discharge.  Pt/family called 01/14/15 and someone was going to pick up. I attempted to call today 01/17/15 and was not able to get an answer or leave a message.  Per PA we will discard current script and office will be notified if needed to re-issue to pt. Payton Emerald, RN

## 2015-01-19 DIAGNOSIS — N186 End stage renal disease: Secondary | ICD-10-CM | POA: Diagnosis not present

## 2015-01-19 DIAGNOSIS — D631 Anemia in chronic kidney disease: Secondary | ICD-10-CM | POA: Diagnosis not present

## 2015-01-19 DIAGNOSIS — E1129 Type 2 diabetes mellitus with other diabetic kidney complication: Secondary | ICD-10-CM | POA: Diagnosis not present

## 2015-01-19 DIAGNOSIS — N2581 Secondary hyperparathyroidism of renal origin: Secondary | ICD-10-CM | POA: Diagnosis not present

## 2015-01-19 DIAGNOSIS — D509 Iron deficiency anemia, unspecified: Secondary | ICD-10-CM | POA: Diagnosis not present

## 2015-01-21 DIAGNOSIS — G894 Chronic pain syndrome: Secondary | ICD-10-CM | POA: Diagnosis not present

## 2015-01-21 DIAGNOSIS — G629 Polyneuropathy, unspecified: Secondary | ICD-10-CM | POA: Diagnosis not present

## 2015-01-21 DIAGNOSIS — Z681 Body mass index (BMI) 19 or less, adult: Secondary | ICD-10-CM | POA: Diagnosis not present

## 2015-01-21 DIAGNOSIS — I739 Peripheral vascular disease, unspecified: Secondary | ICD-10-CM | POA: Diagnosis not present

## 2015-01-21 DIAGNOSIS — E1129 Type 2 diabetes mellitus with other diabetic kidney complication: Secondary | ICD-10-CM | POA: Diagnosis not present

## 2015-01-21 DIAGNOSIS — Z89521 Acquired absence of right knee: Secondary | ICD-10-CM | POA: Diagnosis not present

## 2015-01-22 DIAGNOSIS — N2581 Secondary hyperparathyroidism of renal origin: Secondary | ICD-10-CM | POA: Diagnosis not present

## 2015-01-22 DIAGNOSIS — D509 Iron deficiency anemia, unspecified: Secondary | ICD-10-CM | POA: Diagnosis not present

## 2015-01-22 DIAGNOSIS — E1129 Type 2 diabetes mellitus with other diabetic kidney complication: Secondary | ICD-10-CM | POA: Diagnosis not present

## 2015-01-22 DIAGNOSIS — N186 End stage renal disease: Secondary | ICD-10-CM | POA: Diagnosis not present

## 2015-01-22 DIAGNOSIS — D631 Anemia in chronic kidney disease: Secondary | ICD-10-CM | POA: Diagnosis not present

## 2015-01-23 DIAGNOSIS — Z794 Long term (current) use of insulin: Secondary | ICD-10-CM | POA: Diagnosis not present

## 2015-01-23 DIAGNOSIS — E119 Type 2 diabetes mellitus without complications: Secondary | ICD-10-CM | POA: Diagnosis not present

## 2015-01-23 DIAGNOSIS — Z89511 Acquired absence of right leg below knee: Secondary | ICD-10-CM | POA: Diagnosis not present

## 2015-01-23 DIAGNOSIS — Z4781 Encounter for orthopedic aftercare following surgical amputation: Secondary | ICD-10-CM | POA: Diagnosis not present

## 2015-01-23 DIAGNOSIS — I120 Hypertensive chronic kidney disease with stage 5 chronic kidney disease or end stage renal disease: Secondary | ICD-10-CM | POA: Diagnosis not present

## 2015-01-23 DIAGNOSIS — N186 End stage renal disease: Secondary | ICD-10-CM | POA: Diagnosis not present

## 2015-01-24 DIAGNOSIS — N186 End stage renal disease: Secondary | ICD-10-CM | POA: Diagnosis not present

## 2015-01-24 DIAGNOSIS — N2581 Secondary hyperparathyroidism of renal origin: Secondary | ICD-10-CM | POA: Diagnosis not present

## 2015-01-24 DIAGNOSIS — E1129 Type 2 diabetes mellitus with other diabetic kidney complication: Secondary | ICD-10-CM | POA: Diagnosis not present

## 2015-01-24 DIAGNOSIS — D631 Anemia in chronic kidney disease: Secondary | ICD-10-CM | POA: Diagnosis not present

## 2015-01-24 DIAGNOSIS — D509 Iron deficiency anemia, unspecified: Secondary | ICD-10-CM | POA: Diagnosis not present

## 2015-01-25 DIAGNOSIS — Z4781 Encounter for orthopedic aftercare following surgical amputation: Secondary | ICD-10-CM | POA: Diagnosis not present

## 2015-01-25 DIAGNOSIS — Z89511 Acquired absence of right leg below knee: Secondary | ICD-10-CM | POA: Diagnosis not present

## 2015-01-25 DIAGNOSIS — N186 End stage renal disease: Secondary | ICD-10-CM | POA: Diagnosis not present

## 2015-01-25 DIAGNOSIS — E119 Type 2 diabetes mellitus without complications: Secondary | ICD-10-CM | POA: Diagnosis not present

## 2015-01-25 DIAGNOSIS — Z794 Long term (current) use of insulin: Secondary | ICD-10-CM | POA: Diagnosis not present

## 2015-01-25 DIAGNOSIS — I120 Hypertensive chronic kidney disease with stage 5 chronic kidney disease or end stage renal disease: Secondary | ICD-10-CM | POA: Diagnosis not present

## 2015-01-26 DIAGNOSIS — N186 End stage renal disease: Secondary | ICD-10-CM | POA: Diagnosis not present

## 2015-01-26 DIAGNOSIS — D631 Anemia in chronic kidney disease: Secondary | ICD-10-CM | POA: Diagnosis not present

## 2015-01-26 DIAGNOSIS — E1129 Type 2 diabetes mellitus with other diabetic kidney complication: Secondary | ICD-10-CM | POA: Diagnosis not present

## 2015-01-26 DIAGNOSIS — N2581 Secondary hyperparathyroidism of renal origin: Secondary | ICD-10-CM | POA: Diagnosis not present

## 2015-01-26 DIAGNOSIS — D509 Iron deficiency anemia, unspecified: Secondary | ICD-10-CM | POA: Diagnosis not present

## 2015-01-28 DIAGNOSIS — Z89511 Acquired absence of right leg below knee: Secondary | ICD-10-CM | POA: Diagnosis not present

## 2015-01-28 DIAGNOSIS — Z794 Long term (current) use of insulin: Secondary | ICD-10-CM | POA: Diagnosis not present

## 2015-01-28 DIAGNOSIS — Z4781 Encounter for orthopedic aftercare following surgical amputation: Secondary | ICD-10-CM | POA: Diagnosis not present

## 2015-01-28 DIAGNOSIS — E119 Type 2 diabetes mellitus without complications: Secondary | ICD-10-CM | POA: Diagnosis not present

## 2015-01-28 DIAGNOSIS — N186 End stage renal disease: Secondary | ICD-10-CM | POA: Diagnosis not present

## 2015-01-28 DIAGNOSIS — I120 Hypertensive chronic kidney disease with stage 5 chronic kidney disease or end stage renal disease: Secondary | ICD-10-CM | POA: Diagnosis not present

## 2015-01-29 DIAGNOSIS — D631 Anemia in chronic kidney disease: Secondary | ICD-10-CM | POA: Diagnosis not present

## 2015-01-29 DIAGNOSIS — E1129 Type 2 diabetes mellitus with other diabetic kidney complication: Secondary | ICD-10-CM | POA: Diagnosis not present

## 2015-01-29 DIAGNOSIS — N2581 Secondary hyperparathyroidism of renal origin: Secondary | ICD-10-CM | POA: Diagnosis not present

## 2015-01-29 DIAGNOSIS — D509 Iron deficiency anemia, unspecified: Secondary | ICD-10-CM | POA: Diagnosis not present

## 2015-01-29 DIAGNOSIS — N186 End stage renal disease: Secondary | ICD-10-CM | POA: Diagnosis not present

## 2015-01-30 DIAGNOSIS — Z794 Long term (current) use of insulin: Secondary | ICD-10-CM | POA: Diagnosis not present

## 2015-01-30 DIAGNOSIS — I120 Hypertensive chronic kidney disease with stage 5 chronic kidney disease or end stage renal disease: Secondary | ICD-10-CM | POA: Diagnosis not present

## 2015-01-30 DIAGNOSIS — E119 Type 2 diabetes mellitus without complications: Secondary | ICD-10-CM | POA: Diagnosis not present

## 2015-01-30 DIAGNOSIS — N186 End stage renal disease: Secondary | ICD-10-CM | POA: Diagnosis not present

## 2015-01-30 DIAGNOSIS — Z89511 Acquired absence of right leg below knee: Secondary | ICD-10-CM | POA: Diagnosis not present

## 2015-01-30 DIAGNOSIS — Z4781 Encounter for orthopedic aftercare following surgical amputation: Secondary | ICD-10-CM | POA: Diagnosis not present

## 2015-01-31 DIAGNOSIS — D631 Anemia in chronic kidney disease: Secondary | ICD-10-CM | POA: Diagnosis not present

## 2015-01-31 DIAGNOSIS — N186 End stage renal disease: Secondary | ICD-10-CM | POA: Diagnosis not present

## 2015-01-31 DIAGNOSIS — D509 Iron deficiency anemia, unspecified: Secondary | ICD-10-CM | POA: Diagnosis not present

## 2015-01-31 DIAGNOSIS — E1129 Type 2 diabetes mellitus with other diabetic kidney complication: Secondary | ICD-10-CM | POA: Diagnosis not present

## 2015-01-31 DIAGNOSIS — N2581 Secondary hyperparathyroidism of renal origin: Secondary | ICD-10-CM | POA: Diagnosis not present

## 2015-02-01 DIAGNOSIS — Z89511 Acquired absence of right leg below knee: Secondary | ICD-10-CM | POA: Diagnosis not present

## 2015-02-01 DIAGNOSIS — Z4781 Encounter for orthopedic aftercare following surgical amputation: Secondary | ICD-10-CM | POA: Diagnosis not present

## 2015-02-01 DIAGNOSIS — E119 Type 2 diabetes mellitus without complications: Secondary | ICD-10-CM | POA: Diagnosis not present

## 2015-02-01 DIAGNOSIS — N186 End stage renal disease: Secondary | ICD-10-CM | POA: Diagnosis not present

## 2015-02-01 DIAGNOSIS — I120 Hypertensive chronic kidney disease with stage 5 chronic kidney disease or end stage renal disease: Secondary | ICD-10-CM | POA: Diagnosis not present

## 2015-02-01 DIAGNOSIS — Z794 Long term (current) use of insulin: Secondary | ICD-10-CM | POA: Diagnosis not present

## 2015-02-02 DIAGNOSIS — N2581 Secondary hyperparathyroidism of renal origin: Secondary | ICD-10-CM | POA: Diagnosis not present

## 2015-02-02 DIAGNOSIS — D509 Iron deficiency anemia, unspecified: Secondary | ICD-10-CM | POA: Diagnosis not present

## 2015-02-02 DIAGNOSIS — D631 Anemia in chronic kidney disease: Secondary | ICD-10-CM | POA: Diagnosis not present

## 2015-02-02 DIAGNOSIS — N186 End stage renal disease: Secondary | ICD-10-CM | POA: Diagnosis not present

## 2015-02-02 DIAGNOSIS — E1129 Type 2 diabetes mellitus with other diabetic kidney complication: Secondary | ICD-10-CM | POA: Diagnosis not present

## 2015-02-04 DIAGNOSIS — Z794 Long term (current) use of insulin: Secondary | ICD-10-CM | POA: Diagnosis not present

## 2015-02-04 DIAGNOSIS — Z4781 Encounter for orthopedic aftercare following surgical amputation: Secondary | ICD-10-CM | POA: Diagnosis not present

## 2015-02-04 DIAGNOSIS — I120 Hypertensive chronic kidney disease with stage 5 chronic kidney disease or end stage renal disease: Secondary | ICD-10-CM | POA: Diagnosis not present

## 2015-02-04 DIAGNOSIS — E119 Type 2 diabetes mellitus without complications: Secondary | ICD-10-CM | POA: Diagnosis not present

## 2015-02-04 DIAGNOSIS — N186 End stage renal disease: Secondary | ICD-10-CM | POA: Diagnosis not present

## 2015-02-04 DIAGNOSIS — Z89511 Acquired absence of right leg below knee: Secondary | ICD-10-CM | POA: Diagnosis not present

## 2015-02-05 DIAGNOSIS — E1129 Type 2 diabetes mellitus with other diabetic kidney complication: Secondary | ICD-10-CM | POA: Diagnosis not present

## 2015-02-05 DIAGNOSIS — N2581 Secondary hyperparathyroidism of renal origin: Secondary | ICD-10-CM | POA: Diagnosis not present

## 2015-02-05 DIAGNOSIS — N186 End stage renal disease: Secondary | ICD-10-CM | POA: Diagnosis not present

## 2015-02-05 DIAGNOSIS — D509 Iron deficiency anemia, unspecified: Secondary | ICD-10-CM | POA: Diagnosis not present

## 2015-02-05 DIAGNOSIS — D631 Anemia in chronic kidney disease: Secondary | ICD-10-CM | POA: Diagnosis not present

## 2015-02-06 ENCOUNTER — Encounter: Payer: Self-pay | Admitting: Vascular Surgery

## 2015-02-06 DIAGNOSIS — Z89511 Acquired absence of right leg below knee: Secondary | ICD-10-CM | POA: Diagnosis not present

## 2015-02-06 DIAGNOSIS — Z794 Long term (current) use of insulin: Secondary | ICD-10-CM | POA: Diagnosis not present

## 2015-02-06 DIAGNOSIS — N186 End stage renal disease: Secondary | ICD-10-CM | POA: Diagnosis not present

## 2015-02-06 DIAGNOSIS — E119 Type 2 diabetes mellitus without complications: Secondary | ICD-10-CM | POA: Diagnosis not present

## 2015-02-06 DIAGNOSIS — Z4781 Encounter for orthopedic aftercare following surgical amputation: Secondary | ICD-10-CM | POA: Diagnosis not present

## 2015-02-06 DIAGNOSIS — I120 Hypertensive chronic kidney disease with stage 5 chronic kidney disease or end stage renal disease: Secondary | ICD-10-CM | POA: Diagnosis not present

## 2015-02-07 ENCOUNTER — Encounter: Payer: Medicare Other | Admitting: Vascular Surgery

## 2015-02-07 ENCOUNTER — Ambulatory Visit (INDEPENDENT_AMBULATORY_CARE_PROVIDER_SITE_OTHER): Payer: Self-pay | Admitting: Vascular Surgery

## 2015-02-07 ENCOUNTER — Encounter: Payer: Self-pay | Admitting: Vascular Surgery

## 2015-02-07 VITALS — BP 139/67 | HR 85 | Resp 14 | Ht 64.5 in | Wt 123.0 lb

## 2015-02-07 DIAGNOSIS — Z89512 Acquired absence of left leg below knee: Secondary | ICD-10-CM

## 2015-02-07 DIAGNOSIS — Z89511 Acquired absence of right leg below knee: Secondary | ICD-10-CM | POA: Insufficient documentation

## 2015-02-07 NOTE — Progress Notes (Signed)
    Postoperative Visit   History of Present Illness  Garrett Salas is a 64 y.o. male who presents for postoperative follow-up for: right below-the-knee amputation (Date: 01/09/15).  The patient's wounds are healed.  The patient notes pain is well controlled.  The patient's current symptoms are: none.  For [facility] Use Only  PRE-ADM LIVING: Home  AMB STATUS: Ambulatory  Physical Examination  Filed Vitals:   02/07/15 1317  BP: 139/67  Pulse: 85  Resp: 14   RLE: Incision is healed.  Staples are intact.  Medical Decision Making  Garrett Salas is a 64 y.o. male who presents s/p R below-the-knee ampuation.  The patient's stump is healing appropriately with resolution of pre-operative symptoms. I discussed in depth with the patient the nature of atherosclerosis, and emphasized the importance of maximal medical management including strict control of blood pressure, blood glucose, and lipid levels, obtaining regular exercise, and cessation of smoking.  The patient is aware that without maximal medical management the underlying atherosclerotic disease process will progress, possibly leading to a more proximal amputation. The patient agrees to participate in their maximal medical care.  Thank you for allowing [place] to participate in this patient's care.  The patient has been referred for prosthetic fitting at this request  The patient can follow up with [place] as needed.  Adele Barthel, MD [facility] [place] Office: [phone] Pager: [phone]  02/07/2015, 1:49 PM

## 2015-02-09 DIAGNOSIS — E1129 Type 2 diabetes mellitus with other diabetic kidney complication: Secondary | ICD-10-CM | POA: Diagnosis not present

## 2015-02-09 DIAGNOSIS — D509 Iron deficiency anemia, unspecified: Secondary | ICD-10-CM | POA: Diagnosis not present

## 2015-02-09 DIAGNOSIS — N186 End stage renal disease: Secondary | ICD-10-CM | POA: Diagnosis not present

## 2015-02-09 DIAGNOSIS — D631 Anemia in chronic kidney disease: Secondary | ICD-10-CM | POA: Diagnosis not present

## 2015-02-09 DIAGNOSIS — N2581 Secondary hyperparathyroidism of renal origin: Secondary | ICD-10-CM | POA: Diagnosis not present

## 2015-02-12 DIAGNOSIS — E1129 Type 2 diabetes mellitus with other diabetic kidney complication: Secondary | ICD-10-CM | POA: Diagnosis not present

## 2015-02-12 DIAGNOSIS — N2581 Secondary hyperparathyroidism of renal origin: Secondary | ICD-10-CM | POA: Diagnosis not present

## 2015-02-12 DIAGNOSIS — D631 Anemia in chronic kidney disease: Secondary | ICD-10-CM | POA: Diagnosis not present

## 2015-02-12 DIAGNOSIS — D509 Iron deficiency anemia, unspecified: Secondary | ICD-10-CM | POA: Diagnosis not present

## 2015-02-12 DIAGNOSIS — N186 End stage renal disease: Secondary | ICD-10-CM | POA: Diagnosis not present

## 2015-02-13 DIAGNOSIS — Z4781 Encounter for orthopedic aftercare following surgical amputation: Secondary | ICD-10-CM | POA: Diagnosis not present

## 2015-02-13 DIAGNOSIS — Z794 Long term (current) use of insulin: Secondary | ICD-10-CM | POA: Diagnosis not present

## 2015-02-13 DIAGNOSIS — E119 Type 2 diabetes mellitus without complications: Secondary | ICD-10-CM | POA: Diagnosis not present

## 2015-02-13 DIAGNOSIS — I120 Hypertensive chronic kidney disease with stage 5 chronic kidney disease or end stage renal disease: Secondary | ICD-10-CM | POA: Diagnosis not present

## 2015-02-13 DIAGNOSIS — N186 End stage renal disease: Secondary | ICD-10-CM | POA: Diagnosis not present

## 2015-02-13 DIAGNOSIS — Z89511 Acquired absence of right leg below knee: Secondary | ICD-10-CM | POA: Diagnosis not present

## 2015-02-14 DIAGNOSIS — E1129 Type 2 diabetes mellitus with other diabetic kidney complication: Secondary | ICD-10-CM | POA: Diagnosis not present

## 2015-02-14 DIAGNOSIS — D631 Anemia in chronic kidney disease: Secondary | ICD-10-CM | POA: Diagnosis not present

## 2015-02-14 DIAGNOSIS — N186 End stage renal disease: Secondary | ICD-10-CM | POA: Diagnosis not present

## 2015-02-14 DIAGNOSIS — D509 Iron deficiency anemia, unspecified: Secondary | ICD-10-CM | POA: Diagnosis not present

## 2015-02-14 DIAGNOSIS — N2581 Secondary hyperparathyroidism of renal origin: Secondary | ICD-10-CM | POA: Diagnosis not present

## 2015-02-14 DIAGNOSIS — Z992 Dependence on renal dialysis: Secondary | ICD-10-CM | POA: Diagnosis not present

## 2015-02-15 DIAGNOSIS — Z4781 Encounter for orthopedic aftercare following surgical amputation: Secondary | ICD-10-CM | POA: Diagnosis not present

## 2015-02-15 DIAGNOSIS — Z89511 Acquired absence of right leg below knee: Secondary | ICD-10-CM | POA: Diagnosis not present

## 2015-02-15 DIAGNOSIS — Z794 Long term (current) use of insulin: Secondary | ICD-10-CM | POA: Diagnosis not present

## 2015-02-15 DIAGNOSIS — N186 End stage renal disease: Secondary | ICD-10-CM | POA: Diagnosis not present

## 2015-02-15 DIAGNOSIS — E119 Type 2 diabetes mellitus without complications: Secondary | ICD-10-CM | POA: Diagnosis not present

## 2015-02-15 DIAGNOSIS — I120 Hypertensive chronic kidney disease with stage 5 chronic kidney disease or end stage renal disease: Secondary | ICD-10-CM | POA: Diagnosis not present

## 2015-02-16 DIAGNOSIS — N2581 Secondary hyperparathyroidism of renal origin: Secondary | ICD-10-CM | POA: Diagnosis not present

## 2015-02-16 DIAGNOSIS — D509 Iron deficiency anemia, unspecified: Secondary | ICD-10-CM | POA: Diagnosis not present

## 2015-02-16 DIAGNOSIS — N186 End stage renal disease: Secondary | ICD-10-CM | POA: Diagnosis not present

## 2015-02-16 DIAGNOSIS — E1129 Type 2 diabetes mellitus with other diabetic kidney complication: Secondary | ICD-10-CM | POA: Diagnosis not present

## 2015-02-16 DIAGNOSIS — E875 Hyperkalemia: Secondary | ICD-10-CM | POA: Diagnosis not present

## 2015-02-18 DIAGNOSIS — I120 Hypertensive chronic kidney disease with stage 5 chronic kidney disease or end stage renal disease: Secondary | ICD-10-CM | POA: Diagnosis not present

## 2015-02-18 DIAGNOSIS — E119 Type 2 diabetes mellitus without complications: Secondary | ICD-10-CM | POA: Diagnosis not present

## 2015-02-18 DIAGNOSIS — N186 End stage renal disease: Secondary | ICD-10-CM | POA: Diagnosis not present

## 2015-02-18 DIAGNOSIS — Z4781 Encounter for orthopedic aftercare following surgical amputation: Secondary | ICD-10-CM | POA: Diagnosis not present

## 2015-02-18 DIAGNOSIS — Z89511 Acquired absence of right leg below knee: Secondary | ICD-10-CM | POA: Diagnosis not present

## 2015-02-18 DIAGNOSIS — Z794 Long term (current) use of insulin: Secondary | ICD-10-CM | POA: Diagnosis not present

## 2015-02-19 DIAGNOSIS — N2581 Secondary hyperparathyroidism of renal origin: Secondary | ICD-10-CM | POA: Diagnosis not present

## 2015-02-19 DIAGNOSIS — E875 Hyperkalemia: Secondary | ICD-10-CM | POA: Diagnosis not present

## 2015-02-19 DIAGNOSIS — E1129 Type 2 diabetes mellitus with other diabetic kidney complication: Secondary | ICD-10-CM | POA: Diagnosis not present

## 2015-02-19 DIAGNOSIS — D509 Iron deficiency anemia, unspecified: Secondary | ICD-10-CM | POA: Diagnosis not present

## 2015-02-19 DIAGNOSIS — N186 End stage renal disease: Secondary | ICD-10-CM | POA: Diagnosis not present

## 2015-02-20 DIAGNOSIS — Z89511 Acquired absence of right leg below knee: Secondary | ICD-10-CM | POA: Diagnosis not present

## 2015-02-20 DIAGNOSIS — N186 End stage renal disease: Secondary | ICD-10-CM | POA: Diagnosis not present

## 2015-02-20 DIAGNOSIS — Z4781 Encounter for orthopedic aftercare following surgical amputation: Secondary | ICD-10-CM | POA: Diagnosis not present

## 2015-02-20 DIAGNOSIS — Z794 Long term (current) use of insulin: Secondary | ICD-10-CM | POA: Diagnosis not present

## 2015-02-20 DIAGNOSIS — E119 Type 2 diabetes mellitus without complications: Secondary | ICD-10-CM | POA: Diagnosis not present

## 2015-02-20 DIAGNOSIS — I120 Hypertensive chronic kidney disease with stage 5 chronic kidney disease or end stage renal disease: Secondary | ICD-10-CM | POA: Diagnosis not present

## 2015-02-21 DIAGNOSIS — E1129 Type 2 diabetes mellitus with other diabetic kidney complication: Secondary | ICD-10-CM | POA: Diagnosis not present

## 2015-02-21 DIAGNOSIS — E875 Hyperkalemia: Secondary | ICD-10-CM | POA: Diagnosis not present

## 2015-02-21 DIAGNOSIS — N2581 Secondary hyperparathyroidism of renal origin: Secondary | ICD-10-CM | POA: Diagnosis not present

## 2015-02-21 DIAGNOSIS — D509 Iron deficiency anemia, unspecified: Secondary | ICD-10-CM | POA: Diagnosis not present

## 2015-02-21 DIAGNOSIS — N186 End stage renal disease: Secondary | ICD-10-CM | POA: Diagnosis not present

## 2015-02-22 ENCOUNTER — Encounter: Payer: Medicare Other | Admitting: Vascular Surgery

## 2015-02-22 DIAGNOSIS — N186 End stage renal disease: Secondary | ICD-10-CM | POA: Diagnosis not present

## 2015-02-22 DIAGNOSIS — E119 Type 2 diabetes mellitus without complications: Secondary | ICD-10-CM | POA: Diagnosis not present

## 2015-02-22 DIAGNOSIS — I120 Hypertensive chronic kidney disease with stage 5 chronic kidney disease or end stage renal disease: Secondary | ICD-10-CM | POA: Diagnosis not present

## 2015-02-22 DIAGNOSIS — Z794 Long term (current) use of insulin: Secondary | ICD-10-CM | POA: Diagnosis not present

## 2015-02-22 DIAGNOSIS — Z89511 Acquired absence of right leg below knee: Secondary | ICD-10-CM | POA: Diagnosis not present

## 2015-02-22 DIAGNOSIS — Z4781 Encounter for orthopedic aftercare following surgical amputation: Secondary | ICD-10-CM | POA: Diagnosis not present

## 2015-02-23 DIAGNOSIS — E875 Hyperkalemia: Secondary | ICD-10-CM | POA: Diagnosis not present

## 2015-02-23 DIAGNOSIS — D509 Iron deficiency anemia, unspecified: Secondary | ICD-10-CM | POA: Diagnosis not present

## 2015-02-23 DIAGNOSIS — N186 End stage renal disease: Secondary | ICD-10-CM | POA: Diagnosis not present

## 2015-02-23 DIAGNOSIS — N2581 Secondary hyperparathyroidism of renal origin: Secondary | ICD-10-CM | POA: Diagnosis not present

## 2015-02-23 DIAGNOSIS — E1129 Type 2 diabetes mellitus with other diabetic kidney complication: Secondary | ICD-10-CM | POA: Diagnosis not present

## 2015-02-25 DIAGNOSIS — E119 Type 2 diabetes mellitus without complications: Secondary | ICD-10-CM | POA: Diagnosis not present

## 2015-02-25 DIAGNOSIS — Z89511 Acquired absence of right leg below knee: Secondary | ICD-10-CM | POA: Diagnosis not present

## 2015-02-25 DIAGNOSIS — N186 End stage renal disease: Secondary | ICD-10-CM | POA: Diagnosis not present

## 2015-02-25 DIAGNOSIS — Z794 Long term (current) use of insulin: Secondary | ICD-10-CM | POA: Diagnosis not present

## 2015-02-25 DIAGNOSIS — I120 Hypertensive chronic kidney disease with stage 5 chronic kidney disease or end stage renal disease: Secondary | ICD-10-CM | POA: Diagnosis not present

## 2015-02-25 DIAGNOSIS — Z4781 Encounter for orthopedic aftercare following surgical amputation: Secondary | ICD-10-CM | POA: Diagnosis not present

## 2015-02-26 DIAGNOSIS — E1129 Type 2 diabetes mellitus with other diabetic kidney complication: Secondary | ICD-10-CM | POA: Diagnosis not present

## 2015-02-26 DIAGNOSIS — N2581 Secondary hyperparathyroidism of renal origin: Secondary | ICD-10-CM | POA: Diagnosis not present

## 2015-02-26 DIAGNOSIS — E875 Hyperkalemia: Secondary | ICD-10-CM | POA: Diagnosis not present

## 2015-02-26 DIAGNOSIS — D509 Iron deficiency anemia, unspecified: Secondary | ICD-10-CM | POA: Diagnosis not present

## 2015-02-26 DIAGNOSIS — N186 End stage renal disease: Secondary | ICD-10-CM | POA: Diagnosis not present

## 2015-02-28 DIAGNOSIS — N2581 Secondary hyperparathyroidism of renal origin: Secondary | ICD-10-CM | POA: Diagnosis not present

## 2015-02-28 DIAGNOSIS — E875 Hyperkalemia: Secondary | ICD-10-CM | POA: Diagnosis not present

## 2015-02-28 DIAGNOSIS — N186 End stage renal disease: Secondary | ICD-10-CM | POA: Diagnosis not present

## 2015-02-28 DIAGNOSIS — E1129 Type 2 diabetes mellitus with other diabetic kidney complication: Secondary | ICD-10-CM | POA: Diagnosis not present

## 2015-02-28 DIAGNOSIS — D509 Iron deficiency anemia, unspecified: Secondary | ICD-10-CM | POA: Diagnosis not present

## 2015-03-02 DIAGNOSIS — E1129 Type 2 diabetes mellitus with other diabetic kidney complication: Secondary | ICD-10-CM | POA: Diagnosis not present

## 2015-03-02 DIAGNOSIS — N2581 Secondary hyperparathyroidism of renal origin: Secondary | ICD-10-CM | POA: Diagnosis not present

## 2015-03-02 DIAGNOSIS — N186 End stage renal disease: Secondary | ICD-10-CM | POA: Diagnosis not present

## 2015-03-02 DIAGNOSIS — E875 Hyperkalemia: Secondary | ICD-10-CM | POA: Diagnosis not present

## 2015-03-02 DIAGNOSIS — D509 Iron deficiency anemia, unspecified: Secondary | ICD-10-CM | POA: Diagnosis not present

## 2015-03-04 ENCOUNTER — Ambulatory Visit: Payer: Medicare Other

## 2015-03-04 DIAGNOSIS — I120 Hypertensive chronic kidney disease with stage 5 chronic kidney disease or end stage renal disease: Secondary | ICD-10-CM | POA: Diagnosis not present

## 2015-03-04 DIAGNOSIS — N186 End stage renal disease: Secondary | ICD-10-CM | POA: Diagnosis not present

## 2015-03-04 DIAGNOSIS — E119 Type 2 diabetes mellitus without complications: Secondary | ICD-10-CM | POA: Diagnosis not present

## 2015-03-04 DIAGNOSIS — Z4781 Encounter for orthopedic aftercare following surgical amputation: Secondary | ICD-10-CM | POA: Diagnosis not present

## 2015-03-04 DIAGNOSIS — Z794 Long term (current) use of insulin: Secondary | ICD-10-CM | POA: Diagnosis not present

## 2015-03-04 DIAGNOSIS — Z89511 Acquired absence of right leg below knee: Secondary | ICD-10-CM | POA: Diagnosis not present

## 2015-03-05 DIAGNOSIS — E875 Hyperkalemia: Secondary | ICD-10-CM | POA: Diagnosis not present

## 2015-03-05 DIAGNOSIS — N2581 Secondary hyperparathyroidism of renal origin: Secondary | ICD-10-CM | POA: Diagnosis not present

## 2015-03-05 DIAGNOSIS — E1129 Type 2 diabetes mellitus with other diabetic kidney complication: Secondary | ICD-10-CM | POA: Diagnosis not present

## 2015-03-05 DIAGNOSIS — D509 Iron deficiency anemia, unspecified: Secondary | ICD-10-CM | POA: Diagnosis not present

## 2015-03-05 DIAGNOSIS — N186 End stage renal disease: Secondary | ICD-10-CM | POA: Diagnosis not present

## 2015-03-07 DIAGNOSIS — D509 Iron deficiency anemia, unspecified: Secondary | ICD-10-CM | POA: Diagnosis not present

## 2015-03-07 DIAGNOSIS — N186 End stage renal disease: Secondary | ICD-10-CM | POA: Diagnosis not present

## 2015-03-07 DIAGNOSIS — E875 Hyperkalemia: Secondary | ICD-10-CM | POA: Diagnosis not present

## 2015-03-07 DIAGNOSIS — N2581 Secondary hyperparathyroidism of renal origin: Secondary | ICD-10-CM | POA: Diagnosis not present

## 2015-03-07 DIAGNOSIS — E1129 Type 2 diabetes mellitus with other diabetic kidney complication: Secondary | ICD-10-CM | POA: Diagnosis not present

## 2015-03-09 DIAGNOSIS — N2581 Secondary hyperparathyroidism of renal origin: Secondary | ICD-10-CM | POA: Diagnosis not present

## 2015-03-09 DIAGNOSIS — D509 Iron deficiency anemia, unspecified: Secondary | ICD-10-CM | POA: Diagnosis not present

## 2015-03-09 DIAGNOSIS — E875 Hyperkalemia: Secondary | ICD-10-CM | POA: Diagnosis not present

## 2015-03-09 DIAGNOSIS — N186 End stage renal disease: Secondary | ICD-10-CM | POA: Diagnosis not present

## 2015-03-09 DIAGNOSIS — E1129 Type 2 diabetes mellitus with other diabetic kidney complication: Secondary | ICD-10-CM | POA: Diagnosis not present

## 2015-03-11 DIAGNOSIS — E119 Type 2 diabetes mellitus without complications: Secondary | ICD-10-CM | POA: Diagnosis not present

## 2015-03-11 DIAGNOSIS — Z794 Long term (current) use of insulin: Secondary | ICD-10-CM | POA: Diagnosis not present

## 2015-03-11 DIAGNOSIS — N186 End stage renal disease: Secondary | ICD-10-CM | POA: Diagnosis not present

## 2015-03-11 DIAGNOSIS — I120 Hypertensive chronic kidney disease with stage 5 chronic kidney disease or end stage renal disease: Secondary | ICD-10-CM | POA: Diagnosis not present

## 2015-03-11 DIAGNOSIS — Z4781 Encounter for orthopedic aftercare following surgical amputation: Secondary | ICD-10-CM | POA: Diagnosis not present

## 2015-03-11 DIAGNOSIS — Z89511 Acquired absence of right leg below knee: Secondary | ICD-10-CM | POA: Diagnosis not present

## 2015-03-12 DIAGNOSIS — E875 Hyperkalemia: Secondary | ICD-10-CM | POA: Diagnosis not present

## 2015-03-12 DIAGNOSIS — E1129 Type 2 diabetes mellitus with other diabetic kidney complication: Secondary | ICD-10-CM | POA: Diagnosis not present

## 2015-03-12 DIAGNOSIS — N2581 Secondary hyperparathyroidism of renal origin: Secondary | ICD-10-CM | POA: Diagnosis not present

## 2015-03-12 DIAGNOSIS — N186 End stage renal disease: Secondary | ICD-10-CM | POA: Diagnosis not present

## 2015-03-12 DIAGNOSIS — D509 Iron deficiency anemia, unspecified: Secondary | ICD-10-CM | POA: Diagnosis not present

## 2015-03-14 DIAGNOSIS — E1129 Type 2 diabetes mellitus with other diabetic kidney complication: Secondary | ICD-10-CM | POA: Diagnosis not present

## 2015-03-14 DIAGNOSIS — E875 Hyperkalemia: Secondary | ICD-10-CM | POA: Diagnosis not present

## 2015-03-14 DIAGNOSIS — N186 End stage renal disease: Secondary | ICD-10-CM | POA: Diagnosis not present

## 2015-03-14 DIAGNOSIS — N2581 Secondary hyperparathyroidism of renal origin: Secondary | ICD-10-CM | POA: Diagnosis not present

## 2015-03-14 DIAGNOSIS — D509 Iron deficiency anemia, unspecified: Secondary | ICD-10-CM | POA: Diagnosis not present

## 2015-03-16 DIAGNOSIS — E875 Hyperkalemia: Secondary | ICD-10-CM | POA: Diagnosis not present

## 2015-03-16 DIAGNOSIS — Z992 Dependence on renal dialysis: Secondary | ICD-10-CM | POA: Diagnosis not present

## 2015-03-16 DIAGNOSIS — E1129 Type 2 diabetes mellitus with other diabetic kidney complication: Secondary | ICD-10-CM | POA: Diagnosis not present

## 2015-03-16 DIAGNOSIS — D509 Iron deficiency anemia, unspecified: Secondary | ICD-10-CM | POA: Diagnosis not present

## 2015-03-16 DIAGNOSIS — N2581 Secondary hyperparathyroidism of renal origin: Secondary | ICD-10-CM | POA: Diagnosis not present

## 2015-03-16 DIAGNOSIS — N186 End stage renal disease: Secondary | ICD-10-CM | POA: Diagnosis not present

## 2015-03-17 DIAGNOSIS — N186 End stage renal disease: Secondary | ICD-10-CM | POA: Diagnosis not present

## 2015-03-17 DIAGNOSIS — Z89511 Acquired absence of right leg below knee: Secondary | ICD-10-CM | POA: Diagnosis not present

## 2015-03-17 DIAGNOSIS — E119 Type 2 diabetes mellitus without complications: Secondary | ICD-10-CM | POA: Diagnosis not present

## 2015-03-17 DIAGNOSIS — Z794 Long term (current) use of insulin: Secondary | ICD-10-CM | POA: Diagnosis not present

## 2015-03-17 DIAGNOSIS — I120 Hypertensive chronic kidney disease with stage 5 chronic kidney disease or end stage renal disease: Secondary | ICD-10-CM | POA: Diagnosis not present

## 2015-03-17 DIAGNOSIS — Z4781 Encounter for orthopedic aftercare following surgical amputation: Secondary | ICD-10-CM | POA: Diagnosis not present

## 2015-03-18 DIAGNOSIS — Z794 Long term (current) use of insulin: Secondary | ICD-10-CM | POA: Diagnosis not present

## 2015-03-18 DIAGNOSIS — E119 Type 2 diabetes mellitus without complications: Secondary | ICD-10-CM | POA: Diagnosis not present

## 2015-03-18 DIAGNOSIS — T82858A Stenosis of vascular prosthetic devices, implants and grafts, initial encounter: Secondary | ICD-10-CM | POA: Diagnosis not present

## 2015-03-18 DIAGNOSIS — I120 Hypertensive chronic kidney disease with stage 5 chronic kidney disease or end stage renal disease: Secondary | ICD-10-CM | POA: Diagnosis not present

## 2015-03-18 DIAGNOSIS — I871 Compression of vein: Secondary | ICD-10-CM | POA: Diagnosis not present

## 2015-03-18 DIAGNOSIS — Z4781 Encounter for orthopedic aftercare following surgical amputation: Secondary | ICD-10-CM | POA: Diagnosis not present

## 2015-03-18 DIAGNOSIS — N186 End stage renal disease: Secondary | ICD-10-CM | POA: Diagnosis not present

## 2015-03-18 DIAGNOSIS — Z89511 Acquired absence of right leg below knee: Secondary | ICD-10-CM | POA: Diagnosis not present

## 2015-03-19 DIAGNOSIS — N186 End stage renal disease: Secondary | ICD-10-CM | POA: Diagnosis not present

## 2015-03-19 DIAGNOSIS — D631 Anemia in chronic kidney disease: Secondary | ICD-10-CM | POA: Diagnosis not present

## 2015-03-19 DIAGNOSIS — N2581 Secondary hyperparathyroidism of renal origin: Secondary | ICD-10-CM | POA: Diagnosis not present

## 2015-03-19 DIAGNOSIS — E1129 Type 2 diabetes mellitus with other diabetic kidney complication: Secondary | ICD-10-CM | POA: Diagnosis not present

## 2015-03-21 DIAGNOSIS — E1129 Type 2 diabetes mellitus with other diabetic kidney complication: Secondary | ICD-10-CM | POA: Diagnosis not present

## 2015-03-21 DIAGNOSIS — N2581 Secondary hyperparathyroidism of renal origin: Secondary | ICD-10-CM | POA: Diagnosis not present

## 2015-03-21 DIAGNOSIS — N186 End stage renal disease: Secondary | ICD-10-CM | POA: Diagnosis not present

## 2015-03-21 DIAGNOSIS — D631 Anemia in chronic kidney disease: Secondary | ICD-10-CM | POA: Diagnosis not present

## 2015-03-23 DIAGNOSIS — N186 End stage renal disease: Secondary | ICD-10-CM | POA: Diagnosis not present

## 2015-03-23 DIAGNOSIS — D631 Anemia in chronic kidney disease: Secondary | ICD-10-CM | POA: Diagnosis not present

## 2015-03-23 DIAGNOSIS — N2581 Secondary hyperparathyroidism of renal origin: Secondary | ICD-10-CM | POA: Diagnosis not present

## 2015-03-23 DIAGNOSIS — E1129 Type 2 diabetes mellitus with other diabetic kidney complication: Secondary | ICD-10-CM | POA: Diagnosis not present

## 2015-03-25 DIAGNOSIS — N186 End stage renal disease: Secondary | ICD-10-CM | POA: Diagnosis not present

## 2015-03-25 DIAGNOSIS — Z794 Long term (current) use of insulin: Secondary | ICD-10-CM | POA: Diagnosis not present

## 2015-03-25 DIAGNOSIS — Z89511 Acquired absence of right leg below knee: Secondary | ICD-10-CM | POA: Diagnosis not present

## 2015-03-25 DIAGNOSIS — I120 Hypertensive chronic kidney disease with stage 5 chronic kidney disease or end stage renal disease: Secondary | ICD-10-CM | POA: Diagnosis not present

## 2015-03-25 DIAGNOSIS — E119 Type 2 diabetes mellitus without complications: Secondary | ICD-10-CM | POA: Diagnosis not present

## 2015-03-25 DIAGNOSIS — Z4781 Encounter for orthopedic aftercare following surgical amputation: Secondary | ICD-10-CM | POA: Diagnosis not present

## 2015-03-26 DIAGNOSIS — D631 Anemia in chronic kidney disease: Secondary | ICD-10-CM | POA: Diagnosis not present

## 2015-03-26 DIAGNOSIS — N2581 Secondary hyperparathyroidism of renal origin: Secondary | ICD-10-CM | POA: Diagnosis not present

## 2015-03-26 DIAGNOSIS — E1129 Type 2 diabetes mellitus with other diabetic kidney complication: Secondary | ICD-10-CM | POA: Diagnosis not present

## 2015-03-26 DIAGNOSIS — N186 End stage renal disease: Secondary | ICD-10-CM | POA: Diagnosis not present

## 2015-03-28 DIAGNOSIS — D631 Anemia in chronic kidney disease: Secondary | ICD-10-CM | POA: Diagnosis not present

## 2015-03-28 DIAGNOSIS — N2581 Secondary hyperparathyroidism of renal origin: Secondary | ICD-10-CM | POA: Diagnosis not present

## 2015-03-28 DIAGNOSIS — E1129 Type 2 diabetes mellitus with other diabetic kidney complication: Secondary | ICD-10-CM | POA: Diagnosis not present

## 2015-03-28 DIAGNOSIS — N186 End stage renal disease: Secondary | ICD-10-CM | POA: Diagnosis not present

## 2015-03-29 ENCOUNTER — Encounter: Payer: Self-pay | Admitting: Podiatrist

## 2015-03-29 ENCOUNTER — Ambulatory Visit (INDEPENDENT_AMBULATORY_CARE_PROVIDER_SITE_OTHER): Payer: Medicare Other | Admitting: Podiatrist

## 2015-03-29 VITALS — BP 166/76 | HR 63 | Resp 18

## 2015-03-29 DIAGNOSIS — I70269 Atherosclerosis of native arteries of extremities with gangrene, unspecified extremity: Secondary | ICD-10-CM

## 2015-03-29 DIAGNOSIS — E1140 Type 2 diabetes mellitus with diabetic neuropathy, unspecified: Secondary | ICD-10-CM | POA: Diagnosis not present

## 2015-03-29 DIAGNOSIS — L97521 Non-pressure chronic ulcer of other part of left foot limited to breakdown of skin: Secondary | ICD-10-CM

## 2015-03-29 DIAGNOSIS — I739 Peripheral vascular disease, unspecified: Secondary | ICD-10-CM

## 2015-03-29 DIAGNOSIS — L89891 Pressure ulcer of other site, stage 1: Secondary | ICD-10-CM | POA: Diagnosis not present

## 2015-03-29 MED ORDER — SILVER SULFADIAZINE 1 % EX CREA: 1.0000 "application " | TOPICAL_CREAM | Freq: Every day | CUTANEOUS | Status: DC

## 2015-03-29 NOTE — Patient Instructions (Signed)
Instructions for Wound Care  The most important step to healing a foot wound is to reduce the pressure on your foot - it is extremely important to stay off your foot as much as possible and wear the shoe/boot as instructed.  Cleanse your foot with saline wash or warm soapy water (dial antibacterial soap or similar).  Blot dry.  Apply prescribed medication to your wound and cover with gauze and a bandage.  May hold bandage in place with Coban (self sticky wrap), Ace bandage or tape.  You may find dressing supplies at your local [employer], [employer], drug store or medical supply store.  Your prescribed topical medication is :  Silvadene Cream (twice daily)  If you notice any foul odor, increase in pain, pus, increased swelling, red streaks or generalized redness occurring in your foot or leg-Call our office immediately to be seen.  This may be a sign of a limb or life threatening infection that will need prompt attention.

## 2015-03-29 NOTE — Progress Notes (Signed)
   Subjective:    Patient ID: Garrett Salas, male    DOB: November 26, 1950, 64 y.o.   MRN: [id number]  Chief Complaint  Patient presents with  . Foot Ulcer    LEFT FOOT HAS ANOTHER SORE ON MY BIG TOE AND 2ND TOE  . Nail Problem    TRIM MY NAILS ON MY left FOOT ONLY    Patient had a below knee amputation on the right foot due to pvd and non healing wounds.  Performed 01/09/15   Objective:   Physical Exam Chart reviewed. Neurological status unchanged with neuropathy noted.  Non palpable pedal pulses left withdocumented pvd noted.  Patient also in renal failure.  ampuation of the right below the knee has been performed.  Left hallux has a superficial excoriation present.  No malodor, no probing to bone, no drainage noted.  Left 2nd toe also has a superficial ulceration present that appears stable.  All nails are thick and mycotic on the left foot.      Assessment & Plan:  PVD renal failure s/p bka right, superficial vascular ulcerations left x 2, mycotic toenails, diabetic neuropathy  Plan:  The ulcerations are superficial and stable. They are left undisturbed.  debriement of toenails is accomplished today without complication.  Recommended use of silvadene cream and a light gauze dressing.  Recommended follow up with vascular regarding blood flow to the left foot due to the recent ulcerations.

## 2015-03-30 DIAGNOSIS — N2581 Secondary hyperparathyroidism of renal origin: Secondary | ICD-10-CM | POA: Diagnosis not present

## 2015-03-30 DIAGNOSIS — N186 End stage renal disease: Secondary | ICD-10-CM | POA: Diagnosis not present

## 2015-03-30 DIAGNOSIS — E1129 Type 2 diabetes mellitus with other diabetic kidney complication: Secondary | ICD-10-CM | POA: Diagnosis not present

## 2015-03-30 DIAGNOSIS — D631 Anemia in chronic kidney disease: Secondary | ICD-10-CM | POA: Diagnosis not present

## 2015-04-01 DIAGNOSIS — E1129 Type 2 diabetes mellitus with other diabetic kidney complication: Secondary | ICD-10-CM | POA: Diagnosis not present

## 2015-04-01 DIAGNOSIS — D631 Anemia in chronic kidney disease: Secondary | ICD-10-CM | POA: Diagnosis not present

## 2015-04-01 DIAGNOSIS — N2581 Secondary hyperparathyroidism of renal origin: Secondary | ICD-10-CM | POA: Diagnosis not present

## 2015-04-01 DIAGNOSIS — N186 End stage renal disease: Secondary | ICD-10-CM | POA: Diagnosis not present

## 2015-04-02 DIAGNOSIS — I120 Hypertensive chronic kidney disease with stage 5 chronic kidney disease or end stage renal disease: Secondary | ICD-10-CM | POA: Diagnosis not present

## 2015-04-02 DIAGNOSIS — Z89511 Acquired absence of right leg below knee: Secondary | ICD-10-CM | POA: Diagnosis not present

## 2015-04-02 DIAGNOSIS — N186 End stage renal disease: Secondary | ICD-10-CM | POA: Diagnosis not present

## 2015-04-02 DIAGNOSIS — E119 Type 2 diabetes mellitus without complications: Secondary | ICD-10-CM | POA: Diagnosis not present

## 2015-04-02 DIAGNOSIS — Z794 Long term (current) use of insulin: Secondary | ICD-10-CM | POA: Diagnosis not present

## 2015-04-02 DIAGNOSIS — Z4781 Encounter for orthopedic aftercare following surgical amputation: Secondary | ICD-10-CM | POA: Diagnosis not present

## 2015-04-04 DIAGNOSIS — D631 Anemia in chronic kidney disease: Secondary | ICD-10-CM | POA: Diagnosis not present

## 2015-04-04 DIAGNOSIS — E1129 Type 2 diabetes mellitus with other diabetic kidney complication: Secondary | ICD-10-CM | POA: Diagnosis not present

## 2015-04-04 DIAGNOSIS — N186 End stage renal disease: Secondary | ICD-10-CM | POA: Diagnosis not present

## 2015-04-04 DIAGNOSIS — N2581 Secondary hyperparathyroidism of renal origin: Secondary | ICD-10-CM | POA: Diagnosis not present

## 2015-04-06 DIAGNOSIS — N186 End stage renal disease: Secondary | ICD-10-CM | POA: Diagnosis not present

## 2015-04-06 DIAGNOSIS — E1129 Type 2 diabetes mellitus with other diabetic kidney complication: Secondary | ICD-10-CM | POA: Diagnosis not present

## 2015-04-06 DIAGNOSIS — D631 Anemia in chronic kidney disease: Secondary | ICD-10-CM | POA: Diagnosis not present

## 2015-04-06 DIAGNOSIS — N2581 Secondary hyperparathyroidism of renal origin: Secondary | ICD-10-CM | POA: Diagnosis not present

## 2015-04-08 ENCOUNTER — Telehealth: Payer: Self-pay | Admitting: *Deleted

## 2015-04-08 DIAGNOSIS — Z794 Long term (current) use of insulin: Secondary | ICD-10-CM | POA: Diagnosis not present

## 2015-04-08 DIAGNOSIS — I120 Hypertensive chronic kidney disease with stage 5 chronic kidney disease or end stage renal disease: Secondary | ICD-10-CM | POA: Diagnosis not present

## 2015-04-08 DIAGNOSIS — N186 End stage renal disease: Secondary | ICD-10-CM | POA: Diagnosis not present

## 2015-04-08 DIAGNOSIS — Z4781 Encounter for orthopedic aftercare following surgical amputation: Secondary | ICD-10-CM | POA: Diagnosis not present

## 2015-04-08 DIAGNOSIS — E119 Type 2 diabetes mellitus without complications: Secondary | ICD-10-CM | POA: Diagnosis not present

## 2015-04-08 DIAGNOSIS — Z89511 Acquired absence of right leg below knee: Secondary | ICD-10-CM | POA: Diagnosis not present

## 2015-04-08 NOTE — Telephone Encounter (Signed)
Garrett Salas states pt has another ulcer on his left 2nd toe dorsal, he is using Silvadene cream for another wound, and has a follow up appt on Thursday.  I informed pt he should use the Silvadene Cream on the left 2nd toe and keep his Thursday appt.  I told him I would inform Dr. Valentina Lucks and call with any other instructions.  Pt agreed.

## 2015-04-08 NOTE — Telephone Encounter (Signed)
Yes, sounds good. Thx.

## 2015-04-09 DIAGNOSIS — E1129 Type 2 diabetes mellitus with other diabetic kidney complication: Secondary | ICD-10-CM | POA: Diagnosis not present

## 2015-04-09 DIAGNOSIS — N2581 Secondary hyperparathyroidism of renal origin: Secondary | ICD-10-CM | POA: Diagnosis not present

## 2015-04-09 DIAGNOSIS — D631 Anemia in chronic kidney disease: Secondary | ICD-10-CM | POA: Diagnosis not present

## 2015-04-09 DIAGNOSIS — N186 End stage renal disease: Secondary | ICD-10-CM | POA: Diagnosis not present

## 2015-04-11 ENCOUNTER — Ambulatory Visit (INDEPENDENT_AMBULATORY_CARE_PROVIDER_SITE_OTHER): Payer: Medicare Other | Admitting: Podiatrist

## 2015-04-11 ENCOUNTER — Encounter: Payer: Self-pay | Admitting: Podiatrist

## 2015-04-11 VITALS — BP 145/59 | HR 67 | Resp 18

## 2015-04-11 DIAGNOSIS — I70269 Atherosclerosis of native arteries of extremities with gangrene, unspecified extremity: Secondary | ICD-10-CM

## 2015-04-11 DIAGNOSIS — E1140 Type 2 diabetes mellitus with diabetic neuropathy, unspecified: Secondary | ICD-10-CM | POA: Diagnosis not present

## 2015-04-11 DIAGNOSIS — L97521 Non-pressure chronic ulcer of other part of left foot limited to breakdown of skin: Secondary | ICD-10-CM | POA: Diagnosis not present

## 2015-04-11 DIAGNOSIS — I739 Peripheral vascular disease, unspecified: Secondary | ICD-10-CM | POA: Diagnosis not present

## 2015-04-11 DIAGNOSIS — E1129 Type 2 diabetes mellitus with other diabetic kidney complication: Secondary | ICD-10-CM | POA: Diagnosis not present

## 2015-04-11 DIAGNOSIS — N186 End stage renal disease: Secondary | ICD-10-CM | POA: Diagnosis not present

## 2015-04-11 DIAGNOSIS — N2581 Secondary hyperparathyroidism of renal origin: Secondary | ICD-10-CM | POA: Diagnosis not present

## 2015-04-11 DIAGNOSIS — D631 Anemia in chronic kidney disease: Secondary | ICD-10-CM | POA: Diagnosis not present

## 2015-04-11 MED ORDER — CEPHALEXIN 500 MG PO CAPS: 500.0000 mg | ORAL_CAPSULE | Freq: Three times a day (TID) | ORAL | Status: DC

## 2015-04-11 NOTE — Progress Notes (Signed)
    Subjective:    Patient ID: Garrett Salas, male    DOB: 1950/12/28, 64 y.o.   MRN: [id number]  Chief Complaint  Patient presents with  . Foot Ulcer    MY BIG TOE AND 2ND TOE ON MY LEFT FOOT AND FEELS HOT AND FEELS LIKE BLOOD IS DRIPPING    Patient had a below knee amputation on the right foot due to pvd and non healing wounds.  Performed 01/09/15.  Dr. Bridgett Larsson is his vascular surgeon. He presents today for follow up of ulcers on the left foot  He also has new onset blistering on the dorsum of the third and fourth toes at the pip joint left.     Objective:   Physical Exam Chart reviewed. Neurological status unchanged with neuropathy noted.  Non palpable pedal pulses left with documented pvd noted.  Patient also in renal failure.  ampuation of the right below the knee has been performed.  Left hallux has a unchanged  superficial excoriation present.  No malodor, no probing to bone, no drainage noted.  Left 2nd toe also has a superficial ulceration present that appears stable.  Digits 3,4 have blisters present which are non infected and appear stable. Slight redness is present on the dorsum of the left foot.  Presence of non healing ulcers and new onset blistering of toes is worrisome for worsening blood flow to the left foot.       Assessment & Plan:  PVD renal failure s/p bka right, superficial vascular ulcerations left x 2, new stable blisters x 2, diabetic neuropathy  Plan:  The ulcerations are superficial and stable. They are left undisturbed.  Formal referral back to [facility] is made today as I have recommended he call himself for a follow up at the last visit and he did not follow through.  We will get the appointment scheduled for him.  He will be seen back for a recheck in 2 weeks.

## 2015-04-11 NOTE — Patient Instructions (Signed)
I am referring you back for some circulation studies and for a check with Dr. Bridgett Larsson to recheck the status of your circulation.  [facility] will call you to set up the appointment.  I called in a prescription antibiotic to your pharmacy- go ahead and start taking the medication  Keep putting silvadene cream on the lesions. You do not need to wrap up your foot unless a blister pops and you need to soak up the drainage.

## 2015-04-13 DIAGNOSIS — N186 End stage renal disease: Secondary | ICD-10-CM | POA: Diagnosis not present

## 2015-04-13 DIAGNOSIS — E1129 Type 2 diabetes mellitus with other diabetic kidney complication: Secondary | ICD-10-CM | POA: Diagnosis not present

## 2015-04-13 DIAGNOSIS — N2581 Secondary hyperparathyroidism of renal origin: Secondary | ICD-10-CM | POA: Diagnosis not present

## 2015-04-13 DIAGNOSIS — D631 Anemia in chronic kidney disease: Secondary | ICD-10-CM | POA: Diagnosis not present

## 2015-04-15 DIAGNOSIS — E119 Type 2 diabetes mellitus without complications: Secondary | ICD-10-CM | POA: Diagnosis not present

## 2015-04-15 DIAGNOSIS — Z89511 Acquired absence of right leg below knee: Secondary | ICD-10-CM | POA: Diagnosis not present

## 2015-04-15 DIAGNOSIS — Z794 Long term (current) use of insulin: Secondary | ICD-10-CM | POA: Diagnosis not present

## 2015-04-15 DIAGNOSIS — Z4781 Encounter for orthopedic aftercare following surgical amputation: Secondary | ICD-10-CM | POA: Diagnosis not present

## 2015-04-15 DIAGNOSIS — N186 End stage renal disease: Secondary | ICD-10-CM | POA: Diagnosis not present

## 2015-04-15 DIAGNOSIS — I120 Hypertensive chronic kidney disease with stage 5 chronic kidney disease or end stage renal disease: Secondary | ICD-10-CM | POA: Diagnosis not present

## 2015-04-16 ENCOUNTER — Encounter: Payer: Self-pay | Admitting: Vascular Surgery

## 2015-04-16 DIAGNOSIS — E1129 Type 2 diabetes mellitus with other diabetic kidney complication: Secondary | ICD-10-CM | POA: Diagnosis not present

## 2015-04-16 DIAGNOSIS — N186 End stage renal disease: Secondary | ICD-10-CM | POA: Diagnosis not present

## 2015-04-16 DIAGNOSIS — D631 Anemia in chronic kidney disease: Secondary | ICD-10-CM | POA: Diagnosis not present

## 2015-04-16 DIAGNOSIS — Z992 Dependence on renal dialysis: Secondary | ICD-10-CM | POA: Diagnosis not present

## 2015-04-16 DIAGNOSIS — N2581 Secondary hyperparathyroidism of renal origin: Secondary | ICD-10-CM | POA: Diagnosis not present

## 2015-04-17 ENCOUNTER — Encounter: Payer: Self-pay | Admitting: Vascular Surgery

## 2015-04-17 ENCOUNTER — Ambulatory Visit (INDEPENDENT_AMBULATORY_CARE_PROVIDER_SITE_OTHER): Payer: Medicare Other | Admitting: Vascular Surgery

## 2015-04-17 VITALS — BP 146/60 | HR 73 | Temp 98.7°F | Resp 16 | Ht 64.0 in | Wt 130.0 lb

## 2015-04-17 DIAGNOSIS — I70269 Atherosclerosis of native arteries of extremities with gangrene, unspecified extremity: Secondary | ICD-10-CM | POA: Diagnosis not present

## 2015-04-17 NOTE — Progress Notes (Signed)
    Established Critical Limb Ischemia Patient  History of Present Illness  Garrett Salas is a 64 y.o. (March 08, 1951) male who presents with chief complaint: left foot wound.  Pt is s/p R BKA.  His L foot recently has developed gangrene.  The patient denies any rest pain.  He denies any fever or chills or drainage from his L foot.  Previous angiogram demonstrated a L PT target.  Vein mapping demonstrates no adequate vein in left leg for distal bypass.    The patient's PMH, PSH, SH, FamHx, Med, and Allergies are unchanged from 01/09/14.  On ROS today: no evidence of wet gangrene, no phantom pain in R BKA   Physical Examination  Filed Vitals:   04/17/15 1546 04/17/15 1547  BP: 154/74 146/60  Pulse: 73 73  Temp:  98.7 F (37.1 C)  Resp: 16   Height: 5\' 4"  (1.626 m)   Weight: 130 lb (58.968 kg)    Body mass index is 22.3 kg/(m^2).  General: A&O x 3, WDWN  Eyes: PERRLA, EOMI  Pulmonary: Sym exp, good air movt, CTAB, no rales, rhonchi, & wheezing  Cardiac: RRR, Nl S1, S2, no Murmurs, rubs or gallops  Vascular: Vessel Right Left  Radial Palpable Palpable  Brachial Palpable Palpable  Carotid Palpable, without bruit Palpable, without bruit  Aorta Not palpable N/A  Femoral Palpable Palpable  Popliteal Not palpable Not palpable  PT BKA Not Palpable  DP BKA Not Palpable   Gastrointestinal: soft, NTND, -G/R, - HSM, - masses, - CVAT B  Musculoskeletal: M/S 5/5 throughout BUE, LLE 4/5 (painful to manipulation), L 1st metatarsal-phalange joint ulcer, 2nd digit with distal phalange dry gangrene, 3rd and 4th toe DIP blistering, no smell, no drainage  Neurologic: Pain and light touch intact in extremities , Motor exam as listed above  Medical Decision Making  Garrett Salas is a 64 y.o. male who presents with: critical limb ischemia with non-salvageable toes   Pt doesn't have adequate vein conduit left for a L CFA to PT bypass.  Based on the patient's vascular studies and  examination, I have offered the patient: L BKA vs AKA..  The patient is going to consider his options: comfort measures vs palliative amputation.  Thank you for allowing [place] to participate in this patient's care.  Adele Barthel, MD [facility] [place] Office: [phone] Pager: [phone]  04/17/2015, 6:01 PM

## 2015-04-18 DIAGNOSIS — N2581 Secondary hyperparathyroidism of renal origin: Secondary | ICD-10-CM | POA: Diagnosis not present

## 2015-04-18 DIAGNOSIS — N186 End stage renal disease: Secondary | ICD-10-CM | POA: Diagnosis not present

## 2015-04-18 DIAGNOSIS — E1129 Type 2 diabetes mellitus with other diabetic kidney complication: Secondary | ICD-10-CM | POA: Diagnosis not present

## 2015-04-20 DIAGNOSIS — E1129 Type 2 diabetes mellitus with other diabetic kidney complication: Secondary | ICD-10-CM | POA: Diagnosis not present

## 2015-04-20 DIAGNOSIS — N186 End stage renal disease: Secondary | ICD-10-CM | POA: Diagnosis not present

## 2015-04-20 DIAGNOSIS — N2581 Secondary hyperparathyroidism of renal origin: Secondary | ICD-10-CM | POA: Diagnosis not present

## 2015-04-23 DIAGNOSIS — E1129 Type 2 diabetes mellitus with other diabetic kidney complication: Secondary | ICD-10-CM | POA: Diagnosis not present

## 2015-04-23 DIAGNOSIS — N2581 Secondary hyperparathyroidism of renal origin: Secondary | ICD-10-CM | POA: Diagnosis not present

## 2015-04-23 DIAGNOSIS — N186 End stage renal disease: Secondary | ICD-10-CM | POA: Diagnosis not present

## 2015-04-25 DIAGNOSIS — N186 End stage renal disease: Secondary | ICD-10-CM | POA: Diagnosis not present

## 2015-04-25 DIAGNOSIS — E1129 Type 2 diabetes mellitus with other diabetic kidney complication: Secondary | ICD-10-CM | POA: Diagnosis not present

## 2015-04-25 DIAGNOSIS — N2581 Secondary hyperparathyroidism of renal origin: Secondary | ICD-10-CM | POA: Diagnosis not present

## 2015-04-27 DIAGNOSIS — N186 End stage renal disease: Secondary | ICD-10-CM | POA: Diagnosis not present

## 2015-04-27 DIAGNOSIS — E1129 Type 2 diabetes mellitus with other diabetic kidney complication: Secondary | ICD-10-CM | POA: Diagnosis not present

## 2015-04-27 DIAGNOSIS — N2581 Secondary hyperparathyroidism of renal origin: Secondary | ICD-10-CM | POA: Diagnosis not present

## 2015-04-30 DIAGNOSIS — E1129 Type 2 diabetes mellitus with other diabetic kidney complication: Secondary | ICD-10-CM | POA: Diagnosis not present

## 2015-04-30 DIAGNOSIS — N186 End stage renal disease: Secondary | ICD-10-CM | POA: Diagnosis not present

## 2015-04-30 DIAGNOSIS — N2581 Secondary hyperparathyroidism of renal origin: Secondary | ICD-10-CM | POA: Diagnosis not present

## 2015-05-02 DIAGNOSIS — N2581 Secondary hyperparathyroidism of renal origin: Secondary | ICD-10-CM | POA: Diagnosis not present

## 2015-05-02 DIAGNOSIS — E1129 Type 2 diabetes mellitus with other diabetic kidney complication: Secondary | ICD-10-CM | POA: Diagnosis not present

## 2015-05-02 DIAGNOSIS — N186 End stage renal disease: Secondary | ICD-10-CM | POA: Diagnosis not present

## 2015-05-04 DIAGNOSIS — N2581 Secondary hyperparathyroidism of renal origin: Secondary | ICD-10-CM | POA: Diagnosis not present

## 2015-05-04 DIAGNOSIS — E1129 Type 2 diabetes mellitus with other diabetic kidney complication: Secondary | ICD-10-CM | POA: Diagnosis not present

## 2015-05-04 DIAGNOSIS — N186 End stage renal disease: Secondary | ICD-10-CM | POA: Diagnosis not present

## 2015-05-07 DIAGNOSIS — N186 End stage renal disease: Secondary | ICD-10-CM | POA: Diagnosis not present

## 2015-05-07 DIAGNOSIS — N2581 Secondary hyperparathyroidism of renal origin: Secondary | ICD-10-CM | POA: Diagnosis not present

## 2015-05-07 DIAGNOSIS — E1129 Type 2 diabetes mellitus with other diabetic kidney complication: Secondary | ICD-10-CM | POA: Diagnosis not present

## 2015-05-09 DIAGNOSIS — E1129 Type 2 diabetes mellitus with other diabetic kidney complication: Secondary | ICD-10-CM | POA: Diagnosis not present

## 2015-05-09 DIAGNOSIS — N186 End stage renal disease: Secondary | ICD-10-CM | POA: Diagnosis not present

## 2015-05-09 DIAGNOSIS — N2581 Secondary hyperparathyroidism of renal origin: Secondary | ICD-10-CM | POA: Diagnosis not present

## 2015-05-11 DIAGNOSIS — N186 End stage renal disease: Secondary | ICD-10-CM | POA: Diagnosis not present

## 2015-05-11 DIAGNOSIS — E1129 Type 2 diabetes mellitus with other diabetic kidney complication: Secondary | ICD-10-CM | POA: Diagnosis not present

## 2015-05-11 DIAGNOSIS — N2581 Secondary hyperparathyroidism of renal origin: Secondary | ICD-10-CM | POA: Diagnosis not present

## 2015-05-13 ENCOUNTER — Other Ambulatory Visit: Payer: Self-pay

## 2015-05-14 ENCOUNTER — Encounter (HOSPITAL_COMMUNITY): Payer: Self-pay | Admitting: General Practice

## 2015-05-14 ENCOUNTER — Inpatient Hospital Stay (HOSPITAL_COMMUNITY): Payer: Medicare Other

## 2015-05-14 ENCOUNTER — Inpatient Hospital Stay (HOSPITAL_COMMUNITY)
Admission: EM | Admit: 2015-05-14 | Discharge: 2015-05-24 | DRG: 239 | Disposition: A | Discharge status: Skilled Nursing Facility | Payer: Medicare Other | Attending: Family Medicine | Admitting: Family Medicine

## 2015-05-14 ENCOUNTER — Emergency Department (HOSPITAL_COMMUNITY): Payer: Medicare Other

## 2015-05-14 DIAGNOSIS — Z794 Long term (current) use of insulin: Secondary | ICD-10-CM

## 2015-05-14 DIAGNOSIS — I160 Hypertensive urgency: Secondary | ICD-10-CM | POA: Diagnosis present

## 2015-05-14 DIAGNOSIS — F17210 Nicotine dependence, cigarettes, uncomplicated: Secondary | ICD-10-CM | POA: Diagnosis present

## 2015-05-14 DIAGNOSIS — R410 Disorientation, unspecified: Secondary | ICD-10-CM

## 2015-05-14 DIAGNOSIS — E1121 Type 2 diabetes mellitus with diabetic nephropathy: Secondary | ICD-10-CM | POA: Diagnosis present

## 2015-05-14 DIAGNOSIS — Z419 Encounter for procedure for purposes other than remedying health state, unspecified: Secondary | ICD-10-CM

## 2015-05-14 DIAGNOSIS — E1122 Type 2 diabetes mellitus with diabetic chronic kidney disease: Secondary | ICD-10-CM | POA: Diagnosis present

## 2015-05-14 DIAGNOSIS — I10 Essential (primary) hypertension: Secondary | ICD-10-CM | POA: Diagnosis present

## 2015-05-14 DIAGNOSIS — E11319 Type 2 diabetes mellitus with unspecified diabetic retinopathy without macular edema: Secondary | ICD-10-CM | POA: Diagnosis present

## 2015-05-14 DIAGNOSIS — R0602 Shortness of breath: Secondary | ICD-10-CM | POA: Diagnosis not present

## 2015-05-14 DIAGNOSIS — Z89511 Acquired absence of right leg below knee: Secondary | ICD-10-CM | POA: Diagnosis not present

## 2015-05-14 DIAGNOSIS — E1159 Type 2 diabetes mellitus with other circulatory complications: Secondary | ICD-10-CM

## 2015-05-14 DIAGNOSIS — Z833 Family history of diabetes mellitus: Secondary | ICD-10-CM

## 2015-05-14 DIAGNOSIS — N2581 Secondary hyperparathyroidism of renal origin: Secondary | ICD-10-CM | POA: Diagnosis present

## 2015-05-14 DIAGNOSIS — E1142 Type 2 diabetes mellitus with diabetic polyneuropathy: Secondary | ICD-10-CM | POA: Diagnosis present

## 2015-05-14 DIAGNOSIS — I151 Hypertension secondary to other renal disorders: Secondary | ICD-10-CM | POA: Diagnosis not present

## 2015-05-14 DIAGNOSIS — Z9119 Patient's noncompliance with other medical treatment and regimen: Secondary | ICD-10-CM | POA: Diagnosis present

## 2015-05-14 DIAGNOSIS — I129 Hypertensive chronic kidney disease with stage 1 through stage 4 chronic kidney disease, or unspecified chronic kidney disease: Secondary | ICD-10-CM | POA: Diagnosis not present

## 2015-05-14 DIAGNOSIS — G92 Toxic encephalopathy: Secondary | ICD-10-CM | POA: Diagnosis not present

## 2015-05-14 DIAGNOSIS — Z7982 Long term (current) use of aspirin: Secondary | ICD-10-CM | POA: Diagnosis not present

## 2015-05-14 DIAGNOSIS — I998 Other disorder of circulatory system: Secondary | ICD-10-CM | POA: Diagnosis not present

## 2015-05-14 DIAGNOSIS — I119 Hypertensive heart disease without heart failure: Secondary | ICD-10-CM | POA: Diagnosis present

## 2015-05-14 DIAGNOSIS — N2889 Other specified disorders of kidney and ureter: Secondary | ICD-10-CM

## 2015-05-14 DIAGNOSIS — N186 End stage renal disease: Secondary | ICD-10-CM | POA: Diagnosis present

## 2015-05-14 DIAGNOSIS — R509 Fever, unspecified: Secondary | ICD-10-CM | POA: Diagnosis not present

## 2015-05-14 DIAGNOSIS — E1165 Type 2 diabetes mellitus with hyperglycemia: Secondary | ICD-10-CM | POA: Diagnosis present

## 2015-05-14 DIAGNOSIS — T402X5A Adverse effect of other opioids, initial encounter: Secondary | ICD-10-CM | POA: Diagnosis not present

## 2015-05-14 DIAGNOSIS — R531 Weakness: Secondary | ICD-10-CM | POA: Diagnosis not present

## 2015-05-14 DIAGNOSIS — I214 Non-ST elevation (NSTEMI) myocardial infarction: Secondary | ICD-10-CM | POA: Diagnosis not present

## 2015-05-14 DIAGNOSIS — E1129 Type 2 diabetes mellitus with other diabetic kidney complication: Secondary | ICD-10-CM | POA: Diagnosis not present

## 2015-05-14 DIAGNOSIS — F1290 Cannabis use, unspecified, uncomplicated: Secondary | ICD-10-CM | POA: Diagnosis present

## 2015-05-14 DIAGNOSIS — E785 Hyperlipidemia, unspecified: Secondary | ICD-10-CM | POA: Diagnosis present

## 2015-05-14 DIAGNOSIS — E1140 Type 2 diabetes mellitus with diabetic neuropathy, unspecified: Secondary | ICD-10-CM | POA: Diagnosis present

## 2015-05-14 DIAGNOSIS — Z992 Dependence on renal dialysis: Secondary | ICD-10-CM | POA: Diagnosis not present

## 2015-05-14 DIAGNOSIS — R262 Difficulty in walking, not elsewhere classified: Secondary | ICD-10-CM | POA: Diagnosis not present

## 2015-05-14 DIAGNOSIS — D62 Acute posthemorrhagic anemia: Secondary | ICD-10-CM | POA: Diagnosis not present

## 2015-05-14 DIAGNOSIS — G9340 Encephalopathy, unspecified: Secondary | ICD-10-CM | POA: Diagnosis not present

## 2015-05-14 DIAGNOSIS — Z681 Body mass index (BMI) 19 or less, adult: Secondary | ICD-10-CM | POA: Diagnosis not present

## 2015-05-14 DIAGNOSIS — I70262 Atherosclerosis of native arteries of extremities with gangrene, left leg: Secondary | ICD-10-CM | POA: Diagnosis present

## 2015-05-14 DIAGNOSIS — D631 Anemia in chronic kidney disease: Secondary | ICD-10-CM | POA: Diagnosis present

## 2015-05-14 DIAGNOSIS — E1152 Type 2 diabetes mellitus with diabetic peripheral angiopathy with gangrene: Principal | ICD-10-CM | POA: Diagnosis present

## 2015-05-14 DIAGNOSIS — I248 Other forms of acute ischemic heart disease: Secondary | ICD-10-CM | POA: Diagnosis not present

## 2015-05-14 DIAGNOSIS — H548 Legal blindness, as defined in USA: Secondary | ICD-10-CM | POA: Diagnosis present

## 2015-05-14 DIAGNOSIS — K219 Gastro-esophageal reflux disease without esophagitis: Secondary | ICD-10-CM | POA: Diagnosis not present

## 2015-05-14 DIAGNOSIS — E43 Unspecified severe protein-calorie malnutrition: Secondary | ICD-10-CM | POA: Diagnosis not present

## 2015-05-14 DIAGNOSIS — M6281 Muscle weakness (generalized): Secondary | ICD-10-CM | POA: Diagnosis not present

## 2015-05-14 DIAGNOSIS — K3184 Gastroparesis: Secondary | ICD-10-CM | POA: Diagnosis present

## 2015-05-14 DIAGNOSIS — G912 (Idiopathic) normal pressure hydrocephalus: Secondary | ICD-10-CM | POA: Diagnosis present

## 2015-05-14 DIAGNOSIS — R600 Localized edema: Secondary | ICD-10-CM | POA: Diagnosis not present

## 2015-05-14 DIAGNOSIS — R7989 Other specified abnormal findings of blood chemistry: Secondary | ICD-10-CM | POA: Diagnosis not present

## 2015-05-14 DIAGNOSIS — R404 Transient alteration of awareness: Secondary | ICD-10-CM | POA: Diagnosis not present

## 2015-05-14 DIAGNOSIS — I739 Peripheral vascular disease, unspecified: Secondary | ICD-10-CM | POA: Diagnosis not present

## 2015-05-14 DIAGNOSIS — K5900 Constipation, unspecified: Secondary | ICD-10-CM | POA: Diagnosis not present

## 2015-05-14 DIAGNOSIS — I96 Gangrene, not elsewhere classified: Secondary | ICD-10-CM | POA: Diagnosis not present

## 2015-05-14 DIAGNOSIS — I1311 Hypertensive heart and chronic kidney disease without heart failure, with stage 5 chronic kidney disease, or end stage renal disease: Secondary | ICD-10-CM | POA: Diagnosis present

## 2015-05-14 DIAGNOSIS — R0902 Hypoxemia: Secondary | ICD-10-CM | POA: Diagnosis present

## 2015-05-14 DIAGNOSIS — R4182 Altered mental status, unspecified: Secondary | ICD-10-CM | POA: Diagnosis not present

## 2015-05-14 DIAGNOSIS — G4700 Insomnia, unspecified: Secondary | ICD-10-CM | POA: Diagnosis not present

## 2015-05-14 DIAGNOSIS — Z89512 Acquired absence of left leg below knee: Secondary | ICD-10-CM | POA: Diagnosis not present

## 2015-05-14 DIAGNOSIS — I120 Hypertensive chronic kidney disease with stage 5 chronic kidney disease or end stage renal disease: Secondary | ICD-10-CM | POA: Diagnosis not present

## 2015-05-14 DIAGNOSIS — R778 Other specified abnormalities of plasma proteins: Secondary | ICD-10-CM | POA: Diagnosis present

## 2015-05-14 DIAGNOSIS — R279 Unspecified lack of coordination: Secondary | ICD-10-CM | POA: Diagnosis not present

## 2015-05-14 DIAGNOSIS — I639 Cerebral infarction, unspecified: Secondary | ICD-10-CM

## 2015-05-14 DIAGNOSIS — E1151 Type 2 diabetes mellitus with diabetic peripheral angiopathy without gangrene: Secondary | ICD-10-CM | POA: Diagnosis present

## 2015-05-14 DIAGNOSIS — I70229 Atherosclerosis of native arteries of extremities with rest pain, unspecified extremity: Secondary | ICD-10-CM | POA: Diagnosis present

## 2015-05-14 HISTORY — DX: Acquired absence of right leg below knee: Z89.511

## 2015-05-14 LAB — BASIC METABOLIC PANEL
Anion gap: 20 — ABNORMAL HIGH (ref 5–15)
BUN: 64 mg/dL — AB (ref 6–20)
CO2: 26 mmol/L (ref 22–32)
CREATININE: 10.13 mg/dL — AB (ref 0.61–1.24)
Calcium: 8.4 mg/dL — ABNORMAL LOW (ref 8.9–10.3)
Chloride: 92 mmol/L — ABNORMAL LOW (ref 101–111)
GFR calc Af Amer: 6 mL/min — ABNORMAL LOW (ref >60)
GFR calc non Af Amer: 5 mL/min — ABNORMAL LOW (ref >60)
Glucose, Bld: 176 mg/dL — ABNORMAL HIGH (ref 65–99)
Potassium: 4.5 mmol/L (ref 3.5–5.1)
Sodium: 138 mmol/L (ref 135–145)

## 2015-05-14 LAB — CBC WITH DIFFERENTIAL/PLATELET
Basophils Absolute: 0 10*3/uL (ref 0.0–0.1)
Basophils Relative: 0 % (ref 0–1)
EOS PCT: 0 % (ref 0–5)
Eosinophils Absolute: 0 10*3/uL (ref 0.0–0.7)
HCT: 30.2 % — ABNORMAL LOW (ref 39.0–52.0)
Hemoglobin: 10 g/dL — ABNORMAL LOW (ref 13.0–17.0)
LYMPHS PCT: 7 % — AB (ref 12–46)
Lymphs Abs: 0.9 10*3/uL (ref 0.7–4.0)
MCH: 31.3 pg (ref 26.0–34.0)
MCHC: 33.1 g/dL (ref 30.0–36.0)
MCV: 94.4 fL (ref 78.0–100.0)
Monocytes Absolute: 0.9 10*3/uL (ref 0.1–1.0)
Monocytes Relative: 6 % (ref 3–12)
NEUTROS ABS: 12.4 10*3/uL — AB (ref 1.7–7.7)
Neutrophils Relative %: 87 % — ABNORMAL HIGH (ref 43–77)
PLATELETS: 160 10*3/uL (ref 150–400)
RBC: 3.2 MIL/uL — ABNORMAL LOW (ref 4.22–5.81)
RDW: 15.2 % (ref 11.5–15.5)
WBC: 14.3 10*3/uL — ABNORMAL HIGH (ref 4.0–10.5)

## 2015-05-14 LAB — I-STAT CHEM 8, ED
BUN: 58 mg/dL — ABNORMAL HIGH (ref 6–20)
CALCIUM ION: 0.88 mmol/L — AB (ref 1.13–1.30)
CREATININE: 10.1 mg/dL — AB (ref 0.61–1.24)
Chloride: 96 mmol/L — ABNORMAL LOW (ref 101–111)
Glucose, Bld: 176 mg/dL — ABNORMAL HIGH (ref 65–99)
HCT: 32 % — ABNORMAL LOW (ref 39.0–52.0)
Hemoglobin: 10.9 g/dL — ABNORMAL LOW (ref 13.0–17.0)
Potassium: 4.4 mmol/L (ref 3.5–5.1)
Sodium: 133 mmol/L — ABNORMAL LOW (ref 135–145)
TCO2: 27 mmol/L (ref 0–100)

## 2015-05-14 LAB — GLUCOSE, CAPILLARY
Glucose-Capillary: 152 mg/dL — ABNORMAL HIGH (ref 65–99)
Glucose-Capillary: 153 mg/dL — ABNORMAL HIGH (ref 65–99)

## 2015-05-14 LAB — I-STAT TROPONIN, ED: Troponin i, poc: 0.85 ng/mL (ref 0.00–0.08)

## 2015-05-14 LAB — TROPONIN I
Troponin I: 1.17 ng/mL (ref <0.031)
Troponin I: 1.05 ng/mL (ref <0.031)
Troponin I: 0.86 ng/mL (ref <0.031)

## 2015-05-14 MED ORDER — PIPERACILLIN-TAZOBACTAM IN DEX 2-0.25 GM/50ML IV SOLN
2.2500 g | Freq: Three times a day (TID) | INTRAVENOUS | Status: DC
  Administered 2015-05-15 – 2015-05-18 (×9): 2.25 g via INTRAVENOUS
  Filled 2015-05-14 (×14): qty 50

## 2015-05-14 MED ORDER — ASPIRIN EC 81 MG PO TBEC
81.0000 mg | DELAYED_RELEASE_TABLET | Freq: Every day | ORAL | Status: DC
  Administered 2015-05-15 – 2015-05-24 (×9): 81 mg via ORAL
  Filled 2015-05-14 (×9): qty 1

## 2015-05-14 MED ORDER — AMPHETAMINE-DEXTROAMPHETAMINE 10 MG PO TABS
5.0000 mg | ORAL_TABLET | Freq: Every day | ORAL | Status: DC
  Administered 2015-05-15 – 2015-05-22 (×7): 5 mg via ORAL
  Filled 2015-05-14 (×7): qty 1

## 2015-05-14 MED ORDER — INSULIN GLARGINE 100 UNIT/ML ~~LOC~~ SOLN
25.0000 [IU] | Freq: Every day | SUBCUTANEOUS | Status: DC
  Administered 2015-05-14 – 2015-05-15 (×2): 25 [IU] via SUBCUTANEOUS
  Filled 2015-05-14 (×3): qty 0.25

## 2015-05-14 MED ORDER — BISACODYL 5 MG PO TBEC
10.0000 mg | DELAYED_RELEASE_TABLET | Freq: Once | ORAL | Status: AC
  Administered 2015-05-15: 10 mg via ORAL
  Filled 2015-05-14 (×2): qty 2

## 2015-05-14 MED ORDER — INSULIN ASPART 100 UNIT/ML ~~LOC~~ SOLN
0.0000 [IU] | Freq: Three times a day (TID) | SUBCUTANEOUS | Status: DC
  Administered 2015-05-15: 2 [IU] via SUBCUTANEOUS
  Administered 2015-05-20 (×2): 1 [IU] via SUBCUTANEOUS
  Administered 2015-05-20 – 2015-05-21 (×2): 2 [IU] via SUBCUTANEOUS
  Administered 2015-05-22 (×2): 1 [IU] via SUBCUTANEOUS
  Administered 2015-05-23 – 2015-05-24 (×2): 2 [IU] via SUBCUTANEOUS

## 2015-05-14 MED ORDER — HYDRALAZINE HCL 20 MG/ML IJ SOLN
10.0000 mg | Freq: Four times a day (QID) | INTRAMUSCULAR | Status: DC | PRN
  Administered 2015-05-14 – 2015-05-21 (×7): 10 mg via INTRAVENOUS
  Filled 2015-05-14 (×3): qty 1
  Filled 2015-05-14: qty 0.5
  Filled 2015-05-14 (×5): qty 1

## 2015-05-14 MED ORDER — METOCLOPRAMIDE HCL 5 MG PO TABS
5.0000 mg | ORAL_TABLET | Freq: Three times a day (TID) | ORAL | Status: DC
  Administered 2015-05-14 – 2015-05-22 (×26): 5 mg via ORAL
  Filled 2015-05-14 (×25): qty 1

## 2015-05-14 MED ORDER — SODIUM CHLORIDE 0.9 % IJ SOLN
3.0000 mL | Freq: Two times a day (BID) | INTRAMUSCULAR | Status: DC
  Administered 2015-05-15 (×2): 3 mL via INTRAVENOUS

## 2015-05-14 MED ORDER — VANCOMYCIN HCL IN DEXTROSE 750-5 MG/150ML-% IV SOLN: 750.0000 mg | INTRAVENOUS | Status: DC

## 2015-05-14 MED ORDER — ACETAMINOPHEN 325 MG PO TABS
650.0000 mg | ORAL_TABLET | Freq: Four times a day (QID) | ORAL | Status: DC | PRN
  Administered 2015-05-15 – 2015-05-21 (×2): 650 mg via ORAL
  Filled 2015-05-14: qty 2

## 2015-05-14 MED ORDER — HYDROCODONE-ACETAMINOPHEN 10-325 MG PO TABS
1.0000 | ORAL_TABLET | Freq: Four times a day (QID) | ORAL | Status: DC | PRN
  Administered 2015-05-14 – 2015-05-16 (×4): 1 via ORAL
  Filled 2015-05-14 (×4): qty 1

## 2015-05-14 MED ORDER — SODIUM CHLORIDE 0.9 % IJ SOLN: 3.0000 mL | INTRAMUSCULAR | Status: DC | PRN

## 2015-05-14 MED ORDER — SIMVASTATIN 40 MG PO TABS
40.0000 mg | ORAL_TABLET | Freq: Every day | ORAL | Status: DC
  Administered 2015-05-15 – 2015-05-20 (×6): 40 mg via ORAL
  Filled 2015-05-14 (×6): qty 1

## 2015-05-14 MED ORDER — DARBEPOETIN ALFA 60 MCG/0.3ML IJ SOSY
60.0000 ug | PREFILLED_SYRINGE | INTRAMUSCULAR | Status: DC
  Administered 2015-05-23: 60 ug via INTRAVENOUS
  Filled 2015-05-14 (×2): qty 0.3

## 2015-05-14 MED ORDER — VANCOMYCIN HCL 1000 MG IV SOLR
1500.0000 mg | INTRAVENOUS | Status: AC
Start: 2015-05-14 — End: 2015-05-14
  Administered 2015-05-14: 1500 mg via INTRAVENOUS
  Filled 2015-05-14: qty 1500

## 2015-05-14 MED ORDER — IPRATROPIUM-ALBUTEROL 0.5-2.5 (3) MG/3ML IN SOLN
3.0000 mL | Freq: Four times a day (QID) | RESPIRATORY_TRACT | Status: DC
Start: 2015-05-14 — End: 2015-05-14

## 2015-05-14 MED ORDER — ACETAMINOPHEN 650 MG RE SUPP
650.0000 mg | Freq: Four times a day (QID) | RECTAL | Status: DC | PRN
  Filled 2015-05-14 (×2): qty 1

## 2015-05-14 MED ORDER — SEVELAMER CARBONATE 800 MG PO TABS
3200.0000 mg | ORAL_TABLET | Freq: Three times a day (TID) | ORAL | Status: DC
  Administered 2015-05-15 – 2015-05-24 (×23): 3200 mg via ORAL
  Filled 2015-05-14 (×25): qty 4

## 2015-05-14 MED ORDER — SODIUM CHLORIDE 0.9 % IJ SOLN
3.0000 mL | Freq: Two times a day (BID) | INTRAMUSCULAR | Status: DC
Start: 2015-05-14 — End: 2015-05-24
  Administered 2015-05-14 – 2015-05-24 (×13): 3 mL via INTRAVENOUS

## 2015-05-14 MED ORDER — ASPIRIN 81 MG PO CHEW
324.0000 mg | CHEWABLE_TABLET | Freq: Once | ORAL | Status: AC
  Administered 2015-05-14: 324 mg via ORAL
  Filled 2015-05-14: qty 4

## 2015-05-14 MED ORDER — SODIUM CHLORIDE 0.9 % IV SOLN: 250.0000 mL | INTRAVENOUS | Status: DC | PRN

## 2015-05-14 MED ORDER — CALCITRIOL 0.5 MCG PO CAPS
0.5000 ug | ORAL_CAPSULE | ORAL | Status: DC
  Administered 2015-05-16 – 2015-05-23 (×3): 0.5 ug via ORAL
  Filled 2015-05-14 (×4): qty 1

## 2015-05-14 MED ORDER — IPRATROPIUM-ALBUTEROL 0.5-2.5 (3) MG/3ML IN SOLN: 3.0000 mL | Freq: Four times a day (QID) | RESPIRATORY_TRACT | Status: DC | PRN

## 2015-05-14 MED ORDER — SODIUM CHLORIDE 0.9 % IV SOLN
62.5000 mg | INTRAVENOUS | Status: AC
  Administered 2015-05-16 – 2015-05-23 (×4): 62.5 mg via INTRAVENOUS
  Filled 2015-05-14 (×9): qty 5

## 2015-05-14 MED ORDER — INSULIN GLARGINE 100 UNITS/ML SOLOSTAR PEN: 25.0000 [IU] | PEN_INJECTOR | Freq: Every day | SUBCUTANEOUS | Status: DC

## 2015-05-14 MED ORDER — FAMOTIDINE 20 MG PO TABS
20.0000 mg | ORAL_TABLET | Freq: Every day | ORAL | Status: DC
  Administered 2015-05-15 – 2015-05-24 (×9): 20 mg via ORAL
  Filled 2015-05-14 (×9): qty 1

## 2015-05-14 MED ORDER — HEPARIN SODIUM (PORCINE) 5000 UNIT/ML IJ SOLN
5000.0000 [IU] | Freq: Three times a day (TID) | INTRAMUSCULAR | Status: DC
  Administered 2015-05-14 – 2015-05-24 (×21): 5000 [IU] via SUBCUTANEOUS
  Filled 2015-05-14 (×24): qty 1

## 2015-05-14 MED ORDER — LISINOPRIL 20 MG PO TABS
20.0000 mg | ORAL_TABLET | Freq: Every day | ORAL | Status: DC
  Administered 2015-05-15 – 2015-05-18 (×3): 20 mg via ORAL
  Filled 2015-05-14 (×3): qty 1

## 2015-05-14 MED ORDER — DOCUSATE SODIUM 100 MG PO CAPS
100.0000 mg | ORAL_CAPSULE | Freq: Two times a day (BID) | ORAL | Status: DC
  Administered 2015-05-14 – 2015-05-24 (×17): 100 mg via ORAL
  Filled 2015-05-14 (×20): qty 1

## 2015-05-14 NOTE — ED Notes (Signed)
Pt brought in via [facility]. Pts family called EMS reporting pt "not acting normally" and weakness that started today. Family also reporting pt not eating or drinking much the past couple of days. Pt is A/O, and neurologically intact. Pt has a history of HTN, DM, HLD, and HD. Pt is noncompliant with his blood pressure medication. Pt goes to HD on Tuesday, Thursdays, and Saturdays.

## 2015-05-14 NOTE — Progress Notes (Signed)
ANTIBIOTIC CONSULT NOTE - INITIAL  Pharmacy Consult for vancomycin and zosyn Indication: left foot gangrene  No Known Allergies  Patient Measurements: Height: 5\' 4"  (162.6 cm) IBW/kg (Calculated) : 59.2 Adjusted Body Weight:   Vital Signs: Temp: 98.5 F (36.9 C) (06/28 1102) Temp Source: Oral (06/28 1102) BP: 205/92 mmHg (06/28 1515) Pulse Rate: 79 (06/28 1515) Intake/Output from previous day:   Intake/Output from this shift:    Labs:  Recent Labs  05/14/15 1122 05/14/15 1128  WBC 14.3*  --   HGB 10.0* 10.9*  PLT 160  --   CREATININE 10.13* 10.10*   CrCl cannot be calculated (Unknown ideal weight.). No results for input(s): VANCOTROUGH, VANCOPEAK, VANCORANDOM, GENTTROUGH, GENTPEAK, GENTRANDOM, TOBRATROUGH, TOBRAPEAK, TOBRARND, AMIKACINPEAK, AMIKACINTROU, AMIKACIN in the last 72 hours.   Microbiology: Recent Results (from the past 720 hour(s))  Culture, blood (routine x 2)     Status: None (Preliminary result)   Collection Time: 05/14/15 11:22 AM  Result Value Ref Range Status   Specimen Description BLOOD LEFT ANTECUBITAL  Final   Special Requests BOTTLES DRAWN AEROBIC ONLY 3CC  Final   Culture PENDING  Incomplete   Report Status PENDING  Incomplete    Medical History: Past Medical History  Diagnosis Date  . Diabetes mellitus     Type 2. Diagnosed in mid 62s. Currently insulin requiring  . Hyperlipidemia   . ESRD (end stage renal disease) on dialysis     due to diabetic nephropathy. T/Th/Sat  . Tobacco abuse   . Diabetic neuropathy   . Diabetic retinopathy     legally blind  . Hypertension   . Dialysis patient   . Peripheral vascular disease   . Hx of right BKA     Medications:  Scheduled:  . amphetamine-dextroamphetamine  5 mg Oral Daily  . aspirin EC  81 mg Oral Daily  . bisacodyl  10 mg Oral Once  . [START ON 05/16/2015] calcitRIOL  0.5 mcg Oral Q T,Th,Sa-HD  . [START ON 05/16/2015] darbepoetin (ARANESP) injection - DIALYSIS  60 mcg Intravenous  Q Thu-HD  . docusate sodium  100 mg Oral BID  . famotidine  20 mg Oral Daily  . [START ON 05/16/2015] ferric gluconate (FERRLECIT/NULECIT) IV  62.5 mg Intravenous Q T,Th,Sa-HD  . heparin  5,000 Units Subcutaneous 3 times per day  . insulin aspart  0-9 Units Subcutaneous TID WC  . insulin glargine  25 Units Subcutaneous QHS  . lisinopril  20 mg Oral Daily  . metoCLOPramide  5 mg Oral TID AC & HS  . sevelamer carbonate  3,200 mg Oral TID WC  . simvastatin  40 mg Oral Daily  . sodium chloride  3 mL Intravenous Q12H  . sodium chloride  3 mL Intravenous Q12H   Infusions:   Assessment: 64 yo male left foot gangrene will be started on vancomycin and zosyn.  Patient has end-stage renal disease on HD TThSa  Goal of Therapy:  pre-HD vanc level 15-25 mcg/mL  Plan:  - zosyn 2.25 g iv q8h - vancomycin 1500 mg iv x1 with HD today, then vancomycin 750 mg iv qHD (TThSa)  Jerusalem Wert, Tsz-Yin 05/14/2015,4:01 PM

## 2015-05-14 NOTE — Consult Note (Signed)
CARDIOLOGY CONSULT NOTE   Patient ID: Garrett Salas MRN: [id number] DOB/AGE: [birth year] 64 y.o.  Admit date: 05/14/2015  Primary Physician   [name], MD Primary Cardiologist  Dr. Fletcher Anon (last seen in 2012) Reason for Consultation   Elevated troponin  HPI: Garrett Salas is a 64 y.o. male with a history of HTN, HL, DM with neuropathy, peripheral vascular disease s/p right BKA, ESRD on HD (Tuesday, Thursday,Saturday), who brought by EMS to the ED for family reported AMS.  He was evaluated for dyspnea and chest pain iwith an abnormal nuclear stress test (2012) which showed an ejection fraction of 39% without significant perfusion defects. F/u LHC in 2012 which revealed mild luminal irregularities without any evidence of obstructive coronary artery disease and normal LVF. Echo 12/2014 showed LV EF of 55-60%; severe LVH; no WM abnormality; grade 2 DD; mild aortic reg. He dose not follow up with any cardiologist regularly. PT had a discussion about left BKA vs AKA with Dr. Bridgett Larsson 04/17/15. Plan for amputation on 05/28/15.  Patient states that my Nursing Aid said to my family that I was "not acting right" and they called EMS.  Patient was hypoxic on EMS arrival, improved with 2L supplemental O2 via nasal cannula. He does not use home O2. Currently he is feeling good and wants to eat and go to home. He denies chest pain, SOB, palpation, orthopnea, PND, headache, fall, syncope, nausea or vomiting. His last dialysis session was 3 days ago. He did not stay for the entire session. EKG NSR at rate of 87; TWI in lead V2 and V3. POc trop 0.85, trop I 1.17. K normal, Cr of 10.10. CXR without acute abnormality.     Past Medical History  Diagnosis Date  . Diabetes mellitus     Type 2. Diagnosed in mid 73s. Currently insulin requiring  . Hyperlipidemia   . ESRD (end stage renal disease) on dialysis     due to diabetic nephropathy. T/Th/Sat  . Tobacco abuse   . Diabetic neuropathy   . Diabetic  retinopathy     legally blind  . Hypertension   . Dialysis patient   . Peripheral vascular disease   . Hx of right BKA      Past Surgical History  Procedure Laterality Date  . None    . Cardiac catheterization  05/06/2011    no significant CAD, EF 55-60%, LVEDP :15   . Av fistula placement    . Abdominal aortagram N/A 01/07/2015    Procedure: ABDOMINAL Maxcine Ham;  Surgeon: Conrad [place], MD;  Location: [facility] CATH LAB;  Service: Cardiovascular;  Laterality: N/A;  . Amputation Right 01/09/2015    Procedure: AMPUTATION BELOW KNEE RIGHT LEG;  Surgeon: Conrad Nisswa, MD;  Location: [place];  Service: Vascular;  Laterality: Right;    No Known Allergies  I have reviewed the patient's current medications  Prior to Admission medications   Medication Sig Start Date End Date Taking? Authorizing Provider  ACCU-CHEK AVIVA PLUS test strip  01/17/15  Yes Historical Provider, MD  ACCU-CHEK SOFTCLIX LANCETS lancets  01/16/15  Yes Historical Provider, MD  albuterol (PROVENTIL HFA;VENTOLIN HFA) 108 (90 BASE) MCG/ACT inhaler Inhale 2 puffs into the lungs every 6 (six) hours as needed for wheezing or shortness of breath.   Yes Historical Provider, MD  amphetamine-dextroamphetamine (ADDERALL) 5 MG tablet Take 5 mg by mouth daily.  11/21/14  Yes Historical Provider, MD  aspirin EC 81 MG tablet Take 81 mg by mouth daily.  Yes Historical Provider, MD  B-D UF III MINI PEN NEEDLES 31G X 5 MM MISC  01/21/15  Yes Historical Provider, MD  Blood Glucose Monitoring Suppl (ACCU-CHEK AVIVA PLUS) W/DEVICE KIT  01/16/15  Yes Historical Provider, MD  HUMULIN 70/30 KWIKPEN (70-30) 100 UNIT/ML PEN Inject 5-20 Units into the skin 2 (two) times daily as needed (CBG over 175).  11/27/14  Yes Historical Provider, MD  HYDROcodone-acetaminophen (NORCO) 10-325 MG per tablet Take 1 tablet by mouth every 6 (six) hours as needed. 01/14/15  Yes Kimberly A Trinh, PA-C  insulin glargine (LANTUS) 100 unit/mL SOPN Inject 25 Units into the skin at  bedtime.   Yes Historical Provider, MD  lisinopril (PRINIVIL,ZESTRIL) 20 MG tablet Take 20 mg by mouth daily.   Yes Historical Provider, MD  metoCLOPramide (REGLAN) 5 MG tablet Take 5 mg by mouth 4 (four) times daily -  before meals and at bedtime.   Yes Historical Provider, MD  pregabalin (LYRICA) 75 MG capsule Take 75 mg by mouth 3 (three) times daily.   Yes Historical Provider, MD  ranitidine (ZANTAC) 150 MG tablet Take 150 mg by mouth daily.  01/24/14  Yes Historical Provider, MD  sevelamer (RENVELA) 800 MG tablet Take 3,200 mg by mouth 3 (three) times daily with meals.    Yes Historical Provider, MD  silver sulfADIAZINE (SILVADENE) 1 % cream Apply 1 application topically daily. 03/29/15  Yes Bronson Ing, DPM  ULTICARE INSULIN SYRINGE 29G X 1/2" 0.5 ML MISC  04/08/15  Yes Historical Provider, MD  cephALEXin (KEFLEX) 500 MG capsule Take 1 capsule (500 mg total) by mouth 3 (three) times daily. Patient not taking: Reported on 05/14/2015 04/11/15   Bronson Ing, DPM  oxyCODONE (OXY IR/ROXICODONE) 5 MG immediate release tablet Take 1 tablet (5 mg total) by mouth every 8 (eight) hours as needed for severe pain. Patient not taking: Reported on 04/17/2015 01/04/15   Conrad Clio, MD  simvastatin (ZOCOR) 40 MG tablet Take 40 mg by mouth daily.     Historical Provider, MD     History   Social History  . Marital Status: Married    Spouse Name: N/A  . Number of Children: 1  . Years of Education: N/A   Occupational History  . retired    Social History Main Topics  . Smoking status: Current Every Day Smoker -- 1.00 packs/day for 30 years    Types: Cigarettes    Last Attempt to Quit: 01/09/2015  . Smokeless tobacco: Never Used  . Alcohol Use: No  . Drug Use: Yes    Special: Marijuana  . Sexual Activity: Not on file   Other Topics Concern  . Not on file   Social History Narrative    No family status information on file.   Family History  Problem Relation Age of Onset  . Heart  disease    . Alcohol abuse    . Diabetes Mother   . Diabetes Sister   . Diabetes Brother      ROS:  Full 14 point review of systems complete and found to be negative unless listed above.  Physical Exam: Blood pressure 206/93, pulse 83, temperature 98.5 F (36.9 C), temperature source Oral, resp. rate 16, height _0  (1.626 m), SpO2 97 %.  General: Chronically ill appearing  male in no acute distress with sun glasses on.  Head: Eyes PERRLA, No xanthomas. Normocephalic and atraumatic, oropharynx without edema or exudate.  Lungs: Resp regular and unlabored, CTA. Heart:  RRR or s3, s4 or murmur.   Neck: No carotid bruits. No lymphadenopathy.  JVD. Abdomen: Bowel sounds present, abdomen soft and non-tender without masses or hernias noted. Msk:  No spine or cva tenderness. No weakness, no joint deformities or effusions. Extremities: No clubbing, cyanosis. Radial pulse 2+ and equal bilaterally. Right BKA. Left foot with malodorous discharge. Unable to palpate left leg distal pulse. Pos. Dialysis fistula of RUE with bruit. Neuro: Alert and oriented X 3. No focal deficits noted. Psych:  Good affect, responds appropriately Skin: No rashes or lesions noted.  Labs:   Lab Results  Component Value Date   WBC 14.3* 05/14/2015   HGB 10.9* 05/14/2015   HCT 32.0* 05/14/2015   MCV 94.4 05/14/2015   PLT 160 05/14/2015   No results for input(s): INR in the last 72 hours.  Recent Labs Lab 05/14/15 1122 05/14/15 1128  NA 138 133*  K 4.5 4.4  CL 92* 96*  CO2 26  --   BUN 64* 58*  CREATININE 10.13* 10.10*  CALCIUM 8.4*  --   GLUCOSE 176* 176*    Recent Labs  05/14/15 1122  TROPONINI 1.17*    Recent Labs  05/14/15 1126  TROPIPOC 0.85*    Echo: LV EF: 55% -  60%  ------------------------------------------------------------------- Indications:   Murmur 785.2.  ------------------------------------------------------------------- History:  PMH:  Dyspnea. Risk factors:  Current tobacco use. Hypertension. Diabetes mellitus. Dyslipidemia.  ------------------------------------------------------------------- Study Conclusions  - Left ventricle: The cavity size was normal. Wall thickness was increased in a pattern of severe LVH. Systolic function was normal. The estimated ejection fraction was in the range of 55% to 60%. Wall motion was normal; there were no regional wall motion abnormalities. Features are consistent with a pseudonormal left ventricular filling pattern, with concomitant abnormal relaxation and increased filling pressure (grade 2 diastolic dysfunction). - Aortic valve: There was mild regurgitation. - Left atrium: The atrium was mildly dilated.  ECG:  Right and left arm electrode reversal, interpretation assumes no reversal Sinus rhythm at rate of 87 Borderline short PR interval Right axis deviation Consider left ventricular hypertrophy Abnormal T, consider ischemia, anterior leads Prolonged QT interval  Cath 04/2011 CORONARY ANGIOGRAPHY: Left main coronary artery: The vessel is normal in size and free of significant disease. Left anterior descending artery: The vessel is normal in size and mildly tortuous. It has no significant coronary artery disease although it does have mild calcifications and minor irregularities. The first diagonal is normal in size and free of significant disease. Second diagonal is medium size and third diagonal is small in size. All are free of significant disease. Left circumflex artery: The vessel is normal in size and nondominant. It is free of any significant disease. Right coronary artery: The vessel is normal in size and dominant. It has mild calcifications and minor irregularities, but otherwise no obstructive disease.  STUDY CONCLUSIONS: 1. Normal LV systolic function with an estimated ejection fraction of  60%. 2. Borderline elevated left ventricular end-diastolic  pressure. 3. Mild luminal irregularities without any evidence of obstructive  coronary artery disease.  Radiology:  Dg Chest 2 View  05/14/2015   CLINICAL DATA:  Shortness of Breath, history of diabetes, generalized weakness  EXAM: CHEST  2 VIEW  COMPARISON:  01/11/2015  FINDINGS: Borderline cardiomegaly. Mild interstitial prominence bilaterally without convincing pulmonary edema. No segmental infiltrate. No pneumothorax. Bony thorax is stable.  IMPRESSION: Mild interstitial prominence bilateral without convincing pulmonary edema. No segmental infiltrate.   Electronically Signed   By: Julien Girt  Pop M.D.   On: 05/14/2015 12:28    ASSESSMENT AND PLAN:     1. NSTEMI - Asymptomatic. EKG NSR at rate of 87; TWI in lead V2 and V3. POc trop 0.85, trop I 1.17. Likely due to HTN urgency and ESRD, last dialysis 3 days ago which he was unable to complete.  - Recommended tele admission, cycle trop and repeat ekg in morning -  LHC in 2012 which revealed mild luminal irregularities without any evidence of obstructive coronary artery disease and normal LVF after abnormal nuclear stress test. Echo 12/2014 showed LV EF of 55-60%; severe LVH; no WM abnormality; grade 2 DD; mild aortic reg. No ischemic workup unless significant elevation of troponin - Continue ASA, Statin, ACEI - Recommended adding BB - Will get lipid panel, TSH  2. PVD/ L foot gangrene - hx of  right BKA - Plan for Left BKA vs AKA 05/28/15    3. 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 Hgb of 10.9 today, stable  4. Hypertensive urgency - presented with BP of 221/97 - noncompliance to medications - recommended adding BB  5. DM with neuropathy - Per primary  6. ESRD - On HD (Tue, Thu, sat) - per nephrology  Signed: Leanor Kail, [place] 05/14/2015, 1:49 PM Pager [phone]  Co-Sign MD Patient seen and examined and history reviewed. Agree with above findings and plan. Patient seen for evaluation of elevated troponin and Ecg changes. Presents with  painful left foot, not feeling well, and mental status changes. He had a normal cardiac cath in 2012. He has PAD and is s/p right BKA. Received only half of dialysis on Sat. On exam BP is severely elevated. NSR. Lungs with coarse BS. No murmur. Left foot with black, gangrenous second and third toes. Very painful and foul odor. Ecg shows new T wave inversion in V2-3. Troponin mildly elevated. I think Ecg changes and troponin elevation are related to strain from severe HTN and LVH. I doubt this represents an ACS. Will cycle cardiac enzymes and repeat Ecg in am. Focus on BP control. Would consider of bisoprolol/ Bystolic given alpha blocking properties. Vascular surgery and Renal consulted by primary team.   Jenniah Bhavsar [place], [place] 05/14/2015 3:46 PM

## 2015-05-14 NOTE — Progress Notes (Signed)
Upon arrival from ED, hemodialysis nurses came to 3W to take pt immediatly to dialysis.

## 2015-05-14 NOTE — ED Provider Notes (Signed)
CSN: [id number]     Arrival date & time 05/14/15  1040 History   First MD Initiated Contact with Patient 05/14/15 1040     Chief Complaint  Patient presents with  . Weakness   (Consider location/radiation/quality/duration/timing/severity/associated sxs/prior Treatment) The history is provided by the patient and medical records.    This is a 64 year old male with past medical history significant for hyperlipidemia, diabetes, diabetic neuropathy, hypertension, peripheral vascular disease, end-stage renal disease on hemodialysis, presenting to the ED for family reported generalized weakness. Patient states this morning he was joking with his family and pretending to go "in and out" so family called EMS.  No falls or syncope.  He states he currently feels "pretty good".  He does report some SOB, due to regularly scheduled dialysis today.  Denies cough, fever, sweats, chills.  No chest pain.  No abdominal pain, N/V/D.  Patient has not made urine in several years.  Patient has left leg pain, due for amputation on 05/28/15.  Patient was hypoxic on EMS arrival, improved with 2L supplemental O2 via nasal cannula.  He does not use home O2.  Past Medical History  Diagnosis Date  . Diabetes mellitus     Type 2. Diagnosed in mid 68s. Currently insulin requiring  . Hyperlipidemia   . ESRD (end stage renal disease) on dialysis     due to diabetic nephropathy. T/Th/Sat  . Tobacco abuse   . Diabetic neuropathy   . Diabetic retinopathy     legally blind  . Hypertension   . Dialysis patient   . Peripheral vascular disease    Past Surgical History  Procedure Laterality Date  . None    . Cardiac catheterization  05/06/2011    no significant CAD, EF 55-60%, LVEDP :15   . Av fistula placement    . Abdominal aortagram N/A 01/07/2015    Procedure: ABDOMINAL Maxcine Ham;  Surgeon: Conrad [place], MD;  Location: [facility] CATH LAB;  Service: Cardiovascular;  Laterality: N/A;  . Amputation Right 01/09/2015    Procedure:  AMPUTATION BELOW KNEE RIGHT LEG;  Surgeon: Conrad Cameron, MD;  Location: [place];  Service: Vascular;  Laterality: Right;   Family History  Problem Relation Age of Onset  . Heart disease    . Alcohol abuse    . Diabetes Mother   . Diabetes Sister   . Diabetes Brother    History  Substance Use Topics  . Smoking status: Former Smoker -- 0.50 packs/day for 30 years    Types: Cigarettes    Quit date: 01/09/2015  . Smokeless tobacco: Never Used  . Alcohol Use: No    Review of Systems  Neurological: Positive for weakness.  All other systems reviewed and are negative.     Allergies  Review of patient's allergies indicates no known allergies.  Home Medications   Prior to Admission medications   Medication Sig Start Date End Date Taking? Authorizing Provider  ACCU-CHEK AVIVA PLUS test strip  01/17/15   Historical Provider, MD  ACCU-CHEK SOFTCLIX LANCETS lancets  01/16/15   Historical Provider, MD  albuterol (PROVENTIL HFA;VENTOLIN HFA) 108 (90 BASE) MCG/ACT inhaler Inhale 2 puffs into the lungs every 6 (six) hours as needed for wheezing or shortness of breath.    Historical Provider, MD  amphetamine-dextroamphetamine (ADDERALL) 5 MG tablet Take 5 mg by mouth daily.  11/21/14   Historical Provider, MD  aspirin EC 81 MG tablet Take 81 mg by mouth daily.    Historical Provider, MD  B-D  UF III MINI PEN NEEDLES 31G X 5 MM MISC  01/21/15   Historical Provider, MD  Blood Glucose Monitoring Suppl (ACCU-CHEK AVIVA PLUS) W/DEVICE KIT  01/16/15   Historical Provider, MD  cephALEXin (KEFLEX) 500 MG capsule Take 1 capsule (500 mg total) by mouth 3 (three) times daily. 04/11/15   Bronson Ing, DPM  HUMULIN 70/30 (70-30) 100 UNIT/ML injection  04/12/15   Historical Provider, MD  HUMULIN 70/30 KWIKPEN (70-30) 100 UNIT/ML PEN Inject 5-20 Units into the skin 2 (two) times daily as needed (CBG over 175).  11/27/14   Historical Provider, MD  HYDROcodone-acetaminophen (NORCO) 10-325 MG per tablet Take 1 tablet by  mouth every 6 (six) hours as needed. 01/14/15   Alvia Grove, PA-C  insulin glargine (LANTUS) 100 unit/mL SOPN Inject 25 Units into the skin at bedtime.    Historical Provider, MD  LANTUS 100 UNIT/ML injection  04/08/15   Historical Provider, MD  lisinopril (PRINIVIL,ZESTRIL) 20 MG tablet Take 20 mg by mouth daily.    Historical Provider, MD  metoCLOPramide (REGLAN) 5 MG tablet Take 5 mg by mouth 4 (four) times daily -  before meals and at bedtime.    Historical Provider, MD  oxyCODONE (OXY IR/ROXICODONE) 5 MG immediate release tablet Take 1 tablet (5 mg total) by mouth every 8 (eight) hours as needed for severe pain. Patient not taking: Reported on 04/17/2015 01/04/15   Conrad Shrewsbury, MD  pregabalin (LYRICA) 75 MG capsule Take 75 mg by mouth 3 (three) times daily.    Historical Provider, MD  ranitidine (ZANTAC) 150 MG tablet Take 150 mg by mouth daily.  01/24/14   Historical Provider, MD  sevelamer (RENVELA) 800 MG tablet Take 3,200 mg by mouth 3 (three) times daily with meals.     Historical Provider, MD  silver sulfADIAZINE (SILVADENE) 1 % cream Apply 1 application topically daily. 03/29/15   Bronson Ing, DPM  simvastatin (ZOCOR) 40 MG tablet Take 40 mg by mouth daily.     Historical Provider, MD  Flossie Buffy INSULIN SYRINGE 29G X 1/2" 0.5 ML MISC  04/08/15   Historical Provider, MD   BP 213/92 mmHg  Pulse 86  Temp(Src) 98.5 F (36.9 C) (Oral)  Resp 19  Ht 5' 4"  (1.626 m)  SpO2 89%   Physical Exam  Constitutional: He is oriented to person, place, and time. He appears well-developed and well-nourished. No distress.  Legally blind, wearing dark sunglasses  HENT:  Head: Normocephalic and atraumatic.  Mouth/Throat: Oropharynx is clear and moist.  Eyes: Conjunctivae and EOM are normal. Pupils are equal, round, and reactive to light.  Neck: Normal range of motion. Neck supple.  Cardiovascular: Normal rate, regular rhythm and normal heart sounds.   Pulmonary/Chest: Effort normal and breath  sounds normal. No respiratory distress. He has no wheezes.  2L O2 via nasal cannula in place, O2 sats 98%, no distress noted  Abdominal: Soft. Bowel sounds are normal. There is no tenderness. There is no guarding.  Musculoskeletal: Normal range of motion. He exhibits no edema.  Dialysis fistula of RUE; strong thrill Gangrenous toes left foot, foul smelling Right BKA  Neurological: He is alert and oriented to person, place, and time.  Skin: Skin is warm and dry. He is not diaphoretic.  Psychiatric: He has a normal mood and affect.  Nursing note and vitals reviewed.   ED Course  Procedures (including critical care time)  CRITICAL CARE Performed by: Larene Pickett   Total critical care time:  35  Critical care time was exclusive of separately billable procedures and treating other patients.  Critical care was necessary to treat or prevent imminent or life-threatening deterioration.  Critical care was time spent personally by me on the following activities: development of treatment plan with patient and/or surrogate as well as nursing, discussions with consultants, evaluation of patient's response to treatment, examination of patient, obtaining history from patient or surrogate, ordering and performing treatments and interventions, ordering and review of laboratory studies, ordering and review of radiographic studies, pulse oximetry and re-evaluation of patient's condition.  Medications  aspirin chewable tablet 324 mg (324 mg Oral Given 05/14/15 1143)    Labs Review Labs Reviewed  CBC WITH DIFFERENTIAL/PLATELET - Abnormal; Notable for the following:    WBC 14.3 (*)    RBC 3.20 (*)    Hemoglobin 10.0 (*)    HCT 30.2 (*)    Neutrophils Relative % 87 (*)    Neutro Abs 12.4 (*)    Lymphocytes Relative 7 (*)    All other components within normal limits  BASIC METABOLIC PANEL - Abnormal; Notable for the following:    Chloride 92 (*)    Glucose, Bld 176 (*)    BUN 64 (*)     Creatinine, Ser 10.13 (*)    Calcium 8.4 (*)    GFR calc non Af Amer 5 (*)    GFR calc Af Amer 6 (*)    Anion gap 20 (*)    All other components within normal limits  I-STAT TROPOININ, ED - Abnormal; Notable for the following:    Troponin i, poc 0.85 (*)    All other components within normal limits  I-STAT CHEM 8, ED - Abnormal; Notable for the following:    Sodium 133 (*)    Chloride 96 (*)    BUN 58 (*)    Creatinine, Ser 10.10 (*)    Glucose, Bld 176 (*)    Calcium, Ion 0.88 (*)    Hemoglobin 10.9 (*)    HCT 32.0 (*)    All other components within normal limits  TROPONIN I    Imaging Review Dg Chest 2 View  05/14/2015   CLINICAL DATA:  Shortness of Breath, history of diabetes, generalized weakness  EXAM: CHEST  2 VIEW  COMPARISON:  01/11/2015  FINDINGS: Borderline cardiomegaly. Mild interstitial prominence bilaterally without convincing pulmonary edema. No segmental infiltrate. No pneumothorax. Bony thorax is stable.  IMPRESSION: Mild interstitial prominence bilateral without convincing pulmonary edema. No segmental infiltrate.   Electronically Signed   By: Lahoma Crocker M.D.   On: 05/14/2015 12:28     EKG Interpretation   Date/Time:  Tuesday May 14 2015 11:06:06 EDT Ventricular Rate:  87 PR Interval:  113 QRS Duration: 98 QT Interval:  457 QTC Calculation: 550 R Axis:   -139 Text Interpretation:  Right and left arm electrode reversal,  interpretation assumes no reversal Sinus rhythm Borderline short PR  interval Right axis deviation Consider left ventricular hypertrophy  Abnormal T, consider ischemia, anterior leads Prolonged QT interval  Confirmed by Winfred Leeds  MD, SAM [phone]) on 05/14/2015 11:31:43 AM      MDM   Final diagnoses:  Generalized weakness  NSTEMI (non-ST elevated myocardial infarction)  ESRD on hemodialysis   64 year old male here with generalized weakness per family. Patient reports he was "joking around".  He is mildly hypoxic to 89% on RA.   Patient does not use home O2, reports some mild SOB.  He is due for dialysis  today.  His EKG appears changed from prior with new t-wave inversions anteriorly.  i-stat troponin elevated at 0.85, lab troponin at 1.17.  Will give full dose ASA here, consult cardiology.  Patient is established with Dr. Fletcher Anon.  Will need medicine admission given his multiple ongoing medical problems.  Nephrology service also notified of patient, will see and arrange dialysis.  3:04 PM Cardiology has evaluated patient, will plan to cycle enzymes but no emergent intervention.  Nephrology has also evaluated, arranging dialysis.  Patient admitted to hospitalist service for further management.  Larene Pickett, PA-C 05/14/15 [place], MD 05/14/15 [phone]

## 2015-05-14 NOTE — ED Notes (Signed)
Pt complaining of being hungry. A tray has ordered for the patient. RN offered pt crackers and a sandwich until tray comes. Pt refused.

## 2015-05-14 NOTE — Consult Note (Addendum)
[facility] NOTE   MRN : [id number]  Reason for Consult: left foot gangrene ischemic toes Referring Physician: Dr. Tana Coast  History of Present Illness: 64 y/o male with 9 week history of Left foot foot and malodorous black toes.  He is known to our office and is s/p right BKA by Dr. Bridgett Larsson for rest pain symptoms and ischemic toes on the right 01/04/2015.  Atherosclerotic risk factors include: DM, HLD managed with Zocor, ESRD on dialysis for 9 years, HTN managed with Lisinopril, and Smoking.        Current Facility-Administered Medications  Medication Dose Route Frequency Provider Last Rate Last Dose  . 0.9 %  sodium chloride infusion  250 mL Intravenous PRN Melton Alar, PA-C      . acetaminophen (TYLENOL) tablet 650 mg  650 mg Oral Q6H PRN Melton Alar, PA-C       Or  . acetaminophen (TYLENOL) suppository 650 mg  650 mg Rectal Q6H PRN Melton Alar, PA-C      . amphetamine-dextroamphetamine (ADDERALL) tablet 5 mg  5 mg Oral Daily Melton Alar, PA-C      . aspirin EC tablet 81 mg  81 mg Oral Daily Marianne L York, PA-C      . bisacodyl (DULCOLAX) EC tablet 10 mg  10 mg Oral Once Melton Alar, PA-C      . [START ON 05/16/2015] calcitRIOL (ROCALTROL) capsule 0.5 mcg  0.5 mcg Oral Q T,Th,Sa-HD Ernest Haber, PA-C      . Derrill Memo ON 05/16/2015] Darbepoetin Alfa (ARANESP) injection 60 mcg  60 mcg Intravenous Q Thu-HD Ernest Haber, PA-C      . docusate sodium (COLACE) capsule 100 mg  100 mg Oral BID Melton Alar, PA-C      . famotidine (PEPCID) tablet 20 mg  20 mg Oral Daily Melton Alar, PA-C      . [START ON 05/16/2015] ferric gluconate (NULECIT) 62.5 mg in sodium chloride 0.9 % 100 mL IVPB  62.5 mg Intravenous Q T,Th,Sa-HD Ernest Haber, PA-C      . heparin injection 5,000 Units  5,000 Units Subcutaneous 3 times per day Melton Alar, PA-C      . hydrALAZINE (APRESOLINE) injection 10 mg  10 mg Intravenous Q6H PRN Melton Alar, PA-C      .  HYDROcodone-acetaminophen (NORCO) 10-325 MG per tablet 1 tablet  1 tablet Oral Q6H PRN Melton Alar, PA-C      . insulin aspart (novoLOG) injection 0-9 Units  0-9 Units Subcutaneous TID WC Marianne L York, PA-C      . insulin glargine (LANTUS) Solostar Pen 25 Units  25 Units Subcutaneous QHS Melton Alar, PA-C      . lisinopril (PRINIVIL,ZESTRIL) tablet 20 mg  20 mg Oral Daily Melton Alar, PA-C      . metoCLOPramide (REGLAN) tablet 5 mg  5 mg Oral TID AC & HS Marianne L York, PA-C      . sevelamer carbonate (RENVELA) tablet 3,200 mg  3,200 mg Oral TID WC Marianne L York, PA-C      . simvastatin (ZOCOR) tablet 40 mg  40 mg Oral Daily Marianne L York, PA-C      . sodium chloride 0.9 % injection 3 mL  3 mL Intravenous Q12H Marianne L York, PA-C      . sodium chloride 0.9 % injection 3 mL  3 mL Intravenous Q12H Melton Alar, PA-C      .  sodium chloride 0.9 % injection 3 mL  3 mL Intravenous PRN Melton Alar, PA-C        Pt meds include: Statin :Yes Betablocker: No ASA: yes Other anticoagulants/antiplatelets: none  Past Medical History  Diagnosis Date  . Diabetes mellitus     Type 2. Diagnosed in mid 97s. Currently insulin requiring  . Hyperlipidemia   . ESRD (end stage renal disease) on dialysis     due to diabetic nephropathy. T/Th/Sat  . Tobacco abuse   . Diabetic neuropathy   . Diabetic retinopathy     legally blind  . Hypertension   . Dialysis patient   . Peripheral vascular disease   . Hx of right BKA     Past Surgical History  Procedure Laterality Date  . None    . Cardiac catheterization  05/06/2011    no significant CAD, EF 55-60%, LVEDP :15   . Av fistula placement    . Abdominal aortagram N/A 01/07/2015    Procedure: ABDOMINAL Maxcine Ham;  Surgeon: Conrad South Paris, MD;  Location: [facility] CATH LAB;  Service: Cardiovascular;  Laterality: N/A;  . Amputation Right 01/09/2015    Procedure: AMPUTATION BELOW KNEE RIGHT LEG;  Surgeon: Conrad Starr School, MD;  Location: [place];   Service: Vascular;  Laterality: Right;    Social History History  Substance Use Topics  . Smoking status: Current Every Day Smoker -- 1.00 packs/day for 30 years    Types: Cigarettes    Last Attempt to Quit: 01/09/2015  . Smokeless tobacco: Never Used  . Alcohol Use: No    Family History Family History  Problem Relation Age of Onset  . Heart disease    . Alcohol abuse    . Diabetes Mother   . Diabetes Sister   . Diabetes Brother     No Known Allergies   REVIEW OF SYSTEMS  General: [ ]  Weight loss, [ ]  Fever, [ ]  chills Neurologic: [ ]  Dizziness, [ ]  Blackouts, [ ]  Seizure [ ]  Stroke, [ ]  "Mini stroke", [ ]  Slurred speech, [ ]  Temporary blindness; [ ]  weakness in arms or legs, [ ]  Hoarseness [ ]  Dysphagia Cardiac: [ ]  Chest pain/pressure, [ ]  Shortness of breath at rest [ ]  Shortness of breath with exertion, [ ]  Atrial fibrillation or irregular heartbeat  Vascular: [ ]  Pain in legs with walking, [x ] Pain in legs at rest, [ ]  Pain in legs at night,  [x ] Non-healing ulcer, [ ]  Blood clot in vein/DVT,   Pulmonary: [ ]  Home oxygen, [ ]  Productive cough, [ ]  Coughing up blood, [ ]  Asthma,  [ ]  Wheezing [ ]  COPD Musculoskeletal:  [ ]  Arthritis, [ ]  Low back pain, [ ]  Joint pain Hematologic: [ ]  Easy Bruising, [ ]  Anemia; [ ]  Hepatitis Gastrointestinal: [ ]  Blood in stool, [ ]  Gastroesophageal Reflux/heartburn, Urinary: [ ]  chronic Kidney disease, [x ] on HD - [ ]  MWF or [x ] TTHS, [ ]  Burning with urination, [ ]  Difficulty urinating Skin: [ ]  Rashes, [x ] Wounds Psychological: [ ]  Anxiety, [ ]  Depression  Physical Examination Filed Vitals:   05/14/15 1430 05/14/15 1445 05/14/15 1500 05/14/15 1515  BP: 198/90 195/93 202/91 205/92  Pulse: 78 76 73 79  Temp:      TempSrc:      Resp: 15 16 13 17   Height:      SpO2: 96% 94% 94% 92%   There is no weight on file to calculate  BMI.  General:  Thin, malnourished, and  in NAD HENT: WNL Eyes: Pupils equal Pulmonary: normal  non-labored breathing , without Rales, rhonchi,  wheezing Cardiac: RRR, without  Murmurs, rubs or gallops; No carotid bruits Abdomen: soft, NT, no masses Skin: no rashes, ulcers noted;  positive Gangrene Base of first ray, 2-4 and medial base of fifth ray, no cellulitis; no open wounds;   Vascular Exam/Pulses:Palpable weak femoral pulses bil, left popliteal weak palpation, Doppler monophasic PT and AT, no signal at [facility]   Musculoskeletal: no muscle wasting or atrophy; no edema  Neurologic: A&O X 3; Appropriate Affect ;  SENSATION: normal; MOTOR FUNCTION: 4/5 Symmetric LE hips and knees, right BKA Speech is fluent/normal   Significant Diagnostic Studies: CBC Lab Results  Component Value Date   WBC 14.3* 05/14/2015   HGB 10.9* 05/14/2015   HCT 32.0* 05/14/2015   MCV 94.4 05/14/2015   PLT 160 05/14/2015    BMET    Component Value Date/Time   NA 133* 05/14/2015 1128   K 4.4 05/14/2015 1128   CL 96* 05/14/2015 1128   CO2 26 05/14/2015 1122   GLUCOSE 176* 05/14/2015 1128   BUN 58* 05/14/2015 1128   CREATININE 10.10* 05/14/2015 1128   CALCIUM 8.4* 05/14/2015 1122   GFRNONAA 5* 05/14/2015 1122   GFRAA 6* 05/14/2015 1122   CrCl cannot be calculated (Unknown ideal weight.).  COAG Lab Results  Component Value Date   INR 1.08 01/07/2015     Non-Invasive Vascular Imaging:  01/07/2015 angiogram  Left Heavily calcified stenosis, occluded popliteal, Reconstitutes from collaterals, Originates from reconstituted tibioperoneal trunk, miniscule distally, Originates from reconstituted tibioperoneal trunk, dominant runoff.  ASSESSMENT/PLAN:  Left toes gangrene with history of rest pain and right BKA He may be a candidate for revascularization based on his angiogram from 01/07/2015.  Or he may need to consider transmetatarsal amputation.  This will be discussed with Dr. Fae Pippin, EMMA [facility] 05/14/2015 4:02 PM  I agree with the above.   This is a  64 y/o male with 9  week history of  left foot pain and malodorous black toes.  He is known to our office and is s/p right BKA by Dr. Bridgett Larsson for rest pain symptoms and ischemic toes on the right 01/04/2015.  The patient suffers from diabetes which is failry well controlled with a HbA1c in 12/2014 of 6.0.  His hypercholesterolemia is managed with a statin.  He has ESRD secondary to diabetic nephropathy, and has been on dialysis for 9 years on T/TH/Sat.  His hypertension is managed with a ACE-I.  The patient continues to smoke.  On examination he has ischemic changes with drainage to the toes on the left foot.  I have reviewed his angiogram from 01/07/2015 which shows an occluded left superficial femoral and popliteal artery.  There is reconstitution of calcified diseased tibial vessels.  I discussed with the patient he is at high risk for amputation of his left leg.  He may potentially be a candidate for revascularization, possibly percutaneous or surgical in an attempt to keep his amputation limited to a transmetatarsal amputation.  I will discuss with Dr. Bridgett Larsson whether or not to proceed with angiography either tomorrow or Thursday.  I will keep him NPO tonight in case angiography can be done tomorrow. He has been seen by cardiology (Dr. Martinique).  His elevated troponin is felt to be strain from severe HTN and LVH.   He felt that it is doubtful that  this represents an ACS.  Risk stratification would be requested if surgical revascularization is required.  Continue IV Abx.  Annamarie Major

## 2015-05-14 NOTE — Consult Note (Signed)
[facility] Renal Consultation Note  Indication for Consultation:  Management of ESRD/hemodialysis; anemia, hypertension/volume and secondary hyperparathyroidism  HPI: Garrett Salas is a 64 y.o. male brought to the ER reported sec to reported AMS at home . No family present to give history.  Per pt " My Nursing Aide said I was not acting right."ER RN  Reported per family=Not eating,drinking much and acting right." Pt has HO severe Peripheral vascular Disease  Dr. Baldo Ash notes June 1 stated  needs Left BKA or AKA with dry gangrene . Pt. Now states ="I am ready for Amputation ".  EMS reported "hypoxia at first " improved in ER  With [place] o2 /   Noted in ER sl pos .Tropinin  (but pt is ESRD) with some EKG changes,ho card cath  2012 no sig  CAD with EF  55-60% 04/2011. He denies  chest pain, no sob, fevers, chills, gi symptoms. He last had HD Saturday on Schedule  But signed off early. TTS ([facility]), Last week  [facility] op kid center,Dr. Justin Mend told pt to come to ER for admit amputation. Alert in ER and only cos L foot pain and asking for food,      Past Medical History  Diagnosis Date  . Diabetes mellitus     Type 2. Diagnosed in mid 19s. Currently insulin requiring  . Hyperlipidemia   . ESRD (end stage renal disease) on dialysis     due to diabetic nephropathy. T/Th/Sat  . Tobacco abuse   . Diabetic neuropathy   . Diabetic retinopathy     legally blind  . Hypertension   . Dialysis patient   . Peripheral vascular disease   . Hx of right BKA     Past Surgical History  Procedure Laterality Date  . None    . Cardiac catheterization  05/06/2011    no significant CAD, EF 55-60%, LVEDP :15   . Av fistula placement    . Abdominal aortagram N/A 01/07/2015    Procedure: ABDOMINAL Maxcine Ham;  Surgeon: Conrad Mancos, MD;  Location: [facility] CATH LAB;  Service: Cardiovascular;  Laterality: N/A;  . Amputation Right 01/09/2015    Procedure: AMPUTATION BELOW KNEE RIGHT LEG;  Surgeon: Conrad Lyons, MD;   Location: [place];  Service: Vascular;  Laterality: Right;      Family History  Problem Relation Age of Onset  . Heart disease    . Alcohol abuse    . Diabetes Mother   . Diabetes Sister   . Diabetes Brother    Social = lives with wife and son  And Nurses aide comes almost daily   reports that he has been smoking Cigarettes.  He has a 30 pack-year smoking history. He has never used smokeless tobacco. He reports that he uses illicit drugs (Marijuana). He reports that he does not drink alcohol.  No Known Allergies  Prior to Admission medications   Medication Sig Start Date End Date Taking? Authorizing Provider  ACCU-CHEK AVIVA PLUS test strip  01/17/15  Yes Historical Provider, MD  ACCU-CHEK SOFTCLIX LANCETS lancets  01/16/15  Yes Historical Provider, MD  albuterol (PROVENTIL HFA;VENTOLIN HFA) 108 (90 BASE) MCG/ACT inhaler Inhale 2 puffs into the lungs every 6 (six) hours as needed for wheezing or shortness of breath.   Yes Historical Provider, MD  amphetamine-dextroamphetamine (ADDERALL) 5 MG tablet Take 5 mg by mouth daily.  11/21/14  Yes Historical Provider, MD  aspirin EC 81 MG tablet Take 81 mg by mouth daily.  Yes Historical Provider, MD  B-D UF III MINI PEN NEEDLES 31G X 5 MM MISC  01/21/15  Yes Historical Provider, MD  Blood Glucose Monitoring Suppl (ACCU-CHEK AVIVA PLUS) W/DEVICE KIT  01/16/15  Yes Historical Provider, MD  HUMULIN 70/30 KWIKPEN (70-30) 100 UNIT/ML PEN Inject 5-20 Units into the skin 2 (two) times daily as needed (CBG over 175).  11/27/14  Yes Historical Provider, MD  HYDROcodone-acetaminophen (NORCO) 10-325 MG per tablet Take 1 tablet by mouth every 6 (six) hours as needed. 01/14/15  Yes Kimberly A Trinh, PA-C  insulin glargine (LANTUS) 100 unit/mL SOPN Inject 25 Units into the skin at bedtime.   Yes Historical Provider, MD  lisinopril (PRINIVIL,ZESTRIL) 20 MG tablet Take 20 mg by mouth daily.   Yes Historical Provider, MD  metoCLOPramide (REGLAN) 5 MG tablet Take 5 mg by  mouth 4 (four) times daily -  before meals and at bedtime.   Yes Historical Provider, MD  pregabalin (LYRICA) 75 MG capsule Take 75 mg by mouth 3 (three) times daily.   Yes Historical Provider, MD  ranitidine (ZANTAC) 150 MG tablet Take 150 mg by mouth daily.  01/24/14  Yes Historical Provider, MD  sevelamer (RENVELA) 800 MG tablet Take 3,200 mg by mouth 3 (three) times daily with meals.    Yes Historical Provider, MD  silver sulfADIAZINE (SILVADENE) 1 % cream Apply 1 application topically daily. 03/29/15  Yes Bronson Ing, DPM  ULTICARE INSULIN SYRINGE 29G X 1/2" 0.5 ML MISC  04/08/15  Yes Historical Provider, MD  cephALEXin (KEFLEX) 500 MG capsule Take 1 capsule (500 mg total) by mouth 3 (three) times daily. Patient not taking: Reported on 05/14/2015 04/11/15   Bronson Ing, DPM  oxyCODONE (OXY IR/ROXICODONE) 5 MG immediate release tablet Take 1 tablet (5 mg total) by mouth every 8 (eight) hours as needed for severe pain. Patient not taking: Reported on 04/17/2015 01/04/15   Conrad Hoagland, MD  simvastatin (ZOCOR) 40 MG tablet Take 40 mg by mouth daily.     Historical Provider, MD     Anti-infectives    None      Results for orders placed or performed during the hospital encounter of 05/14/15 (from the past 48 hour(s))  CBC with Differential     Status: Abnormal   Collection Time: 05/14/15 11:22 AM  Result Value Ref Range   WBC 14.3 (H) 4.0 - 10.5 K/uL   RBC 3.20 (L) 4.22 - 5.81 MIL/uL   Hemoglobin 10.0 (L) 13.0 - 17.0 g/dL   HCT 30.2 (L) 39.0 - 52.0 %   MCV 94.4 78.0 - 100.0 fL   MCH 31.3 26.0 - 34.0 pg   MCHC 33.1 30.0 - 36.0 g/dL   RDW 15.2 11.5 - 15.5 %   Platelets 160 150 - 400 K/uL   Neutrophils Relative % 87 (H) 43 - 77 %   Neutro Abs 12.4 (H) 1.7 - 7.7 K/uL   Lymphocytes Relative 7 (L) 12 - 46 %   Lymphs Abs 0.9 0.7 - 4.0 K/uL   Monocytes Relative 6 3 - 12 %   Monocytes Absolute 0.9 0.1 - 1.0 K/uL   Eosinophils Relative 0 0 - 5 %   Eosinophils Absolute 0.0 0.0 - 0.7  K/uL   Basophils Relative 0 0 - 1 %   Basophils Absolute 0.0 0.0 - 0.1 K/uL  Basic metabolic panel     Status: Abnormal   Collection Time: 05/14/15 11:22 AM  Result Value Ref Range  Sodium 138 135 - 145 mmol/L   Potassium 4.5 3.5 - 5.1 mmol/L   Chloride 92 (L) 101 - 111 mmol/L   CO2 26 22 - 32 mmol/L   Glucose, Bld 176 (H) 65 - 99 mg/dL   BUN 64 (H) 6 - 20 mg/dL   Creatinine, Ser 10.13 (H) 0.61 - 1.24 mg/dL   Calcium 8.4 (L) 8.9 - 10.3 mg/dL   GFR calc non Af Amer 5 (L) >60 mL/min   GFR calc Af Amer 6 (L) >60 mL/min    Comment: (NOTE) The eGFR has been calculated using the CKD EPI equation. This calculation has not been validated in all clinical situations. eGFR's persistently <60 mL/min signify possible Chronic Kidney Disease.    Anion gap 20 (H) 5 - 15  Troponin I     Status: Abnormal   Collection Time: 05/14/15 11:22 AM  Result Value Ref Range   Troponin I 1.17 (HH) <0.031 ng/mL    Comment:        POSSIBLE MYOCARDIAL ISCHEMIA. SERIAL TESTING RECOMMENDED. CRITICAL RESULT CALLED TO, READ BACK BY AND VERIFIED WITH: RICE G RN 05/14/15 1250 COSTELLO B REPEATED TO VERIFY   I-stat troponin, ED     Status: Abnormal   Collection Time: 05/14/15 11:26 AM  Result Value Ref Range   Troponin i, poc 0.85 (HH) 0.00 - 0.08 ng/mL   Comment NOTIFIED PHYSICIAN    Comment 3            Comment: Due to the release kinetics of cTnI, a negative result within the first hours of the onset of symptoms does not rule out myocardial infarction with certainty. If myocardial infarction is still suspected, repeat the test at appropriate intervals.   I-Stat Chem 8, ED     Status: Abnormal   Collection Time: 05/14/15 11:28 AM  Result Value Ref Range   Sodium 133 (L) 135 - 145 mmol/L   Potassium 4.4 3.5 - 5.1 mmol/L   Chloride 96 (L) 101 - 111 mmol/L   BUN 58 (H) 6 - 20 mg/dL   Creatinine, Ser 10.10 (H) 0.61 - 1.24 mg/dL   Glucose, Bld 176 (H) 65 - 99 mg/dL   Calcium, Ion 0.88 (L) 1.13 - 1.30  mmol/L   TCO2 27 0 - 100 mmol/L   Hemoglobin 10.9 (L) 13.0 - 17.0 g/dL   HCT 32.0 (L) 39.0 - 52.0 %   .  ROS: see hpi for positives  Physical Exam: Filed Vitals:   05/14/15 1400  BP: 203/96  Pulse: 77  Temp:   Resp: 19     General: Alert BM chronically ill  Appearing on ER strecher with Nancy Fetter glasses/OX3/ NAD ?  States "i want some food / ready for my foot amputation " HEENT: [place] , MMM Neck: supple , no jvd Heart: RRR , no mur , rub, or gallop Lungs: CTA bilat , non labored breathing  Abdomen: soft  NT, ND  Extremities: no pedal edema Left  Or  Edema of stump R BKA/ L foot with Malodorous moist gangrene of toes Skin: Left Foot with moist toe gangrene  Neuro: alert/ Arnell Asal Crissie Reese all extrem. Dialysis Access: pos. Bruit R UA AVF  Dialysis Orders: Center: [facility]  on tts . EDW 52 kg HD Bath 2k/2.25ca  Time 3.5 hrs Heparin 0  Access R UA AVF BFR 400  DFR A    PO Calcitriol 0.5  mcg /HD   Mircera  50 mcg Q 4 wks next on 05/14/15  Units IV/HD  Venofer  100 x5 last dose 05/22/14  Other  Op labs hgb 11.2 05/09/15 tfs 10%   Assessment/Plan 1. L  Lower foot Gangreene/ PVD ( ho R BKA) = per  admit team  iv antibiotics  Per admit /VVS consult 2. Positive troponin and Abnormal Ekg changes - Per admit / Card eval  In progress 3. Reported AMS - Now resolved in ER / multilple etiologies Hypoxia with Infection ( gang. Toes) 4. ESRD -  HD TTS hd today  5. Hypertension/volume  - bp up but CXR shows no volume overlaod/ bp med compliance ??/ leaving below edw at op unit  Wt loss with gangr. 6. Anemia  -  hgb  10.0 on low dose esa and fe load 7. Metabolic bone disease -   Vit d  On hd and binders (renvela)_ 8. IIDDM- per admit 9. Nutrition - renal ,carb mod  Renal vitamin  Ernest Haber, PA-C [place] [phone] 05/14/2015, 2:10 PM   Pt seen, examined and agree w A/P as above.  ESRD pt with worsening gangrene of left foot presenting with AMS. Has vasc congestion/ early edema by  CXR, plan HD today with max UF 3-4kg as tolerated. Will follow.  Kelly Splinter MD pager [phone]    cell [phone] 05/14/2015, 4:45 PM

## 2015-05-14 NOTE — Procedures (Signed)
  I was present at this dialysis session, have reviewed the session itself and made  appropriate changes Rob Oswell Say MD (pgr) [phone]    (c) [phone] 05/04/2015, 10:31 AM    

## 2015-05-14 NOTE — H&P (Signed)
[facility] History and Physical                                                                                    Garrett Salas, is a 64 y.o. male  MRN: [id number]   DOB - August 26, 1951  Admit Date - 05/14/2015  Outpatient Primary MD for the patient is [name], MD  Referring Physician:  Quincy Carnes, PA-C  Chief Complaint:   Chief Complaint  Patient presents with  . Weakness     HPI  Garrett Salas  is a 64 y.o. male, with end-stage renal disease (hemodialysis T/T/S), peripheral vascular disease status post right BKA, diabetes mellitus with neuropathy and retinopathy who presents to the emergency department with weakness, decreased appetite and abnormal behavior. Per the EDP records the patient's family and home health nurse felt that he was not acting normally, he has had decreased oral intake for several days, he was demonstrating progressive weakness and therefore was brought to the emergency department. In the ER he is found to have an elevated troponin with EKG changes, and elevated white count (14), and mild hypoxia (89%). He has dry gangrene of his left foot that is being worked up outpatient by Dr. Bridgett Larsson. An amputation (BKA versus AKA) is planned for mid July.  The patient is not cooperative with giving history or exam. He does not want to answer questions. He does not want to be in the hospital. He denies any complaints other then constipation.  Review of Systems   The patient reports that he does not wanted to answer questions. He tells me that the best thing I can do for him this to not ask him any further questions.  Past Medical History  Past Medical History  Diagnosis Date  . Diabetes mellitus     Type 2. Diagnosed in mid 19s. Currently insulin requiring  . Hyperlipidemia   . ESRD (end stage renal disease) on dialysis     due to diabetic nephropathy. T/Th/Sat  . Tobacco abuse   . Diabetic neuropathy   . Diabetic retinopathy     legally blind  .  Hypertension   . Dialysis patient   . Peripheral vascular disease   . Hx of right BKA     Past Surgical History  Procedure Laterality Date  . None    . Cardiac catheterization  05/06/2011    no significant CAD, EF 55-60%, LVEDP :15   . Av fistula placement    . Abdominal aortagram N/A 01/07/2015    Procedure: ABDOMINAL Maxcine Ham;  Surgeon: Conrad Cary, MD;  Location: [facility] CATH LAB;  Service: Cardiovascular;  Laterality: N/A;  . Amputation Right 01/09/2015    Procedure: AMPUTATION BELOW KNEE RIGHT LEG;  Surgeon: Conrad Goehner, MD;  Location: [place];  Service: Vascular;  Laterality: Right;      Social History History  Substance Use Topics  . Smoking status: Current Every Day Smoker -- 1.00 packs/day for 30 years    Types: Cigarettes    Last Attempt to Quit: 01/09/2015  . Smokeless tobacco: Never Used  . Alcohol Use: No    Family History Family History  Problem Relation Age  of Onset  . Heart disease    . Alcohol abuse    . Diabetes Mother   . Diabetes Sister   . Diabetes Brother     Prior to Admission medications   Medication Sig Start Date End Date Taking? Authorizing Provider  ACCU-CHEK AVIVA PLUS test strip  01/17/15  Yes Historical Provider, MD  ACCU-CHEK SOFTCLIX LANCETS lancets  01/16/15  Yes Historical Provider, MD  albuterol (PROVENTIL HFA;VENTOLIN HFA) 108 (90 BASE) MCG/ACT inhaler Inhale 2 puffs into the lungs every 6 (six) hours as needed for wheezing or shortness of breath.   Yes Historical Provider, MD  amphetamine-dextroamphetamine (ADDERALL) 5 MG tablet Take 5 mg by mouth daily.  11/21/14  Yes Historical Provider, MD  aspirin EC 81 MG tablet Take 81 mg by mouth daily.   Yes Historical Provider, MD  B-D UF III MINI PEN NEEDLES 31G X 5 MM MISC  01/21/15  Yes Historical Provider, MD  Blood Glucose Monitoring Suppl (ACCU-CHEK AVIVA PLUS) W/DEVICE KIT  01/16/15  Yes Historical Provider, MD  HUMULIN 70/30 KWIKPEN (70-30) 100 UNIT/ML PEN Inject 5-20 Units into the skin 2 (two)  times daily as needed (CBG over 175).  11/27/14  Yes Historical Provider, MD  HYDROcodone-acetaminophen (NORCO) 10-325 MG per tablet Take 1 tablet by mouth every 6 (six) hours as needed. 01/14/15  Yes Kimberly A Trinh, PA-C  insulin glargine (LANTUS) 100 unit/mL SOPN Inject 25 Units into the skin at bedtime.   Yes Historical Provider, MD  lisinopril (PRINIVIL,ZESTRIL) 20 MG tablet Take 20 mg by mouth daily.   Yes Historical Provider, MD  metoCLOPramide (REGLAN) 5 MG tablet Take 5 mg by mouth 4 (four) times daily -  before meals and at bedtime.   Yes Historical Provider, MD  pregabalin (LYRICA) 75 MG capsule Take 75 mg by mouth 3 (three) times daily.   Yes Historical Provider, MD  ranitidine (ZANTAC) 150 MG tablet Take 150 mg by mouth daily.  01/24/14  Yes Historical Provider, MD  sevelamer (RENVELA) 800 MG tablet Take 3,200 mg by mouth 3 (three) times daily with meals.    Yes Historical Provider, MD  silver sulfADIAZINE (SILVADENE) 1 % cream Apply 1 application topically daily. 03/29/15  Yes Bronson Ing, DPM  ULTICARE INSULIN SYRINGE 29G X 1/2" 0.5 ML MISC  04/08/15  Yes Historical Provider, MD  simvastatin (ZOCOR) 40 MG tablet Take 40 mg by mouth daily.     Historical Provider, MD    No Known Allergies  Physical Exam  Vitals  Blood pressure 205/92, pulse 79, temperature 98.5 F (36.9 C), temperature source Oral, resp. rate 17, height _0  (1.626 m), SpO2 92 %.   General:  Thin, chronically ill-appearing, blind, male lying in bed  Psych:  Alert and oriented. Noncooperative. Refuses to give much history.  ENT:  Ears and Eyes appear Normal, Conjunctivae clear, PER. Moist oral mucosa without erythema or exudates.  Neck:  Supple, No lymphadenopathy appreciated  Respiratory:  Symmetrical chest wall movement, Good air movement bilaterally  Cardiac:  RRR, No Murmurs, no LE edema noted, no JVD.    Abdomen:  Positive bowel sounds, Soft, Non tender, Non distended,  No masses  appreciated  Skin:  No Cyanosis, Normal Skin Turgor, No Skin Rash or Bruise.  Extremities:  Status post right BKA, left third and 4 toes are black and shriveled.  Data Review  CBC  Recent Labs Lab 05/14/15 1122 05/14/15 1128  WBC 14.3*  --   HGB 10.0* 10.9*  HCT 30.2* 32.0*  PLT 160  --   MCV 94.4  --   MCH 31.3  --   MCHC 33.1  --   RDW 15.2  --   LYMPHSABS 0.9  --   MONOABS 0.9  --   EOSABS 0.0  --   BASOSABS 0.0  --     Chemistries   Recent Labs Lab 05/14/15 1122 05/14/15 1128  NA 138 133*  K 4.5 4.4  CL 92* 96*  CO2 26  --   GLUCOSE 176* 176*  BUN 64* 58*  CREATININE 10.13* 10.10*  CALCIUM 8.4*  --     Cardiac Enzymes  Recent Labs Lab 05/14/15 1122  TROPONINI 1.17*    Imaging results:   Dg Chest 2 View  05/14/2015   CLINICAL DATA:  Shortness of Breath, history of diabetes, generalized weakness  EXAM: CHEST  2 VIEW  COMPARISON:  01/11/2015  FINDINGS: Borderline cardiomegaly. Mild interstitial prominence bilaterally without convincing pulmonary edema. No segmental infiltrate. No pneumothorax. Bony thorax is stable.  IMPRESSION: Mild interstitial prominence bilateral without convincing pulmonary edema. No segmental infiltrate.   Electronically Signed   By: Lahoma Crocker M.D.   On: 05/14/2015 12:28    My personal review of EKG: LVH, Flipped T's in anterior leads.  Prolonged QT.   Assessment & Plan  Principal Problem:   Gangrene of foot Active Problems:   Hypertension   ESRD on dialysis   HLD (hyperlipidemia)   DM (diabetes mellitus) type II controlled peripheral vascular disorder   PAD (peripheral artery disease)   Generalized weakness  Elevated troponin with EKG changes Patient has been evaluated by cardiology. He denies chest pain. Will continue home BP medications including lisinopril,  Will admit to a telemetry bed and cycle troponins. Management per cardiology.  Gangrene of the left foot Being cared for outpatient by Dr. Adele Barthel of  vascular surgery.  Amputation potentially scheduled for mid July. Given elevated white count and elevated neutrophils I am concerned for acute infection of his left foot.  Vascular surgery has been consulted for their opinion/recommendations Ordering MR of left foot with and without contrast.  Blood cultures pending Vanco and Zosyn per pharmacy.  End stage renal disease Nephrology has been consulted for hemodialysis.  Hypertensive urgency 221/91 on admission. Will continue patient's home blood pressure medications, request urgent hemodialysis, add when necessary hydralazine.  Diabetes mellitus with neuropathy Continue home dose Lantus (25 units) and sliding scale sensitive. Check hemoglobin A1c.  Generalized weakness Likely due to acute infection. Physical therapy evaluation requested.   Consultants Called:   cardiology, nephrology, vascular   Family Communication:    none at bedside. Patient alert and oriented.  Code Status:   full code.   Condition:   guarded.   Potential Disposition:  to be determined based on further workup. Possible discharge to SNF if patient is willing.  Time spent in minutes : [address],  PA-C on 05/14/2015 at 3:36 PM Between 7am to 7pm - Pager - [phone] After 7pm go to [email or web address] - password [email or web address] And look for the night coverage person covering me after hours  [facility]

## 2015-05-14 NOTE — ED Provider Notes (Signed)
CSN: [id number]     Arrival date & time 05/14/15  1040 History   First MD Initiated Contact with Patient 05/14/15 1040     Chief Complaint  Patient presents with  . Weakness     (Consider location/radiation/quality/duration/timing/severity/associated sxs/prior Treatment) HPI Patient reports that he was argumentative's morning with family members because his left leg hurt, he refused to eat therefore they called 911. He is presently asymptomatic as I examine him. His last dialysis session was 3 days ago. He did not stay for the entire session. Denies shortness of breath after treatment with supplemental oxygen. On exam no distress lungs clear auscultation heart regular rate and rhythm abdomen nondistended nontender left lower extremity with toes to 3 and 4 gangrenous with moist whitish discharge. DP pulses absent right lower extremity with BKA stump Past Medical History  Diagnosis Date  . Diabetes mellitus     Type 2. Diagnosed in mid 46s. Currently insulin requiring  . Hyperlipidemia   . ESRD (end stage renal disease) on dialysis     due to diabetic nephropathy. T/Th/Sat  . Tobacco abuse   . Diabetic neuropathy   . Diabetic retinopathy     legally blind  . Hypertension   . Dialysis patient   . Peripheral vascular disease   . Hx of right BKA    Past Surgical History  Procedure Laterality Date  . None    . Cardiac catheterization  05/06/2011    no significant CAD, EF 55-60%, LVEDP :15   . Av fistula placement    . Abdominal aortagram N/A 01/07/2015    Procedure: ABDOMINAL Maxcine Ham;  Surgeon: Conrad Iroquois, MD;  Location: [facility] CATH LAB;  Service: Cardiovascular;  Laterality: N/A;  . Amputation Right 01/09/2015    Procedure: AMPUTATION BELOW KNEE RIGHT LEG;  Surgeon: Conrad [place], MD;  Location: [place];  Service: Vascular;  Laterality: Right;   Family History  Problem Relation Age of Onset  . Heart disease    . Alcohol abuse    . Diabetes Mother   . Diabetes Sister   . Diabetes  Brother    History  Substance Use Topics  . Smoking status: Current Every Day Smoker -- 1.00 packs/day for 30 years    Types: Cigarettes    Last Attempt to Quit: 01/09/2015  . Smokeless tobacco: Never Used  . Alcohol Use: No    Review of Systems    Allergies  Review of patient's allergies indicates no known allergies.  Home Medications   Prior to Admission medications   Medication Sig Start Date End Date Taking? Authorizing Provider  ACCU-CHEK AVIVA PLUS test strip  01/17/15   Historical Provider, MD  ACCU-CHEK SOFTCLIX LANCETS lancets  01/16/15   Historical Provider, MD  albuterol (PROVENTIL HFA;VENTOLIN HFA) 108 (90 BASE) MCG/ACT inhaler Inhale 2 puffs into the lungs every 6 (six) hours as needed for wheezing or shortness of breath.    Historical Provider, MD  amphetamine-dextroamphetamine (ADDERALL) 5 MG tablet Take 5 mg by mouth daily.  11/21/14   Historical Provider, MD  aspirin EC 81 MG tablet Take 81 mg by mouth daily.    Historical Provider, MD  B-D UF III MINI PEN NEEDLES 31G X 5 MM MISC  01/21/15   Historical Provider, MD  Blood Glucose Monitoring Suppl (ACCU-CHEK AVIVA PLUS) W/DEVICE KIT  01/16/15   Historical Provider, MD  cephALEXin (KEFLEX) 500 MG capsule Take 1 capsule (500 mg total) by mouth 3 (three) times daily. 04/11/15   Curt Bears  P [place], DPM  HUMULIN 70/30 (70-30) 100 UNIT/ML injection  04/12/15   Historical Provider, MD  HUMULIN 70/30 KWIKPEN (70-30) 100 UNIT/ML PEN Inject 5-20 Units into the skin 2 (two) times daily as needed (CBG over 175).  11/27/14   Historical Provider, MD  HYDROcodone-acetaminophen (NORCO) 10-325 MG per tablet Take 1 tablet by mouth every 6 (six) hours as needed. 01/14/15   Alvia Grove, PA-C  insulin glargine (LANTUS) 100 unit/mL SOPN Inject 25 Units into the skin at bedtime.    Historical Provider, MD  LANTUS 100 UNIT/ML injection  04/08/15   Historical Provider, MD  lisinopril (PRINIVIL,ZESTRIL) 20 MG tablet Take 20 mg by mouth daily.     Historical Provider, MD  metoCLOPramide (REGLAN) 5 MG tablet Take 5 mg by mouth 4 (four) times daily -  before meals and at bedtime.    Historical Provider, MD  oxyCODONE (OXY IR/ROXICODONE) 5 MG immediate release tablet Take 1 tablet (5 mg total) by mouth every 8 (eight) hours as needed for severe pain. Patient not taking: Reported on 04/17/2015 01/04/15   Conrad [place], MD  pregabalin (LYRICA) 75 MG capsule Take 75 mg by mouth 3 (three) times daily.    Historical Provider, MD  ranitidine (ZANTAC) 150 MG tablet Take 150 mg by mouth daily.  01/24/14   Historical Provider, MD  sevelamer (RENVELA) 800 MG tablet Take 3,200 mg by mouth 3 (three) times daily with meals.     Historical Provider, MD  silver sulfADIAZINE (SILVADENE) 1 % cream Apply 1 application topically daily. 03/29/15   Bronson Ing, DPM  simvastatin (ZOCOR) 40 MG tablet Take 40 mg by mouth daily.     Historical Provider, MD  Flossie Buffy INSULIN SYRINGE 29G X 1/2" 0.5 ML MISC  04/08/15   Historical Provider, MD   BP 215/91 mmHg  Pulse 91  Temp(Src) 98.5 F (36.9 C) (Oral)  Resp 20  Ht _0  (1.626 m)  SpO2 95% Physical Exam  ED Course  Procedures (including critical care time) Labs Review Labs Reviewed  CBC WITH DIFFERENTIAL/PLATELET  BASIC METABOLIC PANEL  I-STAT TROPOININ, ED    Imaging Review No results found.   EKG Interpretation   Date/Time:  Tuesday May 14 2015 11:06:06 EDT Ventricular Rate:  87 PR Interval:  113 QRS Duration: 98 QT Interval:  457 QTC Calculation: 550 R Axis:   -139 Text Interpretation:  Right and left arm electrode reversal,  interpretation assumes no reversal Sinus rhythm Borderline short PR  interval Right axis deviation Consider left ventricular hypertrophy  Abnormal T, consider ischemia, anterior leads Prolonged QT interval  Confirmed by Winfred Leeds  MD, Kahliya Fraleigh [phone]) on 05/14/2015 11:31:43 AM     See today's  ED note from Quincy Carnes MDM  Patient with acute EKG changes and positive  troponin. He also has necrotic toes of left foot. He will be admitted to medical service. Cardiology and nephrology consultations called Medical screening examination/treatment/procedure(s) were conducted as a shared visit with non-physician practitioner(s) and myself.  I personally evaluated the patient during the encounter.   EKG Interpretation   Date/Time:  Tuesday May 14 2015 11:06:06 EDT Ventricular Rate:  87 PR Interval:  113 QRS Duration: 98 QT Interval:  457 QTC Calculation: 550 R Axis:   -139 Text Interpretation:  Right and left arm electrode reversal,  interpretation assumes no reversal Sinus rhythm Borderline short PR  interval Right axis deviation Consider left ventricular hypertrophy  Abnormal T, consider ischemia, anterior leads Prolonged QT interval  Confirmed by Winfred Leeds  MD, [place] [phone]) on 05/14/2015 11:31:43 AM      Final diagnoses:  Generalized weakness        Orlie Dakin, MD 05/14/15 1806

## 2015-05-15 DIAGNOSIS — R7989 Other specified abnormal findings of blood chemistry: Secondary | ICD-10-CM | POA: Diagnosis present

## 2015-05-15 DIAGNOSIS — I160 Hypertensive urgency: Secondary | ICD-10-CM | POA: Diagnosis present

## 2015-05-15 DIAGNOSIS — G9340 Encephalopathy, unspecified: Secondary | ICD-10-CM | POA: Diagnosis present

## 2015-05-15 DIAGNOSIS — Z992 Dependence on renal dialysis: Secondary | ICD-10-CM

## 2015-05-15 DIAGNOSIS — R778 Other specified abnormalities of plasma proteins: Secondary | ICD-10-CM | POA: Diagnosis present

## 2015-05-15 DIAGNOSIS — I10 Essential (primary) hypertension: Secondary | ICD-10-CM

## 2015-05-15 DIAGNOSIS — N186 End stage renal disease: Secondary | ICD-10-CM | POA: Diagnosis present

## 2015-05-15 DIAGNOSIS — I248 Other forms of acute ischemic heart disease: Secondary | ICD-10-CM

## 2015-05-15 LAB — CBC
HEMATOCRIT: 33.2 % — AB (ref 39.0–52.0)
Hemoglobin: 10.9 g/dL — ABNORMAL LOW (ref 13.0–17.0)
MCH: 30.9 pg (ref 26.0–34.0)
MCHC: 32.8 g/dL (ref 30.0–36.0)
MCV: 94.1 fL (ref 78.0–100.0)
Platelets: 182 10*3/uL (ref 150–400)
RBC: 3.53 MIL/uL — ABNORMAL LOW (ref 4.22–5.81)
RDW: 15 % (ref 11.5–15.5)
WBC: 15.9 10*3/uL — AB (ref 4.0–10.5)

## 2015-05-15 LAB — BASIC METABOLIC PANEL
Anion gap: 16 — ABNORMAL HIGH (ref 5–15)
BUN: 39 mg/dL — ABNORMAL HIGH (ref 6–20)
CO2: 31 mmol/L (ref 22–32)
Calcium: 8.4 mg/dL — ABNORMAL LOW (ref 8.9–10.3)
Chloride: 93 mmol/L — ABNORMAL LOW (ref 101–111)
Creatinine, Ser: 7.06 mg/dL — ABNORMAL HIGH (ref 0.61–1.24)
GFR, EST AFRICAN AMERICAN: 9 mL/min — AB (ref >60)
GFR, EST NON AFRICAN AMERICAN: 7 mL/min — AB (ref >60)
Glucose, Bld: 127 mg/dL — ABNORMAL HIGH (ref 65–99)
POTASSIUM: 3.9 mmol/L (ref 3.5–5.1)
SODIUM: 140 mmol/L (ref 135–145)

## 2015-05-15 LAB — GLUCOSE, CAPILLARY
GLUCOSE-CAPILLARY: 197 mg/dL — AB (ref 65–99)
Glucose-Capillary: 91 mg/dL (ref 65–99)
Glucose-Capillary: 107 mg/dL — ABNORMAL HIGH (ref 65–99)
Glucose-Capillary: 146 mg/dL — ABNORMAL HIGH (ref 65–99)

## 2015-05-15 LAB — SURGICAL PCR SCREEN
MRSA, PCR: NEGATIVE
Staphylococcus aureus: NEGATIVE

## 2015-05-15 LAB — HEMOGLOBIN A1C
Hgb A1c MFr Bld: 6.2 % — ABNORMAL HIGH (ref 4.8–5.6)
Mean Plasma Glucose: 131 mg/dL

## 2015-05-15 LAB — TROPONIN I: TROPONIN I: 0.72 ng/mL — AB (ref <0.031)

## 2015-05-15 MED ORDER — DEXTROSE 5 % IV SOLN: 1.5000 g | INTRAVENOUS | Status: DC

## 2015-05-15 MED ORDER — NICOTINE 21 MG/24HR TD PT24
21.0000 mg | MEDICATED_PATCH | TRANSDERMAL | Status: DC
  Administered 2015-05-15 – 2015-05-24 (×10): 21 mg via TRANSDERMAL
  Filled 2015-05-15 (×10): qty 1

## 2015-05-15 MED ORDER — SODIUM CHLORIDE 0.9 % IV SOLN: INTRAVENOUS | Status: DC

## 2015-05-15 MED ORDER — ENSURE ENLIVE PO LIQD
237.0000 mL | Freq: Two times a day (BID) | ORAL | Status: DC
  Administered 2015-05-15 – 2015-05-24 (×10): 237 mL via ORAL

## 2015-05-15 MED ORDER — CEFUROXIME SODIUM 1.5 G IJ SOLR
1.5000 g | INTRAMUSCULAR | Status: AC
  Filled 2015-05-15: qty 1.5

## 2015-05-15 MED ORDER — VANCOMYCIN HCL 500 MG IV SOLR
500.0000 mg | INTRAVENOUS | Status: DC
  Administered 2015-05-16 – 2015-05-18 (×2): 500 mg via INTRAVENOUS
  Filled 2015-05-15 (×5): qty 500

## 2015-05-15 NOTE — Progress Notes (Signed)
Pt allowed to eat, per MD order

## 2015-05-15 NOTE — Evaluation (Signed)
Physical Therapy Evaluation Patient Details Name: Garrett Salas MRN: [id number] DOB: 1951/04/10 Today's Date: 05/15/2015   History of Present Illness  Garrett Salas is a 64 y.o. male with history of ESRD on hemodialysis TTS, peripheral vascular disease, right BKA, diabetes who presents with generalized weakness, subjective fevers, left foot pain and gangrene. The patient's family and home health nurse reported that he was not active normally with decreased oral intake for the past several days with progressive weakness. In ED, found to have elevated troponin 1.17 with EKG changes. Denies any chest pain  Clinical Impression  Pt admitted with above diagnosis. Pt currently with functional limitations due to the deficits listed below (see PT Problem List). Pt with generalized weakness and slow processing which is currently affecting mobility and will also make mobility after surgery difficult.  Pt will benefit from skilled PT to increase their independence and safety with mobility to allow discharge to the venue listed below.       Follow Up Recommendations Home health PT;Supervision for mobility/OOB, this is based on current LOF, will likely be amended to higher level of rehab after surgery.    Equipment Recommendations  Other (comment) (TBD)    Recommendations for Other Services OT consult     Precautions / Restrictions Precautions Precautions: Fall Restrictions Weight Bearing Restrictions: No      Mobility  Bed Mobility Overal bed mobility: Needs Assistance Bed Mobility: Supine to Sit     Supine to sit: Min assist     General bed mobility comments: could not get to EOB on his own with bed rail, was able to achieve partial sitting position with a firm hand to grasp but then could not scoot hips to EOB without assist. Returned to SL and therapist helped pt slide hips fwd first and them min  A to elevate trunk and pt could participate more with scooting EOB as he was further out.    Transfers Overall transfer level: Needs assistance Equipment used: Rolling walker (2 wheeled) Transfers: Sit to/from [facility] Sit to Stand: +2 physical assistance;Mod assist Stand pivot transfers: Min assist;+2 physical assistance       General transfer comment: pt had difficulty donning right prosthesis, could not get clicked in, assisted by therapist with this, had diffiuclty problem solving task. Mod A +2 for sit to stand and pt with inability to fully straighten right knee. Lacked upper body strength and coordination to take full wt and hop on prosthesis so still relying on LLE to pivot to chair, despite vc's for NWB on that side. Will need to learn sliding board transfers after surgery.   Ambulation/Gait             General Gait Details: unable  Stairs            Wheelchair Mobility    Modified Rankin (Stroke Patients Only)       Balance Overall balance assessment: Needs assistance Sitting-balance support: Bilateral upper extremity supported Sitting balance-Leahy Scale: Poor Sitting balance - Comments: left lean, required intermittent min A to maintain sitting EOB Postural control: Left lateral lean Standing balance support: Bilateral upper extremity supported Standing balance-Leahy Scale: Poor Standing balance comment: required min A with RW to maintain standing and could only tolerate for short time                             Pertinent Vitals/Pain Pain Assessment: 0-10 Pain Score: 9  Pain Location: left  foot Pain Intervention(s): Monitored during session;Repositioned    Home Living Family/patient expects to be discharged to:: Private residence Living Arrangements: Spouse/significant other;Children Available Help at Discharge: Family;Available PRN/intermittently;Personal care attendant Type of Home: Mobile home Home Access: Ramped entrance     Home Layout: One level Home Equipment: [place] - 2 wheels;Cane - single  point;Shower seat;Bedside commode;Wheelchair - Press photographer      Prior Function Level of Independence: Needs assistance   Gait / Transfers Assistance Needed: pt reports he was independent with RW though did not use it much, spent more time in w/c  ADL's / Homemaking Assistance Needed: wife helps as needed        Hand Dominance        Extremity/Trunk Assessment   Upper Extremity Assessment: Defer to OT evaluation;Generalized weakness           Lower Extremity Assessment: LLE deficits/detail;RLE deficits/detail;Generalized weakness RLE Deficits / Details: pt with full hip flex and ext to neutral, had difficulty fully extending right knee in standing due to weakness LLE Deficits / Details: partial knee flexion contracture, can move extremity against gravity, not tested further due to pain and gangrene. Foul smelling  Cervical / Trunk Assessment: Kyphotic  Communication   Communication: No difficulties  Cognition Arousal/Alertness: Awake/alert Behavior During Therapy: WFL for tasks assessed/performed Overall Cognitive Status: Impaired/Different from baseline Area of Impairment: Following commands;Problem solving       Following Commands: Follows one step commands consistently;Follows one step commands with increased time;Follows multi-step commands consistently;Follows multi-step commands with increased time     Problem Solving: Slow processing;Difficulty sequencing;Requires verbal cues;Requires tactile cues General Comments: pt had difficulty donning prosthesis and he report that this is not normal for him. Was verbally and physically slow to repond to questions and commands.     General Comments      Exercises        Assessment/Plan    PT Assessment Patient needs continued PT services  PT Diagnosis Difficulty walking;Generalized weakness;Acute pain;Altered mental status   PT Problem List Decreased strength;Decreased range of motion;Decreased activity  tolerance;Decreased balance;Decreased mobility;Decreased coordination;Decreased cognition;Decreased knowledge of use of DME;Decreased safety awareness;Decreased knowledge of precautions;Pain;Impaired sensation  PT Treatment Interventions DME instruction;Functional mobility training;Therapeutic activities;Therapeutic exercise;Balance training;Patient/family education   PT Goals (Current goals can be found in the Care Plan section) Acute Rehab PT Goals Patient Stated Goal: return home PT Goal Formulation: With patient Time For Goal Achievement: 05/29/15 Potential to Achieve Goals: Good    Frequency Min 3X/week   Barriers to discharge        Co-evaluation               End of Session Equipment Utilized During Treatment: Gait belt Activity Tolerance: Patient tolerated treatment well Patient left: in chair;with call bell/phone within reach Nurse Communication: Mobility status         Time: 0840-0907 PT Time Calculation (min) (ACUTE ONLY): 27 min   Charges:   PT Evaluation $Initial PT Evaluation Tier I: 1 Procedure PT Treatments $Therapeutic Activity: 8-22 mins   PT G Codes:      Leighton Roach, PT  Acute Rehab Services  [place], [place] 05/15/2015, 9:58 AM

## 2015-05-15 NOTE — Progress Notes (Signed)
      Pending angiogram today and plan of care for revascularization options verses transmetatarsal amputation or possible BKA of the left LE.  Kathern Lobosco MAUREEN PA-C

## 2015-05-15 NOTE — Progress Notes (Signed)
Obtained consent from pt for amputation of left leg tomorrow, pt alert and oriented X4. Spoke with Dr. Bridgett Larsson, prefers consent to be obtained from wife. Could not reach pt's wife. MD aware, will proceede with the consent obtained. If wife calls back, will obtain her consent.

## 2015-05-15 NOTE — Progress Notes (Addendum)
   Daily Progress Note  Assessment/Planning: L foot wet gangrene, AMS   Pt has significant anterior shin TTP c/w ascending cellulitis from his foot  IMHO, this patient's L foot is NOT salvageable.  Pt's mental status is altered compared to his baseline.  I suspect some degree of encephalopathy related to likely bacteremia from his L foot gangrene.  I again offered this patient a L BKA vs AKA.  If I found frank infection during a BKA incision, I would immediately proceed to a AKA.  Pt is "considering" his options.  I will contact his family.  Will tenatively book for tomorrow to keep a operative slot available.  If he elects to proceed with palliative care, will cancel   Subjective   Left foot pain worsened since office  Objective Filed Vitals:   05/14/15 2233 05/15/15 0100 05/15/15 0455 05/15/15 0649  BP: 186/81 174/77 204/95 159/74  Pulse:   87   Temp:   98.6 F (37 C)   TempSrc:   Oral   Resp:      Height:      Weight:   111 lb 8.8 oz (50.6 kg)   SpO2:   100%     Intake/Output Summary (Last 24 hours) at 05/15/15 0850 Last data filed at 05/14/15 2300  Gross per 24 hour  Intake    240 ml  Output   3500 ml  Net  -3260 ml    VASC  L foot wet gangrene (fluc fluid collection with foul smell), ischemic toes with sloughing skin, +TTP anterior shin, L knee partial contracture  Laboratory CBC    Component Value Date/Time   WBC 15.9* 05/15/2015 0340   HGB 10.9* 05/15/2015 0340   HCT 33.2* 05/15/2015 0340   PLT 182 05/15/2015 0340    BMET    Component Value Date/Time   NA 140 05/15/2015 0340   K 3.9 05/15/2015 0340   CL 93* 05/15/2015 0340   CO2 31 05/15/2015 0340   GLUCOSE 127* 05/15/2015 0340   BUN 39* 05/15/2015 0340   CREATININE 7.06* 05/15/2015 0340   CALCIUM 8.4* 05/15/2015 0340   GFRNONAA 7* 05/15/2015 0340   GFRAA 9* 05/15/2015 Clever, MD [facility]: [phone] Pager:  [phone]  05/15/2015, 8:50 AM  Addendum  Call spouse's cellular phone: line disconnected.  Called spouse's phone number: no answer, no answering machine.  Adele Barthel, MD [facility] Office: [phone] Pager: [phone]  05/15/2015, 8:57 AM

## 2015-05-15 NOTE — Progress Notes (Signed)
Subjective:  Some Continued foot pain / tells me ready for amputation / tolerated HD yest.   Objective Vital signs in last 24 hours: Filed Vitals:   05/14/15 2233 05/15/15 0100 05/15/15 0455 05/15/15 0649  BP: 186/81 174/77 204/95 159/74  Pulse:   87   Temp:   98.6 F (37 C)   TempSrc:   Oral   Resp:      Height:      Weight:   50.6 kg (111 lb 8.8 oz)   SpO2:   100%    Weight change:   Physical Exam: General:Alert, thin , chronically  Ill BM , NAD Appears Baseline MS OX3 Heart: RRR , no mur , rub, or gallop Lungs: CTA bilat , non labored breathing  Abdomen: soft NT, ND  Extremities: no pedal edema Left Or Edema of stump R BKA/ L foot not unwrapped  Dialysis Access: pos. Bruit R UA AVF   OP Dialysis Orders: Center: [facility] on tts . EDW 52 kg HD Bath 2k/2.25ca Time 3.5 hrs Heparin 0 Access R UA AVF BFR 400 DFR A  PO Calcitriol 0.5 mcg /HD Mircera 50 mcg Q 4 wks next on 05/14/15 Units IV/HD Venofer 100 x5 last dose 05/22/14  Other Op labs hgb 11.2 05/09/15 tfs 10%   Problem/Plan: 1. L Lower foot Gangreene/ PVD ( ho R BKA) =  Dr. Bridgett Larsson  Seeing Lenore Manner in am / iv Zosyn  And vanco  2. Positive troponin and Abnormal Ekg changes - Per admit / Card eval In progress/ CE trending down  3. Reported AMS - Now resolved in ER / multilple etiologies Hypoxia with Infection ( gang. Toes) 4. ESRD - HD TTS hd  Tomor. Work around surgery / am k 3.9 5. Hypertension/volume - bp up on admit sec pain /, ??bp med compliance and did have 3500 ccuf with hd  Now bp 159/74 / leaving below edw at op unit Wt loss with gangr./ lower edw at dc 48.9 post wt yest . 6. Anemia - hgb 10.9 on low dose esa and fe load 7. Metabolic bone disease - Vit d On hd and binders (renvela)_ 8. IIDDM- per admit 9. Nutrition - renal ,carb mod Renal vitamin   Ernest Haber, PA-C [facility] Beeper [phone] 05/15/2015,11:09 AM  LOS: 1 day   Pt seen, examined, agree w assess/plan  as above with additions as indicated.  Kelly Splinter MD pager [phone]    cell [phone] 05/15/2015, 1:47 PM     Labs: Basic Metabolic Panel:  Recent Labs Lab 05/14/15 1122 05/14/15 1128 05/15/15 0340  NA 138 133* 140  K 4.5 4.4 3.9  CL 92* 96* 93*  CO2 26  --  31  GLUCOSE 176* 176* 127*  BUN 64* 58* 39*  CREATININE 10.13* 10.10* 7.06*  CALCIUM 8.4*  --  8.4*  CBC:  Recent Labs Lab 05/14/15 1122 05/14/15 1128 05/15/15 0340  WBC 14.3*  --  15.9*  NEUTROABS 12.4*  --   --   HGB 10.0* 10.9* 10.9*  HCT 30.2* 32.0* 33.2*  MCV 94.4  --  94.1  PLT 160  --  182   Cardiac Enzymes:  Recent Labs Lab 05/14/15 1122 05/14/15 1558 05/14/15 2210 05/15/15 0340  TROPONINI 1.17* 1.05* 0.86* 0.72*   CBG:  Recent Labs Lab 05/14/15 1607 05/14/15 2143 05/15/15 0733  GLUCAP 152* 153* 91     Medications:   . amphetamine-dextroamphetamine  5 mg Oral Q breakfast  . aspirin EC  81  mg Oral Daily  . [START ON 05/16/2015] calcitRIOL  0.5 mcg Oral Q T,Th,Sa-HD  . [START ON 05/16/2015] darbepoetin (ARANESP) injection - DIALYSIS  60 mcg Intravenous Q Thu-HD  . docusate sodium  100 mg Oral BID  . famotidine  20 mg Oral Daily  . [START ON 05/16/2015] ferric gluconate (FERRLECIT/NULECIT) IV  62.5 mg Intravenous Q T,Th,Sa-HD  . heparin  5,000 Units Subcutaneous 3 times per day  . insulin aspart  0-9 Units Subcutaneous TID WC  . insulin glargine  25 Units Subcutaneous QHS  . lisinopril  20 mg Oral Daily  . metoCLOPramide  5 mg Oral TID AC & HS  . piperacillin-tazobactam (ZOSYN)  IV  2.25 g Intravenous Q8H  . sevelamer carbonate  3,200 mg Oral TID WC  . simvastatin  40 mg Oral q1800  . sodium chloride  3 mL Intravenous Q12H  . sodium chloride  3 mL Intravenous Q12H  . [START ON 05/16/2015] vancomycin  500 mg Intravenous Q T,Th,Sa-HD

## 2015-05-15 NOTE — Progress Notes (Signed)
   Daily Progress Note  Still not able to get in contact with pt's spouse.  Will keep him on schedule for tomorrow, but obviously cannot proceed without consent.   Adele Barthel, MD [facility] Office: [phone] Pager: [phone]  05/15/2015, 1:05 PM

## 2015-05-15 NOTE — Progress Notes (Signed)
UR Completed Louanne Calvillo Graves-Bigelow, RN,BSN [phone]  

## 2015-05-15 NOTE — Progress Notes (Signed)
ANTIBIOTIC CONSULT NOTE - F/U  Pharmacy Consult for vancomycin and zosyn Indication: left foot gangrene  No Known Allergies  Patient Measurements: Height: 5\' 4"  (162.6 cm) Weight: 111 lb 8.8 oz (50.6 kg) IBW/kg (Calculated) : 59.2 Adjusted Body Weight:   Vital Signs: Temp: 98.6 F (37 C) (06/29 0455) Temp Source: Oral (06/29 0455) BP: 159/74 mmHg (06/29 0649) Pulse Rate: 87 (06/29 0455) Intake/Output from previous day: 06/28 0701 - 06/29 0700 In: 240 [P.O.:240] Out: 3500  Intake/Output from this shift: Total I/O In: 240 [P.O.:240] Out: -   Labs:  Recent Labs  05/14/15 1122 05/14/15 1128 05/15/15 0340  WBC 14.3*  --  15.9*  HGB 10.0* 10.9* 10.9*  PLT 160  --  182  CREATININE 10.13* 10.10* 7.06*   Estimated Creatinine Clearance: 7.7 mL/min (by C-G formula based on Cr of 7.06). No results for input(s): VANCOTROUGH, VANCOPEAK, VANCORANDOM, GENTTROUGH, GENTPEAK, GENTRANDOM, TOBRATROUGH, TOBRAPEAK, TOBRARND, AMIKACINPEAK, AMIKACINTROU, AMIKACIN in the last 72 hours.   Microbiology: Recent Results (from the past 720 hour(s))  Culture, blood (routine x 2)     Status: None (Preliminary result)   Collection Time: 05/14/15 11:22 AM  Result Value Ref Range Status   Specimen Description BLOOD LEFT ANTECUBITAL  Final   Special Requests BOTTLES DRAWN AEROBIC ONLY 3CC  Final   Culture PENDING  Incomplete   Report Status PENDING  Incomplete    Medical History: Past Medical History  Diagnosis Date  . Diabetes mellitus     Type 2. Diagnosed in mid 48s. Currently insulin requiring  . Hyperlipidemia   . ESRD (end stage renal disease) on dialysis     due to diabetic nephropathy. T/Th/Sat  . Tobacco abuse   . Diabetic neuropathy   . Diabetic retinopathy     legally blind  . Hypertension   . Dialysis patient   . Peripheral vascular disease   . Hx of right BKA     Medications:  Scheduled:  . amphetamine-dextroamphetamine  5 mg Oral Q breakfast  . aspirin EC  81 mg  Oral Daily  . [START ON 05/16/2015] calcitRIOL  0.5 mcg Oral Q T,Th,Sa-HD  . [START ON 05/16/2015] darbepoetin (ARANESP) injection - DIALYSIS  60 mcg Intravenous Q Thu-HD  . docusate sodium  100 mg Oral BID  . famotidine  20 mg Oral Daily  . [START ON 05/16/2015] ferric gluconate (FERRLECIT/NULECIT) IV  62.5 mg Intravenous Q T,Th,Sa-HD  . heparin  5,000 Units Subcutaneous 3 times per day  . insulin aspart  0-9 Units Subcutaneous TID WC  . insulin glargine  25 Units Subcutaneous QHS  . lisinopril  20 mg Oral Daily  . metoCLOPramide  5 mg Oral TID AC & HS  . piperacillin-tazobactam (ZOSYN)  IV  2.25 g Intravenous Q8H  . sevelamer carbonate  3,200 mg Oral TID WC  . simvastatin  40 mg Oral q1800  . sodium chloride  3 mL Intravenous Q12H  . sodium chloride  3 mL Intravenous Q12H  . [START ON 05/16/2015] vancomycin  750 mg Intravenous Q T,Th,Sa-HD   Infusions:   Assessment: 64 yo male presenting on 6/28 with weakness, decrease appetite, and abnormal behavior.   PMH: ESRD on HD TTS, PVD sp BKA, DM, dry gangrene of left foot  (potential amputation in mid July)  ID: gangrene of left foot with acute infection. Pending BKA vs AKA Thursday. WBC 15.9, AF  Zosyn 6/28>> Vanc 6/28>>  Blood 6/28 x 2  Goal of Therapy:  pre-HD vanc level 15-25 mcg/mL  Plan:  -Decrease vancomycin to 500 mg with HD on T/T/Sa since weight has been changed to 50kg -Continue zosyn -F/U cultures, LOT, potential AKA/BKA  Levester Fresh, PharmD, [facility] Clinical Pharmacist Pager [phone] 05/15/2015 10:47 AM

## 2015-05-15 NOTE — Progress Notes (Signed)
Initial Nutrition Assessment  DOCUMENTATION CODES:  Severe malnutrition in context of chronic illness  INTERVENTION:  Ensure Enlive (each supplement provides 350kcal and 20 grams of protein) BID  Encourage adequate PO intake.   NUTRITION DIAGNOSIS:  Malnutrition related to chronic illness as evidenced by percent weight loss, severe depletion of body fat, severe depletion of muscle mass.  GOAL:  Patient will meet greater than or equal to 90% of their needs   MONITOR:  PO intake, Weight trends, Supplement acceptance, Labs, I & O's  REASON FOR ASSESSMENT:  Malnutrition Screening Tool    ASSESSMENT: Pt with end-stage renal disease (hemodialysis T/T/S), peripheral vascular disease status post right BKA, diabetes mellitus with neuropathy and retinopathy who presents to the emergency department with weakness and abnormal behavior.   Pt reports having a good appetite currently and PTA at home consuming at least 3 meals a day and drinking Ensure TID. Meal completion has been 75%. Pt reports usual body weight of ~130 lbs. Per Epic weight records, pt with a 14.6% weight loss in 1 month. Pt is unaware of weight loss. RD to order Ensure to aid in caloric and protein needs as well as in wound healing. Plans for pt L BKA tomorrow as pt with gangrene of foot. Pt was encouraged to eat his food at meals and to drink his supplements.   Nutrition-Focused physical exam completed. Findings are severe fat depletion, severe muscle depletion, and no edema.   Labs: Low calcium, chloride, and GFR. High BUN and creatinine.   Height:  Ht Readings from Last 1 Encounters:  05/14/15 5\' 4"  (1.626 m)    Weight:  Wt Readings from Last 1 Encounters:  05/15/15 111 lb 8.8 oz (50.6 kg)    Ideal Body Weight:  55.2 kg (adjusted R BKA)  Wt Readings from Last 10 Encounters:  05/15/15 111 lb 8.8 oz (50.6 kg)  04/17/15 130 lb (58.968 kg)  02/07/15 123 lb (55.792 kg)  01/14/15 108 lb 0.4 oz (49 kg)   01/04/15 123 lb 7.3 oz (56 kg)  05/18/11 124 lb (56.246 kg)  04/27/11 125 lb (56.7 kg)  03/25/11 120 lb (54.432 kg)    BMI:  Body mass index is 19.14 kg/(m^2).  Estimated Nutritional Needs:  Kcal:  1800-2000  Protein:  75-90 grams  Fluid:  Per MD  Skin:   (R BKA, gangrene of L foot)  Diet Order:  Diet heart healthy/carb modified Room service appropriate?: Yes; Fluid consistency:: Thin Diet NPO time specified Except for: Sips with Meds  EDUCATION NEEDS:  No education needs identified at this time   Intake/Output Summary (Last 24 hours) at 05/15/15 1416 Last data filed at 05/15/15 1000  Gross per 24 hour  Intake    480 ml  Output   3500 ml  Net  -3020 ml    Last BM:  6/27  Corrin Parker, MS, RD, LDN Pager # [phone] After hours/ weekend pager # [phone]

## 2015-05-15 NOTE — Progress Notes (Signed)
Subjective: No CP and never had any.  Objective: Vital signs in last 24 hours: Temp:  [98 F (36.7 C)-98.9 F (37.2 C)] 98.6 F (37 C) (06/29 0455) Pulse Rate:  [73-91] 87 (06/29 0455) Resp:  [13-20] 18 (06/28 1940) BP: (159-221)/(74-105) 159/74 mmHg (06/29 0649) SpO2:  [89 %-100 %] 100 % (06/29 0455) Weight:  [107 lb 12.9 oz (48.9 kg)-117 lb 1.6 oz (53.116 kg)] 111 lb 8.8 oz (50.6 kg) (06/29 0455) Last BM Date: 05/13/15  Intake/Output from previous day: 06/28 0701 - 06/29 0700 In: 240 [P.O.:240] Out: 3500  Intake/Output this shift:    Medications Scheduled Meds: . amphetamine-dextroamphetamine  5 mg Oral Q breakfast  . aspirin EC  81 mg Oral Daily  . bisacodyl  10 mg Oral Once  . [START ON 05/16/2015] calcitRIOL  0.5 mcg Oral Q T,Th,Sa-HD  . [START ON 05/16/2015] darbepoetin (ARANESP) injection - DIALYSIS  60 mcg Intravenous Q Thu-HD  . docusate sodium  100 mg Oral BID  . famotidine  20 mg Oral Daily  . [START ON 05/16/2015] ferric gluconate (FERRLECIT/NULECIT) IV  62.5 mg Intravenous Q T,Th,Sa-HD  . heparin  5,000 Units Subcutaneous 3 times per day  . insulin aspart  0-9 Units Subcutaneous TID WC  . insulin glargine  25 Units Subcutaneous QHS  . lisinopril  20 mg Oral Daily  . metoCLOPramide  5 mg Oral TID AC & HS  . piperacillin-tazobactam (ZOSYN)  IV  2.25 g Intravenous Q8H  . sevelamer carbonate  3,200 mg Oral TID WC  . simvastatin  40 mg Oral q1800  . sodium chloride  3 mL Intravenous Q12H  . sodium chloride  3 mL Intravenous Q12H  . [START ON 05/16/2015] vancomycin  750 mg Intravenous Q T,Th,Sa-HD   Continuous Infusions:  PRN Meds:.sodium chloride, acetaminophen **OR** acetaminophen, hydrALAZINE, HYDROcodone-acetaminophen, ipratropium-albuterol, sodium chloride  PE: General appearance: alert, cooperative and Pain in the left lower ext.  Lungs: Corase crackles Heart: regular rate and rhythm, S1, S2 normal, no murmur, click, rub or gallop Extremities:  -LEE Pulses: 2+ Radials Skin: Necrotic toes on the left LE Neurologic: Grossly normal  Lab Results:   Recent Labs  05/14/15 1122 05/14/15 1128 05/15/15 0340  WBC 14.3*  --  15.9*  HGB 10.0* 10.9* 10.9*  HCT 30.2* 32.0* 33.2*  PLT 160  --  182   BMET  Recent Labs  05/14/15 1122 05/14/15 1128 05/15/15 0340  NA 138 133* 140  K 4.5 4.4 3.9  CL 92* 96* 93*  CO2 26  --  31  GLUCOSE 176* 176* 127*  BUN 64* 58* 39*  CREATININE 10.13* 10.10* 7.06*  CALCIUM 8.4*  --  8.4*   Cardiac Panel (last 3 results)  Recent Labs  05/14/15 1558 05/14/15 2210 05/15/15 0340  TROPONINI 1.05* 0.86* 0.72*    Assessment/Plan 64 y.o. male with a history of HTN, HL, DM with neuropathy, peripheral vascular disease s/p right BKA, ESRD on HD (Tuesday, Thursday,Saturday), who brought by EMS to the ED for family reported AMS.  LHC in 2012 which revealed mild luminal irregularities without any evidence of obstructive coronary artery disease and normal LVF. Echo 12/2014 showed LV EF of 55-60%; severe LVH; no WM abnormality; grade 2 DD; mild aortic reg.  Principal Problem:   Gangrene of foot Active Problems:   Hypertension   ESRD on dialysis   HLD (hyperlipidemia)   DM (diabetes mellitus) type II controlled peripheral vascular disorder   PAD (peripheral artery disease)  Generalized weakness   Hypertensive heart disease   Gangrene of lower extremity  Troponin trending down from 1.17. Never had chest.  Likely from severely elevated BP which at 0401hrs was 204/95.  Last was better(159/74).   Lisinopril 20, ASA, zocor.  Will increase lisinopril to 40mg .  HD yesterday.  Left AKA/BKA likely tomorrow.    LOS: 1 day    HAGER, BRYAN PA-C 05/15/2015 8:24 AM  Patient seen and examined and history reviewed. Agree with above findings and plan. Denies any chest pain. Foot painful. troponins mildly elevated and trending down. Ecg shows T wave changes c/w anterior ischemia with LVH. Prior cardiac  evaluation with cath 2012 was unremarkable. Echo earlier this year showed normal LV function. I suspect this represents demand ischemia from sepsis and severe HTN. I would focus on BP control. Plan amputation of gangrenous foot tomorrow. Cardiology will sign off now. Please call if we can be of further assistance.   Camren Lipsett [place], [place] 05/15/2015 10:35 AM

## 2015-05-15 NOTE — Progress Notes (Signed)
[facility] PROGRESS NOTE  Garrett Salas [id number] DOB: January 31, 1951 DOA: 05/14/2015 PCP: [name], MD  Assessment/Plan: #1 wet gangrene of the left foot Patient has been seen in consultation by vascular surgery patient noted to have some tenderness to palpation consistent with ascending cellulitis of his foot. It is felt per vascular surgery the patient's left foot is not salvageable. Patient has been offered left BKA versus AKA. Patient states he's thinking about it and is leaning towards amputation. Cultures pending. Continue empiric Antibiotics. Vascular surgery following.  #2 acute encephalopathy Likely secondary to problem #1. Patient alert oriented 4. No other source of infection. Patient blood cultures are pending. Continue empiric IV antibiotics.  #3 elevated troponin and EKG changes Patient has been seen in consultation by cardiology who patient likely had a demand ischemia secondary to his acute infection. Wet gangrene of the left foot with cellulitis.patient denies any chest pain. 2-D echo which was done early on showed a normal LV function. His felt that elevated troponin I EKG changes likely secondary to acute infection and severe hypertension. Cardiology feel no further cardiac workup is needed and to manage blood pressure. Appreciate cardiology and per recommendations.  #4 end-stage renal disease on hemodialysis Per nephrology.  #5 hypertensive urgency Some improvement with blood pressure post hemodialysis. Patient currently on lisinopril 20 mg daily.  #6 diabetes mellitus with neuropathy Continue Lantus and sliding scale insulin. Continue Reglan.  #7 generalized weakness Likely secondary to problem #1. PT/OT.  #8 prophylaxis Heparin for DVT prophylaxis.   Code Status: full Family Communication: updated patient. No family at bedside. Disposition Plan: home versus SNF pending patient's decision on amputation.   Consultants:  Vascular surgery:  Dr. Trula Slade 05/14/2015  Nephrology: Dr. Jonnie Finner 05/14/2015  Cardiology: Dr. Martinique 05/14/2015  Procedures:  MRI left foot 05/14/2015  Chest x-ray 05/14/2015  Antibiotics:  IV Zosyn 05/14/2015  IV vancomycin 05/14/2015  HPI/Subjective: Patient denies any chest pain. No change in shortness of breath. Patient states he is a little bit better than he did on admission. Patient alert and oriented 4. Patient still complaining of pain in the left foot/toes.  Objective: Filed Vitals:   05/15/15 0649  BP: 159/74  Pulse:   Temp:   Resp:     Intake/Output Summary (Last 24 hours) at 05/15/15 1145 Last data filed at 05/15/15 1000  Gross per 24 hour  Intake    480 ml  Output   3500 ml  Net  -3020 ml   Filed Weights   05/14/15 1625 05/14/15 1940 05/15/15 0455  Weight: 52.4 kg (115 lb 8.3 oz) 48.9 kg (107 lb 12.9 oz) 50.6 kg (111 lb 8.8 oz)    Exam:   General:  NAD  Cardiovascular: RRR  Respiratory: CTAB  Abdomen: Soft, nontender, nondistended, positive bowel sounds.  Musculoskeletal: No clubbing or cyanosis. Left foot bandaged.  Data Reviewed: Basic Metabolic Panel:  Recent Labs Lab 05/14/15 1122 05/14/15 1128 05/15/15 0340  NA 138 133* 140  K 4.5 4.4 3.9  CL 92* 96* 93*  CO2 26  --  31  GLUCOSE 176* 176* 127*  BUN 64* 58* 39*  CREATININE 10.13* 10.10* 7.06*  CALCIUM 8.4*  --  8.4*   Liver Function Tests: No results for input(s): AST, ALT, ALKPHOS, BILITOT, PROT, ALBUMIN in the last 168 hours. No results for input(s): LIPASE, AMYLASE in the last 168 hours. No results for input(s): AMMONIA in the last 168 hours. CBC:  Recent Labs Lab 05/14/15 1122 05/14/15 1128 05/15/15 0340  WBC 14.3*  --  15.9*  NEUTROABS 12.4*  --   --   HGB 10.0* 10.9* 10.9*  HCT 30.2* 32.0* 33.2*  MCV 94.4  --  94.1  PLT 160  --  182   Cardiac Enzymes:  Recent Labs Lab 05/14/15 1122 05/14/15 1558 05/14/15 2210 05/15/15 0340  TROPONINI 1.17* 1.05* 0.86* 0.72*    BNP (last 3 results) No results for input(s): BNP in the last 8760 hours.  ProBNP (last 3 results) No results for input(s): PROBNP in the last 8760 hours.  CBG:  Recent Labs Lab 05/14/15 1607 05/14/15 2143 05/15/15 0733 05/15/15 1112  GLUCAP 152* 153* 91 107*    Recent Results (from the past 240 hour(s))  Culture, blood (routine x 2)     Status: None (Preliminary result)   Collection Time: 05/14/15 11:22 AM  Result Value Ref Range Status   Specimen Description BLOOD LEFT ANTECUBITAL  Final   Special Requests BOTTLES DRAWN AEROBIC ONLY 3CC  Final   Culture PENDING  Incomplete   Report Status PENDING  Incomplete     Studies: Dg Chest 2 View  05/14/2015   CLINICAL DATA:  Shortness of Breath, history of diabetes, generalized weakness  EXAM: CHEST  2 VIEW  COMPARISON:  01/11/2015  FINDINGS: Borderline cardiomegaly. Mild interstitial prominence bilaterally without convincing pulmonary edema. No segmental infiltrate. No pneumothorax. Bony thorax is stable.  IMPRESSION: Mild interstitial prominence bilateral without convincing pulmonary edema. No segmental infiltrate.   Electronically Signed   By: Lahoma Crocker M.D.   On: 05/14/2015 12:28   Mr Foot Left Wo Contrast  05/15/2015   CLINICAL DATA:  Diabetic patient with gangrene of the left fluid left foot pain subjective fever. The patient's left second, third and fourth toes and base of the fifth toe are black and gangrenous.  EXAM: MRI OF THE LEFT FOREFOOT WITHOUT CONTRAST  TECHNIQUE: Multiplanar, multisequence MR imaging was performed. No intravenous contrast was administered.  COMPARISON:  Plain films of the right foot 01/02/2015.  FINDINGS: The study is degraded by patient motion. Subcutaneous edema is seen over the dorsum of the foot without focal fluid collection. No bone marrow signal abnormality to suggest osteomyelitis is identified. No evidence of soft tissue gas is seen. There is no fracture or other focal bony abnormality. No  notable arthropathy is seen.  IMPRESSION: Mild appearing subcutaneous edema over the dorsum of the foot. Negative for abscess or osteomyelitis.   Electronically Signed   By: Inge Rise M.D.   On: 05/15/2015 08:26    Scheduled Meds: . amphetamine-dextroamphetamine  5 mg Oral Q breakfast  . aspirin EC  81 mg Oral Daily  . [START ON 05/16/2015] calcitRIOL  0.5 mcg Oral Q T,Th,Sa-HD  . [START ON 05/16/2015] darbepoetin (ARANESP) injection - DIALYSIS  60 mcg Intravenous Q Thu-HD  . docusate sodium  100 mg Oral BID  . famotidine  20 mg Oral Daily  . [START ON 05/16/2015] ferric gluconate (FERRLECIT/NULECIT) IV  62.5 mg Intravenous Q T,Th,Sa-HD  . heparin  5,000 Units Subcutaneous 3 times per day  . insulin aspart  0-9 Units Subcutaneous TID WC  . insulin glargine  25 Units Subcutaneous QHS  . lisinopril  20 mg Oral Daily  . metoCLOPramide  5 mg Oral TID AC & HS  . piperacillin-tazobactam (ZOSYN)  IV  2.25 g Intravenous Q8H  . sevelamer carbonate  3,200 mg Oral TID WC  . simvastatin  40 mg Oral q1800  . sodium chloride  3 mL  Intravenous Q12H  . sodium chloride  3 mL Intravenous Q12H  . [START ON 05/16/2015] vancomycin  500 mg Intravenous Q T,Th,Sa-HD   Continuous Infusions:   Principal Problem:   Gangrene of foot Active Problems:   Hypertension   ESRD on dialysis   HLD (hyperlipidemia)   DM (diabetes mellitus) type II controlled peripheral vascular disorder   PAD (peripheral artery disease)   Generalized weakness   Hypertensive heart disease   Gangrene of lower extremity    Time spent: 3 minutes    Dionisia Pacholski M.D. [facility] Pager [phone]. If 7PM-7AM, please contact night-coverage at [email or web address], password [facility] 05/15/2015, 11:45 AM  LOS: 1 day

## 2015-05-15 NOTE — Progress Notes (Signed)
OT Cancellation Note  Patient Details Name: Demontrez Ardis MRN: [id number] DOB: 12/03/1950   Cancelled Treatment:    Reason Eval/Treat Not Completed: Other (comment). Pt planning to have surgery tomorrow. Plan to evaluate after surgery.  Benito Mccreedy OTR/L [id number] 05/15/2015, 2:41 PM

## 2015-05-15 NOTE — Care Management Note (Addendum)
Case Management Note  Patient Details  Name: Garrett Salas MRN: [id number] Date of Birth: [birth year]  Subjective/Objective:    Pt admitted for bacteremia from his L foot -gangrene. Plan for amputation 05-16-15.  Pt is from home with wife and son.                Action/Plan: Pt will benefit from SNF post surgical procedure. CM will continue to monitor for disposition needs.    Expected Discharge Date:                  Expected Discharge Plan:  Skilled Nursing Facility  In-House Referral:  Clinical Social Work  Discharge planning Services  CM Consult  Post Acute Care Choice:    Choice offered to:     DME Arranged:    DME Agency:     HH Arranged:    [place] Agency:     Status of Service:  In process, will continue to follow  Medicare Important Message Given:    Date Medicare IM Given:    Medicare IM give by:    Date Additional Medicare IM Given:    Additional Medicare Important Message give by:     If discussed at [place] of Stay Meetings, dates discussed:    Additional Comments:  1156 05-21-15 Jacqlyn Krauss, RN,BSN [phone] CM did speak with Liaison for CIR- Inpatient Rehab and recommendations are now for SNF. Upon assessment pt with increased confusion. Plan for CT scan of head today and HD later this evening. CM did speak with CSW in regards to SNF bed. Plan for SNF hopefully 05-22-15. No further needs from CM at this time.   Bethena Roys, RN 05/15/2015, 2:41 PM

## 2015-05-16 ENCOUNTER — Inpatient Hospital Stay (HOSPITAL_COMMUNITY): Payer: Medicare Other | Admitting: Certified Registered Nurse Anesthetist

## 2015-05-16 ENCOUNTER — Encounter (HOSPITAL_COMMUNITY)
Admission: EM | Disposition: A | Discharge status: Skilled Nursing Facility | Payer: Self-pay | Source: Home / Self Care | Attending: Internal Medicine

## 2015-05-16 ENCOUNTER — Encounter (HOSPITAL_COMMUNITY): Payer: Self-pay | Admitting: Certified Registered Nurse Anesthetist

## 2015-05-16 DIAGNOSIS — Z992 Dependence on renal dialysis: Secondary | ICD-10-CM | POA: Diagnosis not present

## 2015-05-16 DIAGNOSIS — E1129 Type 2 diabetes mellitus with other diabetic kidney complication: Secondary | ICD-10-CM | POA: Diagnosis not present

## 2015-05-16 DIAGNOSIS — N186 End stage renal disease: Secondary | ICD-10-CM | POA: Diagnosis not present

## 2015-05-16 HISTORY — PX: AMPUTATION: SHX166

## 2015-05-16 LAB — PROTIME-INR
INR: 1.11 (ref 0.00–1.49)
PROTHROMBIN TIME: 14.5 s (ref 11.6–15.2)

## 2015-05-16 LAB — CBC WITH DIFFERENTIAL/PLATELET
BASOS PCT: 0 % (ref 0–1)
Basophils Absolute: 0 10*3/uL (ref 0.0–0.1)
EOS ABS: 0.1 10*3/uL (ref 0.0–0.7)
EOS PCT: 1 % (ref 0–5)
HCT: 35.9 % — ABNORMAL LOW (ref 39.0–52.0)
Hemoglobin: 12 g/dL — ABNORMAL LOW (ref 13.0–17.0)
LYMPHS ABS: 1.5 10*3/uL (ref 0.7–4.0)
LYMPHS PCT: 9 % — AB (ref 12–46)
MCH: 31.3 pg (ref 26.0–34.0)
MCHC: 33.4 g/dL (ref 30.0–36.0)
MCV: 93.7 fL (ref 78.0–100.0)
Monocytes Absolute: 0.9 10*3/uL (ref 0.1–1.0)
Monocytes Relative: 5 % (ref 3–12)
NEUTROS PCT: 85 % — AB (ref 43–77)
Neutro Abs: 13.8 10*3/uL — ABNORMAL HIGH (ref 1.7–7.7)
Platelets: 197 10*3/uL (ref 150–400)
RBC: 3.83 MIL/uL — ABNORMAL LOW (ref 4.22–5.81)
RDW: 15.1 % (ref 11.5–15.5)
WBC: 16.3 10*3/uL — ABNORMAL HIGH (ref 4.0–10.5)

## 2015-05-16 LAB — COMPREHENSIVE METABOLIC PANEL
ALT: 11 U/L — AB (ref 17–63)
AST: 29 U/L (ref 15–41)
Albumin: 2.4 g/dL — ABNORMAL LOW (ref 3.5–5.0)
Alkaline Phosphatase: 69 U/L (ref 38–126)
Anion gap: 15 (ref 5–15)
BUN: 63 mg/dL — ABNORMAL HIGH (ref 6–20)
CO2: 27 mmol/L (ref 22–32)
Calcium: 8.1 mg/dL — ABNORMAL LOW (ref 8.9–10.3)
Chloride: 94 mmol/L — ABNORMAL LOW (ref 101–111)
Creatinine, Ser: 8.77 mg/dL — ABNORMAL HIGH (ref 0.61–1.24)
GFR calc Af Amer: 7 mL/min — ABNORMAL LOW (ref >60)
GFR calc non Af Amer: 6 mL/min — ABNORMAL LOW (ref >60)
Glucose, Bld: 48 mg/dL — ABNORMAL LOW (ref 65–99)
Potassium: 4.1 mmol/L (ref 3.5–5.1)
Sodium: 136 mmol/L (ref 135–145)
TOTAL PROTEIN: 7.1 g/dL (ref 6.5–8.1)
Total Bilirubin: 0.7 mg/dL (ref 0.3–1.2)

## 2015-05-16 LAB — GLUCOSE, CAPILLARY
Glucose-Capillary: 40 mg/dL — CL (ref 65–99)
Glucose-Capillary: 62 mg/dL — ABNORMAL LOW (ref 65–99)
Glucose-Capillary: 65 mg/dL (ref 65–99)
Glucose-Capillary: 56 mg/dL — ABNORMAL LOW (ref 65–99)
Glucose-Capillary: 129 mg/dL — ABNORMAL HIGH (ref 65–99)
Glucose-Capillary: 138 mg/dL — ABNORMAL HIGH (ref 65–99)
Glucose-Capillary: 134 mg/dL — ABNORMAL HIGH (ref 65–99)

## 2015-05-16 LAB — MAGNESIUM: Magnesium: 2.6 mg/dL — ABNORMAL HIGH (ref 1.7–2.4)

## 2015-05-16 SURGERY — AMPUTATION BELOW KNEE
Anesthesia: General | Site: Leg Lower | Laterality: Left

## 2015-05-16 MED ORDER — PROPOFOL 10 MG/ML IV BOLUS
INTRAVENOUS | Status: AC
  Filled 2015-05-16: qty 20

## 2015-05-16 MED ORDER — GUAIFENESIN-DM 100-10 MG/5ML PO SYRP: 15.0000 mL | ORAL_SOLUTION | ORAL | Status: DC | PRN

## 2015-05-16 MED ORDER — POTASSIUM CHLORIDE CRYS ER 20 MEQ PO TBCR
20.0000 meq | EXTENDED_RELEASE_TABLET | Freq: Every day | ORAL | Status: DC | PRN
Start: 2015-05-16 — End: 2015-05-24

## 2015-05-16 MED ORDER — MORPHINE SULFATE 2 MG/ML IJ SOLN
2.0000 mg | INTRAMUSCULAR | Status: DC | PRN
  Administered 2015-05-16: 4 mg via INTRAVENOUS
  Administered 2015-05-16: 2 mg via INTRAVENOUS
  Administered 2015-05-17: 4 mg via INTRAVENOUS
  Administered 2015-05-17: 2 mg via INTRAVENOUS
  Administered 2015-05-17: 4 mg via INTRAVENOUS
  Filled 2015-05-16: qty 1
  Filled 2015-05-16 (×4): qty 2

## 2015-05-16 MED ORDER — SUCCINYLCHOLINE CHLORIDE 20 MG/ML IJ SOLN
INTRAMUSCULAR | Status: DC | PRN
  Administered 2015-05-16: 120 mg via INTRAVENOUS

## 2015-05-16 MED ORDER — LABETALOL HCL 5 MG/ML IV SOLN
10.0000 mg | INTRAVENOUS | Status: DC | PRN
  Administered 2015-05-20 – 2015-05-22 (×2): 10 mg via INTRAVENOUS
  Filled 2015-05-16 (×3): qty 4

## 2015-05-16 MED ORDER — DEXTROSE 50 % IV SOLN
1.0000 | Freq: Once | INTRAVENOUS | Status: AC
  Filled 2015-05-16: qty 50

## 2015-05-16 MED ORDER — DEXTROSE 50 % IV SOLN
INTRAVENOUS | Status: AC
  Administered 2015-05-16: 1 via INTRAVENOUS
  Filled 2015-05-16: qty 50

## 2015-05-16 MED ORDER — FENTANYL CITRATE (PF) 250 MCG/5ML IJ SOLN
INTRAMUSCULAR | Status: AC
  Filled 2015-05-16: qty 5

## 2015-05-16 MED ORDER — LIDOCAINE HCL (CARDIAC) 20 MG/ML IV SOLN
INTRAVENOUS | Status: AC
  Filled 2015-05-16: qty 5

## 2015-05-16 MED ORDER — INSULIN GLARGINE 100 UNIT/ML ~~LOC~~ SOLN
10.0000 [IU] | Freq: Every day | SUBCUTANEOUS | Status: DC
  Administered 2015-05-16: 10 [IU] via SUBCUTANEOUS
  Filled 2015-05-16 (×9): qty 0.1

## 2015-05-16 MED ORDER — SODIUM CHLORIDE 0.9 % IV SOLN
10.0000 mg | INTRAVENOUS | Status: DC | PRN
  Administered 2015-05-16: 20 ug/min via INTRAVENOUS

## 2015-05-16 MED ORDER — ALUM & MAG HYDROXIDE-SIMETH 200-200-20 MG/5ML PO SUSP: 15.0000 mL | ORAL | Status: DC | PRN

## 2015-05-16 MED ORDER — FENTANYL CITRATE (PF) 100 MCG/2ML IJ SOLN: 25.0000 ug | INTRAMUSCULAR | Status: DC | PRN

## 2015-05-16 MED ORDER — ONDANSETRON HCL 4 MG/2ML IJ SOLN
INTRAMUSCULAR | Status: AC
  Filled 2015-05-16: qty 2

## 2015-05-16 MED ORDER — DARBEPOETIN ALFA 60 MCG/0.3ML IJ SOSY
PREFILLED_SYRINGE | INTRAMUSCULAR | Status: AC
  Administered 2015-05-16: 60 ug
  Filled 2015-05-16: qty 0.3

## 2015-05-16 MED ORDER — ONDANSETRON HCL 4 MG/2ML IJ SOLN: 4.0000 mg | Freq: Four times a day (QID) | INTRAMUSCULAR | Status: DC | PRN

## 2015-05-16 MED ORDER — ONDANSETRON HCL 4 MG/2ML IJ SOLN
INTRAMUSCULAR | Status: DC | PRN
  Administered 2015-05-16: 4 mg via INTRAVENOUS

## 2015-05-16 MED ORDER — 0.9 % SODIUM CHLORIDE (POUR BTL) OPTIME
TOPICAL | Status: DC | PRN
  Administered 2015-05-16: 1000 mL

## 2015-05-16 MED ORDER — DEXTROSE 50 % IV SOLN
25.0000 mL | Freq: Once | INTRAVENOUS | Status: AC
  Administered 2015-05-16: 25 mL via INTRAVENOUS

## 2015-05-16 MED ORDER — METOPROLOL TARTRATE 1 MG/ML IV SOLN
2.0000 mg | INTRAVENOUS | Status: DC | PRN
Start: 2015-05-16 — End: 2015-05-24

## 2015-05-16 MED ORDER — SODIUM CHLORIDE 0.9 % IV SOLN
INTRAVENOUS | Status: DC | PRN
  Administered 2015-05-16: 13:00:00 via INTRAVENOUS

## 2015-05-16 MED ORDER — LIDOCAINE HCL (CARDIAC) 20 MG/ML IV SOLN
INTRAVENOUS | Status: DC | PRN
  Administered 2015-05-16: 40 mg via INTRAVENOUS

## 2015-05-16 MED ORDER — MAGNESIUM SULFATE 2 GM/50ML IV SOLN: 2.0000 g | Freq: Every day | INTRAVENOUS | Status: DC | PRN

## 2015-05-16 MED ORDER — DEXTROSE 50 % IV SOLN
INTRAVENOUS | Status: AC
Start: 2015-05-16 — End: 2015-05-16
  Filled 2015-05-16: qty 50

## 2015-05-16 MED ORDER — OXYCODONE HCL 5 MG/5ML PO SOLN: 5.0000 mg | Freq: Once | ORAL | Status: DC | PRN

## 2015-05-16 MED ORDER — OXYCODONE HCL 5 MG PO TABS: 5.0000 mg | ORAL_TABLET | Freq: Once | ORAL | Status: DC | PRN

## 2015-05-16 MED ORDER — PROPOFOL 10 MG/ML IV BOLUS
INTRAVENOUS | Status: DC | PRN
  Administered 2015-05-16: 130 mg via INTRAVENOUS

## 2015-05-16 MED ORDER — HYDROCODONE-ACETAMINOPHEN 10-325 MG PO TABS
1.0000 | ORAL_TABLET | ORAL | Status: DC | PRN
  Administered 2015-05-16: 1 via ORAL
  Filled 2015-05-16: qty 1

## 2015-05-16 MED ORDER — PHENOL 1.4 % MT LIQD
1.0000 | OROMUCOSAL | Status: DC | PRN
Start: 2015-05-16 — End: 2015-05-24

## 2015-05-16 MED ORDER — FENTANYL CITRATE (PF) 100 MCG/2ML IJ SOLN
INTRAMUSCULAR | Status: DC | PRN
  Administered 2015-05-16 (×5): 50 ug via INTRAVENOUS

## 2015-05-16 MED ORDER — PANTOPRAZOLE SODIUM 40 MG PO TBEC
40.0000 mg | DELAYED_RELEASE_TABLET | Freq: Every day | ORAL | Status: DC
  Administered 2015-05-16 – 2015-05-24 (×9): 40 mg via ORAL
  Filled 2015-05-16 (×9): qty 1

## 2015-05-16 MED ORDER — ONDANSETRON HCL 4 MG/2ML IJ SOLN: 4.0000 mg | Freq: Once | INTRAMUSCULAR | Status: DC | PRN

## 2015-05-16 MED ORDER — PHENYLEPHRINE HCL 10 MG/ML IJ SOLN
INTRAMUSCULAR | Status: DC | PRN
  Administered 2015-05-16: 160 ug via INTRAVENOUS
  Administered 2015-05-16 (×4): 80 ug via INTRAVENOUS

## 2015-05-16 MED ORDER — CHLORHEXIDINE GLUCONATE CLOTH 2 % EX PADS: 6.0000 | MEDICATED_PAD | Freq: Once | CUTANEOUS | Status: AC

## 2015-05-16 SURGICAL SUPPLY — 55 items
BANDAGE ELASTIC 4 VELCRO ST LF (GAUZE/BANDAGES/DRESSINGS) ×2 IMPLANT
BANDAGE ELASTIC 6 VELCRO ST LF (GAUZE/BANDAGES/DRESSINGS) ×2 IMPLANT
BANDAGE ESMARK 6X9 LF (GAUZE/BANDAGES/DRESSINGS) IMPLANT
BLADE SAW SAG 73X25 THK (BLADE) ×1
BLADE SAW SGTL 73X25 THK (BLADE) ×1 IMPLANT
BNDG COHESIVE 6X5 TAN STRL LF (GAUZE/BANDAGES/DRESSINGS) ×2 IMPLANT
BNDG ESMARK 6X9 LF (GAUZE/BANDAGES/DRESSINGS)
BNDG GAUZE ELAST 4 BULKY (GAUZE/BANDAGES/DRESSINGS) ×2 IMPLANT
CANISTER SUCTION 2500CC (MISCELLANEOUS) ×2 IMPLANT
CLIP TI MEDIUM 6 (CLIP) IMPLANT
COVER BACK TABLE 60X90IN (DRAPES) ×2 IMPLANT
COVER SURGICAL LIGHT HANDLE (MISCELLANEOUS) ×2 IMPLANT
COVER TABLE BACK 60X90 (DRAPES) ×2 IMPLANT
CUFF TOURNIQUET SINGLE 24IN (TOURNIQUET CUFF) ×2 IMPLANT
CUFF TOURNIQUET SINGLE 34IN LL (TOURNIQUET CUFF) IMPLANT
CUFF TOURNIQUET SINGLE 44IN (TOURNIQUET CUFF) IMPLANT
DRAIN CHANNEL 19F RND (DRAIN) IMPLANT
DRAPE ORTHO SPLIT 77X108 STRL (DRAPES) ×2
DRAPE PROXIMA HALF (DRAPES) ×2 IMPLANT
DRAPE SURG ORHT 6 SPLT 77X108 (DRAPES) ×2 IMPLANT
DRSG ADAPTIC 3X8 NADH LF (GAUZE/BANDAGES/DRESSINGS) ×2 IMPLANT
ELECT REM PT RETURN 9FT ADLT (ELECTROSURGICAL) ×2
ELECTRODE REM PT RTRN 9FT ADLT (ELECTROSURGICAL) ×1 IMPLANT
EVACUATOR SILICONE 100CC (DRAIN) IMPLANT
GAUZE SPONGE 4X4 12PLY STRL (GAUZE/BANDAGES/DRESSINGS) ×4 IMPLANT
GLOVE BIO SURGEON STRL SZ 6.5 (GLOVE) ×2 IMPLANT
GLOVE BIO SURGEON STRL SZ7 (GLOVE) ×4 IMPLANT
GLOVE BIOGEL PI IND STRL 6.5 (GLOVE) ×5 IMPLANT
GLOVE BIOGEL PI IND STRL 7.5 (GLOVE) ×1 IMPLANT
GLOVE BIOGEL PI INDICATOR 6.5 (GLOVE) ×5
GLOVE BIOGEL PI INDICATOR 7.5 (GLOVE) ×1
GLOVE SS BIOGEL STRL SZ 6.5 (GLOVE) ×1 IMPLANT
GLOVE SUPERSENSE BIOGEL SZ 6.5 (GLOVE) ×1
GOWN STRL REUS W/ TWL LRG LVL3 (GOWN DISPOSABLE) ×3 IMPLANT
GOWN STRL REUS W/TWL LRG LVL3 (GOWN DISPOSABLE) ×3
KIT BASIN OR (CUSTOM PROCEDURE TRAY) ×2 IMPLANT
KIT ROOM TURNOVER OR (KITS) ×2 IMPLANT
NS IRRIG 1000ML POUR BTL (IV SOLUTION) ×2 IMPLANT
PACK GENERAL/GYN (CUSTOM PROCEDURE TRAY) ×2 IMPLANT
PAD ARMBOARD 7.5X6 YLW CONV (MISCELLANEOUS) ×2 IMPLANT
PADDING CAST COTTON 6X4 STRL (CAST SUPPLIES) IMPLANT
SPONGE GAUZE 4X4 12PLY STER LF (GAUZE/BANDAGES/DRESSINGS) ×2 IMPLANT
STAPLER VISISTAT 35W (STAPLE) ×2 IMPLANT
STOCKINETTE IMPERVIOUS LG (DRAPES) ×2 IMPLANT
SUT ETHILON 3 0 PS 1 (SUTURE) IMPLANT
SUT SILK 0 TIES 10X30 (SUTURE) IMPLANT
SUT SILK 2 0 (SUTURE) ×2
SUT SILK 2-0 18XBRD TIE 12 (SUTURE) ×2 IMPLANT
SUT SILK 3 0 (SUTURE) ×1
SUT SILK 3-0 18XBRD TIE 12 (SUTURE) ×1 IMPLANT
SUT VIC AB 2-0 CT1 18 (SUTURE) ×4 IMPLANT
SUT VIC AB 3-0 SH 18 (SUTURE) ×4 IMPLANT
TAPE UMBILICAL COTTON 1/8X30 (MISCELLANEOUS) ×2 IMPLANT
UNDERPAD 30X30 INCONTINENT (UNDERPADS AND DIAPERS) ×2 IMPLANT
WATER STERILE IRR 1000ML POUR (IV SOLUTION) ×2 IMPLANT

## 2015-05-16 NOTE — Clinical Documentation Improvement (Addendum)
MD's, NP's, and PA's   A cause and effect relationship may not be assumed and must be documented by a provider. Please clarify the relationship, if any, between  Gangrene and Diabetes Mellitus. Thank you   Are the conditions: ? Gangrene due to/manifestation of  Diabetes Mellitus ? Gangrene not due to / not a manifestation of Diabetes Mellitus ? Other (please specify)  ? Unrelated to each other ? Unable to determine ? Unknown    Risk Factors: cellulitis, DM 2, ESRD, HTN urgency, s/p rt bka  Sign & Symptoms: necrotic toes  Diagnostics: MRI of left foot Treatment: pending amputation  Thank you, Ree Kida ,RN Clinical Documentation Specialist:  [place] Information Management

## 2015-05-16 NOTE — Progress Notes (Signed)
OT Cancellation    05/16/15 1000  OT Visit Information  Last OT Received On 05/16/15  Reason Eval/Treat Not Completed Patient at procedure or test/ unavailable (At HD)  [facility], OTR/L  [phone] 05/16/2015

## 2015-05-16 NOTE — H&P (View-Only) (Signed)
[facility] NOTE   MRN : [id number]  Reason for Consult: left foot gangrene ischemic toes Referring Physician: Dr. Tana Coast  History of Present Illness: 64 y/o male with 9 week history of Left foot foot and malodorous black toes.  He is known to our office and is s/p right BKA by Dr. Bridgett Larsson for rest pain symptoms and ischemic toes on the right 01/04/2015.  Atherosclerotic risk factors include: DM, HLD managed with Zocor, ESRD on dialysis for 9 years, HTN managed with Lisinopril, and Smoking.        Current Facility-Administered Medications  Medication Dose Route Frequency Provider Last Rate Last Dose  . 0.9 %  sodium chloride infusion  250 mL Intravenous PRN Melton Alar, PA-C      . acetaminophen (TYLENOL) tablet 650 mg  650 mg Oral Q6H PRN Melton Alar, PA-C       Or  . acetaminophen (TYLENOL) suppository 650 mg  650 mg Rectal Q6H PRN Melton Alar, PA-C      . amphetamine-dextroamphetamine (ADDERALL) tablet 5 mg  5 mg Oral Daily Melton Alar, PA-C      . aspirin EC tablet 81 mg  81 mg Oral Daily Marianne L York, PA-C      . bisacodyl (DULCOLAX) EC tablet 10 mg  10 mg Oral Once Melton Alar, PA-C      . [START ON 05/16/2015] calcitRIOL (ROCALTROL) capsule 0.5 mcg  0.5 mcg Oral Q T,Th,Sa-HD Ernest Haber, PA-C      . Derrill Memo ON 05/16/2015] Darbepoetin Alfa (ARANESP) injection 60 mcg  60 mcg Intravenous Q Thu-HD Ernest Haber, PA-C      . docusate sodium (COLACE) capsule 100 mg  100 mg Oral BID Melton Alar, PA-C      . famotidine (PEPCID) tablet 20 mg  20 mg Oral Daily Melton Alar, PA-C      . [START ON 05/16/2015] ferric gluconate (NULECIT) 62.5 mg in sodium chloride 0.9 % 100 mL IVPB  62.5 mg Intravenous Q T,Th,Sa-HD Ernest Haber, PA-C      . heparin injection 5,000 Units  5,000 Units Subcutaneous 3 times per day Melton Alar, PA-C      . hydrALAZINE (APRESOLINE) injection 10 mg  10 mg Intravenous Q6H PRN Melton Alar, PA-C      .  HYDROcodone-acetaminophen (NORCO) 10-325 MG per tablet 1 tablet  1 tablet Oral Q6H PRN Melton Alar, PA-C      . insulin aspart (novoLOG) injection 0-9 Units  0-9 Units Subcutaneous TID WC Marianne L York, PA-C      . insulin glargine (LANTUS) Solostar Pen 25 Units  25 Units Subcutaneous QHS Melton Alar, PA-C      . lisinopril (PRINIVIL,ZESTRIL) tablet 20 mg  20 mg Oral Daily Melton Alar, PA-C      . metoCLOPramide (REGLAN) tablet 5 mg  5 mg Oral TID AC & HS Marianne L York, PA-C      . sevelamer carbonate (RENVELA) tablet 3,200 mg  3,200 mg Oral TID WC Marianne L York, PA-C      . simvastatin (ZOCOR) tablet 40 mg  40 mg Oral Daily Marianne L York, PA-C      . sodium chloride 0.9 % injection 3 mL  3 mL Intravenous Q12H Marianne L York, PA-C      . sodium chloride 0.9 % injection 3 mL  3 mL Intravenous Q12H Melton Alar, PA-C      .  sodium chloride 0.9 % injection 3 mL  3 mL Intravenous PRN Melton Alar, PA-C        Pt meds include: Statin :Yes Betablocker: No ASA: yes Other anticoagulants/antiplatelets: none  Past Medical History  Diagnosis Date  . Diabetes mellitus     Type 2. Diagnosed in mid 8s. Currently insulin requiring  . Hyperlipidemia   . ESRD (end stage renal disease) on dialysis     due to diabetic nephropathy. T/Th/Sat  . Tobacco abuse   . Diabetic neuropathy   . Diabetic retinopathy     legally blind  . Hypertension   . Dialysis patient   . Peripheral vascular disease   . Hx of right BKA     Past Surgical History  Procedure Laterality Date  . None    . Cardiac catheterization  05/06/2011    no significant CAD, EF 55-60%, LVEDP :15   . Av fistula placement    . Abdominal aortagram N/A 01/07/2015    Procedure: ABDOMINAL Maxcine Ham;  Surgeon: Conrad Hidalgo, MD;  Location: [facility] CATH LAB;  Service: Cardiovascular;  Laterality: N/A;  . Amputation Right 01/09/2015    Procedure: AMPUTATION BELOW KNEE RIGHT LEG;  Surgeon: Conrad [place], MD;  Location: [place];   Service: Vascular;  Laterality: Right;    Social History History  Substance Use Topics  . Smoking status: Current Every Day Smoker -- 1.00 packs/day for 30 years    Types: Cigarettes    Last Attempt to Quit: 01/09/2015  . Smokeless tobacco: Never Used  . Alcohol Use: No    Family History Family History  Problem Relation Age of Onset  . Heart disease    . Alcohol abuse    . Diabetes Mother   . Diabetes Sister   . Diabetes Brother     No Known Allergies   REVIEW OF SYSTEMS  General: [ ]  Weight loss, [ ]  Fever, [ ]  chills Neurologic: [ ]  Dizziness, [ ]  Blackouts, [ ]  Seizure [ ]  Stroke, [ ]  "Mini stroke", [ ]  Slurred speech, [ ]  Temporary blindness; [ ]  weakness in arms or legs, [ ]  Hoarseness [ ]  Dysphagia Cardiac: [ ]  Chest pain/pressure, [ ]  Shortness of breath at rest [ ]  Shortness of breath with exertion, [ ]  Atrial fibrillation or irregular heartbeat  Vascular: [ ]  Pain in legs with walking, [x ] Pain in legs at rest, [ ]  Pain in legs at night,  [x ] Non-healing ulcer, [ ]  Blood clot in vein/DVT,   Pulmonary: [ ]  Home oxygen, [ ]  Productive cough, [ ]  Coughing up blood, [ ]  Asthma,  [ ]  Wheezing [ ]  COPD Musculoskeletal:  [ ]  Arthritis, [ ]  Low back pain, [ ]  Joint pain Hematologic: [ ]  Easy Bruising, [ ]  Anemia; [ ]  Hepatitis Gastrointestinal: [ ]  Blood in stool, [ ]  Gastroesophageal Reflux/heartburn, Urinary: [ ]  chronic Kidney disease, [x ] on HD - [ ]  MWF or [x ] TTHS, [ ]  Burning with urination, [ ]  Difficulty urinating Skin: [ ]  Rashes, [x ] Wounds Psychological: [ ]  Anxiety, [ ]  Depression  Physical Examination Filed Vitals:   05/14/15 1430 05/14/15 1445 05/14/15 1500 05/14/15 1515  BP: 198/90 195/93 202/91 205/92  Pulse: 78 76 73 79  Temp:      TempSrc:      Resp: 15 16 13 17   Height:      SpO2: 96% 94% 94% 92%   There is no weight on file to calculate  BMI.  General:  Thin, malnourished, and  in NAD HENT: WNL Eyes: Pupils equal Pulmonary: normal  non-labored breathing , without Rales, rhonchi,  wheezing Cardiac: RRR, without  Murmurs, rubs or gallops; No carotid bruits Abdomen: soft, NT, no masses Skin: no rashes, ulcers noted;  positive Gangrene Base of first ray, 2-4 and medial base of fifth ray, no cellulitis; no open wounds;   Vascular Exam/Pulses:Palpable weak femoral pulses bil, left popliteal weak palpation, Doppler monophasic PT and AT, no signal at [facility]   Musculoskeletal: no muscle wasting or atrophy; no edema  Neurologic: A&O X 3; Appropriate Affect ;  SENSATION: normal; MOTOR FUNCTION: 4/5 Symmetric LE hips and knees, right BKA Speech is fluent/normal   Significant Diagnostic Studies: CBC Lab Results  Component Value Date   WBC 14.3* 05/14/2015   HGB 10.9* 05/14/2015   HCT 32.0* 05/14/2015   MCV 94.4 05/14/2015   PLT 160 05/14/2015    BMET    Component Value Date/Time   NA 133* 05/14/2015 1128   K 4.4 05/14/2015 1128   CL 96* 05/14/2015 1128   CO2 26 05/14/2015 1122   GLUCOSE 176* 05/14/2015 1128   BUN 58* 05/14/2015 1128   CREATININE 10.10* 05/14/2015 1128   CALCIUM 8.4* 05/14/2015 1122   GFRNONAA 5* 05/14/2015 1122   GFRAA 6* 05/14/2015 1122   CrCl cannot be calculated (Unknown ideal weight.).  COAG Lab Results  Component Value Date   INR 1.08 01/07/2015     Non-Invasive Vascular Imaging:  01/07/2015 angiogram  Left Heavily calcified stenosis, occluded popliteal, Reconstitutes from collaterals, Originates from reconstituted tibioperoneal trunk, miniscule distally, Originates from reconstituted tibioperoneal trunk, dominant runoff.  ASSESSMENT/PLAN:  Left toes gangrene with history of rest pain and right BKA He may be a candidate for revascularization based on his angiogram from 01/07/2015.  Or he may need to consider transmetatarsal amputation.  This will be discussed with Dr. Fae Pippin, EMMA [facility] 05/14/2015 4:02 PM  I agree with the above.   This is a  64 y/o male with 9  week history of  left foot pain and malodorous black toes.  He is known to our office and is s/p right BKA by Dr. Bridgett Larsson for rest pain symptoms and ischemic toes on the right 01/04/2015.  The patient suffers from diabetes which is failry well controlled with a HbA1c in 12/2014 of 6.0.  His hypercholesterolemia is managed with a statin.  He has ESRD secondary to diabetic nephropathy, and has been on dialysis for 9 years on T/TH/Sat.  His hypertension is managed with a ACE-I.  The patient continues to smoke.  On examination he has ischemic changes with drainage to the toes on the left foot.  I have reviewed his angiogram from 01/07/2015 which shows an occluded left superficial femoral and popliteal artery.  There is reconstitution of calcified diseased tibial vessels.  I discussed with the patient he is at high risk for amputation of his left leg.  He may potentially be a candidate for revascularization, possibly percutaneous or surgical in an attempt to keep his amputation limited to a transmetatarsal amputation.  I will discuss with Dr. Bridgett Larsson whether or not to proceed with angiography either tomorrow or Thursday.  I will keep him NPO tonight in case angiography can be done tomorrow. He has been seen by cardiology (Dr. Martinique).  His elevated troponin is felt to be strain from severe HTN and LVH.   He felt that it is doubtful that  this represents an ACS.  Risk stratification would be requested if surgical revascularization is required.  Continue IV Abx.  Annamarie Major

## 2015-05-16 NOTE — Progress Notes (Signed)
[facility] PROGRESS NOTE  Garrett Salas [id number] DOB: 1951-10-06 DOA: 05/14/2015 PCP: [name], MD  Assessment/Plan: #1 wet gangrene of the left foot Patient has been seen in consultation by vascular surgery patient noted to have some tenderness to palpation consistent with ascending cellulitis of his foot. It is felt per vascular surgery the patient's left foot is not salvageable. Patient has been offered left BKA versus AKA. Patient s/p left BKA.  Cultures pending. Continue empiric Antibiotics. Vascular surgery following.  #2 acute encephalopathy Likely secondary to problem #1. Patient alert oriented 4. No other source of infection. Patient blood cultures are pending. Continue empiric IV antibiotics.  #3 elevated troponin and EKG changes Patient has been seen in consultation by cardiology who patient likely had a demand ischemia secondary to his acute infection. Wet gangrene of the left foot with cellulitis.patient denies any chest pain. 2-D echo which was done early on showed a normal LV function. His felt that elevated troponin I EKG changes likely secondary to acute infection and severe hypertension. Cardiology feels no further cardiac workup is needed and to manage blood pressure. Appreciate cardiology and per recommendations.  #4 end-stage renal disease on hemodialysis Per nephrology.  #5 hypertensive urgency  Improvement with blood pressure post hemodialysis. Continue lisinopril 20 mg daily.  #6 diabetes mellitus with neuropathy HgbA1C = 6.2. CBGs 56-138. Decrease Lantus to 10 units daily. Continue sliding scale insulin. Continue Reglan.  #7 generalized weakness Likely secondary to problem #1. PT/OT.  #8 prophylaxis Heparin for DVT prophylaxis.   Code Status: full Family Communication: updated patient. No family at bedside. Disposition Plan: home versus SNF pending PT eval.   Consultants:  Vascular surgery: Dr. Trula Slade 05/14/2015  Nephrology:  Dr. Jonnie Finner 05/14/2015  Cardiology: Dr. Martinique 05/14/2015  Procedures:  MRI left foot 05/14/2015  Chest x-ray 05/14/2015  Left BKA 05/16/15  Antibiotics:  IV Zosyn 05/14/2015  IV vancomycin 05/14/2015  HPI/Subjective: Patient denies any chest pain. Patient s/p surgery.  Objective: Filed Vitals:   05/16/15 1556  BP: 141/59  Pulse: 88  Temp:   Resp:     Intake/Output Summary (Last 24 hours) at 05/16/15 1650 Last data filed at 05/16/15 1530  Gross per 24 hour  Intake    670 ml  Output   2126 ml  Net  -1456 ml   Filed Weights   05/16/15 0459 05/16/15 0748 05/16/15 1140  Weight: 51.3 kg (113 lb 1.5 oz) 51 kg (112 lb 7 oz) 48 kg (105 lb 13.1 oz)    Exam:   General:  NAD  Cardiovascular: RRR  Respiratory: CTAB  Abdomen: Soft, nontender, nondistended, positive bowel sounds.  Musculoskeletal: No clubbing or cyanosis. S/p left BKA  Data Reviewed: Basic Metabolic Panel:  Recent Labs Lab 05/14/15 1122 05/14/15 1128 05/15/15 0340 05/16/15 0449  NA 138 133* 140 136  K 4.5 4.4 3.9 4.1  CL 92* 96* 93* 94*  CO2 26  --  31 27  GLUCOSE 176* 176* 127* 48*  BUN 64* 58* 39* 63*  CREATININE 10.13* 10.10* 7.06* 8.77*  CALCIUM 8.4*  --  8.4* 8.1*  MG  --   --   --  2.6*   Liver Function Tests:  Recent Labs Lab 05/16/15 0449  AST 29  ALT 11*  ALKPHOS 69  BILITOT 0.7  PROT 7.1  ALBUMIN 2.4*   No results for input(s): LIPASE, AMYLASE in the last 168 hours. No results for input(s): AMMONIA in the last 168 hours. CBC:  Recent Labs Lab  05/14/15 1122 05/14/15 1128 05/15/15 0340 05/16/15 0449  WBC 14.3*  --  15.9* 16.3*  NEUTROABS 12.4*  --   --  13.8*  HGB 10.0* 10.9* 10.9* 12.0*  HCT 30.2* 32.0* 33.2* 35.9*  MCV 94.4  --  94.1 93.7  PLT 160  --  182 197   Cardiac Enzymes:  Recent Labs Lab 05/14/15 1122 05/14/15 1558 05/14/15 2210 05/15/15 0340  TROPONINI 1.17* 1.05* 0.86* 0.72*   BNP (last 3 results) No results for input(s): BNP in the  last 8760 hours.  ProBNP (last 3 results) No results for input(s): PROBNP in the last 8760 hours.  CBG:  Recent Labs Lab 05/16/15 0837 05/16/15 1032 05/16/15 1229 05/16/15 1458 05/16/15 1623  GLUCAP 62* 65 56* 129* 138*    Recent Results (from the past 240 hour(s))  Culture, blood (routine x 2)     Status: None (Preliminary result)   Collection Time: 05/14/15 11:22 AM  Result Value Ref Range Status   Specimen Description BLOOD LEFT ANTECUBITAL  Final   Special Requests BOTTLES DRAWN AEROBIC ONLY 3CC  Final   Culture NO GROWTH 2 DAYS  Final   Report Status PENDING  Incomplete  Culture, blood (routine x 2)     Status: None (Preliminary result)   Collection Time: 05/14/15  3:36 PM  Result Value Ref Range Status   Specimen Description BLOOD LEFT ARM  Final   Special Requests BOTTLES DRAWN AEROBIC AND ANAEROBIC 5CC  Final   Culture NO GROWTH 2 DAYS  Final   Report Status PENDING  Incomplete  Surgical pcr screen     Status: None   Collection Time: 05/15/15  8:06 PM  Result Value Ref Range Status   MRSA, PCR NEGATIVE NEGATIVE Final   Staphylococcus aureus NEGATIVE NEGATIVE Final    Comment:        The Xpert SA Assay (FDA approved for NASAL specimens in patients over 48 years of age), is one component of a comprehensive surveillance program.  Test performance has been validated by [facility] for patients greater than or equal to 24 year old. It is not intended to diagnose infection nor to guide or monitor treatment.      Studies: Mr Foot Left Wo Contrast  05/15/2015   CLINICAL DATA:  Diabetic patient with gangrene of the left fluid left foot pain subjective fever. The patient's left second, third and fourth toes and base of the fifth toe are black and gangrenous.  EXAM: MRI OF THE LEFT FOREFOOT WITHOUT CONTRAST  TECHNIQUE: Multiplanar, multisequence MR imaging was performed. No intravenous contrast was administered.  COMPARISON:  Plain films of the right foot  01/02/2015.  FINDINGS: The study is degraded by patient motion. Subcutaneous edema is seen over the dorsum of the foot without focal fluid collection. No bone marrow signal abnormality to suggest osteomyelitis is identified. No evidence of soft tissue gas is seen. There is no fracture or other focal bony abnormality. No notable arthropathy is seen.  IMPRESSION: Mild appearing subcutaneous edema over the dorsum of the foot. Negative for abscess or osteomyelitis.   Electronically Signed   By: Inge Rise M.D.   On: 05/15/2015 08:26    Scheduled Meds: . amphetamine-dextroamphetamine  5 mg Oral Q breakfast  . aspirin EC  81 mg Oral Daily  . calcitRIOL  0.5 mcg Oral Q T,Th,Sa-HD  . darbepoetin (ARANESP) injection - DIALYSIS  60 mcg Intravenous Q Thu-HD  . docusate sodium  100 mg Oral BID  . famotidine  20 mg Oral Daily  . feeding supplement (ENSURE ENLIVE)  237 mL Oral BID BM  . ferric gluconate (FERRLECIT/NULECIT) IV  62.5 mg Intravenous Q T,Th,Sa-HD  . heparin  5,000 Units Subcutaneous 3 times per day  . insulin aspart  0-9 Units Subcutaneous TID WC  . insulin glargine  10 Units Subcutaneous QHS  . lisinopril  20 mg Oral Daily  . metoCLOPramide  5 mg Oral TID AC & HS  . nicotine  21 mg Transdermal Q24H  . pantoprazole  40 mg Oral Daily  . piperacillin-tazobactam (ZOSYN)  IV  2.25 g Intravenous Q8H  . sevelamer carbonate  3,200 mg Oral TID WC  . simvastatin  40 mg Oral q1800  . sodium chloride  3 mL Intravenous Q12H  . vancomycin  500 mg Intravenous Q T,Th,Sa-HD   Continuous Infusions:   Principal Problem:   Gangrene of foot Active Problems:   Hypertension   ESRD on dialysis   HLD (hyperlipidemia)   DM (diabetes mellitus) type II controlled peripheral vascular disorder   PAD (peripheral artery disease)   Generalized weakness   Hypertensive heart disease   Gangrene of lower extremity   ESRD on hemodialysis   Hypertensive urgency   Acute encephalopathy   Elevated  troponin    Time spent: 52 minutes    THOMPSON,DANIEL M.D. [facility] Pager [phone]. If 7PM-7AM, please contact night-coverage at [email or web address], password [facility] 05/16/2015, 4:50 PM  LOS: 2 days

## 2015-05-16 NOTE — Anesthesia Preprocedure Evaluation (Addendum)
Anesthesia Evaluation  Patient identified by MRN, date of birth, ID band Patient awake    Reviewed: Allergy & Precautions, NPO status , Patient's Chart, lab work & pertinent test results  Airway Mallampati: II  TM Distance: >3 FB Neck ROM: Full    Dental  (+) Edentulous Upper   Pulmonary Current Smoker,  breath sounds clear to auscultation        Cardiovascular hypertension, Rhythm:Regular Rate:Normal     Neuro/Psych    GI/Hepatic   Endo/Other  diabetes  Renal/GU      Musculoskeletal   Abdominal   Peds  Hematology   Anesthesia Other Findings   Reproductive/Obstetrics                           Anesthesia Physical Anesthesia Plan  ASA: III  Anesthesia Plan: General   Post-op Pain Management:    Induction: Intravenous  Airway Management Planned: LMA  Additional Equipment:   Intra-op Plan:   Post-operative Plan:   Informed Consent: I have reviewed the patients History and Physical, chart, labs and discussed the procedure including the risks, benefits and alternatives for the proposed anesthesia with the patient or authorized representative who has indicated his/her understanding and acceptance.   Dental advisory given  Plan Discussed with: CRNA and Anesthesiologist  Anesthesia Plan Comments: (PVD gangrene L. Foot S/P R. BKA ESRD had HD today K-4.1 pre-dialysis Type 2 DM glucose 56 will give 1 amp D50 Hypertension COPD Smoker  Admitted with chest pain seen by Cardiology, minimal increase in troponins, previous mild coronary irregularities on cath 2012  Plan GA with oral ETT   Roberts Gaudy )       Anesthesia Quick Evaluation

## 2015-05-16 NOTE — Anesthesia Postprocedure Evaluation (Signed)
  Anesthesia Post-op Note  Patient: [facility]  Procedure(s) Performed: Procedure(s): LEFT BELOW THE KNEE AMPUTATION (Left)  Patient Location: PACU  Anesthesia Type:General  Level of Consciousness: awake, alert  and oriented  Airway and Oxygen Therapy: Patient Spontanous Breathing and Patient connected to nasal cannula oxygen  Post-op Pain: mild  Post-op Assessment: Post-op Vital signs reviewed, Patient's Cardiovascular Status Stable, Respiratory Function Stable, Patent Airway and Pain level controlled              Post-op Vital Signs: stable  Last Vitals:  Filed Vitals:   05/16/15 1556  BP: 141/59  Pulse: 88  Temp:   Resp:     Complications: No apparent anesthesia complications

## 2015-05-16 NOTE — Op Note (Addendum)
OPERATIVE NOTE   PROCEDURE: left below-the-knee amputation  PRE-OPERATIVE DIAGNOSIS: left foot gangrene  POST-OPERATIVE DIAGNOSIS: same as above  SURGEON: Adele Barthel, MD  ASSISTANT(S): Silva Bandy, PAC   ANESTHESIA: general  ESTIMATED BLOOD LOSS: 50 cc  FINDING(S): 1.  Viable muscle with adequate bleeding in calf 2.  Severely calcific popliteal artery and anterior tibial artery: required multiple ties to control bleeding  SPECIMEN(S):  left below-the-knee amputation  INDICATIONS:   Garrett Salas is a 64 y.o. male who presents with left foot wet gangrene.  The patient is scheduled for a left below-the-knee amputation.  I discussed in depth with the patient the risks, benefits, and alternatives to this procedure.  The patient is aware that the risk of this operation included but are not limited to:  bleeding, infection, myocardial infarction, stroke, death, failure to heal amputation wound, and possible need for more proximal amputation.  The patient is aware of the risks and agrees proceed forward with the procedure.  DESCRIPTION:  After full informed written consent was obtained from the patient, the patient was brought back to the operating room, and placed supine upon the operating table.  Prior to induction, the patient received IV antibiotics.  The patient was then prepped and draped in the standard fashion for a below-the-knee amputation.  I placed a non-sterile tourniquet on the thigh prior to the procedure.  After obtaining adequate anesthesia, the patient was prepped and draped in the standard fashion for a below-the-knee amputation.  I marked out the anterior incision 10 cm distal to the tibial tuberosity and then the marked out a posterior flap that was one third of the circumference of the calf in length.  I then exsanguinated the leg with a Esmarch bandage and then inflated the tourniquet to 250 mm Hg.   I made the incisions for these flaps, and then dissected through the  subcutaneous tissue, fascia, and muscle anteriorly.  I elevated  the periosteal tissue superiorly so that the tibia was about 3 cm shorter than the anterior skin flap.  I then transected the tibia with a power saw and then took a wedge off the tibia anteriorly with the power saw.  Then I smoothed out the rough edges with a rasp.  In a similar fashion, I cut back the fibula about two centimeters higher than the level of the tibia with a bone cutter.  I used electrocautery to sharply develop a tissue plane through the muscle along the fibula.  In such fashion, the posterior flap was developed.  At this point, the specimen was passed off the field as the below-the-knee amputation.  At this point, I clamped all visibly bleeding arteries and veins using a combination of suture ligation with Vicryl suture and electrocautery.  The tourniquet was then deflated at this point.  Bleeding continued to be controlled with electrocautery and suture ligature.  The stump was washed off with sterile normal saline and no further active bleeding was noted.  I reapproximated the anterior and posterior fascia  with interrupted stitches of 2-0 Vicryl.  This was completed along the entire length of anterior and posterior fascia until there were no more loose space in the fascial line.  The skin was then  reapproximated with staples.  The stump was washed off and dried.  The incision was dressed with Adpatec and  then fluffs were applied.  Kerlix was wrapped around the leg and then gently an ACE wrap was applied.     COMPLICATIONS: none  CONDITION: stable   Adele Barthel, MD [facility] [place] Office: [phone] Pager: [phone]  05/16/2015, 2:29 PM

## 2015-05-16 NOTE — Progress Notes (Signed)
Pt with morning CBG of 40. Amp of D50 obtained from pyxis. Pt's peripheral IV painful. HD nurses state they will give him the D50 in HD. Patient awake and following commands. Dr. Grandville Silos notified.  Roselyn Reef Marylon Verno,RN

## 2015-05-16 NOTE — Interval H&P Note (Signed)
[facility] [place]  History and Physical Update  The patient was interviewed and re-examined.  The patient's previous History and Physical has been reviewed and is unchanged from Dr. Stephens Shire consult except for progression of left foot wet gangrene.  I discussed with the patient that in my opinion his L foot was not salvage.  We discussed L BKA vs AKA.  I discussed in depth the nature of below-the-knee vs above-the-kneeamputation with the patient, including risks, benefits, and alternatives.  The patient is aware that the risks of below-the-knee vs above-the-knee amputation include but are not limited to: bleeding, infection, myocardial infarction, stroke, death, failure to heal amputation wound, and possible need for more proximal amputation.  The patient is aware of the risks and agrees proceed forward with the procedure.  Adele Barthel, MD [facility] [place] Office: [phone] Pager: [phone]  05/16/2015, 12:32 PM

## 2015-05-16 NOTE — Progress Notes (Signed)
Subjective:     Objective Vital signs in last 24 hours: Filed Vitals:   05/16/15 0930 05/16/15 1000 05/16/15 1030 05/16/15 1100  BP: 127/73 126/73 136/79 142/106  Pulse: 87 86 90 90  Temp:      TempSrc:      Resp:      Height:      Weight:      SpO2:       Weight change: -1.816 kg (-4 lb 0.1 oz)  Physical Exam: General:Alert, thin , chronically  Ill BM , NAD Appears Baseline MS OX3 Heart: RRR , no mur , rub, or gallop Lungs: CTA bilat , non labored breathing  Abdomen: soft NT, ND  Extremities: no pedal edema Left Or Edema of stump R BKA/ L foot not unwrapped  Dialysis Access: pos. Bruit R UA AVF  TTS   3.5h   52kg    Bath 2/2.25   Heparin none   RUA AVF Calcitriol 0.5 ug, Mircera 50 q 4wks next 6/28 Venofer 100 x 5 thru 7/7 Other Op labs hgb 11.2 05/09/15 tfs 10%   Assessment: 1. L Lower foot Gangreene/ PVD ( ho R BKA) = on vanc/ zosyn, possible surgery soon 2. Positive troponin and Abnormal Ekg changes - Per admit / Card eval In progress/ CE trending down  3. AMS - resolved 4. ESRD HD TTS 5. HTN/ vol - just under dry wt, no vol excess on exam 6. Anemia - hgb 10.9 on low dose esa and fe load 7. Metabolic bone disease - cont vit D/ binders 8. IIDDM- per admit 9. Nutrition - renal ,carb mod Renal vitamin  Plan - still trying to reach family but unsuccessfully. I tried a number he gave me for his brother but no answer. HD today.   Kelly Splinter MD pager [phone]    cell [phone] 05/16/2015, 11:37 AM     Labs: Basic Metabolic Panel:  Recent Labs Lab 05/14/15 1122 05/14/15 1128 05/15/15 0340 05/16/15 0449  NA 138 133* 140 136  K 4.5 4.4 3.9 4.1  CL 92* 96* 93* 94*  CO2 26  --  31 27  GLUCOSE 176* 176* 127* 48*  BUN 64* 58* 39* 63*  CREATININE 10.13* 10.10* 7.06* 8.77*  CALCIUM 8.4*  --  8.4* 8.1*  CBC:  Recent Labs Lab 05/14/15 1122 05/14/15 1128 05/15/15 0340 05/16/15 0449  WBC 14.3*  --  15.9* 16.3*  NEUTROABS 12.4*  --    --  13.8*  HGB 10.0* 10.9* 10.9* 12.0*  HCT 30.2* 32.0* 33.2* 35.9*  MCV 94.4  --  94.1 93.7  PLT 160  --  182 197   Cardiac Enzymes:  Recent Labs Lab 05/14/15 1122 05/14/15 1558 05/14/15 2210 05/15/15 0340  TROPONINI 1.17* 1.05* 0.86* 0.72*   CBG:  Recent Labs Lab 05/15/15 1641 05/15/15 2043 05/16/15 0736 05/16/15 0837 05/16/15 1032  GLUCAP 197* 146* 40* 62* 65     Medications: . sodium chloride     . amphetamine-dextroamphetamine  5 mg Oral Q breakfast  . aspirin EC  81 mg Oral Daily  . calcitRIOL  0.5 mcg Oral Q T,Th,Sa-HD  . cefUROXime (ZINACEF)  IV  1.5 g Intravenous To OR  . darbepoetin (ARANESP) injection - DIALYSIS  60 mcg Intravenous Q Thu-HD  . dextrose  25 mL Intravenous Once  . dextrose  1 ampule Intravenous Once  . docusate sodium  100 mg Oral BID  . famotidine  20 mg Oral Daily  . feeding supplement (ENSURE  ENLIVE)  237 mL Oral BID BM  . ferric gluconate (FERRLECIT/NULECIT) IV  62.5 mg Intravenous Q T,Th,Sa-HD  . heparin  5,000 Units Subcutaneous 3 times per day  . insulin aspart  0-9 Units Subcutaneous TID WC  . insulin glargine  25 Units Subcutaneous QHS  . lisinopril  20 mg Oral Daily  . metoCLOPramide  5 mg Oral TID AC & HS  . nicotine  21 mg Transdermal Q24H  . piperacillin-tazobactam (ZOSYN)  IV  2.25 g Intravenous Q8H  . sevelamer carbonate  3,200 mg Oral TID WC  . simvastatin  40 mg Oral q1800  . sodium chloride  3 mL Intravenous Q12H  . vancomycin  500 mg Intravenous Q T,Th,Sa-HD

## 2015-05-16 NOTE — Progress Notes (Signed)
Inpatient Diabetes Program Recommendations  AACE/ADA: New Consensus Statement on Inpatient Glycemic Control (2013)  Target Ranges:  Prepandial:   less than 140 mg/dL      Peak postprandial:   less than 180 mg/dL (1-2 hours)      Critically ill patients:  140 - 180 mg/dL  Results for ROWAN, BOUWKAMP (MRN [id number]) as of 05/16/2015 12:19  Ref. Range 05/15/2015 20:43 05/16/2015 07:36 05/16/2015 08:37 05/16/2015 10:32  Glucose-Capillary Latest Ref Range: 65-99 mg/dL 146 (H) 40 (LL) 62 (L) 65   Inpatient Diabetes Program Recommendations Insulin - Basal: reduce Lantus to 10 units  Thank you  Raoul Pitch BSN, RN,CDE Inpatient Diabetes Coordinator [phone] (team pager)

## 2015-05-16 NOTE — Anesthesia Procedure Notes (Signed)
Procedure Name: Intubation Date/Time: 05/16/2015 1:08 PM Performed by: Ollen Bowl Pre-anesthesia Checklist: Patient identified, Emergency Drugs available, Suction available, Patient being monitored and Timeout performed Patient Re-evaluated:Patient Re-evaluated prior to inductionOxygen Delivery Method: Circle system utilized and Simple face mask Preoxygenation: Pre-oxygenation with 100% oxygen Intubation Type: IV induction Ventilation: Mask ventilation without difficulty Laryngoscope Size: Miller and 3 Grade View: Grade I Tube type: Oral Tube size: 7.5 mm Number of attempts: 1 Airway Equipment and Method: Patient positioned with wedge pillow and Stylet Placement Confirmation: ETT inserted through vocal cords under direct vision,  positive ETCO2 and breath sounds checked- equal and bilateral Secured at: 22 cm Tube secured with: Tape Dental Injury: Teeth and Oropharynx as per pre-operative assessment

## 2015-05-16 NOTE — Transfer of Care (Signed)
Immediate Anesthesia Transfer of Care Note  Patient: [facility]  Procedure(s) Performed: Procedure(s): LEFT BELOW THE KNEE AMPUTATION (Left)  Patient Location: PACU  Anesthesia Type:General  Level of Consciousness: awake and patient cooperative  Airway & Oxygen Therapy: Patient Spontanous Breathing and Patient connected to nasal cannula oxygen  Post-op Assessment: Report given to RN, Post -op Vital signs reviewed and stable and Patient moving all extremities  Post vital signs: Reviewed and stable  Last Vitals:  Filed Vitals:   05/16/15 1500  BP: 116/50  Pulse: 95  Temp:   Resp: 13    Complications: No apparent anesthesia complications

## 2015-05-17 ENCOUNTER — Encounter (HOSPITAL_COMMUNITY): Payer: Self-pay | Admitting: Vascular Surgery

## 2015-05-17 DIAGNOSIS — Z89511 Acquired absence of right leg below knee: Secondary | ICD-10-CM

## 2015-05-17 DIAGNOSIS — E43 Unspecified severe protein-calorie malnutrition: Secondary | ICD-10-CM | POA: Diagnosis present

## 2015-05-17 DIAGNOSIS — I70229 Atherosclerosis of native arteries of extremities with rest pain, unspecified extremity: Secondary | ICD-10-CM | POA: Diagnosis present

## 2015-05-17 DIAGNOSIS — I998 Other disorder of circulatory system: Secondary | ICD-10-CM | POA: Diagnosis present

## 2015-05-17 DIAGNOSIS — Z89512 Acquired absence of left leg below knee: Secondary | ICD-10-CM

## 2015-05-17 LAB — BASIC METABOLIC PANEL
Anion gap: 16 — ABNORMAL HIGH (ref 5–15)
BUN: 25 mg/dL — ABNORMAL HIGH (ref 6–20)
CO2: 28 mmol/L (ref 22–32)
Calcium: 8 mg/dL — ABNORMAL LOW (ref 8.9–10.3)
Chloride: 95 mmol/L — ABNORMAL LOW (ref 101–111)
Creatinine, Ser: 5.17 mg/dL — ABNORMAL HIGH (ref 0.61–1.24)
GFR calc non Af Amer: 11 mL/min — ABNORMAL LOW (ref >60)
GFR, EST AFRICAN AMERICAN: 12 mL/min — AB (ref >60)
GLUCOSE: 85 mg/dL (ref 65–99)
POTASSIUM: 3.8 mmol/L (ref 3.5–5.1)
Sodium: 139 mmol/L (ref 135–145)

## 2015-05-17 LAB — FERRITIN: FERRITIN: 1277 ng/mL — AB (ref 24–336)

## 2015-05-17 LAB — CBC
HEMATOCRIT: 25.6 % — AB (ref 39.0–52.0)
HEMOGLOBIN: 8.4 g/dL — AB (ref 13.0–17.0)
MCH: 31.8 pg (ref 26.0–34.0)
MCHC: 32.8 g/dL (ref 30.0–36.0)
MCV: 97 fL (ref 78.0–100.0)
Platelets: 254 10*3/uL (ref 150–400)
RBC: 2.64 MIL/uL — ABNORMAL LOW (ref 4.22–5.81)
RDW: 15.3 % (ref 11.5–15.5)
WBC: 15.1 10*3/uL — AB (ref 4.0–10.5)

## 2015-05-17 LAB — IRON AND TIBC
Iron: 64 ug/dL (ref 45–182)
SATURATION RATIOS: 29 % (ref 17.9–39.5)
TIBC: 220 ug/dL — ABNORMAL LOW (ref 250–450)
UIBC: 156 ug/dL

## 2015-05-17 LAB — FOLATE: Folate: 20.4 ng/mL (ref >5.9)

## 2015-05-17 LAB — GLUCOSE, CAPILLARY
GLUCOSE-CAPILLARY: 118 mg/dL — AB (ref 65–99)
Glucose-Capillary: 71 mg/dL (ref 65–99)
Glucose-Capillary: 106 mg/dL — ABNORMAL HIGH (ref 65–99)
Glucose-Capillary: 144 mg/dL — ABNORMAL HIGH (ref 65–99)

## 2015-05-17 LAB — VITAMIN B12: VITAMIN B 12: 457 pg/mL (ref 180–914)

## 2015-05-17 MED ORDER — ACETAMINOPHEN-CODEINE #3 300-30 MG PO TABS
1.0000 | ORAL_TABLET | ORAL | Status: DC | PRN
  Administered 2015-05-18 – 2015-05-20 (×3): 1 via ORAL
  Administered 2015-05-20: 2 via ORAL
  Filled 2015-05-17 (×2): qty 1
  Filled 2015-05-17 (×2): qty 2

## 2015-05-17 MED ORDER — OXYCODONE HCL 5 MG PO TABS
5.0000 mg | ORAL_TABLET | ORAL | Status: DC | PRN
  Administered 2015-05-17: 5 mg via ORAL
  Filled 2015-05-17: qty 1

## 2015-05-17 NOTE — Progress Notes (Signed)
Pt removed dressing to left stump. No active bleeding noted. Dr. Bridgett Larsson notified, said to replace gauze around stump but leave off the ace wrap. Charge RN replaced gauze, pt was educated about the importance of keeping the dressing in place. Will continue to monitor.

## 2015-05-17 NOTE — Evaluation (Signed)
Occupational Therapy Evaluation Patient Details Name: Garrett Salas MRN: [id number] DOB: 09-25-1951 Today's Date: 05/17/2015    History of Present Illness Garrett Salas is a 64 y.o. male with history of ESRD on hemodialysis TTS, peripheral vascular disease, right BKA, diabetes who presents with generalized weakness, subjective fevers, left foot pain and gangrene. The patient's family and home health nurse reported that he was not active normally with decreased oral intake for the past several days with progressive weakness. In ED, found to have elevated troponin 1.17 with EKG changes. Denies any chest pain. R BKA 05/16/15.   Clinical Impression   Pt presents with lethargy and impaired cognition, medication likely contributing.  Pt with increased R LE pain.  He requires +2 assist for bed mobility and mod assist to sit EOB.  He is currently dependent in all ADL.  Will follow acutely.  Pt will need post acute rehab.    Follow Up Recommendations  CIR;Supervision/Assistance - 24 hour    Equipment Recommendations  Other (comment) (defer to next venue)    Recommendations for Other Services       Precautions / Restrictions Precautions Precautions: Fall Precaution Comments: B BKA Restrictions Weight Bearing Restrictions: No      Mobility Bed Mobility Overal bed mobility: Needs Assistance;+2 for physical assistance Bed Mobility: Supine to Sit;Sit to Supine     Supine to sit: +2 for physical assistance;Max assist Sit to supine: +2 for physical assistance;Max assist   General bed mobility comments: assisted to raise trunk and advance hips to EOB  Transfers                      Balance Overall balance assessment: Needs assistance   Sitting balance-Leahy Scale: Poor Sitting balance - Comments: mod assist, sat x 6 minutes, c/o dizziness                                    ADL Overall ADL's : Needs assistance/impaired                                        General ADL Comments: Pt requiring max assist for all ADL due to level of arousal.     Vision     Perception     Praxis      Pertinent Vitals/Pain Pain Assessment: Faces Faces Pain Scale: Hurts whole lot Pain Location: R LE Pain Descriptors / Indicators: Grimacing;Guarding;Operative site guarding Pain Intervention(s): Limited activity within patient's tolerance;Monitored during session;Premedicated before session;Repositioned     Hand Dominance     Extremity/Trunk Assessment Upper Extremity Assessment Upper Extremity Assessment: Overall WFL for tasks assessed;Difficult to assess due to impaired cognition   Lower Extremity Assessment Lower Extremity Assessment: Defer to PT evaluation       Communication Communication Communication: No difficulties   Cognition Arousal/Alertness: Lethargic;Suspect due to medications Behavior During Therapy: Flat affect Overall Cognitive Status: Impaired/Different from baseline Area of Impairment: Orientation;Attention;Memory;Following commands;Problem solving Orientation Level: Disoriented to;Place;Time;Situation Current Attention Level: Focused   Following Commands:  (requires multimodal cues, not following commands)     Problem Solving: Slow processing;Difficulty sequencing;Requires verbal cues;Requires tactile cues General Comments: Pt with lethargy. Perseverating about his brother, Liliane Channel, who called early in session   General Comments       Exercises       Shoulder  Instructions      Home Living Family/patient expects to be discharged to:: Private residence Living Arrangements: Spouse/significant other;Children Available Help at Discharge: Family;Available PRN/intermittently;Personal care attendant Type of Home: Mobile home Home Access: Ramped entrance     Home Layout: One level     Bathroom Shower/Tub: Occupational psychologist: Standard     Home Equipment: Environmental consultant - 2 wheels;Cane - single  point;Shower seat;Bedside commode;Wheelchair - Press photographer          Prior Functioning/Environment Level of Independence: Needs assistance  Gait / Transfers Assistance Needed: pt reports he was independent with RW though did not use it much, spent more time in w/c ADL's / Homemaking Assistance Needed: wife helps as needed   Comments: Pt not able to provide PLOF this date due to lethargy/impaired cognition    OT Diagnosis: Generalized weakness;Cognitive deficits;Acute pain   OT Problem List: Decreased strength;Decreased activity tolerance;Impaired balance (sitting and/or standing);Decreased cognition;Decreased safety awareness;Decreased knowledge of use of DME or AE;Pain   OT Treatment/Interventions: Self-care/ADL training;Therapeutic activities;Cognitive remediation/compensation;Patient/family education;Balance training;DME and/or AE instruction    OT Goals(Current goals can be found in the care plan section) Acute Rehab OT Goals OT Goal Formulation: Patient unable to participate in goal setting Time For Goal Achievement: 05/31/15 Potential to Achieve Goals: Fair ADL Goals Pt Will Perform Grooming: with supervision;sitting Pt Will Perform Upper Body Bathing: with supervision;sitting Pt Will Perform Upper Body Dressing: with supervision;sitting Pt Will Transfer to Toilet: with min assist;with transfer board;anterior/posterior transfer;bedside commode Additional ADL Goal #1: Pt will sit unsupported at EOB with supervision x 8 minutes in preparation for ADL. Additional ADL Goal #2: Pt will perform bed mobility with supervision. Additional ADL Goal #3: Pt will follow one step commands with 75% accuracy.  OT Frequency: Min 2X/week   Barriers to D/C:            Co-evaluation PT/OT/SLP Co-Evaluation/Treatment: Yes Reason for Co-Treatment: For patient/therapist safety;Necessary to address cognition/behavior during functional activity   OT goals addressed during session:  ADL's and self-care      End of Session Nurse Communication: Mobility status  Activity Tolerance: No increased pain;Patient limited by lethargy Patient left: in bed;with call bell/phone within reach;with chair alarm set   Time: 1335-1408 OT Time Calculation (min): 33 min Charges:  OT General Charges $OT Visit: 1 Procedure OT Evaluation $Initial OT Evaluation Tier I: 1 Procedure G-Codes:    [name] 05/17/2015, 2:42 PM

## 2015-05-17 NOTE — Progress Notes (Signed)
ANTIBIOTIC CONSULT NOTE   Pharmacy Consult for vancomycin and zosyn Indication: left foot gangrene  No Known Allergies  Patient Measurements: Height: 5\' 4"  (162.6 cm) Weight: 103 lb 2.8 oz (46.8 kg) IBW/kg (Calculated) : 59.2 Adjusted Body Weight:   Vital Signs: Temp: 97.9 F (36.6 C) (07/01 0500) Temp Source: Oral (07/01 0500) BP: 175/65 mmHg (07/01 0500) Pulse Rate: 86 (07/01 0500) Intake/Output from previous day: 06/30 0701 - 07/01 0700 In: 430 [P.O.:30; I.V.:400] Out: 2126 [Blood:100] Intake/Output from this shift: Total I/O In: 560 [P.O.:360; IV Piggyback:200] Out: -   Labs:  Recent Labs  05/15/15 0340 05/16/15 0449 05/17/15 0345  WBC 15.9* 16.3* 15.1*  HGB 10.9* 12.0* 8.4*  PLT 182 197 254  CREATININE 7.06* 8.77* 5.17*   Estimated Creatinine Clearance: 9.7 mL/min (by C-G formula based on Cr of 5.17). No results for input(s): VANCOTROUGH, VANCOPEAK, VANCORANDOM, GENTTROUGH, GENTPEAK, GENTRANDOM, TOBRATROUGH, TOBRAPEAK, TOBRARND, AMIKACINPEAK, AMIKACINTROU, AMIKACIN in the last 72 hours.   Microbiology: Recent Results (from the past 720 hour(s))  Culture, blood (routine x 2)     Status: None (Preliminary result)   Collection Time: 05/14/15 11:22 AM  Result Value Ref Range Status   Specimen Description BLOOD LEFT ANTECUBITAL  Final   Special Requests BOTTLES DRAWN AEROBIC ONLY 3CC  Final   Culture NO GROWTH 2 DAYS  Final   Report Status PENDING  Incomplete  Culture, blood (routine x 2)     Status: None (Preliminary result)   Collection Time: 05/14/15  3:36 PM  Result Value Ref Range Status   Specimen Description BLOOD LEFT ARM  Final   Special Requests BOTTLES DRAWN AEROBIC AND ANAEROBIC 5CC  Final   Culture NO GROWTH 2 DAYS  Final   Report Status PENDING  Incomplete  Surgical pcr screen     Status: None   Collection Time: 05/15/15  8:06 PM  Result Value Ref Range Status   MRSA, PCR NEGATIVE NEGATIVE Final   Staphylococcus aureus NEGATIVE NEGATIVE  Final    Comment:        The Xpert SA Assay (FDA approved for NASAL specimens in patients over 66 years of age), is one component of a comprehensive surveillance program.  Test performance has been validated by [facility] for patients greater than or equal to 29 year old. It is not intended to diagnose infection nor to guide or monitor treatment.     Medical History: Past Medical History  Diagnosis Date  . Diabetes mellitus     Type 2. Diagnosed in mid 58s. Currently insulin requiring  . Hyperlipidemia   . ESRD (end stage renal disease) on dialysis     due to diabetic nephropathy. T/Th/Sat  . Tobacco abuse   . Diabetic neuropathy   . Diabetic retinopathy     legally blind  . Hypertension   . Dialysis patient   . Peripheral vascular disease   . Hx of right BKA     Medications:  Scheduled:  . amphetamine-dextroamphetamine  5 mg Oral Q breakfast  . aspirin EC  81 mg Oral Daily  . calcitRIOL  0.5 mcg Oral Q T,Th,Sa-HD  . darbepoetin (ARANESP) injection - DIALYSIS  60 mcg Intravenous Q Thu-HD  . docusate sodium  100 mg Oral BID  . famotidine  20 mg Oral Daily  . feeding supplement (ENSURE ENLIVE)  237 mL Oral BID BM  . ferric gluconate (FERRLECIT/NULECIT) IV  62.5 mg Intravenous Q T,Th,Sa-HD  . heparin  5,000 Units Subcutaneous 3 times per day  .  insulin aspart  0-9 Units Subcutaneous TID WC  . insulin glargine  10 Units Subcutaneous QHS  . lisinopril  20 mg Oral Daily  . metoCLOPramide  5 mg Oral TID AC & HS  . nicotine  21 mg Transdermal Q24H  . pantoprazole  40 mg Oral Daily  . piperacillin-tazobactam (ZOSYN)  IV  2.25 g Intravenous Q8H  . sevelamer carbonate  3,200 mg Oral TID WC  . simvastatin  40 mg Oral q1800  . sodium chloride  3 mL Intravenous Q12H  . vancomycin  500 mg Intravenous Q T,Th,Sa-HD   Assessment: 64 yo male left foot gangrene started on vancomycin and zosyn. Patient s/p L BKA 6/30. No fevers noted, wbc slight trend down to 15 this morning,  end-stage renal disease on HD TThSa - with next session in am.   Will plan on checking vancomycin level next week. No changes to zosyn.  6/28 bld x2 - ngtd  Goal of Therapy:  pre-HD vanc level 15-25 mcg/mL  Plan:  - zosyn 2.25 g iv q8h - vancomycin 500mg  iv qHD (TThSa) - vancomycin level next week  Erin Hearing PharmD., BCPS Clinical Pharmacist Pager [phone] 05/17/2015 1:40 PM

## 2015-05-17 NOTE — Progress Notes (Signed)
Subjective:   No complaints, had L BKA yesterday  Objective Vital signs in last 24 hours: Filed Vitals:   05/16/15 1527 05/16/15 1556 05/16/15 2100 05/17/15 0500  BP: 137/55 141/59 124/65 175/65  Pulse: 87 88 83 86  Temp:   98.2 F (36.8 C) 97.9 F (36.6 C)  TempSrc:   Oral Oral  Resp: 15  16   Height:      Weight:    46.8 kg (103 lb 2.8 oz)  SpO2: 100% 100% 95% 100%   Weight change: -0.3 kg (-10.6 oz)  Physical Exam: General:Alert, thin , chronically  Ill BM , NAD Appears Baseline MS OX3 Heart: RRR , no mur , rub, or gallop Lungs: CTA bilat , non labored breathing  Abdomen: soft NT, ND  Extremities: bilat BKA, no edema Access: pos. Bruit R UA AVF  TTS [place]  3.5h   52kg    Bath 2/2.25   Heparin none   RUA AVF Calcitriol 0.5 ug, Mircera 50 q 4wks next 6/28 Venofer 100 x 5 thru 7/7 Other Op labs hgb 11.2 05/09/15 tfs 10%   Assessment: 1. LBKA 6/30 for gangrenous foot - stable , no vanc/zosyn 2. Cardiac - +trop, cards following 3. AMS - resolved 4. ESRD HD TTS 5. HTN/ vol - just under dry wt, no vol excess on exam 6. Anemia - hgbdown 8.4, cont esa/Fe 7. Metabolic bone disease - cont vit D/ binders 8. IIDDM- per admit 9. Nutrition - renal ,carb mod Renal vitamin  Plan - HD tomorrow  Kelly Splinter MD pager [phone]    cell [phone] 05/17/2015, 10:19 AM     Labs: Basic Metabolic Panel:  Recent Labs Lab 05/15/15 0340 05/16/15 0449 05/17/15 0345  NA 140 136 139  K 3.9 4.1 3.8  CL 93* 94* 95*  CO2 31 27 28   GLUCOSE 127* 48* 85  BUN 39* 63* 25*  CREATININE 7.06* 8.77* 5.17*  CALCIUM 8.4* 8.1* 8.0*  CBC:  Recent Labs Lab 05/14/15 1122  05/15/15 0340 05/16/15 0449 05/17/15 0345  WBC 14.3*  --  15.9* 16.3* 15.1*  NEUTROABS 12.4*  --   --  13.8*  --   HGB 10.0*  < > 10.9* 12.0* 8.4*  HCT 30.2*  < > 33.2* 35.9* 25.6*  MCV 94.4  --  94.1 93.7 97.0  PLT 160  --  182 197 254  < > = values in this interval not displayed. Cardiac  Enzymes:  Recent Labs Lab 05/14/15 1122 05/14/15 1558 05/14/15 2210 05/15/15 0340  TROPONINI 1.17* 1.05* 0.86* 0.72*   CBG:  Recent Labs Lab 05/16/15 1229 05/16/15 1458 05/16/15 1623 05/16/15 2155 05/17/15 0721  GLUCAP 56* 129* 138* 134* 71     Medications:   . amphetamine-dextroamphetamine  5 mg Oral Q breakfast  . aspirin EC  81 mg Oral Daily  . calcitRIOL  0.5 mcg Oral Q T,Th,Sa-HD  . darbepoetin (ARANESP) injection - DIALYSIS  60 mcg Intravenous Q Thu-HD  . docusate sodium  100 mg Oral BID  . famotidine  20 mg Oral Daily  . feeding supplement (ENSURE ENLIVE)  237 mL Oral BID BM  . ferric gluconate (FERRLECIT/NULECIT) IV  62.5 mg Intravenous Q T,Th,Sa-HD  . heparin  5,000 Units Subcutaneous 3 times per day  . insulin aspart  0-9 Units Subcutaneous TID WC  . insulin glargine  10 Units Subcutaneous QHS  . lisinopril  20 mg Oral Daily  . metoCLOPramide  5 mg Oral TID AC & HS  .  nicotine  21 mg Transdermal Q24H  . pantoprazole  40 mg Oral Daily  . piperacillin-tazobactam (ZOSYN)  IV  2.25 g Intravenous Q8H  . sevelamer carbonate  3,200 mg Oral TID WC  . simvastatin  40 mg Oral q1800  . sodium chloride  3 mL Intravenous Q12H  . vancomycin  500 mg Intravenous Q T,Th,Sa-HD

## 2015-05-17 NOTE — Consult Note (Signed)
Physical Medicine and Rehabilitation Consult Reason for Consult: New Left BKA with history of right BKA Referring Physician: [facility]   HPI: Garrett Salas is a 64 y.o. 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 Patient lives with his wife he has been fitted with a recent prosthesis for right BKA per [employer]. Presented 05/14/2015 with low-grade fever left foot gangrenous ischemic changes. Mild elevation in troponin follow-up per cardiology services felt to be demand ischemia. Recent angiogram showed heavily calcified stenosis with occluded popliteal. Limb was not felt to be salvageable and patient was cleared for surgery.  Underwent left BKA 05/16/2015 per Dr. Bridgett Larsson. Hospital course pain management. Acute blood loss anemia 8.4 and monitored. Hemodialysis ongoing as per renal services. Subcutaneous heparin added for DVT prophylaxis. Physical and occupational therapy evaluations are pending. M.D. has requested physical medicine rehabilitation consult   Review of Systems  Constitutional: Positive for fever and chills.  HENT: Negative for hearing loss.   Eyes: Negative for blurred vision and double vision.  Respiratory: Positive for cough. Negative for shortness of breath.   Cardiovascular: Positive for palpitations and leg swelling.  Gastrointestinal: Positive for nausea and constipation.  Genitourinary: Positive for urgency.  Musculoskeletal: Positive for myalgias and back pain.  Skin: Negative for rash.  Neurological: Positive for tingling and weakness. Negative for seizures and headaches.   Past Medical History  Diagnosis Date  . Diabetes mellitus     Type 2. Diagnosed in mid 56s. Currently insulin requiring  . Hyperlipidemia   . ESRD (end stage renal disease) on dialysis     due  to diabetic nephropathy. T/Th/Sat  . Tobacco abuse   . Diabetic neuropathy   . Diabetic retinopathy     legally blind  . Hypertension   . Dialysis patient   . Peripheral vascular disease   . Hx of right BKA    Past Surgical History  Procedure Laterality Date  . None    . Cardiac catheterization  05/06/2011    no significant CAD, EF 55-60%, LVEDP :15   . Av fistula placement    . Abdominal aortagram N/A 01/07/2015    Procedure: ABDOMINAL Maxcine Ham;  Surgeon: Conrad Americus, MD;  Location: [facility] CATH LAB;  Service: Cardiovascular;  Laterality: N/A;  . Amputation Right 01/09/2015    Procedure: AMPUTATION BELOW KNEE RIGHT LEG;  Surgeon: Conrad Kapolei, MD;  Location: [place];  Service: Vascular;  Laterality: Right;   Family History  Problem Relation Age of Onset  . Heart disease    . Alcohol abuse    . Diabetes Mother   . Diabetes Sister   . Diabetes Brother    Social History:  reports that he has been smoking Cigarettes.  He has a 30 pack-year smoking history. He has never used smokeless tobacco. He reports that he uses illicit drugs (Marijuana). He reports that he does not drink alcohol. Allergies: No Known Allergies Medications Prior to Admission  Medication Sig Dispense Refill  . ACCU-CHEK AVIVA PLUS test strip     . ACCU-CHEK SOFTCLIX LANCETS lancets     . albuterol (PROVENTIL HFA;VENTOLIN HFA) 108 (90 BASE) MCG/ACT inhaler Inhale 2 puffs into the lungs every 6 (six) hours as needed for wheezing or shortness of breath.    . amphetamine-dextroamphetamine (ADDERALL) 5 MG tablet Take 5 mg by mouth  daily.     . aspirin EC 81 MG tablet Take 81 mg by mouth daily.    . B-D UF III MINI PEN NEEDLES 31G X 5 MM MISC     . Blood Glucose Monitoring Suppl (ACCU-CHEK AVIVA PLUS) W/DEVICE KIT     . HUMULIN 70/30 KWIKPEN (70-30) 100 UNIT/ML PEN Inject 5-20 Units into the skin 2 (two) times daily as needed (CBG over 175).   5  . HYDROcodone-acetaminophen (NORCO) 10-325 MG per tablet Take 1 tablet by  mouth every 6 (six) hours as needed. 30 tablet 0  . insulin glargine (LANTUS) 100 unit/mL SOPN Inject 25 Units into the skin at bedtime.    Marland Kitchen lisinopril (PRINIVIL,ZESTRIL) 20 MG tablet Take 20 mg by mouth daily.    . metoCLOPramide (REGLAN) 5 MG tablet Take 5 mg by mouth 4 (four) times daily -  before meals and at bedtime.    . pregabalin (LYRICA) 75 MG capsule Take 75 mg by mouth 3 (three) times daily.    . ranitidine (ZANTAC) 150 MG tablet Take 150 mg by mouth daily.     . sevelamer (RENVELA) 800 MG tablet Take 3,200 mg by mouth 3 (three) times daily with meals.     . silver sulfADIAZINE (SILVADENE) 1 % cream Apply 1 application topically daily. 50 g 0  . ULTICARE INSULIN SYRINGE 29G X 1/2" 0.5 ML MISC     . simvastatin (ZOCOR) 40 MG tablet Take 40 mg by mouth daily.       Home: Home Living Family/patient expects to be discharged to:: Private residence Living Arrangements: Spouse/significant other, Children Available Help at Discharge: Family, Available PRN/intermittently, Personal care attendant Type of Home: Mobile home Home Access: Ramped entrance [place]: One level [place]: Environmental consultant - 2 wheels, [employer] - single point, Civil engineer, contracting, Bedside commode, Wheelchair - manual, Risk analyst History: Prior Function Level of Independence: Needs assistance Gait / Transfers Assistance Needed: pt reports he was independent with RW though did not use it much, spent more time in w/c ADL's / Homemaking Assistance Needed: wife helps as needed Functional Status:  Mobility: Bed Mobility Overal bed mobility: Needs Assistance Bed Mobility: Supine to Sit Supine to sit: Min assist General bed mobility comments: could not get to EOB on his own with bed rail, was able to achieve partial sitting position with a firm hand to grasp but then could not scoot hips to EOB without assist. Returned to SL and therapist helped pt slide hips fwd first and them min  A to elevate trunk and pt  could participate more with scooting EOB as he was further out.  Transfers Overall transfer level: Needs assistance Equipment used: Rolling walker (2 wheeled) Transfers: Sit to/from Stand, Stand Pivot Transfers Sit to Stand: +2 physical assistance, Mod assist Stand pivot transfers: Min assist, +2 physical assistance General transfer comment: pt had difficulty donning right prosthesis, could not get clicked in, assisted by therapist with this, had diffiuclty problem solving task. Mod A +2 for sit to stand and pt with inability to fully straighten right knee. Lacked upper body strength and coordination to take full wt and hop on prosthesis so still relying on LLE to pivot to chair, despite vc's for NWB on that side. Will need to learn sliding board transfers after surgery.  Ambulation/Gait General Gait Details: unable    ADL:    Cognition: Cognition Overall Cognitive Status: Impaired/Different from baseline Orientation Level: Oriented X4 (confused at times) Cognition Arousal/Alertness: Awake/alert Behavior  During Therapy: WFL for tasks assessed/performed Overall Cognitive Status: Impaired/Different from baseline Area of Impairment: Following commands, Problem solving Following Commands: Follows one step commands consistently, Follows one step commands with increased time, Follows multi-step commands consistently, Follows multi-step commands with increased time Problem Solving: Slow processing, Difficulty sequencing, Requires verbal cues, Requires tactile cues General Comments: pt had difficulty donning prosthesis and he report that this is not normal for him. Was verbally and physically slow to repond to questions and commands.   Blood pressure 124/65, pulse 83, temperature 98.2 F (36.8 C), temperature source Oral, resp. rate 16, height 5' 4"  (1.626 m), weight 48 kg (105 lb 13.1 oz), SpO2 95 %. Physical Exam  Constitutional: No distress.  HENT:  Head: Normocephalic.  Eyes: EOM are  normal.  Neck: Normal range of motion. Neck supple. No thyromegaly present.  Cardiovascular: Normal rate and regular rhythm.   Respiratory: Effort normal and breath sounds normal. No respiratory distress.  GI: Soft. Bowel sounds are normal. He exhibits no distension.  Neurological:  Lethargic but arousable. Complains of stomach pain. Oriented 3. Follows simple commands. Moves all 4's. Able to lift LLE off the bed. Keeps knee bent. UE's 5/5  Skin: He is not diaphoretic.  Left BKA is dressed. Right BKA well-healed  Psychiatric:  Confused but can recall some biographical info.     Results for orders placed or performed during the hospital encounter of 05/14/15 (from the past 24 hour(s))  Glucose, capillary     Status: Abnormal   Collection Time: 05/16/15  7:36 AM  Result Value Ref Range   Glucose-Capillary 40 (LL) 65 - 99 mg/dL  Glucose, capillary     Status: Abnormal   Collection Time: 05/16/15  8:37 AM  Result Value Ref Range   Glucose-Capillary 62 (L) 65 - 99 mg/dL  Glucose, capillary     Status: None   Collection Time: 05/16/15 10:32 AM  Result Value Ref Range   Glucose-Capillary 65 65 - 99 mg/dL  Glucose, capillary     Status: Abnormal   Collection Time: 05/16/15 12:29 PM  Result Value Ref Range   Glucose-Capillary 56 (L) 65 - 99 mg/dL  Glucose, capillary     Status: Abnormal   Collection Time: 05/16/15  2:58 PM  Result Value Ref Range   Glucose-Capillary 129 (H) 65 - 99 mg/dL   Comment 1 Notify RN    Comment 2 Document in Chart   Glucose, capillary     Status: Abnormal   Collection Time: 05/16/15  4:23 PM  Result Value Ref Range   Glucose-Capillary 138 (H) 65 - 99 mg/dL  Glucose, capillary     Status: Abnormal   Collection Time: 05/16/15  9:55 PM  Result Value Ref Range   Glucose-Capillary 134 (H) 65 - 99 mg/dL  Basic metabolic panel     Status: Abnormal   Collection Time: 05/17/15  3:45 AM  Result Value Ref Range   Sodium 139 135 - 145 mmol/L   Potassium 3.8 3.5 -  5.1 mmol/L   Chloride 95 (L) 101 - 111 mmol/L   CO2 28 22 - 32 mmol/L   Glucose, Bld 85 65 - 99 mg/dL   BUN 25 (H) 6 - 20 mg/dL   Creatinine, Ser 5.17 (H) 0.61 - 1.24 mg/dL   Calcium 8.0 (L) 8.9 - 10.3 mg/dL   GFR calc non Af Amer 11 (L) >60 mL/min   GFR calc Af Amer 12 (L) >60 mL/min   Anion gap 16 (H) 5 -  15  CBC     Status: Abnormal   Collection Time: 05/17/15  3:45 AM  Result Value Ref Range   WBC 15.1 (H) 4.0 - 10.5 K/uL   RBC 2.64 (L) 4.22 - 5.81 MIL/uL   Hemoglobin 8.4 (L) 13.0 - 17.0 g/dL   HCT 25.6 (L) 39.0 - 52.0 %   MCV 97.0 78.0 - 100.0 fL   MCH 31.8 26.0 - 34.0 pg   MCHC 32.8 30.0 - 36.0 g/dL   RDW 15.3 11.5 - 15.5 %   Platelets 254 150 - 400 K/uL   No results found.  Assessment/Plan: Diagnosis: new left BKA, old right BKA 1. Does the need for close, 24 hr/day medical supervision in concert with the patient's rehab needs make it unreasonable for this patient to be served in a less intensive setting? Potentially 2. Co-Morbidities requiring supervision/potential complications: ESRD, DM,  3. Due to bladder management, bowel management, safety, skin/wound care, disease management, medication administration, pain management and patient education, does the patient require 24 hr/day rehab nursing? Potentially 4. Does the patient require coordinated care of a physician, rehab nurse, PT (1-2 hrs/day, 5 days/week) and OT (1-2 hrs/day, 5 days/week) to address physical and functional deficits in the context of the above medical diagnosis(es)? Potentially Addressing deficits in the following areas: balance, endurance, locomotion, strength, transferring, bowel/bladder control, bathing, dressing, feeding, grooming, toileting and psychosocial support 5. Can the patient actively participate in an intensive therapy program of at least 3 hrs of therapy per day at least 5 days per week? Potentially 6. The potential for patient to make measurable gains while on inpatient rehab is  good 7. Anticipated functional outcomes upon discharge from inpatient rehab are modified independent  with PT, modified independent with OT, modified independent with SLP. 8. Estimated rehab length of stay to reach the above functional goals is: potentially 7-10 days 9. Does the patient have adequate social supports and living environment to accommodate these discharge functional goals? Yes 10. Anticipated D/C setting: Home 11. Anticipated post D/C treatments: HH therapy and Outpatient therapy 12. Overall Rehab/Functional Prognosis: excellent  RECOMMENDATIONS: This patient's condition is appropriate for continued rehabilitative care in the following setting: Potentially CIR Patient has agreed to participate in recommended program. N/A Note that insurance prior authorization may be required for reimbursement for recommended care.  Comment: Pt is post-op day #1. Still fairly confused. Will follow for functional and medical progress.   Meredith Staggers, MD, [facility] 05/17/2015     05/17/2015

## 2015-05-17 NOTE — Progress Notes (Signed)
Await PT and OT reevaluation postop to assist in determining if pt would be a candidate for an inpt rehab admission. Noted difficulty problem solving prior to surgery. [id number]

## 2015-05-17 NOTE — Evaluation (Addendum)
Physical Therapy Re-Evaluation Patient Details Name: Garrett Salas MRN: [id number] DOB: 23-Jul-1951 Today's Date: 05/17/2015   History of Present Illness  Garrett Salas is a 64 y.o. male with history of ESRD on hemodialysis TTS, peripheral vascular disease, right BKA, diabetes.  Patient admitted 05/14/15 with generalized weakness, subjective fevers, left foot pain and gangrene. In ED, found to have elevated troponin 1.17 with EKG changes. Denies any chest pain. 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  Clinical Impression  Patient presents with problems listed below.  Will benefit from acute PT to maximize functional mobility prior to discharge. Patient requiring +2 assist today due to lethargy/pain.  Recommend Inpatient Rehab stay prior to discharge home.    Follow Up Recommendations CIR;Supervision/Assistance - 24 hour    Equipment Recommendations  Other (comment) (TBD)    Recommendations for Other Services Rehab consult     Precautions / Restrictions Precautions Precautions: Fall Precaution Comments: B BKA Restrictions Weight Bearing Restrictions: No      Mobility  Bed Mobility Overal bed mobility: Needs Assistance;+2 for physical assistance Bed Mobility: Supine to Sit;Sit to Supine     Supine to sit: +2 for physical assistance;Max assist Sit to supine: +2 for physical assistance;Max assist   General bed mobility comments: Verbal and tactile cues to sit EOB - patient unable to initiate movement.  Required +2 max assist to bring LE's off of bed and raise trunk to sitting.  Once upright, patient requires mod assist to maintain balance due to lethargy.  Is able to maintain balance for 10 seconds with min guard assist.  Returned to supine with +2 max assist.  Transfers                 General transfer comment: Unable due to lethargy  Ambulation/Gait                Stairs            Wheelchair Mobility    Modified Rankin (Stroke Patients Only)       Balance  Overall balance assessment: Needs assistance Sitting-balance support: Single extremity supported Sitting balance-Leahy Scale: Poor Sitting balance - Comments: mod assist, sat x 6 minutes, c/o dizziness with BP 137/65                                     Pertinent Vitals/Pain Pain Assessment: Faces Faces Pain Scale: Hurts whole lot Pain Location: RLE Pain Descriptors / Indicators: Grimacing;Guarding Pain Intervention(s): Limited activity within patient's tolerance;Repositioned    Home Living Family/patient expects to be discharged to:: Private residence Living Arrangements: Spouse/significant other;Children Available Help at Discharge: Family;Available PRN/intermittently;Personal care attendant Type of Home: Mobile home Home Access: Ramped entrance     Home Layout: One level Home Equipment: [place] - 2 wheels;Cane - single point;Shower seat;Bedside commode;Wheelchair - Press photographer      Prior Function Level of Independence: Needs assistance   Gait / Transfers Assistance Needed: pt reports he was independent with RW though did not use it much, spent more time in w/c  ADL's / Homemaking Assistance Needed: wife helps as needed  Comments: Pt not able to provide PLOF this date due to lethargy/impaired cognition     Hand Dominance        Extremity/Trunk Assessment   Upper Extremity Assessment: Defer to OT evaluation           Lower Extremity Assessment: Generalized weakness;RLE deficits/detail;LLE deficits/detail RLE Deficits /  Details: Prior BKA.  Limited in knee extension.  Able to move RLE against gravity. LLE Deficits / Details: Difficult to assess due to pain/cognition.  Limited in knee extension.  Able to raise LLE against gravity  Cervical / Trunk Assessment: Kyphotic  Communication   Communication: No difficulties  Cognition Arousal/Alertness: Lethargic;Suspect due to medications Behavior During Therapy: Flat affect Overall Cognitive  Status: Impaired/Different from baseline Area of Impairment: Orientation;Attention;Memory;Following commands;Problem solving Orientation Level: Disoriented to;Place;Time;Situation Current Attention Level: Focused   Following Commands: Follows one step commands inconsistently;Follows one step commands with increased time     Problem Solving: Slow processing;Difficulty sequencing;Requires verbal cues;Requires tactile cues General Comments: Pt with lethargy. Perseverating about his brother, Liliane Channel, who called early in session    General Comments      Exercises        Assessment/Plan    PT Assessment Patient needs continued PT services  PT Diagnosis Generalized weakness;Acute pain;Altered mental status   PT Problem List Decreased strength;Decreased range of motion;Decreased activity tolerance;Decreased balance;Decreased mobility;Decreased cognition;Pain  PT Treatment Interventions DME instruction;Functional mobility training;Therapeutic activities;Therapeutic exercise;Balance training;Patient/family education   PT Goals (Current goals can be found in the Care Plan section) Acute Rehab PT Goals Patient Stated Goal: Patient unable to state PT Goal Formulation: Patient unable to participate in goal setting Goals revised in Care Plan section Time For Goal Achievement: 05/24/15 Potential to Achieve Goals: Good    Frequency Min 3X/week   Barriers to discharge        Co-evaluation PT/OT/SLP Co-Evaluation/Treatment: Yes Reason for Co-Treatment: For patient/therapist safety PT goals addressed during session: Mobility/safety with mobility OT goals addressed during session: ADL's and self-care       End of Session   Activity Tolerance: Patient limited by lethargy Patient left: in bed;with call bell/phone within reach;with bed alarm set Nurse Communication: Mobility status         Time: [id number] PT Time Calculation (min) (ACUTE ONLY): 24 min   Charges:   PT Evaluation $PT  Re-evaluation: 1 Procedure     PT G CodesDespina Pole 06-04-15, 3:58 PM Carita Pian. Sanjuana Kava, [place] Pager [phone]

## 2015-05-17 NOTE — Progress Notes (Signed)
[facility] PROGRESS NOTE  Garrett Salas [id number] DOB: 10/05/1951 DOA: 05/14/2015 PCP: [name], MD  Assessment/Plan: #1 wet gangrene of the left foot with critical limb ischemia Patient has been seen in consultation by vascular surgery patient noted to have some tenderness to palpation consistent with ascending cellulitis of his foot. It is felt per vascular surgery the patient's left foot is not salvageable. 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  Cultures pending. Continue empiric Antibiotics. Patient's pain regimen has been adjusted per vascular surgery. Patient noted to be confused. Discontinue IV morphine. Place on Tylenol # 3 as needed. DC Norco. Continue oxycodone as needed. Vascular surgery following.  #2 acute encephalopathy Likely secondary to problem #1 and now likely narcotic induced as patient is pleasantly confused. Patient just received some morphine early on this morning. Oxycodone was added to patient's regimen. Will limit narcotic medications as much as possible. Discontinue IV morphine sulfate and norco. Place on Tylenol # 3 as needed. Continue empiric antibiotics at this time.  #3 elevated troponin and EKG changes Patient has been seen in consultation by cardiology who patient likely had a demand ischemia secondary to his acute infection. Wet gangrene of the left foot with cellulitis.patient denies any chest pain. 2-D echo which was done early on showed a normal LV function. His felt that elevated troponin I EKG changes likely secondary to acute infection and severe hypertension. Cardiology feels no further cardiac workup is needed and to manage blood pressure. Appreciate cardiology and per recommendations.  #4 end-stage renal disease on hemodialysis Per nephrology.  #5 hypertensive urgency  Improvement with blood pressure post hemodialysis. Continue lisinopril 20 mg daily.  #6 diabetes mellitus with neuropathy HgbA1C = 6.2. CBGs 71-144. Continue Lantus 10  units daily. Continue sliding scale insulin. Continue Reglan.  #7 generalized weakness Likely secondary to problem #1. PT/OT.  #8 prophylaxis Heparin for DVT prophylaxis.   Code Status: full Family Communication: updated patient. No family at bedside. Disposition Plan: CIR versus SNF pending PT eval.   Consultants:  Vascular surgery: Dr. Trula Slade 05/14/2015  Nephrology: Dr. Jonnie Finner 05/14/2015  Cardiology: Dr. Martinique 05/14/2015  Procedures:  MRI left foot 05/14/2015  Chest x-ray 05/14/2015  Left BKA 05/16/15  Antibiotics:  IV Zosyn 05/14/2015  IV vancomycin 05/14/2015  HPI/Subjective: Patient pleasantly confused. Patient complaining of pain on left stump.  Objective: Filed Vitals:   05/17/15 1554  BP: 165/67  Pulse: 94  Temp: 98.4 F (36.9 C)  Resp: 14    Intake/Output Summary (Last 24 hours) at 05/17/15 1806 Last data filed at 05/17/15 1000  Gross per 24 hour  Intake    560 ml  Output      0 ml  Net    560 ml   Filed Weights   05/16/15 0748 05/16/15 1140 05/17/15 0500  Weight: 51 kg (112 lb 7 oz) 48 kg (105 lb 13.1 oz) 46.8 kg (103 lb 2.8 oz)    Exam:   General:  NAD  Cardiovascular: RRR  Respiratory: CTAB  Abdomen: Soft, nontender, nondistended, positive bowel sounds.  Musculoskeletal: No clubbing or cyanosis. S/p left BKA  Data Reviewed: Basic Metabolic Panel:  Recent Labs Lab 05/14/15 1122 05/14/15 1128 05/15/15 0340 05/16/15 0449 05/17/15 0345  NA 138 133* 140 136 139  K 4.5 4.4 3.9 4.1 3.8  CL 92* 96* 93* 94* 95*  CO2 26  --  31 27 28   GLUCOSE 176* 176* 127* 48* 85  BUN 64* 58* 39* 63* 25*  CREATININE 10.13* 10.10* 7.06*  8.77* 5.17*  CALCIUM 8.4*  --  8.4* 8.1* 8.0*  MG  --   --   --  2.6*  --    Liver Function Tests:  Recent Labs Lab 05/16/15 0449  AST 29  ALT 11*  ALKPHOS 69  BILITOT 0.7  PROT 7.1  ALBUMIN 2.4*   No results for input(s): LIPASE, AMYLASE in the last 168 hours. No results for input(s):  AMMONIA in the last 168 hours. CBC:  Recent Labs Lab 05/14/15 1122 05/14/15 1128 05/15/15 0340 05/16/15 0449 05/17/15 0345  WBC 14.3*  --  15.9* 16.3* 15.1*  NEUTROABS 12.4*  --   --  13.8*  --   HGB 10.0* 10.9* 10.9* 12.0* 8.4*  HCT 30.2* 32.0* 33.2* 35.9* 25.6*  MCV 94.4  --  94.1 93.7 97.0  PLT 160  --  182 197 254   Cardiac Enzymes:  Recent Labs Lab 05/14/15 1122 05/14/15 1558 05/14/15 2210 05/15/15 0340  TROPONINI 1.17* 1.05* 0.86* 0.72*   BNP (last 3 results) No results for input(s): BNP in the last 8760 hours.  ProBNP (last 3 results) No results for input(s): PROBNP in the last 8760 hours.  CBG:  Recent Labs Lab 05/16/15 1623 05/16/15 2155 05/17/15 0721 05/17/15 1108 05/17/15 1636  GLUCAP 138* 134* 71 106* 144*    Recent Results (from the past 240 hour(s))  Culture, blood (routine x 2)     Status: None (Preliminary result)   Collection Time: 05/14/15 11:22 AM  Result Value Ref Range Status   Specimen Description BLOOD LEFT ANTECUBITAL  Final   Special Requests BOTTLES DRAWN AEROBIC ONLY 3CC  Final   Culture NO GROWTH 3 DAYS  Final   Report Status PENDING  Incomplete  Culture, blood (routine x 2)     Status: None (Preliminary result)   Collection Time: 05/14/15  3:36 PM  Result Value Ref Range Status   Specimen Description BLOOD LEFT ARM  Final   Special Requests BOTTLES DRAWN AEROBIC AND ANAEROBIC 5CC  Final   Culture NO GROWTH 3 DAYS  Final   Report Status PENDING  Incomplete  Surgical pcr screen     Status: None   Collection Time: 05/15/15  8:06 PM  Result Value Ref Range Status   MRSA, PCR NEGATIVE NEGATIVE Final   Staphylococcus aureus NEGATIVE NEGATIVE Final    Comment:        The Xpert SA Assay (FDA approved for NASAL specimens in patients over 30 years of age), is one component of a comprehensive surveillance program.  Test performance has been validated by [facility] for patients greater than or equal to 31 year old. It is not  intended to diagnose infection nor to guide or monitor treatment.      Studies: No results found.  Scheduled Meds: . amphetamine-dextroamphetamine  5 mg Oral Q breakfast  . aspirin EC  81 mg Oral Daily  . calcitRIOL  0.5 mcg Oral Q T,Th,Sa-HD  . darbepoetin (ARANESP) injection - DIALYSIS  60 mcg Intravenous Q Thu-HD  . docusate sodium  100 mg Oral BID  . famotidine  20 mg Oral Daily  . feeding supplement (ENSURE ENLIVE)  237 mL Oral BID BM  . ferric gluconate (FERRLECIT/NULECIT) IV  62.5 mg Intravenous Q T,Th,Sa-HD  . heparin  5,000 Units Subcutaneous 3 times per day  . insulin aspart  0-9 Units Subcutaneous TID WC  . insulin glargine  10 Units Subcutaneous QHS  . lisinopril  20 mg Oral Daily  .  metoCLOPramide  5 mg Oral TID AC & HS  . nicotine  21 mg Transdermal Q24H  . pantoprazole  40 mg Oral Daily  . piperacillin-tazobactam (ZOSYN)  IV  2.25 g Intravenous Q8H  . sevelamer carbonate  3,200 mg Oral TID WC  . simvastatin  40 mg Oral q1800  . sodium chloride  3 mL Intravenous Q12H  . vancomycin  500 mg Intravenous Q T,Th,Sa-HD   Continuous Infusions:   Principal Problem:   Gangrene of foot Active Problems:   Critical lower limb ischemia   Hypertension   ESRD on dialysis   HLD (hyperlipidemia)   DM (diabetes mellitus) type II controlled peripheral vascular disorder   PAD (peripheral artery disease)   Generalized weakness   Hypertensive heart disease   Gangrene of lower extremity   ESRD on hemodialysis   Hypertensive urgency   Acute encephalopathy   Elevated troponin   Protein-calorie malnutrition, severe    Time spent: 44 minutes    Takisha Pelle M.D. [facility] Pager [phone]. If 7PM-7AM, please contact night-coverage at [email or web address], password [email or web address] 05/17/2015, 6:06 PM  LOS: 3 days

## 2015-05-17 NOTE — Progress Notes (Addendum)
  [facility] Progress Note  05/17/2015 7:36 AM POD 1  Subjective:  Having some pain with left stump   Filed Vitals:   05/17/15 0500  BP: 175/65  Pulse: 86  Temp: 97.9 F (36.6 C)  Resp:     Physical Exam: Incisions:  Left staple line clean and intact. Skin edges viable. Some mild erythema to anterior stump, proximal to staple line.   CBC    Component Value Date/Time   WBC 15.1* 05/17/2015 0345   RBC 2.64* 05/17/2015 0345   HGB 8.4* 05/17/2015 0345   HCT 25.6* 05/17/2015 0345   PLT 254 05/17/2015 0345   MCV 97.0 05/17/2015 0345   MCH 31.8 05/17/2015 0345   MCHC 32.8 05/17/2015 0345   RDW 15.3 05/17/2015 0345   LYMPHSABS 1.5 05/16/2015 0449   MONOABS 0.9 05/16/2015 0449   EOSABS 0.1 05/16/2015 0449   BASOSABS 0.0 05/16/2015 0449    BMET    Component Value Date/Time   NA 139 05/17/2015 0345   K 3.8 05/17/2015 0345   CL 95* 05/17/2015 0345   CO2 28 05/17/2015 0345   GLUCOSE 85 05/17/2015 0345   BUN 25* 05/17/2015 0345   CREATININE 5.17* 05/17/2015 0345   CALCIUM 8.0* 05/17/2015 0345   GFRNONAA 11* 05/17/2015 0345   GFRAA 12* 05/17/2015 0345    INR    Component Value Date/Time   INR 1.11 05/16/2015 0449     Intake/Output Summary (Last 24 hours) at 05/17/15 0736 Last data filed at 05/16/15 1530  Gross per 24 hour  Intake    430 ml  Output   2126 ml  Net  -1696 ml     Assessment/Plan:  64 y.o. male is s/p left below knee amputation for wet gangrene and critical limb ischemia POD 1  ACE wrap pulled off last night by patient. Redressed this am. Stump is viable. Some erythema to skin of anterior stump. Continue antibiotics. Pain poorly controlled. Hydrocodone not helping. Will try oxycodone to limit morphine use.      Virgina Jock, PA-C [facility] Office: [phone] Pager: [phone] 05/17/2015 7:36 AM  Addendum  Overnight events noted.  Can switch dressing to ABD pad with stump sock to hold in place  tomorrow.  Staples will come out in in the office 4 weeks.  Adele Barthel, MD [facility] [place] Office: [phone] Pager: [phone]  05/17/2015, 9:51 AM

## 2015-05-18 DIAGNOSIS — D62 Acute posthemorrhagic anemia: Secondary | ICD-10-CM | POA: Diagnosis not present

## 2015-05-18 DIAGNOSIS — E43 Unspecified severe protein-calorie malnutrition: Secondary | ICD-10-CM

## 2015-05-18 LAB — GLUCOSE, CAPILLARY
GLUCOSE-CAPILLARY: 106 mg/dL — AB (ref 65–99)
GLUCOSE-CAPILLARY: 98 mg/dL (ref 65–99)
Glucose-Capillary: 107 mg/dL — ABNORMAL HIGH (ref 65–99)
Glucose-Capillary: 105 mg/dL — ABNORMAL HIGH (ref 65–99)

## 2015-05-18 LAB — BASIC METABOLIC PANEL
Anion gap: 16 — ABNORMAL HIGH (ref 5–15)
BUN: 37 mg/dL — AB (ref 6–20)
CO2: 27 mmol/L (ref 22–32)
Calcium: 8.5 mg/dL — ABNORMAL LOW (ref 8.9–10.3)
Chloride: 97 mmol/L — ABNORMAL LOW (ref 101–111)
Creatinine, Ser: 7.25 mg/dL — ABNORMAL HIGH (ref 0.61–1.24)
GFR calc non Af Amer: 7 mL/min — ABNORMAL LOW (ref >60)
GFR, EST AFRICAN AMERICAN: 8 mL/min — AB (ref >60)
GLUCOSE: 122 mg/dL — AB (ref 65–99)
Potassium: 4.3 mmol/L (ref 3.5–5.1)
Sodium: 140 mmol/L (ref 135–145)

## 2015-05-18 LAB — CBC
HCT: 29.9 % — ABNORMAL LOW (ref 39.0–52.0)
Hemoglobin: 9.5 g/dL — ABNORMAL LOW (ref 13.0–17.0)
MCH: 30.4 pg (ref 26.0–34.0)
MCHC: 31.8 g/dL (ref 30.0–36.0)
MCV: 95.8 fL (ref 78.0–100.0)
Platelets: 244 10*3/uL (ref 150–400)
RBC: 3.12 MIL/uL — AB (ref 4.22–5.81)
RDW: 15.5 % (ref 11.5–15.5)
WBC: 11.6 10*3/uL — AB (ref 4.0–10.5)

## 2015-05-18 MED ORDER — LIDOCAINE HCL (PF) 1 % IJ SOLN: 5.0000 mL | INTRAMUSCULAR | Status: DC | PRN

## 2015-05-18 MED ORDER — DEXTROSE 5 % IV SOLN
2.0000 g | INTRAVENOUS | Status: DC
  Administered 2015-05-18 – 2015-05-23 (×3): 2 g via INTRAVENOUS
  Filled 2015-05-18 (×6): qty 2

## 2015-05-18 MED ORDER — ALTEPLASE 2 MG IJ SOLR
2.0000 mg | Freq: Once | INTRAMUSCULAR | Status: DC | PRN
  Filled 2015-05-18: qty 2

## 2015-05-18 MED ORDER — PENTAFLUOROPROP-TETRAFLUOROETH EX AERO: 1.0000 "application " | INHALATION_SPRAY | CUTANEOUS | Status: DC | PRN

## 2015-05-18 MED ORDER — SODIUM CHLORIDE 0.9 % IV SOLN: 100.0000 mL | INTRAVENOUS | Status: DC | PRN

## 2015-05-18 MED ORDER — NEPRO/CARBSTEADY PO LIQD
237.0000 mL | ORAL | Status: DC | PRN
  Filled 2015-05-18: qty 237

## 2015-05-18 MED ORDER — LIDOCAINE-PRILOCAINE 2.5-2.5 % EX CREA
1.0000 "application " | TOPICAL_CREAM | CUTANEOUS | Status: DC | PRN
  Filled 2015-05-18: qty 5

## 2015-05-18 MED ORDER — HEPARIN SODIUM (PORCINE) 1000 UNIT/ML DIALYSIS
1000.0000 [IU] | INTRAMUSCULAR | Status: DC | PRN
  Filled 2015-05-18: qty 1

## 2015-05-18 NOTE — Progress Notes (Signed)
Subjective:   Stable except RN says some confusion possibly due to narcotics  Objective Vital signs in last 24 hours: Filed Vitals:   05/17/15 1554 05/17/15 2032 05/18/15 0438 05/18/15 0450  BP: 165/67 182/77 185/74 142/80  Pulse: 94 92 85   Temp: 98.4 F (36.9 C) 98.5 F (36.9 C) 98.4 F (36.9 C)   TempSrc: Oral Oral Oral   Resp: 14 16 18    Height:      Weight:   46.455 kg (102 lb 6.6 oz)   SpO2: 100% 100% 100%    Weight change: -4.545 kg (-10 lb 0.3 oz)  Physical Exam: General:Alert, thin , chronically ill , no distress , a little groggy Heart: RRR , no mur , rub, or gallop Lungs: CTA bilat , non labored breathing  Abdomen: soft NT, ND  Extremities: bilat BKA, no edema Access: pos. Bruit R UA AVF  TTS [place]  3.5h   52kg    [place] 2/2.25   Heparin none   RUA AVF Calcitriol 0.5 ug, Mircera 50 q 4wks next 6/28 Venofer 100 x 5 thru 7/7 Other Op labs hgb 11.2 05/09/15 tfs 10%   Assessment: 1. LBKA 6/30 for gangrenous foot - stable , no vanc/zosyn 2. AMS - prob from IV MSO4, dc'd this am 3. Cardiac - +trop, cards following 4. AMS - resolved 5. ESRD HD TTS 6. HTN/ vol - appopriate drop in body wt after leg amp, no vol excess on exam 7. Anemia - hgbdown 9.5, cont esa/Fe 8. Metabolic bone disease - cont vit D/ binders 9. IIDDM- per admit 10. Nutrition - renal ,carb mod Renal vitamin  Plan - HD today, no fluid off  Kelly Splinter MD pager [phone]    cell [phone] 05/18/2015, 12:05 PM     Labs: Basic Metabolic Panel:  Recent Labs Lab 05/16/15 0449 05/17/15 0345 05/18/15 0419  NA 136 139 140  K 4.1 3.8 4.3  CL 94* 95* 97*  CO2 27 28 27   GLUCOSE 48* 85 122*  BUN 63* 25* 37*  CREATININE 8.77* 5.17* 7.25*  CALCIUM 8.1* 8.0* 8.5*  CBC:  Recent Labs Lab 05/14/15 1122  05/15/15 0340 05/16/15 0449 05/17/15 0345 05/18/15 0419  WBC 14.3*  --  15.9* 16.3* 15.1* 11.6*  NEUTROABS 12.4*  --   --  13.8*  --   --   HGB 10.0*  < > 10.9* 12.0* 8.4* 9.5*   HCT 30.2*  < > 33.2* 35.9* 25.6* 29.9*  MCV 94.4  --  94.1 93.7 97.0 95.8  PLT 160  --  182 197 254 244  < > = values in this interval not displayed. Cardiac Enzymes:  Recent Labs Lab 05/14/15 1122 05/14/15 1558 05/14/15 2210 05/15/15 0340  TROPONINI 1.17* 1.05* 0.86* 0.72*   CBG:  Recent Labs Lab 05/17/15 1108 05/17/15 1636 05/17/15 2036 05/18/15 0758 05/18/15 1156  GLUCAP 106* 144* 118* 106* 98     Medications:   . amphetamine-dextroamphetamine  5 mg Oral Q breakfast  . aspirin EC  81 mg Oral Daily  . calcitRIOL  0.5 mcg Oral Q T,Th,Sa-HD  . darbepoetin (ARANESP) injection - DIALYSIS  60 mcg Intravenous Q Thu-HD  . docusate sodium  100 mg Oral BID  . famotidine  20 mg Oral Daily  . feeding supplement (ENSURE ENLIVE)  237 mL Oral BID BM  . ferric gluconate (FERRLECIT/NULECIT) IV  62.5 mg Intravenous Q T,Th,Sa-HD  . heparin  5,000 Units Subcutaneous 3 times per day  . insulin aspart  0-9 Units Subcutaneous TID WC  . insulin glargine  10 Units Subcutaneous QHS  . lisinopril  20 mg Oral Daily  . metoCLOPramide  5 mg Oral TID AC & HS  . nicotine  21 mg Transdermal Q24H  . pantoprazole  40 mg Oral Daily  . sevelamer carbonate  3,200 mg Oral TID WC  . simvastatin  40 mg Oral q1800  . sodium chloride  3 mL Intravenous Q12H  . vancomycin  500 mg Intravenous Q T,Th,Sa-HD

## 2015-05-18 NOTE — Progress Notes (Signed)
ANTIBIOTIC CONSULT NOTE   Pharmacy Consult for vancomycin and fortaz Indication: left foot gangrene  No Known Allergies  Patient Measurements: Height: 5\' 4"  (162.6 cm) Weight: 102 lb 6.6 oz (46.455 kg) IBW/kg (Calculated) : 59.2   Vital Signs: Temp: 98.4 F (36.9 C) (07/02 0438) Temp Source: Oral (07/02 0438) BP: 142/80 mmHg (07/02 0450) Pulse Rate: 85 (07/02 0438) Intake/Output from previous day: 07/01 0701 - 07/02 0700 In: 610 [P.O.:360; IV Piggyback:250] Out: -  Intake/Output from this shift: Total I/O In: 300 [P.O.:300] Out: -   Labs:  Recent Labs  05/16/15 0449 05/17/15 0345 05/18/15 0419  WBC 16.3* 15.1* 11.6*  HGB 12.0* 8.4* 9.5*  PLT 197 254 244  CREATININE 8.77* 5.17* 7.25*   Estimated Creatinine Clearance: 6.9 mL/min (by C-G formula based on Cr of 7.25). No results for input(s): VANCOTROUGH, VANCOPEAK, VANCORANDOM, GENTTROUGH, GENTPEAK, GENTRANDOM, TOBRATROUGH, TOBRAPEAK, TOBRARND, AMIKACINPEAK, AMIKACINTROU, AMIKACIN in the last 72 hours.   Microbiology: Recent Results (from the past 720 hour(s))  Culture, blood (routine x 2)     Status: None (Preliminary result)   Collection Time: 05/14/15 11:22 AM  Result Value Ref Range Status   Specimen Description BLOOD LEFT ANTECUBITAL  Final   Special Requests BOTTLES DRAWN AEROBIC ONLY 3CC  Final   Culture NO GROWTH 4 DAYS  Final   Report Status PENDING  Incomplete  Culture, blood (routine x 2)     Status: None (Preliminary result)   Collection Time: 05/14/15  3:36 PM  Result Value Ref Range Status   Specimen Description BLOOD LEFT ARM  Final   Special Requests BOTTLES DRAWN AEROBIC AND ANAEROBIC 5CC  Final   Culture NO GROWTH 4 DAYS  Final   Report Status PENDING  Incomplete  Surgical pcr screen     Status: None   Collection Time: 05/15/15  8:06 PM  Result Value Ref Range Status   MRSA, PCR NEGATIVE NEGATIVE Final   Staphylococcus aureus NEGATIVE NEGATIVE Final    Comment:        The Xpert SA Assay  (FDA approved for NASAL specimens in patients over 48 years of age), is one component of a comprehensive surveillance program.  Test performance has been validated by [facility] for patients greater than or equal to 68 year old. It is not intended to diagnose infection nor to guide or monitor treatment.     Medical History: Past Medical History  Diagnosis Date  . Diabetes mellitus     Type 2. Diagnosed in mid 53s. Currently insulin requiring  . Hyperlipidemia   . ESRD (end stage renal disease) on dialysis     due to diabetic nephropathy. T/Th/Sat  . Tobacco abuse   . Diabetic neuropathy   . Diabetic retinopathy     legally blind  . Hypertension   . Dialysis patient   . Peripheral vascular disease   . Hx of right BKA    Assessment: 64 yo male left foot gangrene started on vancomycin and zosyn. Patient s/p L BKA 6/30. No fevers noted, wbc slight trend down to 11 this morning, end-stage renal disease on HD TThSa - with next session today.   Will plan on checking vancomycin level next week. New orders to change zosyn to fortaz.   6/28 bld x2 - ngtd  Goal of Therapy:  pre-HD vanc level 15-25 mcg/mL  Plan:  - d/c zosyn - Fortaz 2g qHD - start after HD today - vancomycin 500mg  iv qHD (TThSa) - vancomycin level next week  Erin Hearing PharmD., BCPS Clinical Pharmacist Pager [phone] 05/18/2015 12:34 PM

## 2015-05-18 NOTE — Progress Notes (Signed)
[facility] PROGRESS NOTE  Garrett Salas [id number] DOB: 1951/03/24 DOA: 05/14/2015 PCP: [name], MD  Assessment/Plan: #1 wet gangrene of the left foot with critical limb ischemia Patient has been seen in consultation by vascular surgery patient noted to have some tenderness to palpation consistent with ascending cellulitis of his foot. It is felt per vascular surgery the patient's left foot is not salvageable. 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  Cultures pending. Continue empiric Antibiotics. Patient's pain regimen has been adjusted per vascular surgery. Patient noted to be confused yesterday. Discontinued IV morphine. Continue Tylenol # 3 as needed. DC Norco. Will change Zosyn to IV Fortaz and continue vancomycin. Curb-sided ID and recommended continue IV antibiotics during hemodialysis for 2 more weeks postoperative. Continue oxycodone as needed. Vascular surgery following.  #2 acute encephalopathy Likely secondary to problem #1 and now likely narcotic induced as patient is pleasantly confused yesterday and improving. Continue to limit narcotic medications as much as possible. Discontinued IV morphine sulfate and norco. Continue Tylenol # 3 as needed. Continue empiric antibiotics at this time.  #3 elevated troponin and EKG changes Patient has been seen in consultation by cardiology who patient likely had a demand ischemia secondary to his acute infection. Wet gangrene of the left foot with cellulitis.patient denies any chest pain. 2-D echo which was done early on showed a normal LV function. His felt that elevated troponin I EKG changes likely secondary to acute infection and severe hypertension. Cardiology feels no further cardiac workup is needed and to manage blood pressure. Appreciate cardiology and per recommendations.  #4 end-stage renal disease on hemodialysis Per nephrology.  #5 hypertensive urgency  Improvement with blood pressure post hemodialysis. Continue  lisinopril 20 mg daily.  #6 diabetes mellitus with neuropathy HgbA1C = 6.2. CBGs 98-106. Continue Lantus 10 units daily. Continue sliding scale insulin. Continue Reglan.  #7 generalized weakness Likely secondary to problem #1. PT/OT.  #8 postoperative anemia Patient with no overt bleeding. Hemoglobin is stabilized. Anemia panel consistent with anemia of chronic disease. Follow H&H.  #9 prophylaxis Heparin for DVT prophylaxis.   Code Status: full Family Communication: updated patient.  Disposition Plan: CIR when medically stable.   Consultants:  Vascular surgery: Dr. Trula Slade 05/14/2015  Nephrology: Dr. Jonnie Finner 05/14/2015  Cardiology: Dr. Martinique 05/14/2015  Procedures:  MRI left foot 05/14/2015  Chest x-ray 05/14/2015  Left BKA 05/16/15  Antibiotics:  IV Zosyn 05/14/2015>>>> 05/18/2015  IV vancomycin 05/14/2015  IV Tressie Ellis 05/18/2015  HPI/Subjective: Patient in HD.  Objective: Filed Vitals:   05/18/15 1710  BP: 171/71  Pulse: 93  Temp:   Resp: 20    Intake/Output Summary (Last 24 hours) at 05/18/15 1726 Last data filed at 05/18/15 0840  Gross per 24 hour  Intake    300 ml  Output      0 ml  Net    300 ml   Filed Weights   05/17/15 0500 05/18/15 0438 05/18/15 1302  Weight: 46.8 kg (103 lb 2.8 oz) 46.455 kg (102 lb 6.6 oz) 46.6 kg (102 lb 11.8 oz)    Exam:   General:  NAD  Cardiovascular: RRR  Respiratory: CTAB  Abdomen: Soft, nontender, nondistended, positive bowel sounds.  Musculoskeletal: No clubbing or cyanosis. S/p left BKA  Data Reviewed: Basic Metabolic Panel:  Recent Labs Lab 05/14/15 1122 05/14/15 1128 05/15/15 0340 05/16/15 0449 05/17/15 0345 05/18/15 0419  NA 138 133* 140 136 139 140  K 4.5 4.4 3.9 4.1 3.8 4.3  CL 92* 96* 93* 94* 95* 97*  CO2 26  --  31 27 28 27   GLUCOSE 176* 176* 127* 48* 85 122*  BUN 64* 58* 39* 63* 25* 37*  CREATININE 10.13* 10.10* 7.06* 8.77* 5.17* 7.25*  CALCIUM 8.4*  --  8.4* 8.1* 8.0* 8.5*   MG  --   --   --  2.6*  --   --    Liver Function Tests:  Recent Labs Lab 05/16/15 0449  AST 29  ALT 11*  ALKPHOS 69  BILITOT 0.7  PROT 7.1  ALBUMIN 2.4*   No results for input(s): LIPASE, AMYLASE in the last 168 hours. No results for input(s): AMMONIA in the last 168 hours. CBC:  Recent Labs Lab 05/14/15 1122 05/14/15 1128 05/15/15 0340 05/16/15 0449 05/17/15 0345 05/18/15 0419  WBC 14.3*  --  15.9* 16.3* 15.1* 11.6*  NEUTROABS 12.4*  --   --  13.8*  --   --   HGB 10.0* 10.9* 10.9* 12.0* 8.4* 9.5*  HCT 30.2* 32.0* 33.2* 35.9* 25.6* 29.9*  MCV 94.4  --  94.1 93.7 97.0 95.8  PLT 160  --  182 197 254 244   Cardiac Enzymes:  Recent Labs Lab 05/14/15 1122 05/14/15 1558 05/14/15 2210 05/15/15 0340  TROPONINI 1.17* 1.05* 0.86* 0.72*   BNP (last 3 results) No results for input(s): BNP in the last 8760 hours.  ProBNP (last 3 results) No results for input(s): PROBNP in the last 8760 hours.  CBG:  Recent Labs Lab 05/17/15 1108 05/17/15 1636 05/17/15 2036 05/18/15 0758 05/18/15 1156  GLUCAP 106* 144* 118* 106* 98    Recent Results (from the past 240 hour(s))  Culture, blood (routine x 2)     Status: None (Preliminary result)   Collection Time: 05/14/15 11:22 AM  Result Value Ref Range Status   Specimen Description BLOOD LEFT ANTECUBITAL  Final   Special Requests BOTTLES DRAWN AEROBIC ONLY 3CC  Final   Culture NO GROWTH 4 DAYS  Final   Report Status PENDING  Incomplete  Culture, blood (routine x 2)     Status: None (Preliminary result)   Collection Time: 05/14/15  3:36 PM  Result Value Ref Range Status   Specimen Description BLOOD LEFT ARM  Final   Special Requests BOTTLES DRAWN AEROBIC AND ANAEROBIC 5CC  Final   Culture NO GROWTH 4 DAYS  Final   Report Status PENDING  Incomplete  Surgical pcr screen     Status: None   Collection Time: 05/15/15  8:06 PM  Result Value Ref Range Status   MRSA, PCR NEGATIVE NEGATIVE Final   Staphylococcus aureus  NEGATIVE NEGATIVE Final    Comment:        The Xpert SA Assay (FDA approved for NASAL specimens in patients over 63 years of age), is one component of a comprehensive surveillance program.  Test performance has been validated by [facility] for patients greater than or equal to 64 year old. It is not intended to diagnose infection nor to guide or monitor treatment.      Studies: No results found.  Scheduled Meds: . amphetamine-dextroamphetamine  5 mg Oral Q breakfast  . aspirin EC  81 mg Oral Daily  . calcitRIOL  0.5 mcg Oral Q T,Th,Sa-HD  . cefTAZidime (FORTAZ)  IV  2 g Intravenous Q T,Th,Sa-HD  . darbepoetin (ARANESP) injection - DIALYSIS  60 mcg Intravenous Q Thu-HD  . docusate sodium  100 mg Oral BID  . famotidine  20 mg Oral Daily  . feeding supplement (ENSURE ENLIVE)  237 mL Oral BID BM  . ferric gluconate (FERRLECIT/NULECIT) IV  62.5 mg Intravenous Q T,Th,Sa-HD  . heparin  5,000 Units Subcutaneous 3 times per day  . insulin aspart  0-9 Units Subcutaneous TID WC  . insulin glargine  10 Units Subcutaneous QHS  . lisinopril  20 mg Oral Daily  . metoCLOPramide  5 mg Oral TID AC & HS  . nicotine  21 mg Transdermal Q24H  . pantoprazole  40 mg Oral Daily  . sevelamer carbonate  3,200 mg Oral TID WC  . simvastatin  40 mg Oral q1800  . sodium chloride  3 mL Intravenous Q12H  . vancomycin  500 mg Intravenous Q T,Th,Sa-HD   Continuous Infusions:   Principal Problem:   Gangrene of foot Active Problems:   Critical lower limb ischemia   Hypertension   ESRD on dialysis   HLD (hyperlipidemia)   DM (diabetes mellitus) type II controlled peripheral vascular disorder   PAD (peripheral artery disease)   Generalized weakness   Hypertensive heart disease   Gangrene of lower extremity   ESRD on hemodialysis   Hypertensive urgency   Acute encephalopathy   Elevated troponin   Protein-calorie malnutrition, severe   Postoperative anemia due to acute blood loss    Time  spent: 73 minutes    THOMPSON,DANIEL M.D. [facility] Pager [phone]. If 7PM-7AM, please contact night-coverage at [email or web address], password [facility] 05/18/2015, 5:26 PM  LOS: 4 days

## 2015-05-19 LAB — RENAL FUNCTION PANEL
Albumin: 2.6 g/dL — ABNORMAL LOW (ref 3.5–5.0)
Anion gap: 21 — ABNORMAL HIGH (ref 5–15)
BUN: 21 mg/dL — AB (ref 6–20)
CO2: 22 mmol/L (ref 22–32)
Calcium: 8.6 mg/dL — ABNORMAL LOW (ref 8.9–10.3)
Chloride: 96 mmol/L — ABNORMAL LOW (ref 101–111)
Creatinine, Ser: 4.17 mg/dL — ABNORMAL HIGH (ref 0.61–1.24)
GFR calc Af Amer: 16 mL/min — ABNORMAL LOW (ref >60)
GFR, EST NON AFRICAN AMERICAN: 14 mL/min — AB (ref >60)
Glucose, Bld: 137 mg/dL — ABNORMAL HIGH (ref 65–99)
Phosphorus: 4.5 mg/dL (ref 2.5–4.6)
Potassium: 4 mmol/L (ref 3.5–5.1)
Sodium: 139 mmol/L (ref 135–145)

## 2015-05-19 LAB — CULTURE, BLOOD (ROUTINE X 2)
Culture: NO GROWTH
Culture: NO GROWTH

## 2015-05-19 LAB — CBC
HEMATOCRIT: 31.6 % — AB (ref 39.0–52.0)
Hemoglobin: 10 g/dL — ABNORMAL LOW (ref 13.0–17.0)
MCH: 30.7 pg (ref 26.0–34.0)
MCHC: 31.6 g/dL (ref 30.0–36.0)
MCV: 96.9 fL (ref 78.0–100.0)
Platelets: 286 10*3/uL (ref 150–400)
RBC: 3.26 MIL/uL — ABNORMAL LOW (ref 4.22–5.81)
RDW: 15.7 % — AB (ref 11.5–15.5)
WBC: 11.9 10*3/uL — AB (ref 4.0–10.5)

## 2015-05-19 LAB — GLUCOSE, CAPILLARY
GLUCOSE-CAPILLARY: 147 mg/dL — AB (ref 65–99)
GLUCOSE-CAPILLARY: 139 mg/dL — AB (ref 65–99)
GLUCOSE-CAPILLARY: 152 mg/dL — AB (ref 65–99)
Glucose-Capillary: 100 mg/dL — ABNORMAL HIGH (ref 65–99)
Glucose-Capillary: 151 mg/dL — ABNORMAL HIGH (ref 65–99)

## 2015-05-19 MED ORDER — LISINOPRIL 20 MG PO TABS
30.0000 mg | ORAL_TABLET | Freq: Every day | ORAL | Status: DC
Start: 2015-05-19 — End: 2015-05-20
  Administered 2015-05-19: 30 mg via ORAL
  Filled 2015-05-19 (×2): qty 1

## 2015-05-19 NOTE — Progress Notes (Signed)
[facility] PROGRESS NOTE  Garrett Salas [id number] DOB: 05-03-1951 DOA: 05/14/2015 PCP: [name], MD  Assessment/Plan: #1 wet gangrene of the left foot with critical limb ischemia Patient has been seen in consultation by vascular surgery patient noted to have some tenderness to palpation consistent with ascending cellulitis of his foot. It is felt per vascular surgery the patient's left foot is not salvageable. 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  Cultures pending. Continue empiric Antibiotics. Patient's pain regimen has been adjusted per vascular surgery. Patient noted to be confused yesterday. Discontinued IV morphine. Continue Tylenol # 3 as needed. DC Norco. Continue IV Fortaz and IV vancomycin. Curb-sided ID and recommended continue IV antibiotics during hemodialysis for 2 more weeks postoperative. Continue oxycodone as needed. Vascular surgery following.  #2 acute encephalopathy Likely secondary to problem #1 and now likely narcotic induced as patient is pleasantly confused. Patient's mentation has improved significantly today. Continue to limit narcotic medications as much as possible. Discontinued IV morphine sulfate and norco. Continue Tylenol # 3 as needed. Continue empiric antibiotics at this time.  #3 elevated troponin and EKG changes Patient has been seen in consultation by cardiology who patient likely had a demand ischemia secondary to his acute infection. Wet gangrene of the left foot with cellulitis.patient denies any chest pain. 2-D echo which was done early on showed a normal LV function. His felt that elevated troponin I EKG changes likely secondary to acute infection and severe hypertension. Cardiology feels no further cardiac workup is needed and to manage blood pressure.   #4 end-stage renal disease on hemodialysis Per nephrology.  #5 hypertensive urgency  Improvement with blood pressure post hemodialysis. Increase lisinopril 30 mg daily.  #6 diabetes  mellitus with neuropathy HgbA1C = 6.2. CBGs 139-147. Continue Lantus 10 units daily. Continue sliding scale insulin. Continue Reglan.  #7 generalized weakness Likely secondary to problem #1. PT/OT.  #8 postoperative anemia Patient with no overt bleeding. Hemoglobin is stabilized. Anemia panel consistent with anemia of chronic disease. Follow H&H.  #9 prophylaxis Heparin for DVT prophylaxis.   Code Status: full Family Communication: updated patient.  Disposition Plan: CIR when medically stable, hopefully in 1-2 days.   Consultants:  Vascular surgery: Dr. Trula Slade 05/14/2015  Nephrology: Dr. Jonnie Finner 05/14/2015  Cardiology: Dr. Martinique 05/14/2015  Procedures:  MRI left foot 05/14/2015  Chest x-ray 05/14/2015  Left BKA 05/16/15  Antibiotics:  IV Zosyn 05/14/2015>>>> 05/18/2015  IV vancomycin 05/14/2015  IV Tressie Ellis 05/18/2015  HPI/Subjective: Patient alert. Following commands. Patient with improvement in his confusion. Patient states pain is controlled.  Objective: Filed Vitals:   05/19/15 0500  BP: 153/43  Pulse: 96  Temp: 98 F (36.7 C)  Resp:     Intake/Output Summary (Last 24 hours) at 05/19/15 1203 Last data filed at 05/18/15 1715  Gross per 24 hour  Intake      0 ml  Output    500 ml  Net   -500 ml   Filed Weights   05/18/15 0438 05/18/15 1302 05/19/15 0500  Weight: 46.455 kg (102 lb 6.6 oz) 46.6 kg (102 lb 11.8 oz) 47.9 kg (105 lb 9.6 oz)    Exam:   General:  NAD  Cardiovascular: RRR  Respiratory: CTAB  Abdomen: Soft, nontender, nondistended, positive bowel sounds.  Musculoskeletal: No clubbing or cyanosis. S/p left BKA  Data Reviewed: Basic Metabolic Panel:  Recent Labs Lab 05/15/15 0340 05/16/15 0449 05/17/15 0345 05/18/15 0419 05/19/15 0350  NA 140 136 139 140 139  K 3.9 4.1 3.8  4.3 4.0  CL 93* 94* 95* 97* 96*  CO2 31 27 28 27 22   GLUCOSE 127* 48* 85 122* 137*  BUN 39* 63* 25* 37* 21*  CREATININE 7.06* 8.77* 5.17* 7.25*  4.17*  CALCIUM 8.4* 8.1* 8.0* 8.5* 8.6*  MG  --  2.6*  --   --   --   PHOS  --   --   --   --  4.5   Liver Function Tests:  Recent Labs Lab 05/16/15 0449 05/19/15 0350  AST 29  --   ALT 11*  --   ALKPHOS 69  --   BILITOT 0.7  --   PROT 7.1  --   ALBUMIN 2.4* 2.6*   No results for input(s): LIPASE, AMYLASE in the last 168 hours. No results for input(s): AMMONIA in the last 168 hours. CBC:  Recent Labs Lab 05/14/15 1122  05/15/15 0340 05/16/15 0449 05/17/15 0345 05/18/15 0419 05/19/15 0350  WBC 14.3*  --  15.9* 16.3* 15.1* 11.6* 11.9*  NEUTROABS 12.4*  --   --  13.8*  --   --   --   HGB 10.0*  < > 10.9* 12.0* 8.4* 9.5* 10.0*  HCT 30.2*  < > 33.2* 35.9* 25.6* 29.9* 31.6*  MCV 94.4  --  94.1 93.7 97.0 95.8 96.9  PLT 160  --  182 197 254 244 286  < > = values in this interval not displayed. Cardiac Enzymes:  Recent Labs Lab 05/14/15 1122 05/14/15 1558 05/14/15 2210 05/15/15 0340  TROPONINI 1.17* 1.05* 0.86* 0.72*   BNP (last 3 results) No results for input(s): BNP in the last 8760 hours.  ProBNP (last 3 results) No results for input(s): PROBNP in the last 8760 hours.  CBG:  Recent Labs Lab 05/18/15 1750 05/18/15 1805 05/18/15 2123 05/19/15 0752 05/19/15 1152  GLUCAP 100* 107* 105* 147* 139*    Recent Results (from the past 240 hour(s))  Culture, blood (routine x 2)     Status: None (Preliminary result)   Collection Time: 05/14/15 11:22 AM  Result Value Ref Range Status   Specimen Description BLOOD LEFT ANTECUBITAL  Final   Special Requests BOTTLES DRAWN AEROBIC ONLY 3CC  Final   Culture NO GROWTH 4 DAYS  Final   Report Status PENDING  Incomplete  Culture, blood (routine x 2)     Status: None (Preliminary result)   Collection Time: 05/14/15  3:36 PM  Result Value Ref Range Status   Specimen Description BLOOD LEFT ARM  Final   Special Requests BOTTLES DRAWN AEROBIC AND ANAEROBIC 5CC  Final   Culture NO GROWTH 4 DAYS  Final   Report Status  PENDING  Incomplete  Surgical pcr screen     Status: None   Collection Time: 05/15/15  8:06 PM  Result Value Ref Range Status   MRSA, PCR NEGATIVE NEGATIVE Final   Staphylococcus aureus NEGATIVE NEGATIVE Final    Comment:        The Xpert SA Assay (FDA approved for NASAL specimens in patients over 85 years of age), is one component of a comprehensive surveillance program.  Test performance has been validated by [facility] for patients greater than or equal to 14 year old. It is not intended to diagnose infection nor to guide or monitor treatment.      Studies: No results found.  Scheduled Meds: . amphetamine-dextroamphetamine  5 mg Oral Q breakfast  . aspirin EC  81 mg Oral Daily  . calcitRIOL  0.5 mcg  Oral Q T,Th,Sa-HD  . cefTAZidime (FORTAZ)  IV  2 g Intravenous Q T,Th,Sa-HD  . darbepoetin (ARANESP) injection - DIALYSIS  60 mcg Intravenous Q Thu-HD  . docusate sodium  100 mg Oral BID  . famotidine  20 mg Oral Daily  . feeding supplement (ENSURE ENLIVE)  237 mL Oral BID BM  . ferric gluconate (FERRLECIT/NULECIT) IV  62.5 mg Intravenous Q T,Th,Sa-HD  . heparin  5,000 Units Subcutaneous 3 times per day  . insulin aspart  0-9 Units Subcutaneous TID WC  . insulin glargine  10 Units Subcutaneous QHS  . lisinopril  30 mg Oral Daily  . metoCLOPramide  5 mg Oral TID AC & HS  . nicotine  21 mg Transdermal Q24H  . pantoprazole  40 mg Oral Daily  . sevelamer carbonate  3,200 mg Oral TID WC  . simvastatin  40 mg Oral q1800  . sodium chloride  3 mL Intravenous Q12H  . vancomycin  500 mg Intravenous Q T,Th,Sa-HD   Continuous Infusions:   Principal Problem:   Gangrene of foot Active Problems:   Critical lower limb ischemia   Hypertension   ESRD on dialysis   HLD (hyperlipidemia)   DM (diabetes mellitus) type II controlled peripheral vascular disorder   PAD (peripheral artery disease)   Generalized weakness   Hypertensive heart disease   Gangrene of lower extremity    ESRD on hemodialysis   Hypertensive urgency   Acute encephalopathy   Elevated troponin   Protein-calorie malnutrition, severe   Postoperative anemia due to acute blood loss    Time spent: 44 minutes    Gracin Soohoo M.D. [facility] Pager [phone]. If 7PM-7AM, please contact night-coverage at [email or web address], password [facility] 05/19/2015, 12:03 PM  LOS: 5 days

## 2015-05-19 NOTE — Progress Notes (Signed)
Subjective:  No cos , HD yest. He remembers being confused yesterday  Objective Vital signs in last 24 hours: Filed Vitals:   05/18/15 1715 05/18/15 2100 05/18/15 2241 05/19/15 0500  BP: 172/74 216/80 161/62 153/43  Pulse: 97 98  96  Temp:  98 F (36.7 C)  98 F (36.7 C)  TempSrc:  Oral  Oral  Resp: 20     Height:      Weight:    47.9 kg (105 lb 9.6 oz)  SpO2:  99%  100%   Weight change: 0.145 kg (5.1 oz)  Physical Exam: General:Alert, now oriented to Pace appears baseline Heart: RRR , no mur , rub, or gallop Lungs: CTA bilat , non labored breathing  Abdomen: soft NT, ND  Extremities: bilat BKA, no edema Access: pos. Bruit R UA AVF  TTS [place] 3.5h 52kg [place] 2/2.25 Heparin none RUA AVF Calcitriol 0.5 ug, Mircera 50 q 4wks next 6/28 Venofer 100 x 5 thru 7/7 Other Op labs hgb 11.2 05/09/15 tfs 10%    Problem: 1. LBKA 6/30 for gangrenous foot - stable , on  Vanc/fortaz  2. AMS - prob from IV MSO4, dc'd, better 3. Cardiac - +trop, cards signed off 4. ESRD HD TTS , 4.0 k  5. HTN/ vol - appopriate drop in body wt after leg amp, no vol excess on exam 6. Anemia - hgb9.5,>10.0 cont esa/Fe 7. Metabolic bone disease - cont vit D/ binders 8. IIDDM- per admit 9. Nutrition - renal ,carb mod Renal vitamin 10. Dispo - to CIR when medically stable  Garrett Haber, PA-C [facility] [phone] 05/19/2015,10:18 AM  LOS: 5 days   Labs: Basic Metabolic Panel:  Recent Labs Lab 05/17/15 0345 05/18/15 0419 05/19/15 0350  NA 139 140 139  K 3.8 4.3 4.0  CL 95* 97* 96*  CO2 28 27 22   GLUCOSE 85 122* 137*  BUN 25* 37* 21*  CREATININE 5.17* 7.25* 4.17*  CALCIUM 8.0* 8.5* 8.6*  PHOS  --   --  4.5   Liver Function Tests:  Recent Labs Lab 05/16/15 0449 05/19/15 0350  AST 29  --   ALT 11*  --   ALKPHOS 69  --   BILITOT 0.7  --   PROT 7.1  --   ALBUMIN 2.4* 2.6*   CBC:  Recent Labs Lab 05/14/15 1122   05/15/15 0340 05/16/15 0449 05/17/15 0345 05/18/15 0419 05/19/15 0350  WBC 14.3*  --  15.9* 16.3* 15.1* 11.6* 11.9*  NEUTROABS 12.4*  --   --  13.8*  --   --   --   HGB 10.0*  < > 10.9* 12.0* 8.4* 9.5* 10.0*  HCT 30.2*  < > 33.2* 35.9* 25.6* 29.9* 31.6*  MCV 94.4  --  94.1 93.7 97.0 95.8 96.9  PLT 160  --  182 197 254 244 286  < > = values in this interval not displayed. Cardiac Enzymes:  Recent Labs Lab 05/14/15 1122 05/14/15 1558 05/14/15 2210 05/15/15 0340  TROPONINI 1.17* 1.05* 0.86* 0.72*   CBG:  Recent Labs Lab 05/18/15 1156 05/18/15 1750 05/18/15 1805 05/18/15 2123 05/19/15 0752  GLUCAP 98 100* 107* 105* 147*    Studies/Results: No results found. Medications:   . amphetamine-dextroamphetamine  5 mg Oral Q breakfast  . aspirin EC  81 mg Oral Daily  . calcitRIOL  0.5 mcg Oral Q T,Th,Sa-HD  . cefTAZidime (FORTAZ)  IV  2 g Intravenous Q T,Th,Sa-HD  . darbepoetin (ARANESP) injection -  DIALYSIS  60 mcg Intravenous Q Thu-HD  . docusate sodium  100 mg Oral BID  . famotidine  20 mg Oral Daily  . feeding supplement (ENSURE ENLIVE)  237 mL Oral BID BM  . ferric gluconate (FERRLECIT/NULECIT) IV  62.5 mg Intravenous Q T,Th,Sa-HD  . heparin  5,000 Units Subcutaneous 3 times per day  . insulin aspart  0-9 Units Subcutaneous TID WC  . insulin glargine  10 Units Subcutaneous QHS  . lisinopril  30 mg Oral Daily  . metoCLOPramide  5 mg Oral TID AC & HS  . nicotine  21 mg Transdermal Q24H  . pantoprazole  40 mg Oral Daily  . sevelamer carbonate  3,200 mg Oral TID WC  . simvastatin  40 mg Oral q1800  . sodium chloride  3 mL Intravenous Q12H  . vancomycin  500 mg Intravenous Q T,Th,Sa-HD

## 2015-05-20 LAB — RENAL FUNCTION PANEL
Albumin: 2.6 g/dL — ABNORMAL LOW (ref 3.5–5.0)
Anion gap: 17 — ABNORMAL HIGH (ref 5–15)
BUN: 42 mg/dL — ABNORMAL HIGH (ref 6–20)
CALCIUM: 8.9 mg/dL (ref 8.9–10.3)
CHLORIDE: 93 mmol/L — AB (ref 101–111)
CO2: 26 mmol/L (ref 22–32)
Creatinine, Ser: 6.36 mg/dL — ABNORMAL HIGH (ref 0.61–1.24)
GFR calc Af Amer: 10 mL/min — ABNORMAL LOW (ref >60)
GFR calc non Af Amer: 8 mL/min — ABNORMAL LOW (ref >60)
GLUCOSE: 149 mg/dL — AB (ref 65–99)
POTASSIUM: 4.1 mmol/L (ref 3.5–5.1)
Phosphorus: 5.4 mg/dL — ABNORMAL HIGH (ref 2.5–4.6)
Sodium: 136 mmol/L (ref 135–145)

## 2015-05-20 LAB — GLUCOSE, CAPILLARY
GLUCOSE-CAPILLARY: 113 mg/dL — AB (ref 65–99)
Glucose-Capillary: 163 mg/dL — ABNORMAL HIGH (ref 65–99)
Glucose-Capillary: 124 mg/dL — ABNORMAL HIGH (ref 65–99)
Glucose-Capillary: 137 mg/dL — ABNORMAL HIGH (ref 65–99)

## 2015-05-20 LAB — CBC
HCT: 29.7 % — ABNORMAL LOW (ref 39.0–52.0)
HEMOGLOBIN: 9.7 g/dL — AB (ref 13.0–17.0)
MCH: 30.8 pg (ref 26.0–34.0)
MCHC: 32.7 g/dL (ref 30.0–36.0)
MCV: 94.3 fL (ref 78.0–100.0)
PLATELETS: 324 10*3/uL (ref 150–400)
RBC: 3.15 MIL/uL — AB (ref 4.22–5.81)
RDW: 15.9 % — ABNORMAL HIGH (ref 11.5–15.5)
WBC: 11.6 10*3/uL — AB (ref 4.0–10.5)

## 2015-05-20 LAB — VITAMIN B12: Vitamin B-12: 665 pg/mL (ref 180–914)

## 2015-05-20 LAB — AMMONIA: AMMONIA: 26 umol/L (ref 9–35)

## 2015-05-20 MED ORDER — AMLODIPINE BESYLATE 2.5 MG PO TABS
2.5000 mg | ORAL_TABLET | Freq: Every day | ORAL | Status: DC
  Administered 2015-05-20: 2.5 mg via ORAL
  Filled 2015-05-20: qty 1

## 2015-05-20 MED ORDER — LISINOPRIL 40 MG PO TABS
40.0000 mg | ORAL_TABLET | Freq: Every day | ORAL | Status: DC
  Administered 2015-05-20 – 2015-05-24 (×5): 40 mg via ORAL
  Filled 2015-05-20 (×5): qty 1

## 2015-05-20 NOTE — Progress Notes (Signed)
Manuela Schwartz of CIR collaborates with current CM that patient is too confused today to participate in inpatient Rehab, plans to follow-up another day.  CM is available for questions, assistance as needed.  Will continue to monitor.

## 2015-05-20 NOTE — Progress Notes (Signed)
Physical Therapy Treatment Patient Details Name: Garrett Salas MRN: [id number] DOB: 19-Aug-1951 Today's Date: 05/20/2015    History of Present Illness Garrett Salas is a 64 y.o. male with history of ESRD on hemodialysis TTS, peripheral vascular disease, right BKA, diabetes.  Patient admitted 05/14/15 with generalized weakness, subjective fevers, left foot pain and gangrene. In ED, found to have elevated troponin 1.17 with EKG changes. Denies any chest pain. 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    PT Comments    Pt not very participative and has to be constantly cued to redirect.  Follow Up Recommendations  CIR;Supervision/Assistance - 24 hour     Equipment Recommendations  Other (comment)    Recommendations for Other Services Rehab consult     Precautions / Restrictions Precautions Precautions: Fall Precaution Comments: B BKA    Mobility  Bed Mobility Overal bed mobility: Needs Assistance;+ 2 for safety/equipment Bed Mobility: Supine to Sit     Supine to sit: +2 for physical assistance;Max assist     General bed mobility comments: verbal and tactile cues for guidance and to keep on task.  Transfers Overall transfer level: Needs assistance   Transfers: Stand Pivot Transfers Sit to Stand: Max assist Stand pivot transfers: Max assist;+2 safety/equipment       General transfer comment: pt gave very little effort throughout.  With prosthesis on, hoped pt would try to assist with w/bearing more, but he did not.  Ambulation/Gait                 Stairs            Wheelchair Mobility    Modified Rankin (Stroke Patients Only)       Balance Overall balance assessment: Needs assistance Sitting-balance support: Single extremity supported;Bilateral upper extremity supported Sitting balance-Leahy Scale: Poor Sitting balance - Comments: continuously listing R down onto his elbow     Standing balance-Leahy Scale: Poor                      Cognition  Arousal/Alertness: Lethargic Behavior During Therapy: Flat affect Overall Cognitive Status: Impaired/Different from baseline Area of Impairment: Orientation;Attention;Following commands;Awareness;Problem solving Orientation Level: Disoriented to;Place;Time;Situation Current Attention Level: Focused   Following Commands: Follows one step commands inconsistently     Problem Solving: Slow processing;Difficulty sequencing;Requires verbal cues;Requires tactile cues      Exercises      General Comments        Pertinent Vitals/Pain Pain Assessment: Faces Faces Pain Scale: Hurts little more Pain Location: vague  Pain Descriptors / Indicators: Grimacing Pain Intervention(s): Monitored during session    Home Living                      Prior Function            PT Goals (current goals can now be found in the care plan section) Acute Rehab PT Goals Patient Stated Goal: Patient unable to state PT Goal Formulation: Patient unable to participate in goal setting Time For Goal Achievement: 05/24/15 Potential to Achieve Goals: Good Progress towards PT goals: Progressing toward goals    Frequency  Min 3X/week    PT Plan Current plan remains appropriate    Co-evaluation             End of Session   Activity Tolerance: Patient limited by lethargy Patient left: in bed;with call bell/phone within reach;with bed alarm set     Time: [id number] PT Time Calculation (min) (ACUTE  ONLY): 16 min  Charges:  $Therapeutic Activity: 8-22 mins                    G Codes:      Lielle Vandervort, Tessie Fass 05/20/2015, 12:56 PM 05/20/2015  Donnella Sham, [place] [phone]  (pager)

## 2015-05-20 NOTE — Progress Notes (Signed)
CM collaborates with Marliss Czar, patient is confused to participate in CIR, the first priority is CIR at this time but if patient is not able to meet with the criteria, SNF may be the alternative.  SW is already arranging alternate plan to choose a SNF.  CM is readily available for questions and assistance as needed.

## 2015-05-20 NOTE — Progress Notes (Signed)
Inpatient Rehabilitation  I met briefly with the patient at the bedside to discuss possibility of IP Rehab admission.  Pt appears quite confused and unable to participate in meaningful DC disposition discussion.  I have attempted to reach his wife, however she did not answer and call did not roll over to voice mail.  I will leave rehab literature in pt's room with a note for wife/family to call me.  Do not believe pt. is ready for IP rehab today due to his level of confusion.  I will follow up tomorrow.  Please call if questions.  I have updated  Kendell Bane, SW and [place], [place].  [facility] Admissions Coordinator Cell [phone] Office [phone]

## 2015-05-20 NOTE — Progress Notes (Addendum)
Per pt wife, pt behavior abnormal. Pt behavior abnormal from this am, pt becoming irritable and yelling at wife. MD made aware. No new orders received. Will continue to monitor.  Ruben Reason, RN

## 2015-05-20 NOTE — Progress Notes (Signed)
[facility] PROGRESS NOTE  Garrett Salas [id number] DOB: 08-27-1951 DOA: 05/14/2015 PCP: [name], MD  Assessment/Plan: #1 wet gangrene of the left foot with critical limb ischemia Patient has been seen in consultation by vascular surgery patient noted to have some tenderness to palpation consistent with ascending cellulitis of his foot. It is felt per vascular surgery the patient's left foot is not salvageable. 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  Cultures negative. Continue empiric Antibiotics. Patient's pain regimen has been adjusted per vascular surgery. Patient noted to be confused. Discontinued IV morphine. Continue Tylenol # 3 as needed. DC Norco. Continue IV Fortaz and IV vancomycin. Curb-sided ID and recommended continue IV antibiotics during hemodialysis for 2 more weeks postoperative. Continue oxycodone as needed. Vascular surgery following.  #2 acute encephalopathy Likely secondary to problem #1 and now likely narcotic induced as patient is pleasantly confused. Patient's mentation has improved significantly today. Continue to limit narcotic medications as much as possible. Discontinued IV morphine sulfate and norco. Continue Tylenol # 3 as needed. Continue empiric antibiotics at this time.  #3 elevated troponin and EKG changes Patient has been seen in consultation by cardiology who patient likely had a demand ischemia secondary to his acute infection. Wet gangrene of the left foot with cellulitis.patient denies any chest pain. 2-D echo which was done early on showed a normal LV function. His felt that elevated troponin I EKG changes likely secondary to acute infection and severe hypertension. Cardiology feels no further cardiac workup is needed and to manage blood pressure.   #4 end-stage renal disease on hemodialysis Per nephrology.  #5 hypertensive urgency  Improvement with blood pressure post hemodialysis. Increase lisinopril 40 mg daily. Add low-dose  Norvasc.  #6 diabetes mellitus with neuropathy HgbA1C = 6.2. CBGs 124-163. Continue Lantus 10 units daily. Continue sliding scale insulin. Continue Reglan.  #7 generalized weakness Likely secondary to problem #1. PT/OT.  #8 postoperative anemia Patient with no overt bleeding. Hemoglobin is stabilized. Anemia panel consistent with anemia of chronic disease. Follow H&H.  #9 prophylaxis Heparin for DVT prophylaxis.   Code Status: full Family Communication: updated patient.  Disposition Plan: CIR when bed available hopefully tomorrow.   Consultants:  Vascular surgery: Dr. Trula Slade 05/14/2015  Nephrology: Dr. Jonnie Finner 05/14/2015  Cardiology: Dr. Martinique 05/14/2015  Procedures:  MRI left foot 05/14/2015  Chest x-ray 05/14/2015  Left BKA 05/16/15  Antibiotics:  IV Zosyn 05/14/2015>>>> 05/18/2015  IV vancomycin 05/14/2015  IV Tressie Ellis 05/18/2015  HPI/Subjective: Patient alert. Following commands. Patient on telephone. Patient aspirin questions appropriately. Patient not confused.  Objective: Filed Vitals:   05/20/15 0600  BP: 156/60  Pulse:   Temp:   Resp:    No intake or output data in the 24 hours ending 05/20/15 1407 Filed Weights   05/18/15 1302 05/19/15 0500 05/20/15 0430  Weight: 46.6 kg (102 lb 11.8 oz) 47.9 kg (105 lb 9.6 oz) 48.105 kg (106 lb 0.8 oz)    Exam:   General:  NAD  Cardiovascular: RRR  Respiratory: CTAB  Abdomen: Soft, nontender, nondistended, positive bowel sounds.  Musculoskeletal: No clubbing or cyanosis. S/p bilateral BKA  Data Reviewed: Basic Metabolic Panel:  Recent Labs Lab 05/16/15 0449 05/17/15 0345 05/18/15 0419 05/19/15 0350 05/20/15 0415  NA 136 139 140 139 136  K 4.1 3.8 4.3 4.0 4.1  CL 94* 95* 97* 96* 93*  CO2 27 28 27 22 26   GLUCOSE 48* 85 122* 137* 149*  BUN 63* 25* 37* 21* 42*  CREATININE 8.77* 5.17* 7.25* 4.17*  6.36*  CALCIUM 8.1* 8.0* 8.5* 8.6* 8.9  MG 2.6*  --   --   --   --   PHOS  --   --   --   4.5 5.4*   Liver Function Tests:  Recent Labs Lab 05/16/15 0449 05/19/15 0350 05/20/15 0415  AST 29  --   --   ALT 11*  --   --   ALKPHOS 69  --   --   BILITOT 0.7  --   --   PROT 7.1  --   --   ALBUMIN 2.4* 2.6* 2.6*   No results for input(s): LIPASE, AMYLASE in the last 168 hours. No results for input(s): AMMONIA in the last 168 hours. CBC:  Recent Labs Lab 05/14/15 1122  05/16/15 0449 05/17/15 0345 05/18/15 0419 05/19/15 0350 05/20/15 0415  WBC 14.3*  < > 16.3* 15.1* 11.6* 11.9* 11.6*  NEUTROABS 12.4*  --  13.8*  --   --   --   --   HGB 10.0*  < > 12.0* 8.4* 9.5* 10.0* 9.7*  HCT 30.2*  < > 35.9* 25.6* 29.9* 31.6* 29.7*  MCV 94.4  < > 93.7 97.0 95.8 96.9 94.3  PLT 160  < > 197 254 244 286 324  < > = values in this interval not displayed. Cardiac Enzymes:  Recent Labs Lab 05/14/15 1122 05/14/15 1558 05/14/15 2210 05/15/15 0340  TROPONINI 1.17* 1.05* 0.86* 0.72*   BNP (last 3 results) No results for input(s): BNP in the last 8760 hours.  ProBNP (last 3 results) No results for input(s): PROBNP in the last 8760 hours.  CBG:  Recent Labs Lab 05/19/15 1152 05/19/15 1643 05/19/15 2054 05/20/15 0735 05/20/15 1108  GLUCAP 139* 151* 152* 163* 124*    Recent Results (from the past 240 hour(s))  Culture, blood (routine x 2)     Status: None   Collection Time: 05/14/15 11:22 AM  Result Value Ref Range Status   Specimen Description BLOOD LEFT ANTECUBITAL  Final   Special Requests BOTTLES DRAWN AEROBIC ONLY 3CC  Final   Culture NO GROWTH 5 DAYS  Final   Report Status 05/19/2015 FINAL  Final  Culture, blood (routine x 2)     Status: None   Collection Time: 05/14/15  3:36 PM  Result Value Ref Range Status   Specimen Description BLOOD LEFT ARM  Final   Special Requests BOTTLES DRAWN AEROBIC AND ANAEROBIC 5CC  Final   Culture NO GROWTH 5 DAYS  Final   Report Status 05/19/2015 FINAL  Final  Surgical pcr screen     Status: None   Collection Time: 05/15/15   8:06 PM  Result Value Ref Range Status   MRSA, PCR NEGATIVE NEGATIVE Final   Staphylococcus aureus NEGATIVE NEGATIVE Final    Comment:        The Xpert SA Assay (FDA approved for NASAL specimens in patients over 30 years of age), is one component of a comprehensive surveillance program.  Test performance has been validated by [facility] for patients greater than or equal to 65 year old. It is not intended to diagnose infection nor to guide or monitor treatment.      Studies: No results found.  Scheduled Meds: . amphetamine-dextroamphetamine  5 mg Oral Q breakfast  . aspirin EC  81 mg Oral Daily  . calcitRIOL  0.5 mcg Oral Q T,Th,Sa-HD  . cefTAZidime (FORTAZ)  IV  2 g Intravenous Q T,Th,Sa-HD  . darbepoetin (ARANESP) injection - DIALYSIS  60 mcg Intravenous Q Thu-HD  . docusate sodium  100 mg Oral BID  . famotidine  20 mg Oral Daily  . feeding supplement (ENSURE ENLIVE)  237 mL Oral BID BM  . ferric gluconate (FERRLECIT/NULECIT) IV  62.5 mg Intravenous Q T,Th,Sa-HD  . heparin  5,000 Units Subcutaneous 3 times per day  . insulin aspart  0-9 Units Subcutaneous TID WC  . insulin glargine  10 Units Subcutaneous QHS  . lisinopril  40 mg Oral Daily  . metoCLOPramide  5 mg Oral TID AC & HS  . nicotine  21 mg Transdermal Q24H  . pantoprazole  40 mg Oral Daily  . sevelamer carbonate  3,200 mg Oral TID WC  . simvastatin  40 mg Oral q1800  . sodium chloride  3 mL Intravenous Q12H  . vancomycin  500 mg Intravenous Q T,Th,Sa-HD   Continuous Infusions:   Principal Problem:   Gangrene of foot Active Problems:   Critical lower limb ischemia   Hypertension   ESRD on dialysis   HLD (hyperlipidemia)   DM (diabetes mellitus) type II controlled peripheral vascular disorder   PAD (peripheral artery disease)   Generalized weakness   Hypertensive heart disease   Gangrene of lower extremity   ESRD on hemodialysis   Hypertensive urgency   Acute encephalopathy   Elevated troponin    Protein-calorie malnutrition, severe   Postoperative anemia due to acute blood loss    Time spent: 13 minutes    Zayvien Canning M.D. [facility] Pager [phone]. If 7PM-7AM, please contact night-coverage at [email or web address], password [facility] 05/20/2015, 2:07 PM  LOS: 6 days

## 2015-05-20 NOTE — Progress Notes (Signed)
[facility] Rounding Note:  Subjective:   Hard for me to tell if he is confused or not - he is quite slow to answer some questions, then perks up Clearly remembers me from the outpt HD unit Denies pain   Objective Vital signs in last 24 hours: Filed Vitals:   05/19/15 1828 05/19/15 2002 05/20/15 0430 05/20/15 0600  BP: 155/44 168/58 199/67 156/60  Pulse:  97 87   Temp:  98 F (36.7 C) 98.4 F (36.9 C)   TempSrc:  Oral Oral   Resp:  18 18   Height:      Weight:   48.105 kg (106 lb 0.8 oz)   SpO2:  97% 95%    Weight change: 1.505 kg (3 lb 5.1 oz)  Physical Exam: Cachectic AAM NAD Lying on his right side Heart: Regular S1S2 No S3 , no murmur , rub, or gallop Lungs: Lungs clear posteriorly  Abdomen: soft NT, ND  Extremities: bilat BKA, no edema.  Access: pos. Bruit R UA AVF   Labs:   Recent Labs Lab 05/18/15 0419 05/19/15 0350 05/20/15 0415  NA 140 139 136  K 4.3 4.0 4.1  CL 97* 96* 93*  CO2 27 22 26   GLUCOSE 122* 137* 149*  BUN 37* 21* 42*  CREATININE 7.25* 4.17* 6.36*  CALCIUM 8.5* 8.6* 8.9  PHOS  --  4.5 5.4*     Recent Labs Lab 05/16/15 0449 05/19/15 0350 05/20/15 0415  AST 29  --   --   ALT 11*  --   --   ALKPHOS 69  --   --   BILITOT 0.7  --   --   PROT 7.1  --   --   ALBUMIN 2.4* 2.6* 2.6*     Recent Labs Lab 05/14/15 1122  05/16/15 0449 05/17/15 0345 05/18/15 0419 05/19/15 0350 05/20/15 0415  WBC 14.3*  < > 16.3* 15.1* 11.6* 11.9* 11.6*  NEUTROABS 12.4*  --  13.8*  --   --   --   --   HGB 10.0*  < > 12.0* 8.4* 9.5* 10.0* 9.7*  HCT 30.2*  < > 35.9* 25.6* 29.9* 31.6* 29.7*  MCV 94.4  < > 93.7 97.0 95.8 96.9 94.3  PLT 160  < > 197 254 244 286 324  < > = values in this interval not displayed.    Recent Labs Lab 05/14/15 1122 05/14/15 1558 05/14/15 2210 05/15/15 0340  TROPONINI 1.17* 1.05* 0.86* 0.72*   CBG:  Recent Labs Lab 05/19/15 0752 05/19/15 1152 05/19/15 1643 05/19/15 2054 05/20/15 0735   GLUCAP 147* 139* 151* 152* 163*    Studies/Results: No results found. Medications:   . amphetamine-dextroamphetamine  5 mg Oral Q breakfast  . aspirin EC  81 mg Oral Daily  . calcitRIOL  0.5 mcg Oral Q T,Th,Sa-HD  . cefTAZidime (FORTAZ)  IV  2 g Intravenous Q T,Th,Sa-HD  . darbepoetin (ARANESP) injection - DIALYSIS  60 mcg Intravenous Q Thu-HD  . docusate sodium  100 mg Oral BID  . famotidine  20 mg Oral Daily  . feeding supplement (ENSURE ENLIVE)  237 mL Oral BID BM  . ferric gluconate (FERRLECIT/NULECIT) IV  62.5 mg Intravenous Q T,Th,Sa-HD  . heparin  5,000 Units Subcutaneous 3 times per day  . insulin aspart  0-9 Units Subcutaneous TID WC  . insulin glargine  10 Units Subcutaneous QHS  . lisinopril  40 mg Oral Daily  . metoCLOPramide  5 mg Oral TID AC &  HS  . nicotine  21 mg Transdermal Q24H  . pantoprazole  40 mg Oral Daily  . sevelamer carbonate  3,200 mg Oral TID WC  . simvastatin  40 mg Oral q1800  . sodium chloride  3 mL Intravenous Q12H  . vancomycin  500 mg Intravenous Q T,Th,Sa-HD    TTS [place] 3.5h 52kg Bath 2/2.25 Heparin none RUA AVF Calcitriol 0.5 ug, Mircera 50 q 4wks next 6/28 Venofer 100 x 5 thru 7/7 Other Op labs hgb 11.2 05/09/15 tfs 10%    Problem: 1. LBKA 6/30 for gangrenous foot - stable , on  Vanc/fortaz  2. AMS - attributed to narcotics. Improving. 3. Cardiac - +trop, cards signed off. Felt demand ischemia 4. ESRD HD TTS , 4.0 k  5. HTN/ vol - appopriate drop in body wt after leg amp, no vol excess on exam 6. Anemia - hgb9.5,>10.0 cont esa/Fe (darbe for mircera while inpt) 7. Metabolic bone disease - cont vit D/ binders 8. IDDM- per admit 9. Nutrition - renal ,carb mod Renal vitamin 10. Dispo - to CIR when medically stable  Jamal Maes, MD [place] Pager 05/20/2015, 11:15 AM

## 2015-05-20 NOTE — Care Management Note (Signed)
Case Management Note  Patient Details  Name: Garrett Salas MRN: [id number] Date of Birth: 1951/06/28  Subjective/Objective:          Presented with left foot gangrene ischemic toes         Action/Plan:CIR  Expected Discharge Date:                  Expected Discharge Plan:  Skilled Nursing Facility  In-House Referral:  Clinical Social Work  Discharge planning Services  CM Consult  Post Acute Care Choice:    Choice offered to:     DME Arranged:    DME Agency:     HH Arranged:    [place] Agency:     Status of Service:  In process, will continue to follow  Medicare Important Message Given:  Yes-second notification given Date Medicare IM Given:    Medicare IM give by:    Date Additional Medicare IM Given:    Additional Medicare Important Message give by:     If discussed at [place] of Stay Meetings, dates discussed:    Additional Comments:  Dimas Aguas, RN 05/20/2015, 12:14 PM

## 2015-05-21 ENCOUNTER — Inpatient Hospital Stay (HOSPITAL_COMMUNITY): Payer: Medicare Other

## 2015-05-21 LAB — RENAL FUNCTION PANEL
Albumin: 2.6 g/dL — ABNORMAL LOW (ref 3.5–5.0)
Anion gap: 18 — ABNORMAL HIGH (ref 5–15)
BUN: 65 mg/dL — ABNORMAL HIGH (ref 6–20)
CO2: 25 mmol/L (ref 22–32)
Calcium: 8.9 mg/dL (ref 8.9–10.3)
Chloride: 95 mmol/L — ABNORMAL LOW (ref 101–111)
Creatinine, Ser: 8.33 mg/dL — ABNORMAL HIGH (ref 0.61–1.24)
GFR, EST AFRICAN AMERICAN: 7 mL/min — AB (ref >60)
GFR, EST NON AFRICAN AMERICAN: 6 mL/min — AB (ref >60)
GLUCOSE: 180 mg/dL — AB (ref 65–99)
Phosphorus: 5.4 mg/dL — ABNORMAL HIGH (ref 2.5–4.6)
Potassium: 4.4 mmol/L (ref 3.5–5.1)
Sodium: 138 mmol/L (ref 135–145)

## 2015-05-21 LAB — CBC
HEMATOCRIT: 28.6 % — AB (ref 39.0–52.0)
HEMOGLOBIN: 9.3 g/dL — AB (ref 13.0–17.0)
MCH: 30.4 pg (ref 26.0–34.0)
MCHC: 32.5 g/dL (ref 30.0–36.0)
MCV: 93.5 fL (ref 78.0–100.0)
Platelets: 357 10*3/uL (ref 150–400)
RBC: 3.06 MIL/uL — ABNORMAL LOW (ref 4.22–5.81)
RDW: 15.9 % — ABNORMAL HIGH (ref 11.5–15.5)
WBC: 11.8 10*3/uL — ABNORMAL HIGH (ref 4.0–10.5)

## 2015-05-21 LAB — GLUCOSE, CAPILLARY
GLUCOSE-CAPILLARY: 169 mg/dL — AB (ref 65–99)
GLUCOSE-CAPILLARY: 76 mg/dL (ref 65–99)
GLUCOSE-CAPILLARY: 100 mg/dL — AB (ref 65–99)
Glucose-Capillary: 107 mg/dL — ABNORMAL HIGH (ref 65–99)

## 2015-05-21 LAB — RPR: RPR Ser Ql: NONREACTIVE

## 2015-05-21 LAB — VANCOMYCIN, TROUGH: Vancomycin Tr: 13 ug/mL (ref 10.0–20.0)

## 2015-05-21 MED ORDER — ATORVASTATIN CALCIUM 20 MG PO TABS
20.0000 mg | ORAL_TABLET | Freq: Every day | ORAL | Status: DC
  Administered 2015-05-21 – 2015-05-24 (×4): 20 mg via ORAL
  Filled 2015-05-21 (×4): qty 1

## 2015-05-21 MED ORDER — AMLODIPINE BESYLATE 5 MG PO TABS
5.0000 mg | ORAL_TABLET | Freq: Every day | ORAL | Status: DC
  Administered 2015-05-21 – 2015-05-22 (×2): 5 mg via ORAL
  Filled 2015-05-21 (×2): qty 1

## 2015-05-21 MED ORDER — ACETAMINOPHEN 325 MG PO TABS
ORAL_TABLET | ORAL | Status: AC
  Administered 2015-05-21: 16:00:00
  Filled 2015-05-21: qty 2

## 2015-05-21 MED ORDER — VANCOMYCIN HCL IN DEXTROSE 750-5 MG/150ML-% IV SOLN
750.0000 mg | INTRAVENOUS | Status: DC
  Administered 2015-05-21 – 2015-05-23 (×2): 750 mg via INTRAVENOUS
  Filled 2015-05-21 (×4): qty 150

## 2015-05-21 NOTE — Procedures (Signed)
I have personally attended this patient's dialysis session.   K 4.4 2K bath Minimal weight to pull. R AVF 400 Pt is intermittently confused, restless BS 100  BP stable  May need to terminate treatment early if pt remains this restless CT neg for any acute findings  Jamal Maes, MD [place] Pager 05/21/2015, 4:25 PM

## 2015-05-21 NOTE — Care Management (Signed)
Important Message  Patient Details  Name: Garrett Salas MRN: [id number] Date of Birth: 1951/10/22   Medicare Important Message Given:  Yes-third notification given    Loann Quill 05/21/2015, 10:51 AM

## 2015-05-21 NOTE — Progress Notes (Signed)
Occupational Therapy Treatment Patient Details Name: Kairan Khim MRN: [id number] DOB: 07-24-1951 Today's Date: 05/21/2015    History of present illness Zyquez Scarfone is a 64 y.o. male with history of ESRD on hemodialysis TTS, peripheral vascular disease, right BKA, diabetes.  Patient admitted 05/14/15 with generalized weakness, subjective fevers, left foot pain and gangrene. In ED, found to have elevated troponin 1.17 with EKG changes. Denies any chest pain. 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   OT comments  Pt noted to have difficulty with processing commands and sequencing/problem solving for basic ADL tasks this am. Pt also noted to be restless trying to move around in bed frequently. Recommend SNF for d/c due to pt's decreased ability to follow commands.  Pt for CT scan this am. Will follow.   Follow Up Recommendations  SNF;Supervision/Assistance - 24 hour    Equipment Recommendations  Other (comment) (defer next venue)    Recommendations for Other Services      Precautions / Restrictions Precautions Precautions: Fall Precaution Comments: B BKA Restrictions Weight Bearing Restrictions: No       Mobility Bed Mobility Overal bed mobility: Needs Assistance Bed Mobility: Supine to Sit;Sit to Supine     Supine to sit: Max assist Sit to supine: Mod assist   General bed mobility comments: pt with decreased ability to focus on task. assist to bring trunk upright to sit and also lower shoulders back down to bed to transfer back to supine.   Transfers             Balance Overall balance assessment: Needs assistance   Sitting balance-Leahy Scale: Poor Sitting balance - Comments: requires UEs for support.                           ADL Overall ADL's : Needs assistance/impaired     Grooming: Moderate assistance;Sitting                                 General ADL Comments: Pt appearing restless when OT entered the room with L stump hanging over  EOB. Assisted pt to repostiion back in bed in supine with HOB raised. Pt is awake but noted to be confused and having difficulty with problem solving and processing commands. Pt stated his birth month and date with increased time but unable to state year of his birth. Pt wanting to apply lotion to LEs so set up pt with lotion container and pt squeezing bottle slightly and trying to put lotion on but no lotion actually squeezed out of bottom. Pt attempted to apply lotion X 2 but still no lotion actually squeezed out. Assisted pt to squeeze lotion out and once lotion in hand, pt starting to stare off and didnt complete task until cued. Pt very slow to process and inconsistent with following commands. Pt again trying to sit up on EOB so assisted pt to fully transfer to EOB to sit X 2 minutes. Returned to supine as pt requesting to brush his teeth but had to obtain toothpaste from out of room. Pt dropping toothpaste lid and having difficulty problem solving sequence of applying toothpaste, etc. Pt for CT per nursing to evaluate confusion.       Vision                 Additional Comments: pt noted to close one eye when trying to apply toothpaste. Pt stating  he did have double vision when OT asked but later in session stated he did not have double.    Perception     Praxis      Cognition   Behavior During Therapy: WFL for tasks assessed/performed Overall Cognitive Status: Impaired/Different from baseline Area of Impairment: Orientation;Following commands;Safety/judgement;Problem solving;Awareness Orientation Level: Person;Time Current Attention Level: Focused    Following Commands: Follows one step commands inconsistently     Problem Solving: Slow processing;Decreased initiation;Difficulty sequencing;Requires verbal cues;Requires tactile cues General Comments: Pt knows who is president and his location, did not know current year nor month, wasn't able to state month with cue that yesterday  was Independence day, at times pt gazed off without a verbal response when asked a question     Extremity/Trunk Assessment               Exercises General Exercises - Lower Extremity Quad Sets: Other (comment) (attempted to instruct pt in quad set but he refused, encouraged pt to keep L knee straight)   Shoulder Instructions       General Comments      Pertinent Vitals/ Pain       Pain Assessment: 0-10 Pain Score: 8  Faces Pain Scale: Hurts even more Pain Location: L LE Pain Descriptors / Indicators: Grimacing Pain Intervention(s): Repositioned  Home Living                                          Prior Functioning/Environment              Frequency Min 2X/week     Progress Toward Goals  OT Goals(current goals can now be found in the care plan section)  Progress towards OT goals: Not progressing toward goals - comment (confusion)  Acute Rehab OT Goals Patient Stated Goal: wants to go home  Plan Discharge plan needs to be updated    Co-evaluation                 End of Session     Activity Tolerance Patient tolerated treatment well   Patient Left in bed;with call bell/phone within reach;with bed alarm set   Nurse Communication          Time: [id number] OT Time Calculation (min): 33 min  Charges: OT General Charges $OT Visit: 1 Procedure OT Treatments $Self Care/Home Management : 8-22 mins $Therapeutic Activity: 8-22 mins  Jules Schick  [id number] 05/21/2015, 12:05 PM

## 2015-05-21 NOTE — Progress Notes (Addendum)
       Left BKA healing well Incision C/D staples intact  S/P Left BKA Staples will need to be removed at 4 weeks post-op  COLLINS, EMMA MAUREEN PA-C  Addendum  I have independently interviewed and examined the patient, and I agree with the physician assistant's findings.    Adele Barthel, MD [facility] Office: [phone] Pager: [phone]  05/22/2015, 8:24 AM

## 2015-05-21 NOTE — Progress Notes (Addendum)
[facility] PROGRESS NOTE  Garrett Salas [id number] DOB: 03-10-1951 DOA: 05/14/2015 PCP: [name], MD  Assessment/Plan: #1 wet gangrene of the left foot with critical limb ischemia Patient has been seen in consultation by vascular surgery patient noted to have some tenderness to palpation consistent with ascending cellulitis of his foot. It is felt per vascular surgery the patient's left foot is not salvageable. 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  Cultures negative. Continue empiric Antibiotics. Patient's pain regimen has been adjusted per vascular surgery. Patient noted to be confused which is improving. Discontinued IV morphine. Discontinue oxycodone. Discontinued Norco. Continue Tylenol # 3 as needed. Continue IV Fortaz and IV vancomycin. Curb-sided ID and recommended continue IV antibiotics during hemodialysis for 2 more weeks postoperative through 05/30/2015. Per vascular surgery staple 72, in the office in 4 weeks. Will need to follow-up her vascular surgeon as outpatient on discharge.  #2 acute encephalopathy Likely secondary to problem #1 and now likely narcotic induced. Clinical improvement with patient's confusion. Continue to limit narcotic medications as much as possible. Discontinued IV morphine sulfate and norco. Will discontinue oxycodone. Continue Tylenol # 3 as needed. B-12 levels at 665. Ammonia level was 26. RPR is pending. RBC folate is pending. Will get a CT of the head without contrast. Continue empiric antibiotics at this time.  #3 elevated troponin and EKG changes Patient has been seen in consultation by cardiology who patient likely had a demand ischemia secondary to his acute infection. Wet gangrene of the left foot with cellulitis.patient denies any chest pain. 2-D echo which was done early on showed a normal LV function. His felt that elevated troponin I EKG changes likely secondary to acute infection and severe hypertension. Cardiology feels no further  cardiac workup is needed and to manage blood pressure.   #4 end-stage renal disease on hemodialysis Per nephrology.  #5 hypertensive urgency  Improvement with blood pressure post hemodialysis. Continue lisinopril 40 mg daily. Increase Norvasc to 5 mg daily. Titrate blood pressure as needed for better blood pressure control. Patient to have dialysis today.  #6 diabetes mellitus with neuropathy HgbA1C = 6.2. CBGs 113-169. Continue Lantus 10 units daily. Continue sliding scale insulin. Continue Reglan.  #7 generalized weakness Likely secondary to problem #1. PT/OT.  #8 postoperative anemia Patient with no overt bleeding. Hemoglobin is stabilized. Anemia panel consistent with anemia of chronic disease. Follow H&H.  #9 prophylaxis Heparin for DVT prophylaxis.   Code Status: full Family Communication: updated patient.  Disposition Plan: CIR vs SNF when bed available.   Consultants:  Vascular surgery: Dr. Trula Slade 05/14/2015  Nephrology: Dr. Jonnie Finner 05/14/2015  Cardiology: Dr. Martinique 05/14/2015  Procedures:  MRI left foot 05/14/2015  Chest x-ray 05/14/2015  Left BKA 05/16/15  Antibiotics:  IV Zosyn 05/14/2015>>>> 05/18/2015  IV vancomycin 05/14/2015  IV Tressie Ellis 05/18/2015  HPI/Subjective: Patient alert. Following commands. Patient with no complaints. Patient alert to self and place.  Objective: Filed Vitals:   05/21/15 0641  BP: 209/73  Pulse:   Temp:   Resp:     Intake/Output Summary (Last 24 hours) at 05/21/15 1000 Last data filed at 05/21/15 0945  Gross per 24 hour  Intake    240 ml  Output      0 ml  Net    240 ml   Filed Weights   05/19/15 0500 05/20/15 0430 05/21/15 0500  Weight: 47.9 kg (105 lb 9.6 oz) 48.105 kg (106 lb 0.8 oz) 46.5 kg (102 lb 8.2 oz)    Exam:  General:  NAD  Cardiovascular: RRR  Respiratory: CTAB  Abdomen: Soft, nontender, nondistended, positive bowel sounds.  Musculoskeletal: No clubbing or cyanosis. S/p bilateral  BKA  Data Reviewed: Basic Metabolic Panel:  Recent Labs Lab 05/16/15 0449 05/17/15 0345 05/18/15 0419 05/19/15 0350 05/20/15 0415 05/21/15 0505  NA 136 139 140 139 136 138  K 4.1 3.8 4.3 4.0 4.1 4.4  CL 94* 95* 97* 96* 93* 95*  CO2 27 28 27 22 26 25   GLUCOSE 48* 85 122* 137* 149* 180*  BUN 63* 25* 37* 21* 42* 65*  CREATININE 8.77* 5.17* 7.25* 4.17* 6.36* 8.33*  CALCIUM 8.1* 8.0* 8.5* 8.6* 8.9 8.9  MG 2.6*  --   --   --   --   --   PHOS  --   --   --  4.5 5.4* 5.4*   Liver Function Tests:  Recent Labs Lab 05/16/15 0449 05/19/15 0350 05/20/15 0415 05/21/15 0505  AST 29  --   --   --   ALT 11*  --   --   --   ALKPHOS 69  --   --   --   BILITOT 0.7  --   --   --   PROT 7.1  --   --   --   ALBUMIN 2.4* 2.6* 2.6* 2.6*   No results for input(s): LIPASE, AMYLASE in the last 168 hours.  Recent Labs Lab 05/20/15 1945  AMMONIA 26   CBC:  Recent Labs Lab 05/14/15 1122  05/16/15 0449 05/17/15 0345 05/18/15 0419 05/19/15 0350 05/20/15 0415  WBC 14.3*  < > 16.3* 15.1* 11.6* 11.9* 11.6*  NEUTROABS 12.4*  --  13.8*  --   --   --   --   HGB 10.0*  < > 12.0* 8.4* 9.5* 10.0* 9.7*  HCT 30.2*  < > 35.9* 25.6* 29.9* 31.6* 29.7*  MCV 94.4  < > 93.7 97.0 95.8 96.9 94.3  PLT 160  < > 197 254 244 286 324  < > = values in this interval not displayed. Cardiac Enzymes:  Recent Labs Lab 05/14/15 1122 05/14/15 1558 05/14/15 2210 05/15/15 0340  TROPONINI 1.17* 1.05* 0.86* 0.72*   BNP (last 3 results) No results for input(s): BNP in the last 8760 hours.  ProBNP (last 3 results) No results for input(s): PROBNP in the last 8760 hours.  CBG:  Recent Labs Lab 05/20/15 0735 05/20/15 1108 05/20/15 1617 05/20/15 2118 05/21/15 0737  GLUCAP 163* 124* 137* 113* 169*    Recent Results (from the past 240 hour(s))  Culture, blood (routine x 2)     Status: None   Collection Time: 05/14/15 11:22 AM  Result Value Ref Range Status   Specimen Description BLOOD LEFT  ANTECUBITAL  Final   Special Requests BOTTLES DRAWN AEROBIC ONLY 3CC  Final   Culture NO GROWTH 5 DAYS  Final   Report Status 05/19/2015 FINAL  Final  Culture, blood (routine x 2)     Status: None   Collection Time: 05/14/15  3:36 PM  Result Value Ref Range Status   Specimen Description BLOOD LEFT ARM  Final   Special Requests BOTTLES DRAWN AEROBIC AND ANAEROBIC 5CC  Final   Culture NO GROWTH 5 DAYS  Final   Report Status 05/19/2015 FINAL  Final  Surgical pcr screen     Status: None   Collection Time: 05/15/15  8:06 PM  Result Value Ref Range Status   MRSA, PCR NEGATIVE NEGATIVE Final   Staphylococcus aureus  NEGATIVE NEGATIVE Final    Comment:        The Xpert SA Assay (FDA approved for NASAL specimens in patients over 31 years of age), is one component of a comprehensive surveillance program.  Test performance has been validated by [facility] for patients greater than or equal to 46 year old. It is not intended to diagnose infection nor to guide or monitor treatment.      Studies: No results found.  Scheduled Meds: . amLODipine  5 mg Oral Daily  . amphetamine-dextroamphetamine  5 mg Oral Q breakfast  . aspirin EC  81 mg Oral Daily  . calcitRIOL  0.5 mcg Oral Q T,Th,Sa-HD  . cefTAZidime (FORTAZ)  IV  2 g Intravenous Q T,Th,Sa-HD  . darbepoetin (ARANESP) injection - DIALYSIS  60 mcg Intravenous Q Thu-HD  . docusate sodium  100 mg Oral BID  . famotidine  20 mg Oral Daily  . feeding supplement (ENSURE ENLIVE)  237 mL Oral BID BM  . ferric gluconate (FERRLECIT/NULECIT) IV  62.5 mg Intravenous Q T,Th,Sa-HD  . heparin  5,000 Units Subcutaneous 3 times per day  . insulin aspart  0-9 Units Subcutaneous TID WC  . insulin glargine  10 Units Subcutaneous QHS  . lisinopril  40 mg Oral Daily  . metoCLOPramide  5 mg Oral TID AC & HS  . nicotine  21 mg Transdermal Q24H  . pantoprazole  40 mg Oral Daily  . sevelamer carbonate  3,200 mg Oral TID WC  . simvastatin  40 mg Oral  q1800  . sodium chloride  3 mL Intravenous Q12H  . vancomycin  500 mg Intravenous Q T,Th,Sa-HD   Continuous Infusions:   Principal Problem:   Gangrene of foot Active Problems:   Critical lower limb ischemia   Hypertension   ESRD on dialysis   HLD (hyperlipidemia)   DM (diabetes mellitus) type II controlled peripheral vascular disorder   PAD (peripheral artery disease)   Generalized weakness   Hypertensive heart disease   Gangrene of lower extremity   ESRD on hemodialysis   Hypertensive urgency   Acute encephalopathy   Elevated troponin   Protein-calorie malnutrition, severe   Postoperative anemia due to acute blood loss    Time spent: 61 minutes    THOMPSON,DANIEL M.D. [facility] Pager [phone]. If 7PM-7AM, please contact night-coverage at [email or web address], password [email or web address] 05/21/2015, 10:00 AM  LOS: 7 days

## 2015-05-21 NOTE — Progress Notes (Signed)
Inpatient Rehabilitation   I met with Mr. Rosenbloom at the bedside and observed his PT session.  Pt. With persistent cognitive deficits limiting his ability to participate.  Additionally, pt. Observed to fatigue quickly with limited activity tolerance for CIR program.  I believe he will fare best in SNF rehab environment for slower paced program.  I have discussed with Dr. Grandville Silos as well as RN Loree Fee, Jacqlyn Krauss, CM and Kendell Bane, SW.  I am unable to reach his wife who is currently at work and no work phone # available. I will sign off.  Please call if questions.  [place] Admissions Coordinator Cell [phone] Office [phone]

## 2015-05-21 NOTE — Progress Notes (Signed)
[facility] Rounding Note:  Subjective:   Still seems confused to me - more alert - but having some issues expressing himself - and apologized for "confusing me with somebody else" but can't be more specific Does say he worked with PT (which appearrs to be correct) Clearly this is not his outpt baseline   Objective Vital signs in last 24 hours: Filed Vitals:   05/20/15 2100 05/21/15 0500 05/21/15 0641 05/21/15 1056  BP: 176/49 207/71 209/73 141/73  Pulse: 86 88    Temp: 98.5 F (36.9 C) 97.5 F (36.4 C)    TempSrc: Oral Oral    Resp:      Height:      Weight:  46.5 kg (102 lb 8.2 oz)    SpO2: 100% 100%     Weight change: -1.605 kg (-3 lb 8.6 oz)  Physical Exam: Cachectic AAM NAD Lying on his right side Pleasant, awake but all conversation not making sense Heart: Regular S1S2 No S3 , no murmur , rub, or gallop Lungs: Lungs clear posteriorly  Abdomen: soft NT, ND  Extremities: bilat BKA, no edema. New left BKA staples intact, incision line clean and dry   Access: pos. Bruit R UA AVF   Labs:   Recent Labs Lab 05/19/15 0350 05/20/15 0415 05/21/15 0505  NA 139 136 138  K 4.0 4.1 4.4  CL 96* 93* 95*  CO2 22 26 25   GLUCOSE 137* 149* 180*  BUN 21* 42* 65*  CREATININE 4.17* 6.36* 8.33*  CALCIUM 8.6* 8.9 8.9  PHOS 4.5 5.4* 5.4*     Recent Labs Lab 05/16/15 0449 05/19/15 0350 05/20/15 0415 05/21/15 0505  AST 29  --   --   --   ALT 11*  --   --   --   ALKPHOS 69  --   --   --   BILITOT 0.7  --   --   --   PROT 7.1  --   --   --   ALBUMIN 2.4* 2.6* 2.6* 2.6*     Recent Labs Lab 05/14/15 1122  05/16/15 0449 05/17/15 0345 05/18/15 0419 05/19/15 0350 05/20/15 0415  WBC 14.3*  < > 16.3* 15.1* 11.6* 11.9* 11.6*  NEUTROABS 12.4*  --  13.8*  --   --   --   --   HGB 10.0*  < > 12.0* 8.4* 9.5* 10.0* 9.7*  HCT 30.2*  < > 35.9* 25.6* 29.9* 31.6* 29.7*  MCV 94.4  < > 93.7 97.0 95.8 96.9 94.3  PLT 160  < > 197 254 244 286 324  < > = values  in this interval not displayed.    Recent Labs Lab 05/14/15 1122 05/14/15 1558 05/14/15 2210 05/15/15 0340  TROPONINI 1.17* 1.05* 0.86* 0.72*   CBG:  Recent Labs Lab 05/20/15 0735 05/20/15 1108 05/20/15 1617 05/20/15 2118 05/21/15 0737  GLUCAP 163* 124* 137* 113* 169*    Studies/Results: No results found. Medications:   . amLODipine  5 mg Oral Daily  . amphetamine-dextroamphetamine  5 mg Oral Q breakfast  . aspirin EC  81 mg Oral Daily  . calcitRIOL  0.5 mcg Oral Q T,Th,Sa-HD  . cefTAZidime (FORTAZ)  IV  2 g Intravenous Q T,Th,Sa-HD  . darbepoetin (ARANESP) injection - DIALYSIS  60 mcg Intravenous Q Thu-HD  . docusate sodium  100 mg Oral BID  . famotidine  20 mg Oral Daily  . feeding supplement (ENSURE ENLIVE)  237 mL Oral BID BM  . ferric  gluconate (FERRLECIT/NULECIT) IV  62.5 mg Intravenous Q T,Th,Sa-HD  . heparin  5,000 Units Subcutaneous 3 times per day  . insulin aspart  0-9 Units Subcutaneous TID WC  . insulin glargine  10 Units Subcutaneous QHS  . lisinopril  40 mg Oral Daily  . metoCLOPramide  5 mg Oral TID AC & HS  . nicotine  21 mg Transdermal Q24H  . pantoprazole  40 mg Oral Daily  . sevelamer carbonate  3,200 mg Oral TID WC  . simvastatin  40 mg Oral q1800  . sodium chloride  3 mL Intravenous Q12H  . vancomycin  500 mg Intravenous Q T,Th,Sa-HD    TTS [place] 3.5h 52kg [place] 2/2.25 Heparin none RUA AVF Calcitriol 0.5 ug, Mircera 50 q 4wks next 6/28 Venofer 100 x 5 thru 7/7 Other Op labs hgb 11.2 05/09/15 tfs 10%    Problem: 1. LBKA 6/30 for gangrenous foot - on  Vanc/fortaz with stop date to be  05/30/15 2. AMS - attributed to narcotics but is still not at baseline despite minimizing them. I note plans for CT brain. Agree.  3. Cardiac - +trop, cards signed off. Felt demand ischemia. Last ECHO was 12/2014 normal LVF at that time. 4. ESRD HD TTS  For HD today. 5. HTN/ vol - appopriate drop in body wt after leg amp, no vol excess on  exam 6. Anemia - Cont esa/Fe (darbe for mircera while inpt) 7. Metabolic bone disease - cont vit D/ binders 8. IDDM- per admit 9. Nutrition - renal ,carb mod Renal vitamin 10. Dispo - is felt will need SNF for rehab due to debility, cognitive defects  Garrett Maes, MD [place] [phone] Pager 05/21/2015, 11:03 AM

## 2015-05-21 NOTE — Progress Notes (Addendum)
ANTIBIOTIC CONSULT NOTE   Pharmacy Consult for vancomycin and fortaz Indication: left foot gangrene  No Known Allergies  Patient Measurements: Height: 5\' 4"  (162.6 cm) Weight: 102 lb 8.2 oz (46.5 kg) IBW/kg (Calculated) : 59.2   Vital Signs: Temp: 97.5 F (36.4 C) (07/05 0500) Temp Source: Oral (07/05 0500) BP: 209/73 mmHg (07/05 0641) Pulse Rate: 88 (07/05 0500) Intake/Output from previous day:   Intake/Output from this shift:    Labs:  Recent Labs  05/19/15 0350 05/20/15 0415 05/21/15 0505  WBC 11.9* 11.6*  --   HGB 10.0* 9.7*  --   PLT 286 324  --   CREATININE 4.17* 6.36* 8.33*   Estimated Creatinine Clearance: 6 mL/min (by C-G formula based on Cr of 8.33). No results for input(s): VANCOTROUGH, VANCOPEAK, VANCORANDOM, GENTTROUGH, GENTPEAK, GENTRANDOM, TOBRATROUGH, TOBRAPEAK, TOBRARND, AMIKACINPEAK, AMIKACINTROU, AMIKACIN in the last 72 hours.   Microbiology: Recent Results (from the past 720 hour(s))  Culture, blood (routine x 2)     Status: None   Collection Time: 05/14/15 11:22 AM  Result Value Ref Range Status   Specimen Description BLOOD LEFT ANTECUBITAL  Final   Special Requests BOTTLES DRAWN AEROBIC ONLY 3CC  Final   Culture NO GROWTH 5 DAYS  Final   Report Status 05/19/2015 FINAL  Final  Culture, blood (routine x 2)     Status: None   Collection Time: 05/14/15  3:36 PM  Result Value Ref Range Status   Specimen Description BLOOD LEFT ARM  Final   Special Requests BOTTLES DRAWN AEROBIC AND ANAEROBIC 5CC  Final   Culture NO GROWTH 5 DAYS  Final   Report Status 05/19/2015 FINAL  Final  Surgical pcr screen     Status: None   Collection Time: 05/15/15  8:06 PM  Result Value Ref Range Status   MRSA, PCR NEGATIVE NEGATIVE Final   Staphylococcus aureus NEGATIVE NEGATIVE Final    Comment:        The Xpert SA Assay (FDA approved for NASAL specimens in patients over 44 years of age), is one component of a comprehensive surveillance program.  Test  performance has been validated by [facility] for patients greater than or equal to 56 year old. It is not intended to diagnose infection nor to guide or monitor treatment.     Medical History: Past Medical History  Diagnosis Date  . Diabetes mellitus     Type 2. Diagnosed in mid 67s. Currently insulin requiring  . Hyperlipidemia   . ESRD (end stage renal disease) on dialysis     due to diabetic nephropathy. T/Th/Sat  . Tobacco abuse   . Diabetic neuropathy   . Diabetic retinopathy     legally blind  . Hypertension   . Dialysis patient   . Peripheral vascular disease   . Hx of right BKA    Assessment: 64 yo male left foot gangrene on vancomycin and fortaz. Patient s/p L BKA 6/30. No fevers noted, wbc = 11.6, end-stage renal disease on HD TThSa - with next session today. Noted plans for 2 weeks of antibiotics postoperative (end date should be ~ 05/30/15)  Zosyn 6/28>>7/2 Vanc 6/28>> 7/2 fortaz>>  6/28 bld x2 - neg  Goal of Therapy:  pre-HD vanc level 15-25 mcg/mL  Plan: -Continue fortaz 2gm with HD - vancomycin 500mg  iv qHD (TThSa) -Vancomycin level today prior to HD -Will follow clinical progress   Hildred Laser, Pharm D 05/21/2015 8:57 AM   Addendum -pre HD vanc level= 13  Plan -Will  change vancomycin to 750mg  IV TTS with HD  Hildred Laser, Pharm D 05/21/2015 1:42 PM

## 2015-05-21 NOTE — Progress Notes (Signed)
Physical Therapy Treatment Patient Details Name: Garrett Salas MRN: [id number] DOB: 1951/05/01 Today's Date: 05/21/2015    History of Present Illness Garrett Salas is a 64 y.o. male with history of ESRD on hemodialysis TTS, peripheral vascular disease, right BKA, diabetes.  Patient admitted 05/14/15 with generalized weakness, subjective fevers, left foot pain and gangrene. In ED, found to have elevated troponin 1.17 with EKG changes. Denies any chest pain. 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    PT Comments    Pt has confusion which was not present one week ago. Today he was unable to state year or month. He knows his location and is oriented to self. He frequently gazed off into the distance without responding when asked a question. Frequent verbal/manual cues required to refocus pt on task. +2 Total assist for bed to recliner transfer. Pt refused my attempt at assisting him with LLE exercises. SNF recommended.    Follow Up Recommendations  Supervision/Assistance - 24 hour;SNF     Equipment Recommendations  None recommended by PT    Recommendations for Other Services Rehab consult     Precautions / Restrictions Precautions Precautions: Fall Precaution Comments: B BKA Restrictions Weight Bearing Restrictions: No    Mobility  Bed Mobility Overal bed mobility: Needs Assistance Bed Mobility: Supine to Sit     Supine to sit: Max assist     General bed mobility comments: verbal and tactile cues for guidance and to keep on task.  Transfers Overall transfer level: Needs assistance Equipment used: None Transfers: Stand Pivot Transfers Sit to Stand: +2 physical assistance Stand pivot transfers: +2 safety/equipment;+2 physical assistance;Total assist       General transfer comment: pt gave very little effort throughout.  With prosthesis on, hoped pt would try to assist with w/bearing more, but he did not, pt 20%. Assist to rise, steady, and pivot on R prosthesis.  Ambulation/Gait             General Gait Details: unable   Stairs            Wheelchair Mobility    Modified Rankin (Stroke Patients Only)       Balance Overall balance assessment: Needs assistance   Sitting balance-Leahy Scale: Poor Sitting balance - Comments: leans in multiple directions, requires BUE support for static sitting     Standing balance-Leahy Scale: Zero                      Cognition Arousal/Alertness: Awake/alert Behavior During Therapy: WFL for tasks assessed/performed Overall Cognitive Status: Impaired/Different from baseline Area of Impairment: Orientation;Following commands;Problem solving;Memory;Awareness Orientation Level: Time Current Attention Level: Focused   Following Commands: Follows one step commands inconsistently     Problem Solving: Slow processing;Difficulty sequencing;Requires verbal cues;Requires tactile cues General Comments: Pt knows who is president and his location, did not know current year nor month, wasn't able to state month with cue that yesterday was Independence day, at times pt gazed off without a verbal response when asked a question     Exercises General Exercises - Lower Extremity Quad Sets: Other (comment) (attempted to instruct pt in quad set but he refused, encouraged pt to keep L knee straight)    General Comments        Pertinent Vitals/Pain Faces Pain Scale: Hurts even more Pain Location: LLE incision with movement, could not give numerical rating when asked Pain Descriptors / Indicators: Grimacing Pain Intervention(s): Premedicated before session (with Tylenol, no narcotics due to confusion)  Home Living                      Prior Function            PT Goals (current goals can now be found in the care plan section) Acute Rehab PT Goals Patient Stated Goal: wants to go home PT Goal Formulation: With patient Time For Goal Achievement: 05/24/15 Potential to Achieve Goals: Good Progress  towards PT goals: Not progressing toward goals - comment (limited by confusion)    Frequency  Min 3X/week    PT Plan Current plan remains appropriate    Co-evaluation             End of Session Equipment Utilized During Treatment: Gait belt Activity Tolerance: Other (comment) (limited by confusion) Patient left: in bed;with call bell/phone within reach;in chair;with chair alarm set     Time: 1005-1030 PT Time Calculation (min) (ACUTE ONLY): 25 min  Charges:  $Therapeutic Activity: 23-37 mins                    G Codes:      Philomena Doheny 05/21/2015, 11:03 AM [phone]

## 2015-05-22 ENCOUNTER — Telehealth: Payer: Self-pay | Admitting: Vascular Surgery

## 2015-05-22 LAB — CBC
HEMATOCRIT: 29.4 % — AB (ref 39.0–52.0)
HEMOGLOBIN: 9.3 g/dL — AB (ref 13.0–17.0)
MCH: 30.1 pg (ref 26.0–34.0)
MCHC: 31.6 g/dL (ref 30.0–36.0)
MCV: 95.1 fL (ref 78.0–100.0)
Platelets: 310 10*3/uL (ref 150–400)
RBC: 3.09 MIL/uL — ABNORMAL LOW (ref 4.22–5.81)
RDW: 16.1 % — ABNORMAL HIGH (ref 11.5–15.5)
WBC: 11.6 10*3/uL — ABNORMAL HIGH (ref 4.0–10.5)

## 2015-05-22 LAB — RENAL FUNCTION PANEL
Albumin: 2.5 g/dL — ABNORMAL LOW (ref 3.5–5.0)
Anion gap: 15 (ref 5–15)
BUN: 22 mg/dL — AB (ref 6–20)
CO2: 28 mmol/L (ref 22–32)
Calcium: 8.3 mg/dL — ABNORMAL LOW (ref 8.9–10.3)
Chloride: 96 mmol/L — ABNORMAL LOW (ref 101–111)
Creatinine, Ser: 4.77 mg/dL — ABNORMAL HIGH (ref 0.61–1.24)
GFR calc Af Amer: 14 mL/min — ABNORMAL LOW (ref >60)
GFR, EST NON AFRICAN AMERICAN: 12 mL/min — AB (ref >60)
Glucose, Bld: 136 mg/dL — ABNORMAL HIGH (ref 65–99)
POTASSIUM: 3.9 mmol/L (ref 3.5–5.1)
Phosphorus: 3.1 mg/dL (ref 2.5–4.6)
SODIUM: 139 mmol/L (ref 135–145)

## 2015-05-22 LAB — GLUCOSE, CAPILLARY
GLUCOSE-CAPILLARY: 129 mg/dL — AB (ref 65–99)
GLUCOSE-CAPILLARY: 80 mg/dL (ref 65–99)
Glucose-Capillary: 131 mg/dL — ABNORMAL HIGH (ref 65–99)
Glucose-Capillary: 97 mg/dL (ref 65–99)

## 2015-05-22 LAB — FOLATE RBC
FOLATE, RBC: 1227 ng/mL (ref >498)
Folate, Hemolysate: 362.1 ng/mL
Hematocrit: 29.5 % — ABNORMAL LOW (ref 37.5–51.0)

## 2015-05-22 MED ORDER — HYDRALAZINE HCL 25 MG PO TABS
25.0000 mg | ORAL_TABLET | Freq: Four times a day (QID) | ORAL | Status: DC
  Administered 2015-05-22 – 2015-05-24 (×7): 25 mg via ORAL
  Filled 2015-05-22 (×8): qty 1

## 2015-05-22 MED ORDER — AMPHETAMINE-DEXTROAMPHETAMINE 5 MG PO TABS: 2.5000 mg | ORAL_TABLET | Freq: Every day | ORAL | Status: DC

## 2015-05-22 MED ORDER — AMLODIPINE BESYLATE 10 MG PO TABS
10.0000 mg | ORAL_TABLET | Freq: Every day | ORAL | Status: DC
  Administered 2015-05-23 – 2015-05-24 (×2): 10 mg via ORAL
  Filled 2015-05-22 (×2): qty 1

## 2015-05-22 MED ORDER — AMPHETAMINE-DEXTROAMPHETAMINE 10 MG PO TABS
5.0000 mg | ORAL_TABLET | Freq: Every day | ORAL | Status: DC
  Administered 2015-05-23 – 2015-05-24 (×2): 5 mg via ORAL
  Filled 2015-05-22 (×2): qty 1

## 2015-05-22 NOTE — Progress Notes (Addendum)
Summary of events:  Per discussion with Dr. Verlon Au and RNCM- Jacqlyn Krauss- d/c to SNF is anticipated for tomorrow.  CSW contacted patient's wife and discussed bed offers. The SNF's that she had hoped to accept patient do not have vacancies at this time.  She agrees to accept a bed at [facility] and this facility has offered a bed. Patient was screened by Gerald Stabs- Admissions Director who stated that he would have the admissions staff contact Mrs. Kleinert about getting admission paperwork signed.  Patient continues to require dialysis and is scheduled to receive a treatment tomorrow prior to d/c.  He was on a Tuesday, Thursday, Saturday schedule at home- and receives dialysis at the [facility] [place] facility. [phone]  CSW spoke to Velma at this facility and notified her of patient's anticipated d/c tomorrow to the SNF and that per the SNF- patient would have to be on a M-W-F schedule. She stated that patient would be changed to accomodate this schedule and requested that the dialysis center be contacted tomorrow once d/c is confirmed. They will coordinate with the SNF re: schedule and anticipate that patient will need to receive a treatment on Friday. Wife works daily from 6am-2pm. She does not have a cell hone but can be reached if necessary at work or at home. CSW will follow up and assist with d/c once MD confirms d/c. Patient is aware and agreeable to SNF placement; he appears to be more oriented today.  Lorie Phenix. Pauline Good, [place]

## 2015-05-22 NOTE — Progress Notes (Signed)
Pt refuses to have his PIV restarted. Nurse is at the bedside and will discuss the patient's request with the physician Catalina Pizza

## 2015-05-22 NOTE — Progress Notes (Signed)
Active bed search in place for SNF bed as CIR will not be able to accept patient. Fl2 updated and sent to [facility]. CSW attempted yesterday to reach patient's wife without success to provide update.  Patient is nearing stability for d/c- ?today.  Wife does not have a cell phone and works in the morning from [place] pm.  Butch Penny T. Pauline Good, [place]

## 2015-05-22 NOTE — Progress Notes (Signed)
Spoke with Dr. Verlon Au, it is okay for pt to not have IV access.

## 2015-05-22 NOTE — Progress Notes (Signed)
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  Cultures negative. Continue empiric Antibiotics.  Patient's pain regimen has been adjusted per vascular surgery. Continue IV Fortaz and IV vancomycin. Curb-sided ID and recommended continue IV antibiotics during hemodialysis for 2 more weeks postoperative through 05/30/2015. Per vascular surgery staple 72, in the office in 4 weeks. Will need to follow-up her vascular surgeon as outpatient on discharge.  #2 acute toxic metabolic encephalopathy  Patient noted to be confused which is improving. Discontinued IV morphine. Discontinue oxycodone. Discontinued Norco. Continue Tylenol # 3 as needed. Patient also noted to be on Reglan by mouth and home dose of Adderall.   Discontinued Reglan completely on 7/6, cut back dose of Adderall from 5-2.5 mg daily   Likely secondary to problem #1 and now likely narcotic induced. Clinical improvement  with patient's confusion. Continue to limit narcotic medications as much as possible.  Discontinued IV morphine sulfate and norco.   Continue Tylenol # 3 as needed. B-12 levels at 665.  Ammonia level was 26. RPR is pending. RBC folate is pending.   CT head without contrast = no microvascular issues Continue empiric antibiotics at this time.  #3 elevated troponin and EKG changes  Seen in consultation by cardiology who patient likely had a demand ischemia secondary to his acute infection.   Wet gangrene of the left foot with cellulitis.patient denies any chest pain.   2-D echo which was done early on showed a normal LV function.   His felt that elevated troponin I EKG changes likely secondary to acute infection and severe hypertension.   Cardiology feels no further cardiac workup is needed and to manage blood pressure.   #4 end-stage renal disease on hemodialysis & secondary hyperparathyroidism and anemia of renal disease  Per nephrology.  Supposedly to get IV iron tomorrow  It appears that patient's dates for dialysis may need to be changed. Case manager made aware  #5 hypertensive urgency   Improvement with blood pressure post hemodialysis.   Continue lisinopril 40 mg daily. Increase Norvasc to 5 mg daily-->10 mg daily.   Titrate blood pressure as needed for better blood pressure control.   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 CBGs 113-169. Continue Lantus 10 units daily. Continue sliding scale insulin.  I did discontinue Continue Reglan On 7/6 in an effort to determine if patient's mental status was related to this  If needed we will start erythromycin for motility.  #7 generalized weakness Likely secondary to problem #1. PT/OT. Will  need skilled care.  #8 postoperative anemia Patient with no overt bleeding. Hemoglobin is stabilized. Anemia panel consistent with anemia of chronic disease. Follow H&H.  #9  prophylaxis Heparin for DVT prophylaxis.   Code Status: full Family Communication: updated patient.  Disposition Plan: CIR vs SNF when bed available.   Consultants:  Vascular surgery: Dr. Trula Slade 05/14/2015  Nephrology: Dr. Jonnie Finner 05/14/2015  Cardiology: Dr. Martinique 05/14/2015  Procedures:  MRI left foot 05/14/2015  Chest x-ray 05/14/2015  Left BKA 05/16/15  Antibiotics:  IV Zosyn 05/14/2015>>>> 05/18/2015  IV vancomycin 05/14/2015  IV Tressie Ellis 05/18/2015  HPI/Subjective:  Confused to some extent nose that date but does not know the month and year [place] season  Not in severe pain President-"barack Obama"   Objective: Filed Vitals:   05/22/15 0835  BP: 181/75  Pulse: 90  Temp:   Resp:     Intake/Output Summary (Last 24 hours) at 05/22/15 1019 Last data filed at 05/21/15 2223  Gross per 24 hour  Intake    240 ml  Output     -7 ml  Net    247 ml   Filed Weights   05/21/15 0500 05/21/15 1413 05/22/15 0521  Weight: 46.5 kg (102 lb 8.2 oz) 46.6 kg (102 lb 11.8 oz) 46.902 kg (103 lb 6.4 oz)    Exam:   General:  NAD  Cardiovascular: RRR  Respiratory: CTAB  Abdomen: Soft, nontender, nondistended, positive bowel sounds.  Musculoskeletal: No clubbing or cyanosis. S/p bilateral BKA-clean wound on the L  Data Reviewed: Basic Metabolic Panel:  Recent Labs Lab 05/16/15 0449  05/18/15 0419 05/19/15 0350 05/20/15 0415 05/21/15 0505 05/22/15 0435  NA 136  < > 140 139 136 138 139  K 4.1  < > 4.3 4.0 4.1 4.4 3.9  CL 94*  < > 97* 96* 93* 95* 96*  CO2 27  < > 27 22 26 25 28   GLUCOSE 48*  < > 122* 137* 149* 180* 136*  BUN 63*  < > 37* 21* 42* 65* 22*  CREATININE 8.77*  < > 7.25* 4.17* 6.36* 8.33* 4.77*  CALCIUM 8.1*  < > 8.5* 8.6* 8.9 8.9 8.3*  MG 2.6*  --   --   --   --   --   --   PHOS  --   --   --  4.5 5.4* 5.4* 3.1  < > = values in this interval not displayed. Liver Function Tests:  Recent Labs Lab 05/16/15 0449 05/19/15 0350  05/20/15 0415 05/21/15 0505 05/22/15 0435  AST 29  --   --   --   --   ALT 11*  --   --   --   --   ALKPHOS 69  --   --   --   --   BILITOT 0.7  --   --   --   --   PROT 7.1  --   --   --   --   ALBUMIN 2.4* 2.6* 2.6* 2.6* 2.5*   No results for input(s): LIPASE, AMYLASE in the last 168 hours.  Recent Labs Lab 05/20/15 1945  AMMONIA 26   CBC:  Recent Labs Lab 05/16/15 0449  05/18/15 0419 05/19/15 0350 05/20/15 0415 05/21/15 1459 05/22/15 0435  WBC 16.3*  < > 11.6* 11.9* 11.6* 11.8* 11.6*  NEUTROABS 13.8*  --   --   --   --   --   --   HGB 12.0*  < > 9.5* 10.0* 9.7*  9.3* 9.3*  HCT 35.9*  < > 29.9* 31.6* 29.7* 28.6* 29.4*  MCV 93.7  < > 95.8 96.9 94.3 93.5 95.1  PLT 197  < > 244 286 324 357 310  < > = values in this interval not displayed. Cardiac Enzymes: No results for input(s): CKTOTAL, CKMB, CKMBINDEX, TROPONINI in the last 168 hours. BNP (last 3 results) No results for input(s): BNP in the last 8760 hours.  ProBNP (last 3 results) No results for input(s): PROBNP in the last 8760 hours.  CBG:  Recent Labs Lab 05/21/15 0737 05/21/15 1135 05/21/15 1535 05/21/15 2215 05/22/15 0720  GLUCAP 169* 76 100* 107* 129*    Recent Results (from the past 240 hour(s))  Culture, blood (routine x 2)     Status: None   Collection Time: 05/14/15 11:22 AM  Result Value Ref Range Status   Specimen Description BLOOD LEFT ANTECUBITAL  Final   Special Requests BOTTLES DRAWN AEROBIC ONLY 3CC  Final   Culture NO GROWTH 5 DAYS  Final   Report Status 05/19/2015 FINAL  Final  Culture, blood (routine x 2)     Status: None   Collection Time: 05/14/15  3:36 PM  Result Value Ref Range Status   Specimen Description BLOOD LEFT ARM  Final   Special Requests BOTTLES DRAWN AEROBIC AND ANAEROBIC 5CC  Final   Culture NO GROWTH 5 DAYS  Final   Report Status 05/19/2015 FINAL  Final  Surgical pcr screen     Status: None   Collection Time: 05/15/15  8:06 PM  Result Value Ref Range Status    MRSA, PCR NEGATIVE NEGATIVE Final   Staphylococcus aureus NEGATIVE NEGATIVE Final    Comment:        The Xpert SA Assay (FDA approved for NASAL specimens in patients over 69 years of age), is one component of a comprehensive surveillance program.  Test performance has been validated by [facility] for patients greater than or equal to 77 year old. It is not intended to diagnose infection nor to guide or monitor treatment.      Studies: Ct Head Wo Contrast  05/21/2015   CLINICAL DATA:  Probe altered mental status and fever today.  EXAM: CT HEAD WITHOUT CONTRAST  TECHNIQUE: Contiguous axial images were obtained from the base of the skull through the vertex without intravenous contrast.  COMPARISON:  Head CT scan 05/04/2008.  FINDINGS: Hypoattenuation in the subcortical and periventricular deep white matter is identified. No evidence of acute abnormality including hemorrhage, infarct, mass lesion, mass effect, midline shift or abnormal extra-axial fluid collection is identified. Cavum septum pellucidum is noted. Carotid atherosclerosis is seen.  IMPRESSION: No acute abnormality.  Chronic microvascular ischemic change.  Atherosclerosis.   Electronically Signed   By: Inge Rise M.D.   On: 05/21/2015 12:45    Scheduled Meds: . amLODipine  5 mg Oral Daily  . amphetamine-dextroamphetamine  5 mg Oral Q breakfast  . aspirin EC  81 mg Oral Daily  . atorvastatin  20 mg Oral q1800  . calcitRIOL  0.5 mcg Oral Q T,Th,Sa-HD  . cefTAZidime (FORTAZ)  IV  2 g Intravenous Q T,Th,Sa-HD  . darbepoetin (ARANESP) injection - DIALYSIS  60 mcg Intravenous Q Thu-HD  . docusate sodium  100 mg Oral BID  . famotidine  20 mg Oral Daily  . feeding supplement (ENSURE ENLIVE)  237 mL Oral BID BM  . ferric gluconate (FERRLECIT/NULECIT) IV  62.5 mg Intravenous Q T,Th,Sa-HD  . heparin  5,000 Units  Subcutaneous 3 times per day  . insulin aspart  0-9 Units Subcutaneous TID WC  . insulin glargine  10 Units  Subcutaneous QHS  . lisinopril  40 mg Oral Daily  . metoCLOPramide  5 mg Oral TID AC & HS  . nicotine  21 mg Transdermal Q24H  . pantoprazole  40 mg Oral Daily  . sevelamer carbonate  3,200 mg Oral TID WC  . sodium chloride  3 mL Intravenous Q12H  . vancomycin  750 mg Intravenous Q T,Th,Sa-HD   Continuous Infusions:   Principal Problem:   Gangrene of foot Active Problems:   Hypertension   ESRD on dialysis   HLD (hyperlipidemia)   DM (diabetes mellitus) type II controlled peripheral vascular disorder   PAD (peripheral artery disease)   Generalized weakness   Hypertensive heart disease   Gangrene of lower extremity   ESRD on hemodialysis   Hypertensive urgency   Acute encephalopathy   Elevated troponin   Protein-calorie malnutrition, severe   Critical lower limb ischemia   Postoperative anemia due to acute blood loss    Time spent: 35 minutes  Verneita Griffes, MD [facility] (P) [phone]

## 2015-05-22 NOTE — Telephone Encounter (Addendum)
-----   Message from Mena Goes, RN sent at 05/16/2015  5:23 PM EDT ----- Regarding: Schedule   ----- Message -----    From: Conrad Blanchard, MD    Sent: 05/16/2015   2:31 PM      To: [facility] [address] [id number] Dec 10, 1950   PROCEDURE: left below-the-knee amputation  Asst: Silva Bandy, [facility]   Follow-up: 4 weeks   notified patient of post op appt. on 06-21-15 at 8:30 am

## 2015-05-22 NOTE — Progress Notes (Signed)
[facility] Rounding Note:  Subjective:   Seems MUCH more like himself today Awake, alert, trying to eat a little breakfast   Objective Vital signs in last 24 hours: Filed Vitals:   05/22/15 0256 05/22/15 0521 05/22/15 0635 05/22/15 0835  BP: 157/56 201/82 194/74 181/75  Pulse:  87  90  Temp:  98.6 F (37 C)    TempSrc:  Oral    Resp:  16    Height:      Weight:  46.902 kg (103 lb 6.4 oz)    SpO2:  98%     Weight change: 0.1 kg (3.5 oz)  Physical Exam: Cachectic AAM NAD sitting up in the bed Pleasant, awake and oriented at this time Heart: Regular S1S2 No S3 , no murmur , rub, or gallop Lungs: Lungs clear posteriorly  Abdomen: soft NT, ND  Extremities: bilat BKA, no edema.  Left BKA staples intact, incision line clean and dry   Access: + Bruit R UA AVF   Labs:   Recent Labs Lab 05/20/15 0415 05/21/15 0505 05/22/15 0435  NA 136 138 139  K 4.1 4.4 3.9  CL 93* 95* 96*  CO2 26 25 28   GLUCOSE 149* 180* 136*  BUN 42* 65* 22*  CREATININE 6.36* 8.33* 4.77*  CALCIUM 8.9 8.9 8.3*  PHOS 5.4* 5.4* 3.1     Recent Labs Lab 05/16/15 0449  05/20/15 0415 05/21/15 0505 05/22/15 0435  AST 29  --   --   --   --   ALT 11*  --   --   --   --   ALKPHOS 69  --   --   --   --   BILITOT 0.7  --   --   --   --   PROT 7.1  --   --   --   --   ALBUMIN 2.4*  < > 2.6* 2.6* 2.5*  < > = values in this interval not displayed.   Recent Labs Lab 05/16/15 0449  05/18/15 0419 05/19/15 0350 05/20/15 0415 05/21/15 1459 05/22/15 0435  WBC 16.3*  < > 11.6* 11.9* 11.6* 11.8* 11.6*  NEUTROABS 13.8*  --   --   --   --   --   --   HGB 12.0*  < > 9.5* 10.0* 9.7* 9.3* 9.3*  HCT 35.9*  < > 29.9* 31.6* 29.7* 28.6* 29.4*  MCV 93.7  < > 95.8 96.9 94.3 93.5 95.1  PLT 197  < > 244 286 324 357 310  < > = values in this interval not displayed.  CBG:  Recent Labs Lab 05/21/15 0737 05/21/15 1135 05/21/15 1535 05/21/15 2215 05/22/15 0720  GLUCAP 169* 76 100* 107*  129*    Studies/Results: Ct Head Wo Contrast  05/21/2015   CLINICAL DATA:  Probe altered mental status and fever today.  EXAM: CT HEAD WITHOUT CONTRAST  TECHNIQUE: Contiguous axial images were obtained from the base of the skull through the vertex without intravenous contrast.  COMPARISON:  Head CT scan 05/04/2008.  FINDINGS: Hypoattenuation in the subcortical and periventricular deep white matter is identified. No evidence of acute abnormality including hemorrhage, infarct, mass lesion, mass effect, midline shift or abnormal extra-axial fluid collection is identified. Cavum septum pellucidum is noted. Carotid atherosclerosis is seen.  IMPRESSION: No acute abnormality.  Chronic microvascular ischemic change.  Atherosclerosis.   Electronically Signed   By: Inge Rise M.D.   On: 05/21/2015 12:45   Medications:   . amLODipine  5 mg Oral Daily  . amphetamine-dextroamphetamine  5 mg Oral Q breakfast  . aspirin EC  81 mg Oral Daily  . atorvastatin  20 mg Oral q1800  . calcitRIOL  0.5 mcg Oral Q T,Th,Sa-HD  . cefTAZidime (FORTAZ)  IV  2 g Intravenous Q T,Th,Sa-HD  . darbepoetin (ARANESP) injection - DIALYSIS  60 mcg Intravenous Q Thu-HD  . docusate sodium  100 mg Oral BID  . famotidine  20 mg Oral Daily  . feeding supplement (ENSURE ENLIVE)  237 mL Oral BID BM  . ferric gluconate (FERRLECIT/NULECIT) IV  62.5 mg Intravenous Q T,Th,Sa-HD  . heparin  5,000 Units Subcutaneous 3 times per day  . insulin aspart  0-9 Units Subcutaneous TID WC  . insulin glargine  10 Units Subcutaneous QHS  . lisinopril  40 mg Oral Daily  . metoCLOPramide  5 mg Oral TID AC & HS  . nicotine  21 mg Transdermal Q24H  . pantoprazole  40 mg Oral Daily  . sevelamer carbonate  3,200 mg Oral TID WC  . sodium chloride  3 mL Intravenous Q12H  . vancomycin  750 mg Intravenous Q T,Th,Sa-HD    TTS [place] 3.5h  52kg will have new lower EDW at discharge [place] 2/2.25 Heparin none RUA AVF Calcitriol 0.5 ug, Mircera  50 q 4wks next 6/28 Venofer 100 x 5 thru 7/7 Other Op labs hgb 11.2 05/09/15 tfs 10%    Problem: 1. LBKA 6/30 for gangrenous foot - on  Vanc/fortaz with stop date to be  05/30/15 2. AMS - attributed to narcotics and as of yesterday nowhere close to baseline although looks QUITE a bit better today. CT neg for anything acute - just a lot of deep white matter ds.  3. Cardiac - +trop, cards signed off. Felt demand ischemia. Last ECHO was 12/2014 normal LVF at that time. 4. ESRD HD TTS. 5. HTN/ vol - appopriate drop in body wt after leg amp, no vol excess on exam (has been taking in very little) 6. Anemia - Cont esa/Fe (darbe for mircera while inpt). Last dose of iron load is tomorrow 7. Metabolic bone disease - cont vit D/ binders 8. IDDM- per admit 9. Nutrition - renal ,carb mod Renal vitamin 10. Dispo - is felt will need SNF for rehab due to debility, cognitive defects (although will be interested in how PT sees him today given the increased level of alertness) If he goes to a SNF in [place] his HD days will have to be changed to MWF (I am told by the HD unit there)  Jamal Maes, MD [place] Pager 05/22/2015, 9:48 AM

## 2015-05-22 NOTE — Progress Notes (Signed)
Nutrition Follow-up  DOCUMENTATION CODES:  Severe malnutrition in context of chronic illness  INTERVENTION:   Continue Ensure Enlive po BID, each supplement provides 350 kcal and 20 grams of protein  NUTRITION DIAGNOSIS:  Malnutrition related to chronic illness as evidenced by percent weight loss, severe depletion of body fat, severe depletion of muscle mass, ongoing  GOAL:  Patient will meet greater than or equal to 90% of their needs, progressing  MONITOR:  PO intake, Weight trends, Supplement acceptance, Labs, I & O's  ASSESSMENT: 64 y.o. Male with ESRD (hemodialysis T/T/S), peripheral vascular disease status post right BKA, diabetes mellitus with neuropathy and retinopathy who presents to the emergency department with weakness and abnormal behavior.   Pt s/p procedure 6/30: LEFT BELOW-THE-KNEE AMPUTATION  Pt continues on a Renal/Carbohydrate Modified diet.  PO intake variable at 20-75% per flowsheet records.  Malnutrition ongoing.  Nutrient needs increased given chronic illness, post-op state.  Receiving Ensure Enlive oral nutrition supplements.  Height:  Ht Readings from Last 1 Encounters:  05/14/15 5\' 4"  (1.626 m)    Weight:  Wt Readings from Last 1 Encounters:  05/22/15 103 lb 6.4 oz (46.902 kg)    Ideal Body Weight:  55.2 kg (adjusted R BKA)  Wt Readings from Last 10 Encounters:  05/22/15 103 lb 6.4 oz (46.902 kg)  04/17/15 130 lb (58.968 kg)  02/07/15 123 lb (55.792 kg)  01/14/15 108 lb 0.4 oz (49 kg)  01/04/15 123 lb 7.3 oz (56 kg)  05/18/11 124 lb (56.246 kg)  04/27/11 125 lb (56.7 kg)  03/25/11 120 lb (54.432 kg)    BMI:  Body mass index is 17.74 kg/(m^2).  Estimated Nutritional Needs:  Kcal:  1800-2000  Protein:  75-90 grams  Fluid:  Per MD  Skin:   (R BKA, gangrene of L foot)  Diet Order:  Diet renal/carb modified with fluid restriction Diet-HS Snack?: Nothing; Room service appropriate?: Yes; Fluid consistency:: Thin  EDUCATION  NEEDS:  No education needs identified at this time   Intake/Output Summary (Last 24 hours) at 05/22/15 1421 Last data filed at 05/21/15 2223  Gross per 24 hour  Intake    240 ml  Output     -7 ml  Net    247 ml    Last BM:  6/28  Arthur Holms, RD, LDN Pager #: [phone] After-Hours Pager #: [phone]

## 2015-05-22 NOTE — Clinical Social Work Note (Signed)
wClinical Social Work Assessment  Patient Details  Name: Garrett Salas MRN: [id number] Date of Birth: 1951-06-27  Date of referral:  05/20/15               Reason for consult:  Facility Placement (CIR vs SNF)                Permission sought to share information with:  Other (Patient currently confused- discussed with pateint's wife- [place]) Permission granted to share information::  No  Name::        Agency::     Relationship::     Contact Information:     Housing/Transportation Living arrangements for the past 2 months:  Single Family Home Source of Information:  Spouse Patient Interpreter Needed:  None Criminal Activity/Legal Involvement Pertinent to Current Situation/Hospitalization:  No - Comment as needed Significant Relationships:  Spouse Lives with:   Spouse Do you feel safe going back to the place where you live?    Need for family participation in patient care:  Yes (Comment)  Care giving concerns: Patient has underwent amputation of his left leg; he is now a bilateral amputee.  He is experiencing significant levels of confusion; wife states when he had his right leg amputated he had a similar reaction while being given Morphine and Dilaudid.  His delirium improved and he returned to baseline. Wife is very concerns with current level of confusion and does not feel that anyone is providing any answers at to why this is occuring. Patient had received Morphine after surgery but this has been discontinued.   Social Worker assessment / plan:  64 year old male- now a bilateral amputee- admitted from home where he currently lives with his wife.  Patient is receiving CAP services at home while his wife works from 6 am to 2 pm at [employer].  He is a dialysis patient and is transported to dialysis in [place] on Tuesday, Thursdays and Saturdays.  Wife relates that he recently received a prosthesis for his right stump but has not been able to receive any PT to start  using it.  Physical Therapy is currently recommending CIR placement and wife is adamant that he go to CIR.  CSW spoke to her about need to initiate a SNF referral in case CIR is unable to accept him.  Bed search process discussed; she is very hesitant to agree with SNF search but finally agreed (reluctantly) to SNF search. Prefers placement in [place] - 1st choice- [facility], 2nd choice- [facility].  She declined a 3rd choice and does not want him placed at the SNF that she works in.  CSW explained need to complete full search in [facility] and she is agreeable. Fl2 will be completed and placed on chart for MD's signature.  Employment status:  Disabled (Comment on whether or not currently receiving Disability) Insurance information:  Medicare PT Recommendations:  Inpatient Rehab Consult, [place] / Referral to community resources:  [place], Other (Comment Required) (Coordination with [facility] staff)  Patient/Family's Response to care:  Patient is alert and oriented at times to person but currently quite confused and restless. CSW was unable to ascertain his response to care.  As stated above- his wife is very worried about his current level of confusion.   Patient/Family's Understanding of and Emotional Response to Diagnosis, Current Treatment, and Prognosis:  Unable to assess with patient; his wife is very involved and supportive. She states that she  is very worried about his current level of confusion and that no one can explain why it has not improved.  She was noted to be very hesitant about SNF option and verbalize her strong desire for CIR placement. "I don't really want him to have to go to a nursing home but I have to work and I can't care for him right now."   Emotional Assessment Appearance:  Appears older than stated age Attitude/Demeanor/Rapport:  Other (yelling out at times, very restless and "fussy" per wife. Not  baseline) Affect (typically observed):  Restless, Anxious, Irritable (Verbally agitated at times.) Orientation:  Oriented to Self, Fluctuating Orientation (Suspected and/or reported Sundowners) Alcohol / Substance use:  Tobacco Use (Current smoker) Psych involvement (Current and /or in the community):  No (Comment)  Discharge Needs  Concerns to be addressed:  Cognitive Concerns, Care Coordination Readmission within the last 30 days:  No Current discharge risk:  None Barriers to Discharge:  No Barriers Identified   Williemae Area, LCSW 05/20/15  4:00 pm [phone]

## 2015-05-22 NOTE — Clinical Social Work Placement (Signed)
   CLINICAL SOCIAL WORK PLACEMENT  NOTE  Date:  05/22/2015  Patient Details  Name: Garrett Salas MRN: [id number] Date of Birth: 10-30-1951  Clinical Social Work is seeking post-discharge placement for this patient at the [place] level of care (*CSW will initial, date and re-position this form in  chart as items are completed):  Yes   Patient/family provided with [place] Work Department's list of facilities offering this level of care within the geographic area requested by the patient (or if unable, by the patient's family).  Yes   Patient/family informed of their freedom to choose among providers that offer the needed level of care, that participate in Medicare, Medicaid or managed care program needed by the patient, have an available bed and are willing to accept the patient.  Yes   Patient/family informed of [place]'s ownership interest in [facility] and [facility], as well as of the fact that they are under no obligation to receive care at these facilities.  PASRR submitted to EDS on 05/22/15     PASRR number received on 05/22/15     Existing PASRR number confirmed on       FL2 transmitted to all facilities in geographic area requested by pt/family on 05/22/15     FL2 transmitted to all facilities within larger geographic area on       Patient informed that his/her managed care company has contracts with or will negotiate with certain facilities, including the following:   (NA)         Patient/family informed of bed offers received.  Patient chooses bed at       Physician recommends and patient chooses bed at      Patient to be transferred to   on  .  Patient to be transferred to facility by Ambulance      Patient family notified on   of transfer.  Name of family member notified:        PHYSICIAN Please prepare priority discharge summary, including medications, Please sign FL2, Please prepare prescriptions      Additional Comment:    _______________________________________________ Williemae Area, LCSW 05/22/2015, 8:26 AM

## 2015-05-22 NOTE — Clinical Social Work Placement (Signed)
   CLINICAL SOCIAL WORK PLACEMENT  NOTE  Date:  05/22/2015  Patient Details  Name: Garrett Salas MRN: [id number] Date of Birth: Feb 14, 1951  Clinical Social Work is seeking post-discharge placement for this patient at the [place] level of care (*CSW will initial, date and re-position this form in  chart as items are completed):  Yes   Patient/family provided with [place] Work Department's list of facilities offering this level of care within the geographic area requested by the patient (or if unable, by the patient's family).  Yes   Patient/family informed of their freedom to choose among providers that offer the needed level of care, that participate in Medicare, Medicaid or managed care program needed by the patient, have an available bed and are willing to accept the patient.  Yes   Patient/family informed of [place]'s ownership interest in [facility] and [facility], as well as of the fact that they are under no obligation to receive care at these facilities.  PASRR submitted to EDS on       PASRR number received on       Existing PASRR number confirmed on       FL2 transmitted to all facilities in geographic area requested by pt/family on       FL2 transmitted to all facilities within larger geographic area on       Patient informed that his/her managed care company has contracts with or will negotiate with certain facilities, including the following:            Patient/family informed of bed offers received.  Patient chooses bed at       Physician recommends and patient chooses bed at      Patient to be transferred to   on  .  Patient to be transferred to facility by       Patient family notified on   of transfer.  Name of family member notified:        PHYSICIAN Please prepare priority discharge summary, including medications, Please sign FL2, Please prepare prescriptions     Additional Comment:     _______________________________________________ Williemae Area, LCSW [phone]

## 2015-05-23 ENCOUNTER — Inpatient Hospital Stay (HOSPITAL_COMMUNITY): Payer: Medicare Other

## 2015-05-23 LAB — RENAL FUNCTION PANEL
ANION GAP: 15 (ref 5–15)
Albumin: 2.7 g/dL — ABNORMAL LOW (ref 3.5–5.0)
BUN: 34 mg/dL — ABNORMAL HIGH (ref 6–20)
CHLORIDE: 96 mmol/L — AB (ref 101–111)
CO2: 28 mmol/L (ref 22–32)
Calcium: 8.9 mg/dL (ref 8.9–10.3)
Creatinine, Ser: 6.48 mg/dL — ABNORMAL HIGH (ref 0.61–1.24)
GFR calc non Af Amer: 8 mL/min — ABNORMAL LOW (ref >60)
GFR, EST AFRICAN AMERICAN: 9 mL/min — AB (ref >60)
Glucose, Bld: 136 mg/dL — ABNORMAL HIGH (ref 65–99)
Phosphorus: 3.3 mg/dL (ref 2.5–4.6)
Potassium: 4.3 mmol/L (ref 3.5–5.1)
Sodium: 139 mmol/L (ref 135–145)

## 2015-05-23 LAB — GLUCOSE, CAPILLARY
Glucose-Capillary: 115 mg/dL — ABNORMAL HIGH (ref 65–99)
Glucose-Capillary: 176 mg/dL — ABNORMAL HIGH (ref 65–99)

## 2015-05-23 LAB — CBC
HEMATOCRIT: 28.5 % — AB (ref 39.0–52.0)
Hemoglobin: 9.2 g/dL — ABNORMAL LOW (ref 13.0–17.0)
MCH: 31 pg (ref 26.0–34.0)
MCHC: 32.3 g/dL (ref 30.0–36.0)
MCV: 96 fL (ref 78.0–100.0)
Platelets: 356 10*3/uL (ref 150–400)
RBC: 2.97 MIL/uL — AB (ref 4.22–5.81)
RDW: 16.1 % — AB (ref 11.5–15.5)
WBC: 13.3 10*3/uL — ABNORMAL HIGH (ref 4.0–10.5)

## 2015-05-23 MED ORDER — DARBEPOETIN ALFA 60 MCG/0.3ML IJ SOSY
PREFILLED_SYRINGE | INTRAMUSCULAR | Status: AC
  Filled 2015-05-23: qty 0.3

## 2015-05-23 NOTE — Progress Notes (Signed)
Occupational Therapy Treatment Patient Details Name: Garrett Salas MRN: [id number] DOB: 24-Jan-1951 Today's Date: 05/23/2015    History of present illness Garrett Salas is a 64 y.o. male with history of ESRD on hemodialysis TTS, peripheral vascular disease, right BKA, diabetes.  Patient admitted 05/14/15 with generalized weakness, subjective fevers, left foot pain and gangrene. In ED, found to have elevated troponin 1.17 with EKG changes. Denies any chest pain. 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   OT comments  Limited treatment session secondary to pt's cognition and confusion.  Pt perseverating on family during session and tearful at times.  Pt's wife left at beginning of session to let this therapist work with him and Mr. Debski began to yell at her about getting out anytime she could.  Throughout session he demonstrated multiple bouts of tearfulness and had difficulty maintaining his attention on questions that therapist asked him.  Most of his tearful responses were incoherent, so difficulty understanding exactly what he was saying.  Attempted to re-direct him back to the session but his attention is limited secondary to confusion as well.  Will continue to follow until discharge to SNF.   Follow Up Recommendations  SNF;Supervision/Assistance - 24 hour          Precautions / Restrictions Precautions Precautions: Fall Precaution Comments: B BKA Restrictions Weight Bearing Restrictions: No       Mobility Bed Mobility Overal bed mobility: Needs Assistance Bed Mobility: Supine to Sit;Sit to Supine     Supine to sit: Mod assist Sit to supine: Mod assist   General bed mobility comments: pt with decreased ability to focus on task. assist to bring trunk upright to sit and also lower shoulders back down to bed to transfer back to supine.   Transfers                      Balance     Sitting balance-Leahy Scale: Poor Sitting balance - Comments: When pt was sustaining attention in  short intervals.                             ADL                                         General ADL Comments: Therapist entered room and pt's wife stated she was going to leave and give [place] time alone.  Pt began to yell at his spouse about leaving but some of his speech was incoherent babble.  Therapist attempted to calm pt down and bring his attention back to the treatment session.  Pt would have verbal outbursts and crying anytime therapist would ask him a question about home setup or if he had any family or children.  Pt initially not agreeable to attempt sitting EOB but therapist finally persuaded him to while conversation and tearfulness continued.  Pt needed mod assist for transition to sittting.  He could maintain with close supervision at times but then would actively fall down onto his right elbow and forearm without warning.  Pt sat EOB for approximately 10 mins.  Therapist attempted to ask pt questions about things he liked to watch or do and everytime pt would get tearful  and perseverate on talking about his family in non-coherent statements.  He did not engage in any functional task this session as therapist could  not get hiim to actively attempt or try using prosthesis or transfering out of bed secondary to pt's cognition and unwillingness to attempt task.           Vision                            Cognition   Behavior During Therapy: Agitated Overall Cognitive Status: Impaired/Different from baseline Area of Impairment: Orientation;Following commands;Awareness;Problem solving Orientation Level: Situation Current Attention Level: Focused    Following Commands: Follows one step commands inconsistently Safety/Judgement: Decreased awareness of deficits;Decreased awareness of safety   Problem Solving: Slow processing;Difficulty sequencing;Requires tactile cues;Requires verbal cues                   Pertinent Vitals/ Pain       Pain  Assessment: Faces Faces Pain Scale: No hurt         Frequency Min 2X/week     Progress Toward Goals  OT Goals(current goals can now be found in the care plan section)  Progress towards OT goals: Not progressing toward goals - comment (decreased ability to follow commands)     Plan Discharge plan remains appropriate          Activity Tolerance Other (comment) (Session limited by cognition and active participation)   Patient Left in bed;with bed alarm set;with call bell/phone within reach           Time: [id number] OT Time Calculation (min): 28 min  Charges: OT General Charges $OT Visit: 1 Procedure OT Treatments $Self Care/Home Management : 23-37 mins  Rosary Filosa OTR/L 05/23/2015, 4:56 PM

## 2015-05-23 NOTE — Progress Notes (Signed)
Facility requesting update on pt dc plan. CSW paged MD.  Berton Mount, [place]

## 2015-05-23 NOTE — Progress Notes (Signed)
[facility] PROGRESS NOTE  Garrett Salas [id number] DOB: 1951-02-03 DOA: 05/14/2015 PCP: [name], MD    64 y/o ?, Prior critical limb ischemia 2/22-2/29/16 status post aortogram = no target = R BKA 2/24, left foot apparently also developed gangrene-noted on office visit 04/16/15 to have new blister on dorsum of the third and fourth PIP joint left left as per podiatrist  Left PT target noted as well with no adequate pain in left leg for distal bypass per vascular when seen on 04/17/15-he was offered by Dr. 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  Cultures negative. Continue empiric Antibiotics Vacn/Foratz till 05/30/15 c dialysis  Patient's pain regimen has been adjusted per vascular surgery.  .   Per vascular surgery staple 72, in the office in 4 weeks. Will need to follow-up her vascular surgeon as outpatient on discharge.  #2 acute toxic metabolic encephalopathy DDX Microvascular infarct vs Hypoglycemia vs #1  Patient noted to be confused which is improving. Discontinued IV morphine. Discontinue oxycodone. Discontinued Norco. D/c Tylenol # 3--codeine can accumulate in dialysis patients-would prefer dilaudid very low dose if prn   Patient also noted to be on Reglan by mouth and home dose of Adderall.Discontinued Reglan completely on 7/6, keep adderall at 5 mg daily   Continue to limit narcotic medications as much as possible.  Discontinued IV morphine sulfate and norco.    B-12 levels at 665. Ammonia level was 26. RPR is pending. RBC folate is pending.   CT head without contrast = no microvascular issues   Given persistent confusion feel MRI indicated which has been ordered  #3 elevated troponin and EKG changes  Seen in consultation by cardiology who patient likely had a demand ischemia secondary to his acute infection.   Wet gangrene of the left foot with cellulitis.patient denies any chest pain.   2-D echo which was done early on showed a normal LV function.   His felt that elevated troponin I EKG changes likely secondary to acute infection and severe hypertension.   Cardiology feels no further cardiac workup is needed and to manage blood pressure.   #4 end-stage renal disease on hemodialysis & secondary hyperparathyroidism and anemia of renal disease  Per nephrology.  recievedt IV iron  Patient's dates for dialysis may need to be changed. Case manager made aware--[name] to d/c to [facility] so will be in the future a M/W/F patient  #5 hypertensive urgency   Improvement with blood pressure post hemodialysis.   Continue lisinopril 40 mg daily. Increase Norvasc to 5 mg daily-->10 mg daily.   Titrate blood pressure as needed for better blood pressure control.   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 CBGs low at night and lantus 10 being held]   Continue sliding scale insulin.    I did discontinue Continue Reglan On 7/6 in an effort to determine if patient's mental status was related to this  If needed we will start erythromycin for motility.  #7 generalized weakness Likely secondary to problem #1. PT/OT. Will need  skilled care.  #8 postoperative anemia Patient with no overt bleeding. Hemoglobin is stabilized. Anemia panel consistent with anemia of chronic disease. Follow H&H.  #9 prophylaxis Heparin for DVT prophylaxis.   Code Status: full Family Communication: updated  patient.  Disposition Plan: CIR vs SNF when bed available.   Consultants:  Vascular surgery: Dr. Trula Slade 05/14/2015  Nephrology: Dr. Jonnie Finner 05/14/2015  Cardiology: Dr. Martinique 05/14/2015  Procedures:  MRI left foot 05/14/2015  Chest x-ray 05/14/2015  Left BKA 05/16/15  Antibiotics:  IV Zosyn 05/14/2015>>>> 05/18/2015  IV vancomycin 05/14/2015  IV Tressie Ellis 05/18/2015  HPI/Subjective:  "2013" "[place]" Some perseveration ROS unreliable Dialysis nurse telling me he has been pulling at dialysis catheter Other wsie is pleasant   Objective: Filed Vitals:   05/23/15 0800  BP: 174/84  Pulse: 93  Temp:   Resp:     Intake/Output Summary (Last 24 hours) at 05/23/15 0940 Last data filed at 05/22/15 2300  Gross per 24 hour  Intake    120 ml  Output      0 ml  Net    120 ml   Filed Weights   05/22/15 0521 05/23/15 0607 05/23/15 0727  Weight: 46.902 kg (103 lb 6.4 oz) 47.265 kg (104 lb 3.2 oz) 45.9 kg (101 lb 3.1 oz)    Exam:   General:  NAD  Cardiovascular: RRR  Respiratory: CTAB  Abdomen: Soft, nontender, nondistended, positive bowel sounds.  Musculoskeletal: No clubbing or cyanosis. S/p bilateral BKA-clean wound on the L  Data Reviewed: Basic Metabolic Panel:  Recent Labs Lab 05/19/15 0350 05/20/15 0415 05/21/15 0505 05/22/15 0435 05/23/15 0240  NA 139 136 138 139 139  K 4.0 4.1 4.4 3.9 4.3  CL 96* 93* 95* 96* 96*  CO2 22 26 25 28 28   GLUCOSE 137* 149* 180* 136* 136*  BUN 21* 42* 65* 22* 34*  CREATININE 4.17* 6.36* 8.33* 4.77* 6.48*  CALCIUM 8.6* 8.9 8.9 8.3* 8.9  PHOS 4.5 5.4* 5.4* 3.1 3.3   Liver Function Tests:  Recent Labs Lab 05/19/15 0350 05/20/15 0415 05/21/15 0505 05/22/15 0435 05/23/15 0240  ALBUMIN 2.6* 2.6* 2.6* 2.5* 2.7*   No results for input(s): LIPASE, AMYLASE in the last 168 hours.  Recent Labs Lab 05/20/15 1945  AMMONIA 26   CBC:  Recent Labs Lab 05/19/15 0350 05/20/15 0415 05/20/15 1945  05/21/15 1459 05/22/15 0435 05/23/15 0240  WBC 11.9* 11.6*  --  11.8* 11.6* 13.3*  HGB 10.0* 9.7*  --  9.3* 9.3* 9.2*  HCT 31.6* 29.7* 29.5* 28.6* 29.4* 28.5*  MCV 96.9 94.3  --  93.5 95.1 96.0  PLT 286 324  --  357 310 356   Cardiac Enzymes: No results for input(s): CKTOTAL, CKMB, CKMBINDEX, TROPONINI in the last 168 hours. BNP (last 3 results) No results for input(s): BNP in the last 8760 hours.  ProBNP (last 3 results) No results for input(s): PROBNP in the last 8760 hours.  CBG:  Recent Labs Lab 05/21/15 2215 05/22/15 0720 05/22/15 1116 05/22/15 1628 05/22/15 2022  GLUCAP 107* 129* 80 131* 97    Recent Results (from the past 240 hour(s))  Culture, blood (routine x 2)     Status: None   Collection Time: 05/14/15 11:22 AM  Result Value Ref Range Status   Specimen Description BLOOD LEFT ANTECUBITAL  Final   Special Requests BOTTLES DRAWN AEROBIC ONLY 3CC  Final   Culture NO GROWTH 5 DAYS  Final   Report Status 05/19/2015 FINAL  Final  Culture,  blood (routine x 2)     Status: None   Collection Time: 05/14/15  3:36 PM  Result Value Ref Range Status   Specimen Description BLOOD LEFT ARM  Final   Special Requests BOTTLES DRAWN AEROBIC AND ANAEROBIC 5CC  Final   Culture NO GROWTH 5 DAYS  Final   Report Status 05/19/2015 FINAL  Final  Surgical pcr screen     Status: None   Collection Time: 05/15/15  8:06 PM  Result Value Ref Range Status   MRSA, PCR NEGATIVE NEGATIVE Final   Staphylococcus aureus NEGATIVE NEGATIVE Final    Comment:        The Xpert SA Assay (FDA approved for NASAL specimens in patients over 95 years of age), is one component of a comprehensive surveillance program.  Test performance has been validated by [facility] for patients greater than or equal to 56 year old. It is not intended to diagnose infection nor to guide or monitor treatment.      Studies: Ct Head Wo Contrast  05/21/2015   CLINICAL DATA:  Probe altered mental status and  fever today.  EXAM: CT HEAD WITHOUT CONTRAST  TECHNIQUE: Contiguous axial images were obtained from the base of the skull through the vertex without intravenous contrast.  COMPARISON:  Head CT scan 05/04/2008.  FINDINGS: Hypoattenuation in the subcortical and periventricular deep white matter is identified. No evidence of acute abnormality including hemorrhage, infarct, mass lesion, mass effect, midline shift or abnormal extra-axial fluid collection is identified. Cavum septum pellucidum is noted. Carotid atherosclerosis is seen.  IMPRESSION: No acute abnormality.  Chronic microvascular ischemic change.  Atherosclerosis.   Electronically Signed   By: Inge Rise M.D.   On: 05/21/2015 12:45    Scheduled Meds: . amLODipine  10 mg Oral Daily  . amphetamine-dextroamphetamine  5 mg Oral Q breakfast  . aspirin EC  81 mg Oral Daily  . atorvastatin  20 mg Oral q1800  . calcitRIOL  0.5 mcg Oral Q T,Th,Sa-HD  . cefTAZidime (FORTAZ)  IV  2 g Intravenous Q T,Th,Sa-HD  . darbepoetin (ARANESP) injection - DIALYSIS  60 mcg Intravenous Q Thu-HD  . docusate sodium  100 mg Oral BID  . famotidine  20 mg Oral Daily  . feeding supplement (ENSURE ENLIVE)  237 mL Oral BID BM  . ferric gluconate (FERRLECIT/NULECIT) IV  62.5 mg Intravenous Q T,Th,Sa-HD  . heparin  5,000 Units Subcutaneous 3 times per day  . hydrALAZINE  25 mg Oral 4 times per day  . insulin aspart  0-9 Units Subcutaneous TID WC  . insulin glargine  10 Units Subcutaneous QHS  . lisinopril  40 mg Oral Daily  . nicotine  21 mg Transdermal Q24H  . pantoprazole  40 mg Oral Daily  . sevelamer carbonate  3,200 mg Oral TID WC  . sodium chloride  3 mL Intravenous Q12H  . vancomycin  750 mg Intravenous Q T,Th,Sa-HD   Continuous Infusions:   Principal Problem:   Gangrene of foot Active Problems:   Hypertension   ESRD on dialysis   HLD (hyperlipidemia)   DM (diabetes mellitus) type II controlled peripheral vascular disorder   PAD (peripheral  artery disease)   Generalized weakness   Hypertensive heart disease   Gangrene of lower extremity   ESRD on hemodialysis   Hypertensive urgency   Acute encephalopathy   Elevated troponin   Protein-calorie malnutrition, severe   Critical lower limb ischemia   Postoperative anemia due to acute blood loss  Time spent: 35 minutes  Verneita Griffes, MD [facility] [phone]

## 2015-05-23 NOTE — Progress Notes (Signed)
PT Cancellation Note  Patient Details Name: Garrett Salas MRN: [id number] DOB: 1951-01-11   Cancelled Treatment:    Reason Eval/Treat Not Completed: Other (comment) (in HD in AM, on  bedpan in PM. Possible DC to snf soon.)   Claretha Cooper 05/23/2015, 4:00 PM Tresa Endo PT [phone]

## 2015-05-23 NOTE — Progress Notes (Signed)
[facility] Rounding Note:  Subjective:   Seen in dialysis Knows me, knows it is 2016 - can't tell me where he is and says "this is kinda confusing" Denies pain of any kind or SOB  Objective Vital signs in last 24 hours: Filed Vitals:   05/22/15 1421 05/22/15 2020 05/23/15 0010 05/23/15 0607  BP: 173/61 102/32 189/78 190/76  Pulse: 92 86  95  Temp: 98.5 F (36.9 C) 98.3 F (36.8 C)  98 F (36.7 C)  TempSrc: Oral Oral  Oral  Resp: 18   18  Height:      Weight:    47.265 kg (104 lb 3.2 oz)  SpO2: 100% 100%  99%   Weight change: 0.665 kg (1 lb 7.5 oz)  Physical Exam: Cachectic AAM NAD and seen in the dialysis unit [place]: Regular S1S2 No S3  2/6 murmur left sternal border No diastolic murmur Lungs: Lungs clear posteriorly  Abdomen: soft NT, ND  Extremities: bilat BKA, no edema.  Left BKA staples intact, incision line clean and dry   Access: + Bruit R UA AVF   Labs:   Recent Labs Lab 05/21/15 0505 05/22/15 0435 05/23/15 0240  NA 138 139 139  K 4.4 3.9 4.3  CL 95* 96* 96*  CO2 25 28 28   GLUCOSE 180* 136* 136*  BUN 65* 22* 34*  CREATININE 8.33* 4.77* 6.48*  CALCIUM 8.9 8.3* 8.9  PHOS 5.4* 3.1 3.3     Recent Labs Lab 05/21/15 0505 05/22/15 0435 05/23/15 0240  ALBUMIN 2.6* 2.5* 2.7*     Recent Labs Lab 05/19/15 0350 05/20/15 0415  05/21/15 1459 05/22/15 0435 05/23/15 0240  WBC 11.9* 11.6*  --  11.8* 11.6* 13.3*  HGB 10.0* 9.7*  --  9.3* 9.3* 9.2*  HCT 31.6* 29.7*  < > 28.6* 29.4* 28.5*  MCV 96.9 94.3  --  93.5 95.1 96.0  PLT 286 324  --  357 310 356  < > = values in this interval not displayed.  CBG:  Recent Labs Lab 05/21/15 2215 05/22/15 0720 05/22/15 1116 05/22/15 1628 05/22/15 2022  GLUCAP 107* 129* 80 131* 97    Studies/Results: Ct Head Wo Contrast  05/21/2015   CLINICAL DATA:  Probe altered mental status and fever today.  EXAM: CT HEAD WITHOUT CONTRAST  TECHNIQUE: Contiguous axial images were obtained  from the base of the skull through the vertex without intravenous contrast.  COMPARISON:  Head CT scan 05/04/2008.  FINDINGS: Hypoattenuation in the subcortical and periventricular deep white matter is identified. No evidence of acute abnormality including hemorrhage, infarct, mass lesion, mass effect, midline shift or abnormal extra-axial fluid collection is identified. Cavum septum pellucidum is noted. Carotid atherosclerosis is seen.  IMPRESSION: No acute abnormality.  Chronic microvascular ischemic change.  Atherosclerosis.   Electronically Signed   By: Inge Rise M.D.   On: 05/21/2015 12:45   Medications:   . amLODipine  10 mg Oral Daily  . amphetamine-dextroamphetamine  5 mg Oral Q breakfast  . aspirin EC  81 mg Oral Daily  . atorvastatin  20 mg Oral q1800  . calcitRIOL  0.5 mcg Oral Q T,Th,Sa-HD  . cefTAZidime (FORTAZ)  IV  2 g Intravenous Q T,Th,Sa-HD  . darbepoetin (ARANESP) injection - DIALYSIS  60 mcg Intravenous Q Thu-HD  . docusate sodium  100 mg Oral BID  . famotidine  20 mg Oral Daily  . feeding supplement (ENSURE ENLIVE)  237 mL Oral BID BM  . ferric gluconate (FERRLECIT/NULECIT)  IV  62.5 mg Intravenous Q T,Th,Sa-HD  . heparin  5,000 Units Subcutaneous 3 times per day  . hydrALAZINE  25 mg Oral 4 times per day  . insulin aspart  0-9 Units Subcutaneous TID WC  . insulin glargine  10 Units Subcutaneous QHS  . lisinopril  40 mg Oral Daily  . nicotine  21 mg Transdermal Q24H  . pantoprazole  40 mg Oral Daily  . sevelamer carbonate  3,200 mg Oral TID WC  . sodium chloride  3 mL Intravenous Q12H  . vancomycin  750 mg Intravenous Q T,Th,Sa-HD    TTS [place] 3.5h  52kg will have new lower EDW at discharge [place] 2/2.25 Heparin none RUA AVF Calcitriol 0.5 ug, Mircera 50 q 4wks next 6/28 Venofer 100 x 5 thru 7/7 Other Op labs hgb 11.2 05/09/15 tfs 10%    Problem: 1. LBKA 6/30 for gangrenous foot - on  Vanc/fortaz with stop date to be  05/30/15. Both can be  continued at the outpt HD unit 2. AMS - Still not at baseline but more alert today and seems to recognize that he has had some confusion.  CT neg for anything acute - just a lot of deep white matter ds.  3. Cardiac - +trop, cards signed off. Felt demand ischemia. Last ECHO was 12/2014 normal LVF at that time. 4. ESRD HD TTS. 5. HTN/ vol - appopriate drop in body wt after leg amp, no vol excess on exam (has been taking in very little) 6. Anemia - Cont esa/Fe (darbe for mircera while inpt). Last dose of iron load is today 7. Metabolic bone disease - cont vit D/ binders 8. IDDM- per admit 9. Nutrition - renal ,carb mod Renal vitamin 10. Dispo - Notes suggest pt will be going to [place] - if this bears out will need to have days changed to MWF and if he leaves today, will need a treatment there tomorrow. Will await word from Case Manager re: specifics of placement.  Jamal Maes, MD [facility] [phone] Pager 05/23/2015, 7:33 AM

## 2015-05-23 NOTE — Procedures (Signed)
I have personally attended this patient's dialysis session.  Initiating treatment now via R AVF 2K bath Post weight today will be new EDW  Jamal Maes, MD [place] Pager 05/23/2015, 7:40 AM

## 2015-05-24 DIAGNOSIS — K5900 Constipation, unspecified: Secondary | ICD-10-CM | POA: Diagnosis not present

## 2015-05-24 DIAGNOSIS — E1129 Type 2 diabetes mellitus with other diabetic kidney complication: Secondary | ICD-10-CM | POA: Diagnosis not present

## 2015-05-24 DIAGNOSIS — D509 Iron deficiency anemia, unspecified: Secondary | ICD-10-CM | POA: Diagnosis not present

## 2015-05-24 DIAGNOSIS — E785 Hyperlipidemia, unspecified: Secondary | ICD-10-CM | POA: Diagnosis not present

## 2015-05-24 DIAGNOSIS — I10 Essential (primary) hypertension: Secondary | ICD-10-CM | POA: Diagnosis not present

## 2015-05-24 DIAGNOSIS — K5901 Slow transit constipation: Secondary | ICD-10-CM | POA: Diagnosis not present

## 2015-05-24 DIAGNOSIS — K219 Gastro-esophageal reflux disease without esophagitis: Secondary | ICD-10-CM | POA: Diagnosis not present

## 2015-05-24 DIAGNOSIS — I120 Hypertensive chronic kidney disease with stage 5 chronic kidney disease or end stage renal disease: Secondary | ICD-10-CM | POA: Diagnosis not present

## 2015-05-24 DIAGNOSIS — G4700 Insomnia, unspecified: Secondary | ICD-10-CM | POA: Diagnosis not present

## 2015-05-24 DIAGNOSIS — N186 End stage renal disease: Secondary | ICD-10-CM | POA: Diagnosis not present

## 2015-05-24 DIAGNOSIS — R279 Unspecified lack of coordination: Secondary | ICD-10-CM | POA: Diagnosis not present

## 2015-05-24 DIAGNOSIS — E876 Hypokalemia: Secondary | ICD-10-CM | POA: Diagnosis not present

## 2015-05-24 DIAGNOSIS — D508 Other iron deficiency anemias: Secondary | ICD-10-CM | POA: Diagnosis not present

## 2015-05-24 DIAGNOSIS — I96 Gangrene, not elsewhere classified: Secondary | ICD-10-CM | POA: Diagnosis not present

## 2015-05-24 DIAGNOSIS — E11319 Type 2 diabetes mellitus with unspecified diabetic retinopathy without macular edema: Secondary | ICD-10-CM | POA: Diagnosis not present

## 2015-05-24 DIAGNOSIS — D631 Anemia in chronic kidney disease: Secondary | ICD-10-CM | POA: Diagnosis not present

## 2015-05-24 DIAGNOSIS — R262 Difficulty in walking, not elsewhere classified: Secondary | ICD-10-CM | POA: Diagnosis not present

## 2015-05-24 DIAGNOSIS — Z794 Long term (current) use of insulin: Secondary | ICD-10-CM | POA: Diagnosis not present

## 2015-05-24 DIAGNOSIS — M6281 Muscle weakness (generalized): Secondary | ICD-10-CM | POA: Diagnosis not present

## 2015-05-24 DIAGNOSIS — I739 Peripheral vascular disease, unspecified: Secondary | ICD-10-CM | POA: Diagnosis not present

## 2015-05-24 DIAGNOSIS — E1140 Type 2 diabetes mellitus with diabetic neuropathy, unspecified: Secondary | ICD-10-CM | POA: Diagnosis not present

## 2015-05-24 LAB — GLUCOSE, CAPILLARY
GLUCOSE-CAPILLARY: 200 mg/dL — AB (ref 65–99)
Glucose-Capillary: 123 mg/dL — ABNORMAL HIGH (ref 65–99)

## 2015-05-24 LAB — CBC
HCT: 29.3 % — ABNORMAL LOW (ref 39.0–52.0)
Hemoglobin: 9.6 g/dL — ABNORMAL LOW (ref 13.0–17.0)
MCH: 31.4 pg (ref 26.0–34.0)
MCHC: 32.8 g/dL (ref 30.0–36.0)
MCV: 95.8 fL (ref 78.0–100.0)
Platelets: 292 10*3/uL (ref 150–400)
RBC: 3.06 MIL/uL — AB (ref 4.22–5.81)
RDW: 16.1 % — ABNORMAL HIGH (ref 11.5–15.5)
WBC: 12.5 10*3/uL — ABNORMAL HIGH (ref 4.0–10.5)

## 2015-05-24 LAB — RENAL FUNCTION PANEL
ALBUMIN: 2.6 g/dL — AB (ref 3.5–5.0)
ANION GAP: 15 (ref 5–15)
BUN: 22 mg/dL — AB (ref 6–20)
CO2: 26 mmol/L (ref 22–32)
Calcium: 8.5 mg/dL — ABNORMAL LOW (ref 8.9–10.3)
Chloride: 91 mmol/L — ABNORMAL LOW (ref 101–111)
Creatinine, Ser: 4.5 mg/dL — ABNORMAL HIGH (ref 0.61–1.24)
GFR, EST AFRICAN AMERICAN: 15 mL/min — AB (ref >60)
GFR, EST NON AFRICAN AMERICAN: 13 mL/min — AB (ref >60)
Glucose, Bld: 129 mg/dL — ABNORMAL HIGH (ref 65–99)
PHOSPHORUS: 2 mg/dL — AB (ref 2.5–4.6)
Potassium: 3.5 mmol/L (ref 3.5–5.1)
Sodium: 132 mmol/L — ABNORMAL LOW (ref 135–145)

## 2015-05-24 MED ORDER — PANTOPRAZOLE SODIUM 40 MG PO TBEC: 40.0000 mg | DELAYED_RELEASE_TABLET | Freq: Every day | ORAL | Status: DC

## 2015-05-24 MED ORDER — HYDRALAZINE HCL 25 MG PO TABS: 25.0000 mg | ORAL_TABLET | Freq: Four times a day (QID) | ORAL | Status: DC

## 2015-05-24 MED ORDER — CALCITRIOL 0.5 MCG PO CAPS: 0.5000 ug | ORAL_CAPSULE | ORAL | Status: AC

## 2015-05-24 MED ORDER — PENTAFLUOROPROP-TETRAFLUOROETH EX AERO: 1.0000 "application " | INHALATION_SPRAY | CUTANEOUS | Status: DC | PRN

## 2015-05-24 MED ORDER — HEPARIN SODIUM (PORCINE) 1000 UNIT/ML DIALYSIS: 1000.0000 [IU] | INTRAMUSCULAR | Status: DC | PRN

## 2015-05-24 MED ORDER — LIDOCAINE-PRILOCAINE 2.5-2.5 % EX CREA
1.0000 | TOPICAL_CREAM | CUTANEOUS | Status: DC | PRN
Start: 2015-05-24 — End: 2015-05-24
  Filled 2015-05-24: qty 5

## 2015-05-24 MED ORDER — SODIUM CHLORIDE 0.9 % IV SOLN: 100.0000 mL | INTRAVENOUS | Status: DC | PRN

## 2015-05-24 MED ORDER — VANCOMYCIN HCL IN DEXTROSE 750-5 MG/150ML-% IV SOLN
750.0000 mg | INTRAVENOUS | Status: DC
  Administered 2015-05-24: 750 mg via INTRAVENOUS
  Filled 2015-05-24: qty 150

## 2015-05-24 MED ORDER — DEXTROSE 5 % IV SOLN
2.0000 g | INTRAVENOUS | Status: DC
  Administered 2015-05-24: 2 g via INTRAVENOUS
  Filled 2015-05-24: qty 2

## 2015-05-24 MED ORDER — NEPRO/CARBSTEADY PO LIQD
237.0000 mL | ORAL | Status: DC | PRN
Start: 2015-05-24 — End: 2015-05-24
  Filled 2015-05-24: qty 237

## 2015-05-24 MED ORDER — ALTEPLASE 2 MG IJ SOLR
2.0000 mg | Freq: Once | INTRAMUSCULAR | Status: DC | PRN
  Filled 2015-05-24: qty 2

## 2015-05-24 MED ORDER — VANCOMYCIN HCL IN DEXTROSE 750-5 MG/150ML-% IV SOLN: 750.0000 mg | INTRAVENOUS | Status: DC

## 2015-05-24 MED ORDER — LIDOCAINE HCL (PF) 1 % IJ SOLN: 5.0000 mL | INTRAMUSCULAR | Status: DC | PRN

## 2015-05-24 MED ORDER — DEXTROSE 5 % IV SOLN: 2.0000 g | INTRAVENOUS | Status: DC

## 2015-05-24 MED ORDER — AMLODIPINE BESYLATE 10 MG PO TABS: 10.0000 mg | ORAL_TABLET | Freq: Every day | ORAL | Status: DC

## 2015-05-24 MED ORDER — ATORVASTATIN CALCIUM 20 MG PO TABS: 20.0000 mg | ORAL_TABLET | Freq: Every day | ORAL | Status: DC

## 2015-05-24 NOTE — Discharge Summary (Addendum)
. Physician Discharge Summary  [facility] TKW:[id number] DOB: 12-Feb-1951 DOA: 05/14/2015  PCP: [name], MD  Admit date: 05/14/2015 Discharge date: 05/24/2015  Time spent: 45 minutes  Recommendations for Outpatient Follow-up:    1. complete vancomycin and Fortaz on 7/14-to be given post-dialysis M/W/F 2. Patient will need CBC and renal panel in about 5 days 3. Patient going to M/W/F schedule for dialysis 4. ABSOLUTELY no narcotics-causes toxic metabolic encephalopathy--would recommend very low doses of Tylenol No. 3 versus scheduled NSAID with food for pain control 5. Consider outpatient neurosurgery input for potential normal pressure hydrocephalus. 6. Regular wound care to left lower extremity stump 7. Consider nutritionist input at nursing facility 8.  if discharging to SNF, indicate specific facility 9. (include homehealth, outpatient follow-up instructions, specific recommendations for PCP to follow-up on, etc.)  Discharge Diagnoses:  Principal Problem:   Gangrene of foot Active Problems:   Hypertension   ESRD on dialysis   HLD (hyperlipidemia)   DM (diabetes mellitus) type II controlled peripheral vascular disorder   PAD (peripheral artery disease)   Generalized weakness   Hypertensive heart disease   Gangrene of lower extremity   ESRD on hemodialysis   Hypertensive urgency   Acute encephalopathy   Elevated troponin   Protein-calorie malnutrition, severe   Critical lower limb ischemia   Postoperative anemia due to acute blood loss   Discharge Condition: fair  Diet recommendation: renal diet   Filed Weights   05/23/15 0727 05/23/15 1127 05/24/15 0551  Weight: 45.9 kg (101 lb 3.1 oz) 44.8 kg (98 lb 12.3 oz) 44.7 kg (98 lb 8.7 oz)    History of present illness:   64 y/o ?, Prior critical limb ischemia 2/22-2/29/16 status post aortogram = no target = 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 Bridgett Larsson palliative versus comfort measures Nuclear stress April 2016 = EF 39%-cardiac cath =60% 05/06/11 Patient was admitted on 05/14/15 withtoxic metabolic encephalopathy LLE gangrene the VS was consulted he shows placed on broad spectrum vancomycin/Zosyn nephrology was consulted for ESRD. 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 Cultures negative. Continue empiric Antibiotics Vacn/Foratz till 05/30/15 c dialysis  Patient's pain regimen was adjusted  Per vascular surgery staple 72, in the office in 4 weeks. Will need to follow-up her vascular surgeon as outpatient on discharge.  #2 acute toxic metabolic encephalopathy  DDX Microvascular infarct vs Hypoglycemia vs #1  Patient noted to be confused which is improving. Discontinued IV morphine. Discontinue oxycodone. Discontinued Norco. D/c Tylenol # 3--codeine can accumulate in dialysis patients-would prefer dilaudid very low dose if prn  Patient also noted to be on Reglan by mouth and home dose of Adderall.  Discontinued Reglan completely on 7/6, keep adderall at 5 mg daily   Continue to limit narcotic medications as much as possible.  Discontinued IV morphine sulfate and norco.   B-12 levels  at 665. Ammonia level was 26. RPR is pending. RBC folate is pending.   CT head without contrast = no microvascular issues.  Given persistent confusion feel MRI indicated which  has been ordered.-The MRI showed a persistent normal pressure hydrocephalus since 2009 and I discussed the case with Dr. Cyndy Freeze of neurosurgery over the telephone. His impression is that this is chronic and given sudden improvement with withdrawal of pain medications, no intervention is really warranted.  We will set him up for an outpatient follow-up with neurosurgery  In the event that patient is significantly confused, would recommend high-volume lumbar puncture to denote opening pressure and withdrawal 30-40 cc however I do not think this is clinically indicated given his significant improvement over the past 48 hours  #3 elevated troponin and EKG changes  Seen in consultation by cardiology who patient likely had a demand ischemia secondary to his acute infection.   Wet gangrene of the left foot with cellulitis.patient denies any chest pain.  2-D echo which was done early on showed a normal LV function.   His felt that elevated troponin I EKG changes likely secondary to acute infection and severe hypertension.   Cardiology feels no further cardiac workup is needed and to manage blood pressure.  #4 end-stage renal disease on hemodialysis & secondary hyperparathyroidism and anemia of renal disease  Per nephrology.  recieved IV iron  Patient's dates for dialysis may need to be changed. Case manager made aware--Patient to d/c to [place] SN[facility] so will be in the future a M/W/F patient  #5 hypertensive urgency   Improvement with blood pressure post hemodialysis.   Continue lisinopril 40 mg daily. Increase Norvasc to 5 mg daily-->10 mg daily.   Titrate blood pressure as needed for better blood pressure control.  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 CBGs low at night and lantus 10 being held]  Continue sliding scale insulin.   I did discontinue Continue Reglan On 7/6 in an effort to determine if patient's mental status  was related to this  If needed we will start erythromycin for motility.  #7 generalized weakness Likely secondary to problem #1. PT/OT. Will need skilled care.  #8 postoperative anemia Patient with no overt bleeding. Hemoglobin is stabilized. Anemia panel consistent with anemia of chronic disease. Follow H&H.    Discharge Exam: Filed Vitals:   05/24/15 0551  BP: 164/75  Pulse: 93  Temp: 98 F (36.7 C)  Resp: 18    General: More alert, still confabulating a little bit and thinks that I know him from prior hospital visit [I don't]  Cardiovascular: S1-S2 no murmur rub or gallop, no JVD,   Respiratory: clinically clear no added sound Wound on left lower extremity has staples in place and seems clean  Discharge Instructions   Discharge Instructions    Diet - low sodium heart healthy    Complete by:  As directed      Increase activity slowly    Complete by:  As directed           Current Discharge Medication List    START taking these medications   Details  amLODipine (NORVASC) 10 MG tablet Take 1 tablet (10 mg total) by mouth daily. Qty: 30 tablet, Refills: 0    atorvastatin (LIPITOR) 20 MG tablet Take 1 tablet (20 mg total) by mouth daily at 6 PM. Qty: 30 tablet, Refills: 0    calcitRIOL (ROCALTROL) 0.5 MCG capsule Take 1 capsule (0.5  mcg total) by mouth Every Tuesday,Thursday,and Saturday with dialysis. Qty: 30 capsule    cefTAZidime 2 g in dextrose 5 % 50 mL Inject 2 g into the vein Every Tuesday,Thursday,and Saturday with dialysis. Qty: 2 g, Refills: 0    hydrALAZINE (APRESOLINE) 25 MG tablet Take 1 tablet (25 mg total) by mouth every 6 (six) hours.    pantoprazole (PROTONIX) 40 MG tablet Take 1 tablet (40 mg total) by mouth daily.    Vancomycin (VANCOCIN) 750 MG/150ML SOLN Inject 150 mLs (750 mg total) into the vein Every Tuesday,Thursday,and Saturday with dialysis. Qty: 150 mL, Refills: 0      CONTINUE these medications which have NOT CHANGED   Details   ACCU-CHEK AVIVA PLUS test strip     ACCU-CHEK SOFTCLIX LANCETS lancets     albuterol (PROVENTIL HFA;VENTOLIN HFA) 108 (90 BASE) MCG/ACT inhaler Inhale 2 puffs into the lungs every 6 (six) hours as needed for wheezing or shortness of breath.    amphetamine-dextroamphetamine (ADDERALL) 5 MG tablet Take 5 mg by mouth daily.     aspirin EC 81 MG tablet Take 81 mg by mouth daily.    B-D UF III MINI PEN NEEDLES 31G X 5 MM MISC     Blood Glucose Monitoring Suppl (ACCU-CHEK AVIVA PLUS) W/DEVICE KIT     HUMULIN 70/30 KWIKPEN (70-30) 100 UNIT/ML PEN Inject 5-20 Units into the skin 2 (two) times daily as needed (CBG over 175).  Refills: 5    lisinopril (PRINIVIL,ZESTRIL) 20 MG tablet Take 20 mg by mouth daily.    ranitidine (ZANTAC) 150 MG tablet Take 150 mg by mouth daily.     sevelamer (RENVELA) 800 MG tablet Take 3,200 mg by mouth 3 (three) times daily with meals.     silver sulfADIAZINE (SILVADENE) 1 % cream Apply 1 application topically daily. Qty: 50 g, Refills: 0    ULTICARE INSULIN SYRINGE 29G X 1/2" 0.5 ML MISC       STOP taking these medications     HYDROcodone-acetaminophen (NORCO) 10-325 MG per tablet      insulin glargine (LANTUS) 100 unit/mL SOPN      metoCLOPramide (REGLAN) 5 MG tablet      pregabalin (LYRICA) 75 MG capsule      simvastatin (ZOCOR) 40 MG tablet        No Known Allergies    The results of significant diagnostics from this hospitalization (including imaging, microbiology, ancillary and laboratory) are listed below for reference.    Significant Diagnostic Studies: Dg Chest 2 View  05/14/2015   CLINICAL DATA:  Shortness of Breath, history of diabetes, generalized weakness  EXAM: CHEST  2 VIEW  COMPARISON:  01/11/2015  FINDINGS: Borderline cardiomegaly. Mild interstitial prominence bilaterally without convincing pulmonary edema. No segmental infiltrate. No pneumothorax. Bony thorax is stable.  IMPRESSION: Mild interstitial prominence bilateral  without convincing pulmonary edema. No segmental infiltrate.   Electronically Signed   By: Lahoma Crocker M.D.   On: 05/14/2015 12:28   Ct Head Wo Contrast  05/21/2015   CLINICAL DATA:  Probe altered mental status and fever today.  EXAM: CT HEAD WITHOUT CONTRAST  TECHNIQUE: Contiguous axial images were obtained from the base of the skull through the vertex without intravenous contrast.  COMPARISON:  Head CT scan 05/04/2008.  FINDINGS: Hypoattenuation in the subcortical and periventricular deep white matter is identified. No evidence of acute abnormality including hemorrhage, infarct, mass lesion, mass effect, midline shift or abnormal extra-axial fluid collection is identified. Cavum septum pellucidum is noted.  Carotid atherosclerosis is seen.  IMPRESSION: No acute abnormality.  Chronic microvascular ischemic change.  Atherosclerosis.   Electronically Signed   By: Inge Rise M.D.   On: 05/21/2015 12:45   Mr Brain Wo Contrast  05/23/2015   CLINICAL DATA:  64 year old male with confusion. Recent fever. Initial encounter.  EXAM: MRI HEAD WITHOUT CONTRAST  TECHNIQUE: Multiplanar, multiecho pulse sequences of the brain and surrounding structures were obtained without intravenous contrast.  COMPARISON:  Head CT without contrast 05/21/2015, and earlier.  FINDINGS: Cavum septum pellucidum anatomic variant again noted. Ventriculomegaly which has progressed since 2009. Periventricular white matter T2 and FLAIR hyperintensity, but sparing the temporal horns such that transependymal edema is not suspected. There is probably a small ventricular diverticulum at the left frontal horn (series 5, image 16), inconsequential. Some hyperdynamic flow at the cerebral aqueduct (series 3, image 13). The fourth ventricle also is prominent.  No restricted diffusion to suggest acute infarction. No midline shift, mass effect, evidence of mass lesion, extra-axial collection or acute intracranial hemorrhage. Cervicomedullary junction and  pituitary are within normal limits. Major intracranial vascular flow voids are within normal limits.  Aside from the periventricular white matter signal changes, there is patchy T2 and FLAIR hyperintensity also in the pons. The deep gray matter nuclei appear within normal limits for age. No cortical encephalomalacia or chronic cerebral blood products identified. Cerebellum within normal limits for age.  Visible internal auditory structures appear normal. Mastoids are clear. Left maxillary sinus retention cyst. Dysconjugate gaze. Postoperative changes to the right globe. Visualized scalp soft tissues are within normal limits. Negative visualized cervical spine. Visualized bone marrow signal is within normal limits.  IMPRESSION: 1.  No acute intracranial abnormality. 2. Prominence of the ventricular system such that in the appropriate clinical setting normal pressure hydrocephalus should be considered. 3. Moderate nonspecific signal changes in the cerebral white matter and pons, most commonly due to chronic small vessel disease.   Electronically Signed   By: Genevie Ann M.D.   On: 05/23/2015 19:40   Mr Foot Left Wo Contrast  05/15/2015   CLINICAL DATA:  Diabetic patient with gangrene of the left fluid left foot pain subjective fever. The patient's left second, third and fourth toes and base of the fifth toe are black and gangrenous.  EXAM: MRI OF THE LEFT FOREFOOT WITHOUT CONTRAST  TECHNIQUE: Multiplanar, multisequence MR imaging was performed. No intravenous contrast was administered.  COMPARISON:  Plain films of the right foot 01/02/2015.  FINDINGS: The study is degraded by patient motion. Subcutaneous edema is seen over the dorsum of the foot without focal fluid collection. No bone marrow signal abnormality to suggest osteomyelitis is identified. No evidence of soft tissue gas is seen. There is no fracture or other focal bony abnormality. No notable arthropathy is seen.  IMPRESSION: Mild appearing subcutaneous edema  over the dorsum of the foot. Negative for abscess or osteomyelitis.   Electronically Signed   By: Inge Rise M.D.   On: 05/15/2015 08:26    Microbiology: Recent Results (from the past 240 hour(s))  Culture, blood (routine x 2)     Status: None   Collection Time: 05/14/15 11:22 AM  Result Value Ref Range Status   Specimen Description BLOOD LEFT ANTECUBITAL  Final   Special Requests BOTTLES DRAWN AEROBIC ONLY 3CC  Final   Culture NO GROWTH 5 DAYS  Final   Report Status 05/19/2015 FINAL  Final  Culture, blood (routine x 2)     Status: None  Collection Time: 05/14/15  3:36 PM  Result Value Ref Range Status   Specimen Description BLOOD LEFT ARM  Final   Special Requests BOTTLES DRAWN AEROBIC AND ANAEROBIC 5CC  Final   Culture NO GROWTH 5 DAYS  Final   Report Status 05/19/2015 FINAL  Final  Surgical pcr screen     Status: None   Collection Time: 05/15/15  8:06 PM  Result Value Ref Range Status   MRSA, PCR NEGATIVE NEGATIVE Final   Staphylococcus aureus NEGATIVE NEGATIVE Final    Comment:        The Xpert SA Assay (FDA approved for NASAL specimens in patients over 72 years of age), is one component of a comprehensive surveillance program.  Test performance has been validated by [facility] for patients greater than or equal to 71 year old. It is not intended to diagnose infection nor to guide or monitor treatment.      Labs: Basic Metabolic Panel:  Recent Labs Lab 05/19/15 0350 05/20/15 0415 05/21/15 0505 05/22/15 0435 05/23/15 0240  NA 139 136 138 139 139  K 4.0 4.1 4.4 3.9 4.3  CL 96* 93* 95* 96* 96*  CO2 _0 GLUCOSE 137* 149* 180* 136* 136*  BUN 21* 42* 65* 22* 34*  CREATININE 4.17* 6.36* 8.33* 4.77* 6.48*  CALCIUM 8.6* 8.9 8.9 8.3* 8.9  PHOS 4.5 5.4* 5.4* 3.1 3.3   Liver Function Tests:  Recent Labs Lab 05/19/15 0350 05/20/15 0415 05/21/15 0505 05/22/15 0435 05/23/15 0240  ALBUMIN 2.6* 2.6* 2.6* 2.5* 2.7*   No results for input(s):  LIPASE, AMYLASE in the last 168 hours.  Recent Labs Lab 05/20/15 1945  AMMONIA 26   CBC:  Recent Labs Lab 05/19/15 0350 05/20/15 0415 05/20/15 1945 05/21/15 1459 05/22/15 0435 05/23/15 0240  WBC 11.9* 11.6*  --  11.8* 11.6* 13.3*  HGB 10.0* 9.7*  --  9.3* 9.3* 9.2*  HCT 31.6* 29.7* 29.5* 28.6* 29.4* 28.5*  MCV 96.9 94.3  --  93.5 95.1 96.0  PLT 286 324  --  357 310 356   Cardiac Enzymes: No results for input(s): CKTOTAL, CKMB, CKMBINDEX, TROPONINI in the last 168 hours. BNP: BNP (last 3 results) No results for input(s): BNP in the last 8760 hours.  ProBNP (last 3 results) No results for input(s): PROBNP in the last 8760 hours.  CBG:  Recent Labs Lab 05/22/15 1628 05/22/15 2022 05/23/15 1251 05/23/15 1615 05/24/15 0741  GLUCAP 131* 97 115* 176* 200*       Signed:  Nita Sells  [facility] 05/24/2015, 9:48 AM

## 2015-05-24 NOTE — Progress Notes (Signed)
[facility] Progress Note   Subjective: going to stand up with PT and one prosthesis.  No complaints, still confused but interactive  Filed Vitals:   05/23/15 1838 05/23/15 2012 05/24/15 0012 05/24/15 0551  BP: 144/66 125/53 131/68 164/75  Pulse:  95  93  Temp:  99.6 F (37.6 C)  98 F (36.7 C)  TempSrc:  Oral  Oral  Resp:  18  18  Height:      Weight:    44.7 kg (98 lb 8.7 oz)  SpO2:  100%  98%   Exam: Alert, sitting up in chair No jvd Chest clear bilat RRR no MRG Abd soft ntnd no mass or ascites Ext bilat BKA, no edema  RUA AVF +bruit Neuro nonfocal  TTS Nubieber 3.5h 52kg (lower at d/c)  Bath 2/2.25 Heparin none RUA AVF Calcitriol 0.5 ug, Mircera 50 q 4wks next 6/28 Venofer 100 x 5 thru 7/7 Other Op labs hgb 11.2 05/09/15 tfs 10%      Assessment: 1. L BKA 6/30, on v/fortax thru 7/14 2. AMS gradual improvement off narcotics 3. Cardiac +trop - cards signed off. Demand ischemia. Last echo wnl.  4. ESRD TTS HD 5. HTN stable 6. Vol new dry wt ~45kg 7. Anemia on darbe, just finished Fe load 8. MBD cont D/binders 9. IDDM 10. Dispo to SNF soon, if goes to [place] will need to change to MWF HD  Plan - HD today prior to possible dc (get on MWF schedule.     Kelly Splinter MD  pager [phone]    cell [phone]  05/24/2015, 9:40 AM     Recent Labs Lab 05/21/15 0505 05/22/15 0435 05/23/15 0240  NA 138 139 139  K 4.4 3.9 4.3  CL 95* 96* 96*  CO2 25 28 28   GLUCOSE 180* 136* 136*  BUN 65* 22* 34*  CREATININE 8.33* 4.77* 6.48*  CALCIUM 8.9 8.3* 8.9  PHOS 5.4* 3.1 3.3    Recent Labs Lab 05/21/15 0505 05/22/15 0435 05/23/15 0240  ALBUMIN 2.6* 2.5* 2.7*    Recent Labs Lab 05/21/15 1459 05/22/15 0435 05/23/15 0240  WBC 11.8* 11.6* 13.3*  HGB 9.3* 9.3* 9.2*  HCT 28.6* 29.4* 28.5*  MCV 93.5 95.1 96.0  PLT 357 310 356   . amLODipine  10 mg Oral Daily  . amphetamine-dextroamphetamine  5 mg Oral Q breakfast  . aspirin EC  81 mg  Oral Daily  . atorvastatin  20 mg Oral q1800  . calcitRIOL  0.5 mcg Oral Q T,Th,Sa-HD  . cefTAZidime (FORTAZ)  IV  2 g Intravenous Q T,Th,Sa-HD  . darbepoetin (ARANESP) injection - DIALYSIS  60 mcg Intravenous Q Thu-HD  . docusate sodium  100 mg Oral BID  . famotidine  20 mg Oral Daily  . feeding supplement (ENSURE ENLIVE)  237 mL Oral BID BM  . heparin  5,000 Units Subcutaneous 3 times per day  . hydrALAZINE  25 mg Oral 4 times per day  . insulin aspart  0-9 Units Subcutaneous TID WC  . insulin glargine  10 Units Subcutaneous QHS  . lisinopril  40 mg Oral Daily  . nicotine  21 mg Transdermal Q24H  . pantoprazole  40 mg Oral Daily  . sevelamer carbonate  3,200 mg Oral TID WC  . sodium chloride  3 mL Intravenous Q12H  . vancomycin  750 mg Intravenous Q T,Th,Sa-HD     acetaminophen **OR** acetaminophen, alum & mag hydroxide-simeth, guaiFENesin-dextromethorphan, ipratropium-albuterol, labetalol, metoprolol, ondansetron, phenol, potassium chloride

## 2015-05-24 NOTE — Progress Notes (Signed)
Called report to stephanie at [facility] and rehab.  All questions answered.

## 2015-05-24 NOTE — Care Management (Signed)
Important Message  Patient Details  Name: Garrett Salas MRN: [id number] Date of Birth: 10-Oct-1951   Medicare Important Message Given:  Yes-fourth notification given    Delorse Lek 05/24/2015, 10:37 AM

## 2015-05-24 NOTE — Clinical Social Work Placement (Signed)
   CLINICAL SOCIAL WORK PLACEMENT  NOTE  Date:  05/24/2015  Patient Details  Name: Garrett Salas MRN: [id number] Date of Birth: 01-Feb-1951  Clinical Social Work is seeking post-discharge placement for this patient at the [place] level of care (*CSW will initial, date and re-position this form in  chart as items are completed):  Yes   Patient/family provided with [place] Work Department's list of facilities offering this level of care within the geographic area requested by the patient (or if unable, by the patient's family).  Yes   Patient/family informed of their freedom to choose among providers that offer the needed level of care, that participate in Medicare, Medicaid or managed care program needed by the patient, have an available bed and are willing to accept the patient.  Yes   Patient/family informed of [place]'s ownership interest in [facility] and [facility], as well as of the fact that they are under no obligation to receive care at these facilities.  PASRR submitted to EDS on 05/22/15     PASRR number received on 05/22/15     Existing PASRR number confirmed on       FL2 transmitted to all facilities in geographic area requested by pt/family on 05/22/15     FL2 transmitted to all facilities within larger geographic area on       Patient informed that his/her managed care company has contracts with or will negotiate with certain facilities, including the following:   (NA)     Yes   Patient/family informed of bed offers received.  Patient chooses bed at [facility] and [place] recommends and patient chooses bed at      Patient to be transferred to [facility] Rehab on 05/24/15.  Patient to be transferred to facility by [facility]     Patient family notified on 05/24/15 of transfer.  Name of family member notified:   [facility] (wife))     PHYSICIAN       Additional Comment:     _______________________________________________ Berton Mount, [place]

## 2015-05-24 NOTE — Progress Notes (Signed)
Physical Therapy Treatment Patient Details Name: Garrett Salas MRN: [id number] DOB: 04/03/1951 Today's Date: 05/24/2015    History of Present Illness Garrett Salas is a 64 y.o. male with history of ESRD on hemodialysis TTS, peripheral vascular disease, right BKA, diabetes.  Patient admitted 05/14/15 with generalized weakness, subjective fevers, left foot pain and gangrene. In ED, found to have elevated troponin 1.17 with EKG changes. Denies any chest pain. 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    PT Comments    Progressing much better this session with more appropriate demeanor and cooperative with treatment.  Feel SNF level rehab, though still best as wife works and patient may continue with safety awareness issues.  PT to follow acutely.   Follow Up Recommendations  Supervision/Assistance - 24 hour;SNF     Equipment Recommendations  None recommended by PT    Recommendations for Other Services       Precautions / Restrictions Precautions Precautions: Fall Precaution Comments: B BKA    Mobility  Bed Mobility Overal bed mobility: Needs Assistance Bed Mobility: Sidelying to Sit   Sidelying to sit: Min assist       General bed mobility comments: patient lying with head at foot of the bed, able to roll independently and needed assist to lift trunk upright  Transfers Overall transfer level: Needs assistance   Transfers: Sit to/from Stand Sit to Stand: Max assist;Mod assist;+2 physical assistance Stand pivot transfers: Max assist;+2 physical assistance       General transfer comment: donned prosthesis sitting edge of bed, but did not weight bear for transfer to chair,  Stood from chair with cues, and UE assist more weight through prosthesis with hands transitioned to walker and standing exercies about 30 sec  Ambulation/Gait                 Stairs            Wheelchair Mobility    Modified Rankin (Stroke Patients Only)       Balance     Sitting  balance-Leahy Scale: Fair Sitting balance - Comments: sitting edge of bed supervision while donning prosthesis   Standing balance support: Bilateral upper extremity supported Standing balance-Leahy Scale: Poor Standing balance comment: able to balance with walker and min assist                     Cognition Arousal/Alertness: Awake/alert Behavior During Therapy: WFL for tasks assessed/performed Overall Cognitive Status: No family/caregiver present to determine baseline cognitive functioning Area of Impairment: Safety/judgement   Current Attention Level: Selective     Safety/Judgement: Decreased awareness of safety     General Comments: More appropriate than last session, using humor to cover some deficits    Exercises Other Exercises Other Exercises: standing hip extension on left x 10     General Comments        Pertinent Vitals/Pain Faces Pain Scale: Hurts little more Pain Location: L leg Pain Descriptors / Indicators: Grimacing Pain Intervention(s): Monitored during session    Home Living                      Prior Function            PT Goals (current goals can now be found in the care plan section) Progress towards PT goals: Progressing toward goals    Frequency  Min 3X/week    PT Plan Current plan remains appropriate    Co-evaluation  End of Session Equipment Utilized During Treatment: Gait belt Activity Tolerance: Patient tolerated treatment well Patient left: in chair;with chair alarm set;with call bell/phone within reach     Time: 0917-0945 PT Time Calculation (min) (ACUTE ONLY): 28 min  Charges:  $Therapeutic Exercise: 8-22 mins $Therapeutic Activity: 8-22 mins                    G Codes:      WYNN,CYNDI June 21, 2015, 9:58 AM  Magda Kiel, PT [phone] 06/21/15

## 2015-05-24 NOTE — Progress Notes (Signed)
ANTIBIOTIC CONSULT NOTE   Pharmacy Consult for vancomycin and fortaz Indication: left foot gangrene  No Known Allergies  Patient Measurements: Height: 5\' 4"  (162.6 cm) Weight: 98 lb 8.7 oz (44.7 kg) IBW/kg (Calculated) : 59.2   Vital Signs: Temp: 98 F (36.7 C) (07/08 0551) Temp Source: Oral (07/08 0551) BP: 164/75 mmHg (07/08 0551) Pulse Rate: 93 (07/08 0551) Intake/Output from previous day: 07/07 0701 - 07/08 0700 In: -  Out: 1000  Intake/Output from this shift:    Labs:  Recent Labs  05/21/15 1459 05/22/15 0435 05/23/15 0240  WBC 11.8* 11.6* 13.3*  HGB 9.3* 9.3* 9.2*  PLT 357 310 356  CREATININE  --  4.77* 6.48*   Estimated Creatinine Clearance: 7.4 mL/min (by C-G formula based on Cr of 6.48).  Recent Labs  05/21/15 1033  [place] 13     Microbiology: Recent Results (from the past 720 hour(s))  Culture, blood (routine x 2)     Status: None   Collection Time: 05/14/15 11:22 AM  Result Value Ref Range Status   Specimen Description BLOOD LEFT ANTECUBITAL  Final   Special Requests BOTTLES DRAWN AEROBIC ONLY 3CC  Final   Culture NO GROWTH 5 DAYS  Final   Report Status 05/19/2015 FINAL  Final  Culture, blood (routine x 2)     Status: None   Collection Time: 05/14/15  3:36 PM  Result Value Ref Range Status   Specimen Description BLOOD LEFT ARM  Final   Special Requests BOTTLES DRAWN AEROBIC AND ANAEROBIC 5CC  Final   Culture NO GROWTH 5 DAYS  Final   Report Status 05/19/2015 FINAL  Final  Surgical pcr screen     Status: None   Collection Time: 05/15/15  8:06 PM  Result Value Ref Range Status   MRSA, PCR NEGATIVE NEGATIVE Final   Staphylococcus aureus NEGATIVE NEGATIVE Final    Comment:        The Xpert SA Assay (FDA approved for NASAL specimens in patients over 63 years of age), is one component of a comprehensive surveillance program.  Test performance has been validated by [facility] for patients greater than or equal to 58 year old. It is  not intended to diagnose infection nor to guide or monitor treatment.     Medical History: Past Medical History  Diagnosis Date  . Diabetes mellitus     Type 2. Diagnosed in mid 5s. Currently insulin requiring  . Hyperlipidemia   . ESRD (end stage renal disease) on dialysis     due to diabetic nephropathy. T/Th/Sat  . Tobacco abuse   . Diabetic neuropathy   . Diabetic retinopathy     legally blind  . Hypertension   . Dialysis patient   . Peripheral vascular disease   . Hx of right BKA    Assessment: 64 yo male left foot gangrene on vancomycin and fortaz. Patient s/p L BKA 6/30. No fevers noted, wbc = 11.6, end-stage renal disease on HD TThSa. Last HD 7/7 and plans for HD today then change to MWF HD next week. Noted plans for 2 weeks of antibiotics postoperative (end date should be ~ 05/30/15)  Zosyn 6/28>>7/2 Vanc 6/28>>     Pre-HD vanc level= 13 on 7/5 7/2 fortaz>>  6/28 bld x2 - neg  Goal of Therapy:  pre-HD vanc level 15-25 mcg/mL  Plan: -Continue fortaz 2gm with HD MWF - vancomycin 750 mg iv qHD MWF -Patient noted for SNF today and to get antibiotics at the HD center  Hildred Laser, Pharm D 05/24/2015 10:16 AM

## 2015-05-24 NOTE — Progress Notes (Addendum)
CSW (Clinical Education officer, museum) received call from Nephrology MD in regards to potential for dc today. CSW was notified pt will likely receive short dialysis session before discharge to SNF today. CSW spoke with [facility] admission who confirmed they are ready for pt at any time today. CSW prepared pt dc packet and placed with shadow chart. Pt nurse to call CSW when pt is done with dialysis and ready for transport to be arranged. CSW spoke with pt and notified. CSW unable to reach or leave voicemail for pt wife. Will try again when transport is arranged.  ADDENDUM: CSW arranged non-emergent ambulance. Pt nurse to notify family. CSW signing off.  Andrew, Py[place]

## 2015-05-27 DIAGNOSIS — E1129 Type 2 diabetes mellitus with other diabetic kidney complication: Secondary | ICD-10-CM | POA: Diagnosis not present

## 2015-05-27 DIAGNOSIS — E876 Hypokalemia: Secondary | ICD-10-CM | POA: Diagnosis not present

## 2015-05-27 DIAGNOSIS — D631 Anemia in chronic kidney disease: Secondary | ICD-10-CM | POA: Diagnosis not present

## 2015-05-27 DIAGNOSIS — I96 Gangrene, not elsewhere classified: Secondary | ICD-10-CM | POA: Diagnosis not present

## 2015-05-27 DIAGNOSIS — N186 End stage renal disease: Secondary | ICD-10-CM | POA: Diagnosis not present

## 2015-05-27 DIAGNOSIS — D509 Iron deficiency anemia, unspecified: Secondary | ICD-10-CM | POA: Diagnosis not present

## 2015-05-28 ENCOUNTER — Inpatient Hospital Stay: Admit: 2015-05-28 | Payer: Self-pay | Admitting: Vascular Surgery

## 2015-05-28 DIAGNOSIS — I96 Gangrene, not elsewhere classified: Secondary | ICD-10-CM | POA: Diagnosis not present

## 2015-05-28 DIAGNOSIS — G4700 Insomnia, unspecified: Secondary | ICD-10-CM | POA: Diagnosis not present

## 2015-05-28 DIAGNOSIS — N186 End stage renal disease: Secondary | ICD-10-CM | POA: Diagnosis not present

## 2015-05-28 DIAGNOSIS — K5901 Slow transit constipation: Secondary | ICD-10-CM | POA: Diagnosis not present

## 2015-05-28 SURGERY — AMPUTATION BELOW KNEE
Anesthesia: General | Laterality: Left

## 2015-05-29 DIAGNOSIS — E876 Hypokalemia: Secondary | ICD-10-CM | POA: Diagnosis not present

## 2015-05-29 DIAGNOSIS — D631 Anemia in chronic kidney disease: Secondary | ICD-10-CM | POA: Diagnosis not present

## 2015-05-29 DIAGNOSIS — I96 Gangrene, not elsewhere classified: Secondary | ICD-10-CM | POA: Diagnosis not present

## 2015-05-29 DIAGNOSIS — N186 End stage renal disease: Secondary | ICD-10-CM | POA: Diagnosis not present

## 2015-05-29 DIAGNOSIS — E1129 Type 2 diabetes mellitus with other diabetic kidney complication: Secondary | ICD-10-CM | POA: Diagnosis not present

## 2015-05-29 DIAGNOSIS — D509 Iron deficiency anemia, unspecified: Secondary | ICD-10-CM | POA: Diagnosis not present

## 2015-05-31 DIAGNOSIS — D509 Iron deficiency anemia, unspecified: Secondary | ICD-10-CM | POA: Diagnosis not present

## 2015-05-31 DIAGNOSIS — E1129 Type 2 diabetes mellitus with other diabetic kidney complication: Secondary | ICD-10-CM | POA: Diagnosis not present

## 2015-05-31 DIAGNOSIS — E876 Hypokalemia: Secondary | ICD-10-CM | POA: Diagnosis not present

## 2015-05-31 DIAGNOSIS — D631 Anemia in chronic kidney disease: Secondary | ICD-10-CM | POA: Diagnosis not present

## 2015-05-31 DIAGNOSIS — I96 Gangrene, not elsewhere classified: Secondary | ICD-10-CM | POA: Diagnosis not present

## 2015-05-31 DIAGNOSIS — N186 End stage renal disease: Secondary | ICD-10-CM | POA: Diagnosis not present

## 2015-06-01 DIAGNOSIS — I120 Hypertensive chronic kidney disease with stage 5 chronic kidney disease or end stage renal disease: Secondary | ICD-10-CM | POA: Diagnosis not present

## 2015-06-01 DIAGNOSIS — Z4781 Encounter for orthopedic aftercare following surgical amputation: Secondary | ICD-10-CM | POA: Diagnosis not present

## 2015-06-03 DIAGNOSIS — D631 Anemia in chronic kidney disease: Secondary | ICD-10-CM | POA: Diagnosis not present

## 2015-06-03 DIAGNOSIS — D509 Iron deficiency anemia, unspecified: Secondary | ICD-10-CM | POA: Diagnosis not present

## 2015-06-03 DIAGNOSIS — N186 End stage renal disease: Secondary | ICD-10-CM | POA: Diagnosis not present

## 2015-06-03 DIAGNOSIS — E1129 Type 2 diabetes mellitus with other diabetic kidney complication: Secondary | ICD-10-CM | POA: Diagnosis not present

## 2015-06-03 DIAGNOSIS — I96 Gangrene, not elsewhere classified: Secondary | ICD-10-CM | POA: Diagnosis not present

## 2015-06-03 DIAGNOSIS — E876 Hypokalemia: Secondary | ICD-10-CM | POA: Diagnosis not present

## 2015-06-04 DIAGNOSIS — Z4781 Encounter for orthopedic aftercare following surgical amputation: Secondary | ICD-10-CM | POA: Diagnosis not present

## 2015-06-04 DIAGNOSIS — I120 Hypertensive chronic kidney disease with stage 5 chronic kidney disease or end stage renal disease: Secondary | ICD-10-CM | POA: Diagnosis not present

## 2015-06-06 DIAGNOSIS — E876 Hypokalemia: Secondary | ICD-10-CM | POA: Diagnosis not present

## 2015-06-06 DIAGNOSIS — E1129 Type 2 diabetes mellitus with other diabetic kidney complication: Secondary | ICD-10-CM | POA: Diagnosis not present

## 2015-06-06 DIAGNOSIS — D631 Anemia in chronic kidney disease: Secondary | ICD-10-CM | POA: Diagnosis not present

## 2015-06-06 DIAGNOSIS — I96 Gangrene, not elsewhere classified: Secondary | ICD-10-CM | POA: Diagnosis not present

## 2015-06-06 DIAGNOSIS — D509 Iron deficiency anemia, unspecified: Secondary | ICD-10-CM | POA: Diagnosis not present

## 2015-06-06 DIAGNOSIS — N186 End stage renal disease: Secondary | ICD-10-CM | POA: Diagnosis not present

## 2015-06-07 DIAGNOSIS — I120 Hypertensive chronic kidney disease with stage 5 chronic kidney disease or end stage renal disease: Secondary | ICD-10-CM | POA: Diagnosis not present

## 2015-06-07 DIAGNOSIS — Z4781 Encounter for orthopedic aftercare following surgical amputation: Secondary | ICD-10-CM | POA: Diagnosis not present

## 2015-06-08 DIAGNOSIS — D631 Anemia in chronic kidney disease: Secondary | ICD-10-CM | POA: Diagnosis not present

## 2015-06-08 DIAGNOSIS — E1129 Type 2 diabetes mellitus with other diabetic kidney complication: Secondary | ICD-10-CM | POA: Diagnosis not present

## 2015-06-08 DIAGNOSIS — E876 Hypokalemia: Secondary | ICD-10-CM | POA: Diagnosis not present

## 2015-06-08 DIAGNOSIS — D509 Iron deficiency anemia, unspecified: Secondary | ICD-10-CM | POA: Diagnosis not present

## 2015-06-08 DIAGNOSIS — I96 Gangrene, not elsewhere classified: Secondary | ICD-10-CM | POA: Diagnosis not present

## 2015-06-08 DIAGNOSIS — N186 End stage renal disease: Secondary | ICD-10-CM | POA: Diagnosis not present

## 2015-06-10 DIAGNOSIS — I120 Hypertensive chronic kidney disease with stage 5 chronic kidney disease or end stage renal disease: Secondary | ICD-10-CM | POA: Diagnosis not present

## 2015-06-10 DIAGNOSIS — Z4781 Encounter for orthopedic aftercare following surgical amputation: Secondary | ICD-10-CM | POA: Diagnosis not present

## 2015-06-11 DIAGNOSIS — E876 Hypokalemia: Secondary | ICD-10-CM | POA: Diagnosis not present

## 2015-06-11 DIAGNOSIS — I96 Gangrene, not elsewhere classified: Secondary | ICD-10-CM | POA: Diagnosis not present

## 2015-06-11 DIAGNOSIS — D631 Anemia in chronic kidney disease: Secondary | ICD-10-CM | POA: Diagnosis not present

## 2015-06-11 DIAGNOSIS — N186 End stage renal disease: Secondary | ICD-10-CM | POA: Diagnosis not present

## 2015-06-11 DIAGNOSIS — D509 Iron deficiency anemia, unspecified: Secondary | ICD-10-CM | POA: Diagnosis not present

## 2015-06-11 DIAGNOSIS — E1129 Type 2 diabetes mellitus with other diabetic kidney complication: Secondary | ICD-10-CM | POA: Diagnosis not present

## 2015-06-12 DIAGNOSIS — Z4781 Encounter for orthopedic aftercare following surgical amputation: Secondary | ICD-10-CM | POA: Diagnosis not present

## 2015-06-12 DIAGNOSIS — I120 Hypertensive chronic kidney disease with stage 5 chronic kidney disease or end stage renal disease: Secondary | ICD-10-CM | POA: Diagnosis not present

## 2015-06-13 DIAGNOSIS — E1129 Type 2 diabetes mellitus with other diabetic kidney complication: Secondary | ICD-10-CM | POA: Diagnosis not present

## 2015-06-13 DIAGNOSIS — I96 Gangrene, not elsewhere classified: Secondary | ICD-10-CM | POA: Diagnosis not present

## 2015-06-13 DIAGNOSIS — E876 Hypokalemia: Secondary | ICD-10-CM | POA: Diagnosis not present

## 2015-06-13 DIAGNOSIS — D509 Iron deficiency anemia, unspecified: Secondary | ICD-10-CM | POA: Diagnosis not present

## 2015-06-13 DIAGNOSIS — D631 Anemia in chronic kidney disease: Secondary | ICD-10-CM | POA: Diagnosis not present

## 2015-06-13 DIAGNOSIS — N186 End stage renal disease: Secondary | ICD-10-CM | POA: Diagnosis not present

## 2015-06-14 DIAGNOSIS — I120 Hypertensive chronic kidney disease with stage 5 chronic kidney disease or end stage renal disease: Secondary | ICD-10-CM | POA: Diagnosis not present

## 2015-06-14 DIAGNOSIS — Z4781 Encounter for orthopedic aftercare following surgical amputation: Secondary | ICD-10-CM | POA: Diagnosis not present

## 2015-06-15 DIAGNOSIS — D509 Iron deficiency anemia, unspecified: Secondary | ICD-10-CM | POA: Diagnosis not present

## 2015-06-15 DIAGNOSIS — N186 End stage renal disease: Secondary | ICD-10-CM | POA: Diagnosis not present

## 2015-06-15 DIAGNOSIS — D631 Anemia in chronic kidney disease: Secondary | ICD-10-CM | POA: Diagnosis not present

## 2015-06-15 DIAGNOSIS — E1129 Type 2 diabetes mellitus with other diabetic kidney complication: Secondary | ICD-10-CM | POA: Diagnosis not present

## 2015-06-15 DIAGNOSIS — I96 Gangrene, not elsewhere classified: Secondary | ICD-10-CM | POA: Diagnosis not present

## 2015-06-15 DIAGNOSIS — E876 Hypokalemia: Secondary | ICD-10-CM | POA: Diagnosis not present

## 2015-06-16 DIAGNOSIS — E1129 Type 2 diabetes mellitus with other diabetic kidney complication: Secondary | ICD-10-CM | POA: Diagnosis not present

## 2015-06-16 DIAGNOSIS — Z992 Dependence on renal dialysis: Secondary | ICD-10-CM | POA: Diagnosis not present

## 2015-06-16 DIAGNOSIS — N186 End stage renal disease: Secondary | ICD-10-CM | POA: Diagnosis not present

## 2015-06-17 DIAGNOSIS — Z4781 Encounter for orthopedic aftercare following surgical amputation: Secondary | ICD-10-CM | POA: Diagnosis not present

## 2015-06-17 DIAGNOSIS — I120 Hypertensive chronic kidney disease with stage 5 chronic kidney disease or end stage renal disease: Secondary | ICD-10-CM | POA: Diagnosis not present

## 2015-06-18 DIAGNOSIS — E1129 Type 2 diabetes mellitus with other diabetic kidney complication: Secondary | ICD-10-CM | POA: Diagnosis not present

## 2015-06-18 DIAGNOSIS — N186 End stage renal disease: Secondary | ICD-10-CM | POA: Diagnosis not present

## 2015-06-18 DIAGNOSIS — D509 Iron deficiency anemia, unspecified: Secondary | ICD-10-CM | POA: Diagnosis not present

## 2015-06-18 DIAGNOSIS — D631 Anemia in chronic kidney disease: Secondary | ICD-10-CM | POA: Diagnosis not present

## 2015-06-19 DIAGNOSIS — Z4781 Encounter for orthopedic aftercare following surgical amputation: Secondary | ICD-10-CM | POA: Diagnosis not present

## 2015-06-19 DIAGNOSIS — I120 Hypertensive chronic kidney disease with stage 5 chronic kidney disease or end stage renal disease: Secondary | ICD-10-CM | POA: Diagnosis not present

## 2015-06-20 ENCOUNTER — Encounter: Payer: Self-pay | Admitting: Vascular Surgery

## 2015-06-20 DIAGNOSIS — N186 End stage renal disease: Secondary | ICD-10-CM | POA: Diagnosis not present

## 2015-06-20 DIAGNOSIS — D631 Anemia in chronic kidney disease: Secondary | ICD-10-CM | POA: Diagnosis not present

## 2015-06-20 DIAGNOSIS — E1129 Type 2 diabetes mellitus with other diabetic kidney complication: Secondary | ICD-10-CM | POA: Diagnosis not present

## 2015-06-20 DIAGNOSIS — D509 Iron deficiency anemia, unspecified: Secondary | ICD-10-CM | POA: Diagnosis not present

## 2015-06-21 ENCOUNTER — Ambulatory Visit (INDEPENDENT_AMBULATORY_CARE_PROVIDER_SITE_OTHER): Payer: Self-pay | Admitting: Vascular Surgery

## 2015-06-21 ENCOUNTER — Encounter: Payer: Self-pay | Admitting: Vascular Surgery

## 2015-06-21 VITALS — BP 112/54 | HR 61 | Temp 98.2°F | Resp 16 | Ht 64.25 in | Wt 105.0 lb

## 2015-06-21 DIAGNOSIS — G546 Phantom limb syndrome with pain: Secondary | ICD-10-CM

## 2015-06-21 DIAGNOSIS — Z89511 Acquired absence of right leg below knee: Secondary | ICD-10-CM

## 2015-06-21 DIAGNOSIS — Z89512 Acquired absence of left leg below knee: Secondary | ICD-10-CM

## 2015-06-21 NOTE — Progress Notes (Signed)
    Postoperative Visit   History of Present Illness  Garrett Salas is a 64 y.o. male who presents for postoperative follow-up for: L below-the-knee amputation (Date: 05/16/15).  The patient's wounds are healed.  The patient notes pain is well controlled.  The patient's current symptoms are: none.  For VQI Use Only  PRE-ADM LIVING: Home  AMB STATUS: Ambulatory  Physical Examination  Filed Vitals:   06/21/15 0947  BP: 112/54  Pulse: 61  Temp: 98.2 F (36.8 C)  Resp: 16   LLE: Incision is healed.  Staples are intact.  Medical Decision Making  Garrett Salas is a 64 y.o. male who presents s/p L below-the-knee ampuation.  The patient's stump is healing appropriately with resolution of pre-operative symptoms. I discussed in depth with the patient the nature of atherosclerosis, and emphasized the importance of maximal medical management including strict control of blood pressure, blood glucose, and lipid levels, obtaining regular exercise, and cessation of smoking.  The patient is aware that without maximal medical management the underlying atherosclerotic disease process will progress, possibly leading to a more proximal amputation. The patient agrees to participate in their maximal medical care.  Thank you for allowing [place] to participate in this patient's care.  The patient has been referred for prosthetic fitting.  The patient can follow up with [place] as needed.  Adele Barthel, MD [facility] Office: [phone] Pager: [phone]  06/21/2015, 10:01 AM

## 2015-06-22 DIAGNOSIS — D509 Iron deficiency anemia, unspecified: Secondary | ICD-10-CM | POA: Diagnosis not present

## 2015-06-22 DIAGNOSIS — E1129 Type 2 diabetes mellitus with other diabetic kidney complication: Secondary | ICD-10-CM | POA: Diagnosis not present

## 2015-06-22 DIAGNOSIS — N186 End stage renal disease: Secondary | ICD-10-CM | POA: Diagnosis not present

## 2015-06-22 DIAGNOSIS — D631 Anemia in chronic kidney disease: Secondary | ICD-10-CM | POA: Diagnosis not present

## 2015-06-24 DIAGNOSIS — Z4781 Encounter for orthopedic aftercare following surgical amputation: Secondary | ICD-10-CM | POA: Diagnosis not present

## 2015-06-24 DIAGNOSIS — I120 Hypertensive chronic kidney disease with stage 5 chronic kidney disease or end stage renal disease: Secondary | ICD-10-CM | POA: Diagnosis not present

## 2015-06-25 DIAGNOSIS — D631 Anemia in chronic kidney disease: Secondary | ICD-10-CM | POA: Diagnosis not present

## 2015-06-25 DIAGNOSIS — N186 End stage renal disease: Secondary | ICD-10-CM | POA: Diagnosis not present

## 2015-06-25 DIAGNOSIS — D509 Iron deficiency anemia, unspecified: Secondary | ICD-10-CM | POA: Diagnosis not present

## 2015-06-25 DIAGNOSIS — E1129 Type 2 diabetes mellitus with other diabetic kidney complication: Secondary | ICD-10-CM | POA: Diagnosis not present

## 2015-06-27 DIAGNOSIS — N186 End stage renal disease: Secondary | ICD-10-CM | POA: Diagnosis not present

## 2015-06-27 DIAGNOSIS — D631 Anemia in chronic kidney disease: Secondary | ICD-10-CM | POA: Diagnosis not present

## 2015-06-27 DIAGNOSIS — D509 Iron deficiency anemia, unspecified: Secondary | ICD-10-CM | POA: Diagnosis not present

## 2015-06-27 DIAGNOSIS — E1129 Type 2 diabetes mellitus with other diabetic kidney complication: Secondary | ICD-10-CM | POA: Diagnosis not present

## 2015-06-28 DIAGNOSIS — Z4781 Encounter for orthopedic aftercare following surgical amputation: Secondary | ICD-10-CM | POA: Diagnosis not present

## 2015-06-28 DIAGNOSIS — I120 Hypertensive chronic kidney disease with stage 5 chronic kidney disease or end stage renal disease: Secondary | ICD-10-CM | POA: Diagnosis not present

## 2015-06-29 DIAGNOSIS — D631 Anemia in chronic kidney disease: Secondary | ICD-10-CM | POA: Diagnosis not present

## 2015-06-29 DIAGNOSIS — D509 Iron deficiency anemia, unspecified: Secondary | ICD-10-CM | POA: Diagnosis not present

## 2015-06-29 DIAGNOSIS — E1129 Type 2 diabetes mellitus with other diabetic kidney complication: Secondary | ICD-10-CM | POA: Diagnosis not present

## 2015-06-29 DIAGNOSIS — N186 End stage renal disease: Secondary | ICD-10-CM | POA: Diagnosis not present

## 2015-07-02 DIAGNOSIS — D631 Anemia in chronic kidney disease: Secondary | ICD-10-CM | POA: Diagnosis not present

## 2015-07-02 DIAGNOSIS — N186 End stage renal disease: Secondary | ICD-10-CM | POA: Diagnosis not present

## 2015-07-02 DIAGNOSIS — E1129 Type 2 diabetes mellitus with other diabetic kidney complication: Secondary | ICD-10-CM | POA: Diagnosis not present

## 2015-07-02 DIAGNOSIS — D509 Iron deficiency anemia, unspecified: Secondary | ICD-10-CM | POA: Diagnosis not present

## 2015-07-03 DIAGNOSIS — I120 Hypertensive chronic kidney disease with stage 5 chronic kidney disease or end stage renal disease: Secondary | ICD-10-CM | POA: Diagnosis not present

## 2015-07-03 DIAGNOSIS — Z4781 Encounter for orthopedic aftercare following surgical amputation: Secondary | ICD-10-CM | POA: Diagnosis not present

## 2015-07-04 DIAGNOSIS — D631 Anemia in chronic kidney disease: Secondary | ICD-10-CM | POA: Diagnosis not present

## 2015-07-04 DIAGNOSIS — N186 End stage renal disease: Secondary | ICD-10-CM | POA: Diagnosis not present

## 2015-07-04 DIAGNOSIS — D509 Iron deficiency anemia, unspecified: Secondary | ICD-10-CM | POA: Diagnosis not present

## 2015-07-04 DIAGNOSIS — E1129 Type 2 diabetes mellitus with other diabetic kidney complication: Secondary | ICD-10-CM | POA: Diagnosis not present

## 2015-07-05 DIAGNOSIS — I509 Heart failure, unspecified: Secondary | ICD-10-CM | POA: Diagnosis not present

## 2015-07-05 DIAGNOSIS — N186 End stage renal disease: Secondary | ICD-10-CM | POA: Diagnosis not present

## 2015-07-05 DIAGNOSIS — I739 Peripheral vascular disease, unspecified: Secondary | ICD-10-CM | POA: Diagnosis not present

## 2015-07-05 DIAGNOSIS — J449 Chronic obstructive pulmonary disease, unspecified: Secondary | ICD-10-CM | POA: Diagnosis not present

## 2015-07-05 DIAGNOSIS — Z1389 Encounter for screening for other disorder: Secondary | ICD-10-CM | POA: Diagnosis not present

## 2015-07-05 DIAGNOSIS — Z681 Body mass index (BMI) 19 or less, adult: Secondary | ICD-10-CM | POA: Diagnosis not present

## 2015-07-05 DIAGNOSIS — E1121 Type 2 diabetes mellitus with diabetic nephropathy: Secondary | ICD-10-CM | POA: Diagnosis not present

## 2015-07-05 DIAGNOSIS — Z4781 Encounter for orthopedic aftercare following surgical amputation: Secondary | ICD-10-CM | POA: Diagnosis not present

## 2015-07-05 DIAGNOSIS — E785 Hyperlipidemia, unspecified: Secondary | ICD-10-CM | POA: Diagnosis not present

## 2015-07-05 DIAGNOSIS — I120 Hypertensive chronic kidney disease with stage 5 chronic kidney disease or end stage renal disease: Secondary | ICD-10-CM | POA: Diagnosis not present

## 2015-07-05 DIAGNOSIS — K219 Gastro-esophageal reflux disease without esophagitis: Secondary | ICD-10-CM | POA: Diagnosis not present

## 2015-07-06 DIAGNOSIS — D631 Anemia in chronic kidney disease: Secondary | ICD-10-CM | POA: Diagnosis not present

## 2015-07-06 DIAGNOSIS — N186 End stage renal disease: Secondary | ICD-10-CM | POA: Diagnosis not present

## 2015-07-06 DIAGNOSIS — D509 Iron deficiency anemia, unspecified: Secondary | ICD-10-CM | POA: Diagnosis not present

## 2015-07-06 DIAGNOSIS — E1129 Type 2 diabetes mellitus with other diabetic kidney complication: Secondary | ICD-10-CM | POA: Diagnosis not present

## 2015-07-09 ENCOUNTER — Telehealth: Payer: Self-pay | Admitting: *Deleted

## 2015-07-09 DIAGNOSIS — D509 Iron deficiency anemia, unspecified: Secondary | ICD-10-CM | POA: Diagnosis not present

## 2015-07-09 DIAGNOSIS — E1129 Type 2 diabetes mellitus with other diabetic kidney complication: Secondary | ICD-10-CM | POA: Diagnosis not present

## 2015-07-09 DIAGNOSIS — N186 End stage renal disease: Secondary | ICD-10-CM | POA: Diagnosis not present

## 2015-07-09 DIAGNOSIS — D631 Anemia in chronic kidney disease: Secondary | ICD-10-CM | POA: Diagnosis not present

## 2015-07-09 NOTE — Telephone Encounter (Signed)
[place] sent [place] a referral to see this patient back for a "dehiscence of his BKA". I spoke to Garrett Salas at the [facility] today and she says that she talked to Garrett Salas and he is refusing the appt to come back here. He says that his medical doctor, Dr. Megan Salon, is seeing him for this. He stated to Caryl Pina that he had left [place] because of cost and to be with his wife more. He reports that he has a Home Health nurse coming out to his home to do dressing changes. I told Caryl Pina that we would see him anytime for this, all they need to do is call us; they don't need to send in another referral packet.

## 2015-07-10 DIAGNOSIS — Z4781 Encounter for orthopedic aftercare following surgical amputation: Secondary | ICD-10-CM | POA: Diagnosis not present

## 2015-07-10 DIAGNOSIS — I120 Hypertensive chronic kidney disease with stage 5 chronic kidney disease or end stage renal disease: Secondary | ICD-10-CM | POA: Diagnosis not present

## 2015-07-11 DIAGNOSIS — D509 Iron deficiency anemia, unspecified: Secondary | ICD-10-CM | POA: Diagnosis not present

## 2015-07-11 DIAGNOSIS — N186 End stage renal disease: Secondary | ICD-10-CM | POA: Diagnosis not present

## 2015-07-11 DIAGNOSIS — D631 Anemia in chronic kidney disease: Secondary | ICD-10-CM | POA: Diagnosis not present

## 2015-07-11 DIAGNOSIS — E1129 Type 2 diabetes mellitus with other diabetic kidney complication: Secondary | ICD-10-CM | POA: Diagnosis not present

## 2015-07-13 DIAGNOSIS — N186 End stage renal disease: Secondary | ICD-10-CM | POA: Diagnosis not present

## 2015-07-13 DIAGNOSIS — D509 Iron deficiency anemia, unspecified: Secondary | ICD-10-CM | POA: Diagnosis not present

## 2015-07-13 DIAGNOSIS — E1129 Type 2 diabetes mellitus with other diabetic kidney complication: Secondary | ICD-10-CM | POA: Diagnosis not present

## 2015-07-13 DIAGNOSIS — D631 Anemia in chronic kidney disease: Secondary | ICD-10-CM | POA: Diagnosis not present

## 2015-07-16 DIAGNOSIS — D631 Anemia in chronic kidney disease: Secondary | ICD-10-CM | POA: Diagnosis not present

## 2015-07-16 DIAGNOSIS — D509 Iron deficiency anemia, unspecified: Secondary | ICD-10-CM | POA: Diagnosis not present

## 2015-07-16 DIAGNOSIS — N186 End stage renal disease: Secondary | ICD-10-CM | POA: Diagnosis not present

## 2015-07-16 DIAGNOSIS — E1129 Type 2 diabetes mellitus with other diabetic kidney complication: Secondary | ICD-10-CM | POA: Diagnosis not present

## 2015-07-17 DIAGNOSIS — N186 End stage renal disease: Secondary | ICD-10-CM | POA: Diagnosis not present

## 2015-07-17 DIAGNOSIS — E1129 Type 2 diabetes mellitus with other diabetic kidney complication: Secondary | ICD-10-CM | POA: Diagnosis not present

## 2015-07-17 DIAGNOSIS — Z992 Dependence on renal dialysis: Secondary | ICD-10-CM | POA: Diagnosis not present

## 2015-07-18 DIAGNOSIS — E1129 Type 2 diabetes mellitus with other diabetic kidney complication: Secondary | ICD-10-CM | POA: Diagnosis not present

## 2015-07-18 DIAGNOSIS — N186 End stage renal disease: Secondary | ICD-10-CM | POA: Diagnosis not present

## 2015-07-18 DIAGNOSIS — D509 Iron deficiency anemia, unspecified: Secondary | ICD-10-CM | POA: Diagnosis not present

## 2015-07-20 DIAGNOSIS — E1129 Type 2 diabetes mellitus with other diabetic kidney complication: Secondary | ICD-10-CM | POA: Diagnosis not present

## 2015-07-20 DIAGNOSIS — N186 End stage renal disease: Secondary | ICD-10-CM | POA: Diagnosis not present

## 2015-07-20 DIAGNOSIS — D509 Iron deficiency anemia, unspecified: Secondary | ICD-10-CM | POA: Diagnosis not present

## 2015-07-23 DIAGNOSIS — E1129 Type 2 diabetes mellitus with other diabetic kidney complication: Secondary | ICD-10-CM | POA: Diagnosis not present

## 2015-07-23 DIAGNOSIS — D509 Iron deficiency anemia, unspecified: Secondary | ICD-10-CM | POA: Diagnosis not present

## 2015-07-23 DIAGNOSIS — N186 End stage renal disease: Secondary | ICD-10-CM | POA: Diagnosis not present

## 2015-07-25 DIAGNOSIS — N186 End stage renal disease: Secondary | ICD-10-CM | POA: Diagnosis not present

## 2015-07-25 DIAGNOSIS — E1129 Type 2 diabetes mellitus with other diabetic kidney complication: Secondary | ICD-10-CM | POA: Diagnosis not present

## 2015-07-25 DIAGNOSIS — D509 Iron deficiency anemia, unspecified: Secondary | ICD-10-CM | POA: Diagnosis not present

## 2015-07-27 DIAGNOSIS — D509 Iron deficiency anemia, unspecified: Secondary | ICD-10-CM | POA: Diagnosis not present

## 2015-07-27 DIAGNOSIS — E1129 Type 2 diabetes mellitus with other diabetic kidney complication: Secondary | ICD-10-CM | POA: Diagnosis not present

## 2015-07-27 DIAGNOSIS — N186 End stage renal disease: Secondary | ICD-10-CM | POA: Diagnosis not present

## 2015-07-30 DIAGNOSIS — D509 Iron deficiency anemia, unspecified: Secondary | ICD-10-CM | POA: Diagnosis not present

## 2015-07-30 DIAGNOSIS — E1129 Type 2 diabetes mellitus with other diabetic kidney complication: Secondary | ICD-10-CM | POA: Diagnosis not present

## 2015-07-30 DIAGNOSIS — N186 End stage renal disease: Secondary | ICD-10-CM | POA: Diagnosis not present

## 2015-08-01 DIAGNOSIS — E1129 Type 2 diabetes mellitus with other diabetic kidney complication: Secondary | ICD-10-CM | POA: Diagnosis not present

## 2015-08-01 DIAGNOSIS — D509 Iron deficiency anemia, unspecified: Secondary | ICD-10-CM | POA: Diagnosis not present

## 2015-08-01 DIAGNOSIS — N186 End stage renal disease: Secondary | ICD-10-CM | POA: Diagnosis not present

## 2015-08-03 DIAGNOSIS — E1129 Type 2 diabetes mellitus with other diabetic kidney complication: Secondary | ICD-10-CM | POA: Diagnosis not present

## 2015-08-03 DIAGNOSIS — N186 End stage renal disease: Secondary | ICD-10-CM | POA: Diagnosis not present

## 2015-08-03 DIAGNOSIS — D509 Iron deficiency anemia, unspecified: Secondary | ICD-10-CM | POA: Diagnosis not present

## 2015-08-06 DIAGNOSIS — D509 Iron deficiency anemia, unspecified: Secondary | ICD-10-CM | POA: Diagnosis not present

## 2015-08-06 DIAGNOSIS — E1129 Type 2 diabetes mellitus with other diabetic kidney complication: Secondary | ICD-10-CM | POA: Diagnosis not present

## 2015-08-06 DIAGNOSIS — N186 End stage renal disease: Secondary | ICD-10-CM | POA: Diagnosis not present

## 2015-08-08 DIAGNOSIS — E1129 Type 2 diabetes mellitus with other diabetic kidney complication: Secondary | ICD-10-CM | POA: Diagnosis not present

## 2015-08-08 DIAGNOSIS — D509 Iron deficiency anemia, unspecified: Secondary | ICD-10-CM | POA: Diagnosis not present

## 2015-08-08 DIAGNOSIS — N186 End stage renal disease: Secondary | ICD-10-CM | POA: Diagnosis not present

## 2015-08-10 DIAGNOSIS — N186 End stage renal disease: Secondary | ICD-10-CM | POA: Diagnosis not present

## 2015-08-10 DIAGNOSIS — D509 Iron deficiency anemia, unspecified: Secondary | ICD-10-CM | POA: Diagnosis not present

## 2015-08-10 DIAGNOSIS — E1129 Type 2 diabetes mellitus with other diabetic kidney complication: Secondary | ICD-10-CM | POA: Diagnosis not present

## 2015-08-13 DIAGNOSIS — N186 End stage renal disease: Secondary | ICD-10-CM | POA: Diagnosis not present

## 2015-08-13 DIAGNOSIS — D509 Iron deficiency anemia, unspecified: Secondary | ICD-10-CM | POA: Diagnosis not present

## 2015-08-13 DIAGNOSIS — E1129 Type 2 diabetes mellitus with other diabetic kidney complication: Secondary | ICD-10-CM | POA: Diagnosis not present

## 2015-08-15 DIAGNOSIS — E1129 Type 2 diabetes mellitus with other diabetic kidney complication: Secondary | ICD-10-CM | POA: Diagnosis not present

## 2015-08-15 DIAGNOSIS — D509 Iron deficiency anemia, unspecified: Secondary | ICD-10-CM | POA: Diagnosis not present

## 2015-08-15 DIAGNOSIS — N186 End stage renal disease: Secondary | ICD-10-CM | POA: Diagnosis not present

## 2015-08-16 DIAGNOSIS — E1129 Type 2 diabetes mellitus with other diabetic kidney complication: Secondary | ICD-10-CM | POA: Diagnosis not present

## 2015-08-16 DIAGNOSIS — N186 End stage renal disease: Secondary | ICD-10-CM | POA: Diagnosis not present

## 2015-08-16 DIAGNOSIS — Z992 Dependence on renal dialysis: Secondary | ICD-10-CM | POA: Diagnosis not present

## 2015-08-17 DIAGNOSIS — N186 End stage renal disease: Secondary | ICD-10-CM | POA: Diagnosis not present

## 2015-08-17 DIAGNOSIS — E1129 Type 2 diabetes mellitus with other diabetic kidney complication: Secondary | ICD-10-CM | POA: Diagnosis not present

## 2015-08-17 DIAGNOSIS — Z23 Encounter for immunization: Secondary | ICD-10-CM | POA: Diagnosis not present

## 2015-08-20 DIAGNOSIS — Z23 Encounter for immunization: Secondary | ICD-10-CM | POA: Diagnosis not present

## 2015-08-20 DIAGNOSIS — N186 End stage renal disease: Secondary | ICD-10-CM | POA: Diagnosis not present

## 2015-08-20 DIAGNOSIS — E1129 Type 2 diabetes mellitus with other diabetic kidney complication: Secondary | ICD-10-CM | POA: Diagnosis not present

## 2015-08-22 DIAGNOSIS — E1129 Type 2 diabetes mellitus with other diabetic kidney complication: Secondary | ICD-10-CM | POA: Diagnosis not present

## 2015-08-22 DIAGNOSIS — N186 End stage renal disease: Secondary | ICD-10-CM | POA: Diagnosis not present

## 2015-08-22 DIAGNOSIS — Z23 Encounter for immunization: Secondary | ICD-10-CM | POA: Diagnosis not present

## 2015-08-24 DIAGNOSIS — Z23 Encounter for immunization: Secondary | ICD-10-CM | POA: Diagnosis not present

## 2015-08-24 DIAGNOSIS — E1129 Type 2 diabetes mellitus with other diabetic kidney complication: Secondary | ICD-10-CM | POA: Diagnosis not present

## 2015-08-24 DIAGNOSIS — N186 End stage renal disease: Secondary | ICD-10-CM | POA: Diagnosis not present

## 2015-08-27 DIAGNOSIS — N186 End stage renal disease: Secondary | ICD-10-CM | POA: Diagnosis not present

## 2015-08-27 DIAGNOSIS — Z23 Encounter for immunization: Secondary | ICD-10-CM | POA: Diagnosis not present

## 2015-08-27 DIAGNOSIS — E1129 Type 2 diabetes mellitus with other diabetic kidney complication: Secondary | ICD-10-CM | POA: Diagnosis not present

## 2015-08-29 DIAGNOSIS — Z23 Encounter for immunization: Secondary | ICD-10-CM | POA: Diagnosis not present

## 2015-08-29 DIAGNOSIS — N186 End stage renal disease: Secondary | ICD-10-CM | POA: Diagnosis not present

## 2015-08-29 DIAGNOSIS — E1129 Type 2 diabetes mellitus with other diabetic kidney complication: Secondary | ICD-10-CM | POA: Diagnosis not present

## 2015-08-31 DIAGNOSIS — N186 End stage renal disease: Secondary | ICD-10-CM | POA: Diagnosis not present

## 2015-08-31 DIAGNOSIS — Z23 Encounter for immunization: Secondary | ICD-10-CM | POA: Diagnosis not present

## 2015-08-31 DIAGNOSIS — E1129 Type 2 diabetes mellitus with other diabetic kidney complication: Secondary | ICD-10-CM | POA: Diagnosis not present

## 2015-09-03 DIAGNOSIS — Z23 Encounter for immunization: Secondary | ICD-10-CM | POA: Diagnosis not present

## 2015-09-03 DIAGNOSIS — E1129 Type 2 diabetes mellitus with other diabetic kidney complication: Secondary | ICD-10-CM | POA: Diagnosis not present

## 2015-09-03 DIAGNOSIS — N186 End stage renal disease: Secondary | ICD-10-CM | POA: Diagnosis not present

## 2015-09-05 DIAGNOSIS — Z23 Encounter for immunization: Secondary | ICD-10-CM | POA: Diagnosis not present

## 2015-09-05 DIAGNOSIS — N186 End stage renal disease: Secondary | ICD-10-CM | POA: Diagnosis not present

## 2015-09-05 DIAGNOSIS — E1129 Type 2 diabetes mellitus with other diabetic kidney complication: Secondary | ICD-10-CM | POA: Diagnosis not present

## 2015-09-07 DIAGNOSIS — E1129 Type 2 diabetes mellitus with other diabetic kidney complication: Secondary | ICD-10-CM | POA: Diagnosis not present

## 2015-09-07 DIAGNOSIS — Z23 Encounter for immunization: Secondary | ICD-10-CM | POA: Diagnosis not present

## 2015-09-07 DIAGNOSIS — N186 End stage renal disease: Secondary | ICD-10-CM | POA: Diagnosis not present

## 2015-09-10 DIAGNOSIS — E1129 Type 2 diabetes mellitus with other diabetic kidney complication: Secondary | ICD-10-CM | POA: Diagnosis not present

## 2015-09-10 DIAGNOSIS — Z23 Encounter for immunization: Secondary | ICD-10-CM | POA: Diagnosis not present

## 2015-09-10 DIAGNOSIS — N186 End stage renal disease: Secondary | ICD-10-CM | POA: Diagnosis not present

## 2015-09-12 DIAGNOSIS — N186 End stage renal disease: Secondary | ICD-10-CM | POA: Diagnosis not present

## 2015-09-12 DIAGNOSIS — E1129 Type 2 diabetes mellitus with other diabetic kidney complication: Secondary | ICD-10-CM | POA: Diagnosis not present

## 2015-09-12 DIAGNOSIS — Z23 Encounter for immunization: Secondary | ICD-10-CM | POA: Diagnosis not present

## 2015-09-14 DIAGNOSIS — E1129 Type 2 diabetes mellitus with other diabetic kidney complication: Secondary | ICD-10-CM | POA: Diagnosis not present

## 2015-09-14 DIAGNOSIS — N186 End stage renal disease: Secondary | ICD-10-CM | POA: Diagnosis not present

## 2015-09-14 DIAGNOSIS — Z23 Encounter for immunization: Secondary | ICD-10-CM | POA: Diagnosis not present

## 2015-09-16 DIAGNOSIS — Z992 Dependence on renal dialysis: Secondary | ICD-10-CM | POA: Diagnosis not present

## 2015-09-16 DIAGNOSIS — E1129 Type 2 diabetes mellitus with other diabetic kidney complication: Secondary | ICD-10-CM | POA: Diagnosis not present

## 2015-09-16 DIAGNOSIS — N186 End stage renal disease: Secondary | ICD-10-CM | POA: Diagnosis not present

## 2015-09-17 DIAGNOSIS — Z23 Encounter for immunization: Secondary | ICD-10-CM | POA: Diagnosis not present

## 2015-09-17 DIAGNOSIS — E1129 Type 2 diabetes mellitus with other diabetic kidney complication: Secondary | ICD-10-CM | POA: Diagnosis not present

## 2015-09-17 DIAGNOSIS — N186 End stage renal disease: Secondary | ICD-10-CM | POA: Diagnosis not present

## 2015-09-17 DIAGNOSIS — D631 Anemia in chronic kidney disease: Secondary | ICD-10-CM | POA: Diagnosis not present

## 2015-09-19 DIAGNOSIS — Z23 Encounter for immunization: Secondary | ICD-10-CM | POA: Diagnosis not present

## 2015-09-19 DIAGNOSIS — D631 Anemia in chronic kidney disease: Secondary | ICD-10-CM | POA: Diagnosis not present

## 2015-09-19 DIAGNOSIS — N186 End stage renal disease: Secondary | ICD-10-CM | POA: Diagnosis not present

## 2015-09-19 DIAGNOSIS — E1129 Type 2 diabetes mellitus with other diabetic kidney complication: Secondary | ICD-10-CM | POA: Diagnosis not present

## 2015-09-21 DIAGNOSIS — N186 End stage renal disease: Secondary | ICD-10-CM | POA: Diagnosis not present

## 2015-09-21 DIAGNOSIS — E1129 Type 2 diabetes mellitus with other diabetic kidney complication: Secondary | ICD-10-CM | POA: Diagnosis not present

## 2015-09-21 DIAGNOSIS — D631 Anemia in chronic kidney disease: Secondary | ICD-10-CM | POA: Diagnosis not present

## 2015-09-21 DIAGNOSIS — Z23 Encounter for immunization: Secondary | ICD-10-CM | POA: Diagnosis not present

## 2015-09-24 DIAGNOSIS — Z23 Encounter for immunization: Secondary | ICD-10-CM | POA: Diagnosis not present

## 2015-09-24 DIAGNOSIS — D631 Anemia in chronic kidney disease: Secondary | ICD-10-CM | POA: Diagnosis not present

## 2015-09-24 DIAGNOSIS — E1129 Type 2 diabetes mellitus with other diabetic kidney complication: Secondary | ICD-10-CM | POA: Diagnosis not present

## 2015-09-24 DIAGNOSIS — N186 End stage renal disease: Secondary | ICD-10-CM | POA: Diagnosis not present

## 2015-09-26 DIAGNOSIS — N186 End stage renal disease: Secondary | ICD-10-CM | POA: Diagnosis not present

## 2015-09-26 DIAGNOSIS — D631 Anemia in chronic kidney disease: Secondary | ICD-10-CM | POA: Diagnosis not present

## 2015-09-26 DIAGNOSIS — Z23 Encounter for immunization: Secondary | ICD-10-CM | POA: Diagnosis not present

## 2015-09-26 DIAGNOSIS — E1129 Type 2 diabetes mellitus with other diabetic kidney complication: Secondary | ICD-10-CM | POA: Diagnosis not present

## 2015-09-28 DIAGNOSIS — Z23 Encounter for immunization: Secondary | ICD-10-CM | POA: Diagnosis not present

## 2015-09-28 DIAGNOSIS — E1129 Type 2 diabetes mellitus with other diabetic kidney complication: Secondary | ICD-10-CM | POA: Diagnosis not present

## 2015-09-28 DIAGNOSIS — D631 Anemia in chronic kidney disease: Secondary | ICD-10-CM | POA: Diagnosis not present

## 2015-09-28 DIAGNOSIS — N186 End stage renal disease: Secondary | ICD-10-CM | POA: Diagnosis not present

## 2015-10-01 DIAGNOSIS — Z23 Encounter for immunization: Secondary | ICD-10-CM | POA: Diagnosis not present

## 2015-10-01 DIAGNOSIS — D631 Anemia in chronic kidney disease: Secondary | ICD-10-CM | POA: Diagnosis not present

## 2015-10-01 DIAGNOSIS — E1129 Type 2 diabetes mellitus with other diabetic kidney complication: Secondary | ICD-10-CM | POA: Diagnosis not present

## 2015-10-01 DIAGNOSIS — N186 End stage renal disease: Secondary | ICD-10-CM | POA: Diagnosis not present

## 2015-10-03 DIAGNOSIS — D631 Anemia in chronic kidney disease: Secondary | ICD-10-CM | POA: Diagnosis not present

## 2015-10-03 DIAGNOSIS — E1129 Type 2 diabetes mellitus with other diabetic kidney complication: Secondary | ICD-10-CM | POA: Diagnosis not present

## 2015-10-03 DIAGNOSIS — N186 End stage renal disease: Secondary | ICD-10-CM | POA: Diagnosis not present

## 2015-10-03 DIAGNOSIS — Z23 Encounter for immunization: Secondary | ICD-10-CM | POA: Diagnosis not present

## 2015-10-05 DIAGNOSIS — E1129 Type 2 diabetes mellitus with other diabetic kidney complication: Secondary | ICD-10-CM | POA: Diagnosis not present

## 2015-10-05 DIAGNOSIS — N186 End stage renal disease: Secondary | ICD-10-CM | POA: Diagnosis not present

## 2015-10-05 DIAGNOSIS — Z23 Encounter for immunization: Secondary | ICD-10-CM | POA: Diagnosis not present

## 2015-10-05 DIAGNOSIS — D631 Anemia in chronic kidney disease: Secondary | ICD-10-CM | POA: Diagnosis not present

## 2015-10-08 DIAGNOSIS — N186 End stage renal disease: Secondary | ICD-10-CM | POA: Diagnosis not present

## 2015-10-08 DIAGNOSIS — E1129 Type 2 diabetes mellitus with other diabetic kidney complication: Secondary | ICD-10-CM | POA: Diagnosis not present

## 2015-10-08 DIAGNOSIS — Z23 Encounter for immunization: Secondary | ICD-10-CM | POA: Diagnosis not present

## 2015-10-08 DIAGNOSIS — D631 Anemia in chronic kidney disease: Secondary | ICD-10-CM | POA: Diagnosis not present

## 2015-10-09 DIAGNOSIS — N186 End stage renal disease: Secondary | ICD-10-CM | POA: Diagnosis not present

## 2015-10-09 DIAGNOSIS — D631 Anemia in chronic kidney disease: Secondary | ICD-10-CM | POA: Diagnosis not present

## 2015-10-09 DIAGNOSIS — Z23 Encounter for immunization: Secondary | ICD-10-CM | POA: Diagnosis not present

## 2015-10-09 DIAGNOSIS — E1129 Type 2 diabetes mellitus with other diabetic kidney complication: Secondary | ICD-10-CM | POA: Diagnosis not present

## 2015-10-12 DIAGNOSIS — D631 Anemia in chronic kidney disease: Secondary | ICD-10-CM | POA: Diagnosis not present

## 2015-10-12 DIAGNOSIS — E1129 Type 2 diabetes mellitus with other diabetic kidney complication: Secondary | ICD-10-CM | POA: Diagnosis not present

## 2015-10-12 DIAGNOSIS — N186 End stage renal disease: Secondary | ICD-10-CM | POA: Diagnosis not present

## 2015-10-12 DIAGNOSIS — Z23 Encounter for immunization: Secondary | ICD-10-CM | POA: Diagnosis not present

## 2015-10-15 DIAGNOSIS — E1129 Type 2 diabetes mellitus with other diabetic kidney complication: Secondary | ICD-10-CM | POA: Diagnosis not present

## 2015-10-15 DIAGNOSIS — Z23 Encounter for immunization: Secondary | ICD-10-CM | POA: Diagnosis not present

## 2015-10-15 DIAGNOSIS — D631 Anemia in chronic kidney disease: Secondary | ICD-10-CM | POA: Diagnosis not present

## 2015-10-15 DIAGNOSIS — N186 End stage renal disease: Secondary | ICD-10-CM | POA: Diagnosis not present

## 2015-10-16 DIAGNOSIS — Z992 Dependence on renal dialysis: Secondary | ICD-10-CM | POA: Diagnosis not present

## 2015-10-16 DIAGNOSIS — E1129 Type 2 diabetes mellitus with other diabetic kidney complication: Secondary | ICD-10-CM | POA: Diagnosis not present

## 2015-10-16 DIAGNOSIS — N186 End stage renal disease: Secondary | ICD-10-CM | POA: Diagnosis not present

## 2015-10-17 DIAGNOSIS — D509 Iron deficiency anemia, unspecified: Secondary | ICD-10-CM | POA: Diagnosis not present

## 2015-10-17 DIAGNOSIS — D631 Anemia in chronic kidney disease: Secondary | ICD-10-CM | POA: Diagnosis not present

## 2015-10-17 DIAGNOSIS — E1129 Type 2 diabetes mellitus with other diabetic kidney complication: Secondary | ICD-10-CM | POA: Diagnosis not present

## 2015-10-17 DIAGNOSIS — N186 End stage renal disease: Secondary | ICD-10-CM | POA: Diagnosis not present

## 2015-10-19 DIAGNOSIS — D509 Iron deficiency anemia, unspecified: Secondary | ICD-10-CM | POA: Diagnosis not present

## 2015-10-19 DIAGNOSIS — E1129 Type 2 diabetes mellitus with other diabetic kidney complication: Secondary | ICD-10-CM | POA: Diagnosis not present

## 2015-10-19 DIAGNOSIS — N186 End stage renal disease: Secondary | ICD-10-CM | POA: Diagnosis not present

## 2015-10-19 DIAGNOSIS — D631 Anemia in chronic kidney disease: Secondary | ICD-10-CM | POA: Diagnosis not present

## 2015-10-22 DIAGNOSIS — D509 Iron deficiency anemia, unspecified: Secondary | ICD-10-CM | POA: Diagnosis not present

## 2015-10-22 DIAGNOSIS — E1129 Type 2 diabetes mellitus with other diabetic kidney complication: Secondary | ICD-10-CM | POA: Diagnosis not present

## 2015-10-22 DIAGNOSIS — N186 End stage renal disease: Secondary | ICD-10-CM | POA: Diagnosis not present

## 2015-10-22 DIAGNOSIS — D631 Anemia in chronic kidney disease: Secondary | ICD-10-CM | POA: Diagnosis not present

## 2015-10-24 DIAGNOSIS — N186 End stage renal disease: Secondary | ICD-10-CM | POA: Diagnosis not present

## 2015-10-24 DIAGNOSIS — D631 Anemia in chronic kidney disease: Secondary | ICD-10-CM | POA: Diagnosis not present

## 2015-10-24 DIAGNOSIS — D509 Iron deficiency anemia, unspecified: Secondary | ICD-10-CM | POA: Diagnosis not present

## 2015-10-24 DIAGNOSIS — E1129 Type 2 diabetes mellitus with other diabetic kidney complication: Secondary | ICD-10-CM | POA: Diagnosis not present

## 2015-10-26 DIAGNOSIS — D509 Iron deficiency anemia, unspecified: Secondary | ICD-10-CM | POA: Diagnosis not present

## 2015-10-26 DIAGNOSIS — E1129 Type 2 diabetes mellitus with other diabetic kidney complication: Secondary | ICD-10-CM | POA: Diagnosis not present

## 2015-10-26 DIAGNOSIS — D631 Anemia in chronic kidney disease: Secondary | ICD-10-CM | POA: Diagnosis not present

## 2015-10-26 DIAGNOSIS — N186 End stage renal disease: Secondary | ICD-10-CM | POA: Diagnosis not present

## 2015-10-29 DIAGNOSIS — D509 Iron deficiency anemia, unspecified: Secondary | ICD-10-CM | POA: Diagnosis not present

## 2015-10-29 DIAGNOSIS — E1129 Type 2 diabetes mellitus with other diabetic kidney complication: Secondary | ICD-10-CM | POA: Diagnosis not present

## 2015-10-29 DIAGNOSIS — N186 End stage renal disease: Secondary | ICD-10-CM | POA: Diagnosis not present

## 2015-10-29 DIAGNOSIS — D631 Anemia in chronic kidney disease: Secondary | ICD-10-CM | POA: Diagnosis not present

## 2015-10-30 DIAGNOSIS — E1151 Type 2 diabetes mellitus with diabetic peripheral angiopathy without gangrene: Secondary | ICD-10-CM | POA: Diagnosis not present

## 2015-10-30 DIAGNOSIS — R32 Unspecified urinary incontinence: Secondary | ICD-10-CM | POA: Diagnosis not present

## 2015-10-30 DIAGNOSIS — E11319 Type 2 diabetes mellitus with unspecified diabetic retinopathy without macular edema: Secondary | ICD-10-CM | POA: Diagnosis not present

## 2015-10-30 DIAGNOSIS — Z720 Tobacco use: Secondary | ICD-10-CM | POA: Diagnosis not present

## 2015-10-30 DIAGNOSIS — R2681 Unsteadiness on feet: Secondary | ICD-10-CM | POA: Diagnosis not present

## 2015-10-30 DIAGNOSIS — E1122 Type 2 diabetes mellitus with diabetic chronic kidney disease: Secondary | ICD-10-CM | POA: Diagnosis not present

## 2015-10-30 DIAGNOSIS — Z89511 Acquired absence of right leg below knee: Secondary | ICD-10-CM | POA: Diagnosis not present

## 2015-10-30 DIAGNOSIS — N186 End stage renal disease: Secondary | ICD-10-CM | POA: Diagnosis not present

## 2015-10-30 DIAGNOSIS — Z89512 Acquired absence of left leg below knee: Secondary | ICD-10-CM | POA: Diagnosis not present

## 2015-10-30 DIAGNOSIS — I120 Hypertensive chronic kidney disease with stage 5 chronic kidney disease or end stage renal disease: Secondary | ICD-10-CM | POA: Diagnosis not present

## 2015-10-30 DIAGNOSIS — J449 Chronic obstructive pulmonary disease, unspecified: Secondary | ICD-10-CM | POA: Diagnosis not present

## 2015-10-30 DIAGNOSIS — Z992 Dependence on renal dialysis: Secondary | ICD-10-CM | POA: Diagnosis not present

## 2015-10-30 DIAGNOSIS — Z794 Long term (current) use of insulin: Secondary | ICD-10-CM | POA: Diagnosis not present

## 2015-10-30 DIAGNOSIS — E1140 Type 2 diabetes mellitus with diabetic neuropathy, unspecified: Secondary | ICD-10-CM | POA: Diagnosis not present

## 2015-10-30 DIAGNOSIS — E1143 Type 2 diabetes mellitus with diabetic autonomic (poly)neuropathy: Secondary | ICD-10-CM | POA: Diagnosis not present

## 2015-10-30 DIAGNOSIS — E1165 Type 2 diabetes mellitus with hyperglycemia: Secondary | ICD-10-CM | POA: Diagnosis not present

## 2015-10-31 DIAGNOSIS — E1129 Type 2 diabetes mellitus with other diabetic kidney complication: Secondary | ICD-10-CM | POA: Diagnosis not present

## 2015-10-31 DIAGNOSIS — D631 Anemia in chronic kidney disease: Secondary | ICD-10-CM | POA: Diagnosis not present

## 2015-10-31 DIAGNOSIS — N186 End stage renal disease: Secondary | ICD-10-CM | POA: Diagnosis not present

## 2015-10-31 DIAGNOSIS — D509 Iron deficiency anemia, unspecified: Secondary | ICD-10-CM | POA: Diagnosis not present

## 2015-11-01 DIAGNOSIS — E1151 Type 2 diabetes mellitus with diabetic peripheral angiopathy without gangrene: Secondary | ICD-10-CM | POA: Diagnosis not present

## 2015-11-01 DIAGNOSIS — R2681 Unsteadiness on feet: Secondary | ICD-10-CM | POA: Diagnosis not present

## 2015-11-01 DIAGNOSIS — E1165 Type 2 diabetes mellitus with hyperglycemia: Secondary | ICD-10-CM | POA: Diagnosis not present

## 2015-11-01 DIAGNOSIS — J449 Chronic obstructive pulmonary disease, unspecified: Secondary | ICD-10-CM | POA: Diagnosis not present

## 2015-11-01 DIAGNOSIS — E11319 Type 2 diabetes mellitus with unspecified diabetic retinopathy without macular edema: Secondary | ICD-10-CM | POA: Diagnosis not present

## 2015-11-01 DIAGNOSIS — E1140 Type 2 diabetes mellitus with diabetic neuropathy, unspecified: Secondary | ICD-10-CM | POA: Diagnosis not present

## 2015-11-02 DIAGNOSIS — N186 End stage renal disease: Secondary | ICD-10-CM | POA: Diagnosis not present

## 2015-11-02 DIAGNOSIS — D631 Anemia in chronic kidney disease: Secondary | ICD-10-CM | POA: Diagnosis not present

## 2015-11-02 DIAGNOSIS — E1129 Type 2 diabetes mellitus with other diabetic kidney complication: Secondary | ICD-10-CM | POA: Diagnosis not present

## 2015-11-02 DIAGNOSIS — D509 Iron deficiency anemia, unspecified: Secondary | ICD-10-CM | POA: Diagnosis not present

## 2015-11-04 DIAGNOSIS — E11319 Type 2 diabetes mellitus with unspecified diabetic retinopathy without macular edema: Secondary | ICD-10-CM | POA: Diagnosis not present

## 2015-11-04 DIAGNOSIS — E1140 Type 2 diabetes mellitus with diabetic neuropathy, unspecified: Secondary | ICD-10-CM | POA: Diagnosis not present

## 2015-11-04 DIAGNOSIS — R2681 Unsteadiness on feet: Secondary | ICD-10-CM | POA: Diagnosis not present

## 2015-11-04 DIAGNOSIS — E1151 Type 2 diabetes mellitus with diabetic peripheral angiopathy without gangrene: Secondary | ICD-10-CM | POA: Diagnosis not present

## 2015-11-04 DIAGNOSIS — E1165 Type 2 diabetes mellitus with hyperglycemia: Secondary | ICD-10-CM | POA: Diagnosis not present

## 2015-11-04 DIAGNOSIS — J449 Chronic obstructive pulmonary disease, unspecified: Secondary | ICD-10-CM | POA: Diagnosis not present

## 2015-11-05 DIAGNOSIS — E1129 Type 2 diabetes mellitus with other diabetic kidney complication: Secondary | ICD-10-CM | POA: Diagnosis not present

## 2015-11-05 DIAGNOSIS — D631 Anemia in chronic kidney disease: Secondary | ICD-10-CM | POA: Diagnosis not present

## 2015-11-05 DIAGNOSIS — N186 End stage renal disease: Secondary | ICD-10-CM | POA: Diagnosis not present

## 2015-11-05 DIAGNOSIS — D509 Iron deficiency anemia, unspecified: Secondary | ICD-10-CM | POA: Diagnosis not present

## 2015-11-06 DIAGNOSIS — J449 Chronic obstructive pulmonary disease, unspecified: Secondary | ICD-10-CM | POA: Diagnosis not present

## 2015-11-06 DIAGNOSIS — E1165 Type 2 diabetes mellitus with hyperglycemia: Secondary | ICD-10-CM | POA: Diagnosis not present

## 2015-11-06 DIAGNOSIS — R2681 Unsteadiness on feet: Secondary | ICD-10-CM | POA: Diagnosis not present

## 2015-11-06 DIAGNOSIS — E1140 Type 2 diabetes mellitus with diabetic neuropathy, unspecified: Secondary | ICD-10-CM | POA: Diagnosis not present

## 2015-11-06 DIAGNOSIS — E11319 Type 2 diabetes mellitus with unspecified diabetic retinopathy without macular edema: Secondary | ICD-10-CM | POA: Diagnosis not present

## 2015-11-06 DIAGNOSIS — E1151 Type 2 diabetes mellitus with diabetic peripheral angiopathy without gangrene: Secondary | ICD-10-CM | POA: Diagnosis not present

## 2015-11-07 DIAGNOSIS — D509 Iron deficiency anemia, unspecified: Secondary | ICD-10-CM | POA: Diagnosis not present

## 2015-11-07 DIAGNOSIS — E1129 Type 2 diabetes mellitus with other diabetic kidney complication: Secondary | ICD-10-CM | POA: Diagnosis not present

## 2015-11-07 DIAGNOSIS — N186 End stage renal disease: Secondary | ICD-10-CM | POA: Diagnosis not present

## 2015-11-07 DIAGNOSIS — D631 Anemia in chronic kidney disease: Secondary | ICD-10-CM | POA: Diagnosis not present

## 2015-11-09 DIAGNOSIS — N186 End stage renal disease: Secondary | ICD-10-CM | POA: Diagnosis not present

## 2015-11-09 DIAGNOSIS — E1129 Type 2 diabetes mellitus with other diabetic kidney complication: Secondary | ICD-10-CM | POA: Diagnosis not present

## 2015-11-09 DIAGNOSIS — D631 Anemia in chronic kidney disease: Secondary | ICD-10-CM | POA: Diagnosis not present

## 2015-11-09 DIAGNOSIS — D509 Iron deficiency anemia, unspecified: Secondary | ICD-10-CM | POA: Diagnosis not present

## 2015-11-11 DIAGNOSIS — E1151 Type 2 diabetes mellitus with diabetic peripheral angiopathy without gangrene: Secondary | ICD-10-CM | POA: Diagnosis not present

## 2015-11-11 DIAGNOSIS — J449 Chronic obstructive pulmonary disease, unspecified: Secondary | ICD-10-CM | POA: Diagnosis not present

## 2015-11-11 DIAGNOSIS — R2681 Unsteadiness on feet: Secondary | ICD-10-CM | POA: Diagnosis not present

## 2015-11-11 DIAGNOSIS — E1140 Type 2 diabetes mellitus with diabetic neuropathy, unspecified: Secondary | ICD-10-CM | POA: Diagnosis not present

## 2015-11-11 DIAGNOSIS — E1165 Type 2 diabetes mellitus with hyperglycemia: Secondary | ICD-10-CM | POA: Diagnosis not present

## 2015-11-11 DIAGNOSIS — E11319 Type 2 diabetes mellitus with unspecified diabetic retinopathy without macular edema: Secondary | ICD-10-CM | POA: Diagnosis not present

## 2015-11-12 DIAGNOSIS — D631 Anemia in chronic kidney disease: Secondary | ICD-10-CM | POA: Diagnosis not present

## 2015-11-12 DIAGNOSIS — D509 Iron deficiency anemia, unspecified: Secondary | ICD-10-CM | POA: Diagnosis not present

## 2015-11-12 DIAGNOSIS — N186 End stage renal disease: Secondary | ICD-10-CM | POA: Diagnosis not present

## 2015-11-12 DIAGNOSIS — E1129 Type 2 diabetes mellitus with other diabetic kidney complication: Secondary | ICD-10-CM | POA: Diagnosis not present

## 2015-11-13 DIAGNOSIS — J449 Chronic obstructive pulmonary disease, unspecified: Secondary | ICD-10-CM | POA: Diagnosis not present

## 2015-11-13 DIAGNOSIS — E1140 Type 2 diabetes mellitus with diabetic neuropathy, unspecified: Secondary | ICD-10-CM | POA: Diagnosis not present

## 2015-11-13 DIAGNOSIS — E1165 Type 2 diabetes mellitus with hyperglycemia: Secondary | ICD-10-CM | POA: Diagnosis not present

## 2015-11-13 DIAGNOSIS — R2681 Unsteadiness on feet: Secondary | ICD-10-CM | POA: Diagnosis not present

## 2015-11-13 DIAGNOSIS — E11319 Type 2 diabetes mellitus with unspecified diabetic retinopathy without macular edema: Secondary | ICD-10-CM | POA: Diagnosis not present

## 2015-11-13 DIAGNOSIS — E1151 Type 2 diabetes mellitus with diabetic peripheral angiopathy without gangrene: Secondary | ICD-10-CM | POA: Diagnosis not present

## 2015-11-14 DIAGNOSIS — N186 End stage renal disease: Secondary | ICD-10-CM | POA: Diagnosis not present

## 2015-11-14 DIAGNOSIS — E1129 Type 2 diabetes mellitus with other diabetic kidney complication: Secondary | ICD-10-CM | POA: Diagnosis not present

## 2015-11-14 DIAGNOSIS — D631 Anemia in chronic kidney disease: Secondary | ICD-10-CM | POA: Diagnosis not present

## 2015-11-14 DIAGNOSIS — D509 Iron deficiency anemia, unspecified: Secondary | ICD-10-CM | POA: Diagnosis not present

## 2015-11-16 DIAGNOSIS — N186 End stage renal disease: Secondary | ICD-10-CM | POA: Diagnosis not present

## 2015-11-16 DIAGNOSIS — Z992 Dependence on renal dialysis: Secondary | ICD-10-CM | POA: Diagnosis not present

## 2015-11-16 DIAGNOSIS — D509 Iron deficiency anemia, unspecified: Secondary | ICD-10-CM | POA: Diagnosis not present

## 2015-11-16 DIAGNOSIS — E1129 Type 2 diabetes mellitus with other diabetic kidney complication: Secondary | ICD-10-CM | POA: Diagnosis not present

## 2015-11-16 DIAGNOSIS — D631 Anemia in chronic kidney disease: Secondary | ICD-10-CM | POA: Diagnosis not present

## 2015-11-18 DIAGNOSIS — J449 Chronic obstructive pulmonary disease, unspecified: Secondary | ICD-10-CM | POA: Diagnosis not present

## 2015-11-18 DIAGNOSIS — E1165 Type 2 diabetes mellitus with hyperglycemia: Secondary | ICD-10-CM | POA: Diagnosis not present

## 2015-11-18 DIAGNOSIS — E11319 Type 2 diabetes mellitus with unspecified diabetic retinopathy without macular edema: Secondary | ICD-10-CM | POA: Diagnosis not present

## 2015-11-18 DIAGNOSIS — E1151 Type 2 diabetes mellitus with diabetic peripheral angiopathy without gangrene: Secondary | ICD-10-CM | POA: Diagnosis not present

## 2015-11-18 DIAGNOSIS — E1140 Type 2 diabetes mellitus with diabetic neuropathy, unspecified: Secondary | ICD-10-CM | POA: Diagnosis not present

## 2015-11-18 DIAGNOSIS — R2681 Unsteadiness on feet: Secondary | ICD-10-CM | POA: Diagnosis not present

## 2015-11-19 DIAGNOSIS — D631 Anemia in chronic kidney disease: Secondary | ICD-10-CM | POA: Diagnosis not present

## 2015-11-19 DIAGNOSIS — N2581 Secondary hyperparathyroidism of renal origin: Secondary | ICD-10-CM | POA: Diagnosis not present

## 2015-11-19 DIAGNOSIS — N186 End stage renal disease: Secondary | ICD-10-CM | POA: Diagnosis not present

## 2015-11-19 DIAGNOSIS — D509 Iron deficiency anemia, unspecified: Secondary | ICD-10-CM | POA: Diagnosis not present

## 2015-11-19 DIAGNOSIS — E1129 Type 2 diabetes mellitus with other diabetic kidney complication: Secondary | ICD-10-CM | POA: Diagnosis not present

## 2015-11-20 DIAGNOSIS — E11319 Type 2 diabetes mellitus with unspecified diabetic retinopathy without macular edema: Secondary | ICD-10-CM | POA: Diagnosis not present

## 2015-11-20 DIAGNOSIS — E1151 Type 2 diabetes mellitus with diabetic peripheral angiopathy without gangrene: Secondary | ICD-10-CM | POA: Diagnosis not present

## 2015-11-20 DIAGNOSIS — E1165 Type 2 diabetes mellitus with hyperglycemia: Secondary | ICD-10-CM | POA: Diagnosis not present

## 2015-11-20 DIAGNOSIS — R2681 Unsteadiness on feet: Secondary | ICD-10-CM | POA: Diagnosis not present

## 2015-11-20 DIAGNOSIS — J449 Chronic obstructive pulmonary disease, unspecified: Secondary | ICD-10-CM | POA: Diagnosis not present

## 2015-11-20 DIAGNOSIS — E1140 Type 2 diabetes mellitus with diabetic neuropathy, unspecified: Secondary | ICD-10-CM | POA: Diagnosis not present

## 2015-11-21 DIAGNOSIS — N2581 Secondary hyperparathyroidism of renal origin: Secondary | ICD-10-CM | POA: Diagnosis not present

## 2015-11-21 DIAGNOSIS — D509 Iron deficiency anemia, unspecified: Secondary | ICD-10-CM | POA: Diagnosis not present

## 2015-11-21 DIAGNOSIS — N186 End stage renal disease: Secondary | ICD-10-CM | POA: Diagnosis not present

## 2015-11-21 DIAGNOSIS — E1129 Type 2 diabetes mellitus with other diabetic kidney complication: Secondary | ICD-10-CM | POA: Diagnosis not present

## 2015-11-21 DIAGNOSIS — D631 Anemia in chronic kidney disease: Secondary | ICD-10-CM | POA: Diagnosis not present

## 2015-11-23 DIAGNOSIS — D631 Anemia in chronic kidney disease: Secondary | ICD-10-CM | POA: Diagnosis not present

## 2015-11-23 DIAGNOSIS — E1129 Type 2 diabetes mellitus with other diabetic kidney complication: Secondary | ICD-10-CM | POA: Diagnosis not present

## 2015-11-23 DIAGNOSIS — N186 End stage renal disease: Secondary | ICD-10-CM | POA: Diagnosis not present

## 2015-11-23 DIAGNOSIS — N2581 Secondary hyperparathyroidism of renal origin: Secondary | ICD-10-CM | POA: Diagnosis not present

## 2015-11-23 DIAGNOSIS — D509 Iron deficiency anemia, unspecified: Secondary | ICD-10-CM | POA: Diagnosis not present

## 2015-11-26 DIAGNOSIS — N2581 Secondary hyperparathyroidism of renal origin: Secondary | ICD-10-CM | POA: Diagnosis not present

## 2015-11-26 DIAGNOSIS — E1129 Type 2 diabetes mellitus with other diabetic kidney complication: Secondary | ICD-10-CM | POA: Diagnosis not present

## 2015-11-26 DIAGNOSIS — N186 End stage renal disease: Secondary | ICD-10-CM | POA: Diagnosis not present

## 2015-11-26 DIAGNOSIS — D509 Iron deficiency anemia, unspecified: Secondary | ICD-10-CM | POA: Diagnosis not present

## 2015-11-26 DIAGNOSIS — D631 Anemia in chronic kidney disease: Secondary | ICD-10-CM | POA: Diagnosis not present

## 2015-11-27 DIAGNOSIS — E11319 Type 2 diabetes mellitus with unspecified diabetic retinopathy without macular edema: Secondary | ICD-10-CM | POA: Diagnosis not present

## 2015-11-27 DIAGNOSIS — J449 Chronic obstructive pulmonary disease, unspecified: Secondary | ICD-10-CM | POA: Diagnosis not present

## 2015-11-27 DIAGNOSIS — R2681 Unsteadiness on feet: Secondary | ICD-10-CM | POA: Diagnosis not present

## 2015-11-27 DIAGNOSIS — E1151 Type 2 diabetes mellitus with diabetic peripheral angiopathy without gangrene: Secondary | ICD-10-CM | POA: Diagnosis not present

## 2015-11-27 DIAGNOSIS — E1165 Type 2 diabetes mellitus with hyperglycemia: Secondary | ICD-10-CM | POA: Diagnosis not present

## 2015-11-27 DIAGNOSIS — E1140 Type 2 diabetes mellitus with diabetic neuropathy, unspecified: Secondary | ICD-10-CM | POA: Diagnosis not present

## 2015-11-28 DIAGNOSIS — D509 Iron deficiency anemia, unspecified: Secondary | ICD-10-CM | POA: Diagnosis not present

## 2015-11-28 DIAGNOSIS — N186 End stage renal disease: Secondary | ICD-10-CM | POA: Diagnosis not present

## 2015-11-28 DIAGNOSIS — E1129 Type 2 diabetes mellitus with other diabetic kidney complication: Secondary | ICD-10-CM | POA: Diagnosis not present

## 2015-11-28 DIAGNOSIS — D631 Anemia in chronic kidney disease: Secondary | ICD-10-CM | POA: Diagnosis not present

## 2015-11-28 DIAGNOSIS — N2581 Secondary hyperparathyroidism of renal origin: Secondary | ICD-10-CM | POA: Diagnosis not present

## 2015-11-29 DIAGNOSIS — E11319 Type 2 diabetes mellitus with unspecified diabetic retinopathy without macular edema: Secondary | ICD-10-CM | POA: Diagnosis not present

## 2015-11-29 DIAGNOSIS — J449 Chronic obstructive pulmonary disease, unspecified: Secondary | ICD-10-CM | POA: Diagnosis not present

## 2015-11-29 DIAGNOSIS — E1140 Type 2 diabetes mellitus with diabetic neuropathy, unspecified: Secondary | ICD-10-CM | POA: Diagnosis not present

## 2015-11-29 DIAGNOSIS — E1151 Type 2 diabetes mellitus with diabetic peripheral angiopathy without gangrene: Secondary | ICD-10-CM | POA: Diagnosis not present

## 2015-11-29 DIAGNOSIS — R2681 Unsteadiness on feet: Secondary | ICD-10-CM | POA: Diagnosis not present

## 2015-11-29 DIAGNOSIS — E1165 Type 2 diabetes mellitus with hyperglycemia: Secondary | ICD-10-CM | POA: Diagnosis not present

## 2015-11-30 DIAGNOSIS — N2581 Secondary hyperparathyroidism of renal origin: Secondary | ICD-10-CM | POA: Diagnosis not present

## 2015-11-30 DIAGNOSIS — D509 Iron deficiency anemia, unspecified: Secondary | ICD-10-CM | POA: Diagnosis not present

## 2015-11-30 DIAGNOSIS — E1129 Type 2 diabetes mellitus with other diabetic kidney complication: Secondary | ICD-10-CM | POA: Diagnosis not present

## 2015-11-30 DIAGNOSIS — N186 End stage renal disease: Secondary | ICD-10-CM | POA: Diagnosis not present

## 2015-11-30 DIAGNOSIS — D631 Anemia in chronic kidney disease: Secondary | ICD-10-CM | POA: Diagnosis not present

## 2015-12-02 DIAGNOSIS — E11319 Type 2 diabetes mellitus with unspecified diabetic retinopathy without macular edema: Secondary | ICD-10-CM | POA: Diagnosis not present

## 2015-12-02 DIAGNOSIS — R2681 Unsteadiness on feet: Secondary | ICD-10-CM | POA: Diagnosis not present

## 2015-12-02 DIAGNOSIS — E1165 Type 2 diabetes mellitus with hyperglycemia: Secondary | ICD-10-CM | POA: Diagnosis not present

## 2015-12-02 DIAGNOSIS — J449 Chronic obstructive pulmonary disease, unspecified: Secondary | ICD-10-CM | POA: Diagnosis not present

## 2015-12-02 DIAGNOSIS — E1151 Type 2 diabetes mellitus with diabetic peripheral angiopathy without gangrene: Secondary | ICD-10-CM | POA: Diagnosis not present

## 2015-12-02 DIAGNOSIS — E1140 Type 2 diabetes mellitus with diabetic neuropathy, unspecified: Secondary | ICD-10-CM | POA: Diagnosis not present

## 2015-12-03 DIAGNOSIS — E1129 Type 2 diabetes mellitus with other diabetic kidney complication: Secondary | ICD-10-CM | POA: Diagnosis not present

## 2015-12-03 DIAGNOSIS — N2581 Secondary hyperparathyroidism of renal origin: Secondary | ICD-10-CM | POA: Diagnosis not present

## 2015-12-03 DIAGNOSIS — D631 Anemia in chronic kidney disease: Secondary | ICD-10-CM | POA: Diagnosis not present

## 2015-12-03 DIAGNOSIS — N186 End stage renal disease: Secondary | ICD-10-CM | POA: Diagnosis not present

## 2015-12-03 DIAGNOSIS — D509 Iron deficiency anemia, unspecified: Secondary | ICD-10-CM | POA: Diagnosis not present

## 2015-12-05 DIAGNOSIS — N186 End stage renal disease: Secondary | ICD-10-CM | POA: Diagnosis not present

## 2015-12-05 DIAGNOSIS — E1129 Type 2 diabetes mellitus with other diabetic kidney complication: Secondary | ICD-10-CM | POA: Diagnosis not present

## 2015-12-05 DIAGNOSIS — N2581 Secondary hyperparathyroidism of renal origin: Secondary | ICD-10-CM | POA: Diagnosis not present

## 2015-12-05 DIAGNOSIS — D631 Anemia in chronic kidney disease: Secondary | ICD-10-CM | POA: Diagnosis not present

## 2015-12-05 DIAGNOSIS — D509 Iron deficiency anemia, unspecified: Secondary | ICD-10-CM | POA: Diagnosis not present

## 2015-12-06 DIAGNOSIS — N186 End stage renal disease: Secondary | ICD-10-CM | POA: Diagnosis not present

## 2015-12-06 DIAGNOSIS — R2681 Unsteadiness on feet: Secondary | ICD-10-CM | POA: Diagnosis not present

## 2015-12-06 DIAGNOSIS — E1140 Type 2 diabetes mellitus with diabetic neuropathy, unspecified: Secondary | ICD-10-CM | POA: Diagnosis not present

## 2015-12-06 DIAGNOSIS — T82858A Stenosis of vascular prosthetic devices, implants and grafts, initial encounter: Secondary | ICD-10-CM | POA: Diagnosis not present

## 2015-12-06 DIAGNOSIS — Z992 Dependence on renal dialysis: Secondary | ICD-10-CM | POA: Diagnosis not present

## 2015-12-06 DIAGNOSIS — I871 Compression of vein: Secondary | ICD-10-CM | POA: Diagnosis not present

## 2015-12-06 DIAGNOSIS — E1165 Type 2 diabetes mellitus with hyperglycemia: Secondary | ICD-10-CM | POA: Diagnosis not present

## 2015-12-06 DIAGNOSIS — J449 Chronic obstructive pulmonary disease, unspecified: Secondary | ICD-10-CM | POA: Diagnosis not present

## 2015-12-06 DIAGNOSIS — E11319 Type 2 diabetes mellitus with unspecified diabetic retinopathy without macular edema: Secondary | ICD-10-CM | POA: Diagnosis not present

## 2015-12-06 DIAGNOSIS — E1151 Type 2 diabetes mellitus with diabetic peripheral angiopathy without gangrene: Secondary | ICD-10-CM | POA: Diagnosis not present

## 2015-12-07 DIAGNOSIS — N186 End stage renal disease: Secondary | ICD-10-CM | POA: Diagnosis not present

## 2015-12-07 DIAGNOSIS — E1129 Type 2 diabetes mellitus with other diabetic kidney complication: Secondary | ICD-10-CM | POA: Diagnosis not present

## 2015-12-07 DIAGNOSIS — N2581 Secondary hyperparathyroidism of renal origin: Secondary | ICD-10-CM | POA: Diagnosis not present

## 2015-12-07 DIAGNOSIS — D631 Anemia in chronic kidney disease: Secondary | ICD-10-CM | POA: Diagnosis not present

## 2015-12-07 DIAGNOSIS — D509 Iron deficiency anemia, unspecified: Secondary | ICD-10-CM | POA: Diagnosis not present

## 2015-12-09 DIAGNOSIS — E1165 Type 2 diabetes mellitus with hyperglycemia: Secondary | ICD-10-CM | POA: Diagnosis not present

## 2015-12-09 DIAGNOSIS — E11319 Type 2 diabetes mellitus with unspecified diabetic retinopathy without macular edema: Secondary | ICD-10-CM | POA: Diagnosis not present

## 2015-12-09 DIAGNOSIS — R2681 Unsteadiness on feet: Secondary | ICD-10-CM | POA: Diagnosis not present

## 2015-12-09 DIAGNOSIS — E1140 Type 2 diabetes mellitus with diabetic neuropathy, unspecified: Secondary | ICD-10-CM | POA: Diagnosis not present

## 2015-12-09 DIAGNOSIS — J449 Chronic obstructive pulmonary disease, unspecified: Secondary | ICD-10-CM | POA: Diagnosis not present

## 2015-12-09 DIAGNOSIS — E1151 Type 2 diabetes mellitus with diabetic peripheral angiopathy without gangrene: Secondary | ICD-10-CM | POA: Diagnosis not present

## 2015-12-10 DIAGNOSIS — D509 Iron deficiency anemia, unspecified: Secondary | ICD-10-CM | POA: Diagnosis not present

## 2015-12-10 DIAGNOSIS — E1129 Type 2 diabetes mellitus with other diabetic kidney complication: Secondary | ICD-10-CM | POA: Diagnosis not present

## 2015-12-10 DIAGNOSIS — N186 End stage renal disease: Secondary | ICD-10-CM | POA: Diagnosis not present

## 2015-12-10 DIAGNOSIS — D631 Anemia in chronic kidney disease: Secondary | ICD-10-CM | POA: Diagnosis not present

## 2015-12-10 DIAGNOSIS — N2581 Secondary hyperparathyroidism of renal origin: Secondary | ICD-10-CM | POA: Diagnosis not present

## 2015-12-12 DIAGNOSIS — D509 Iron deficiency anemia, unspecified: Secondary | ICD-10-CM | POA: Diagnosis not present

## 2015-12-12 DIAGNOSIS — D631 Anemia in chronic kidney disease: Secondary | ICD-10-CM | POA: Diagnosis not present

## 2015-12-12 DIAGNOSIS — N2581 Secondary hyperparathyroidism of renal origin: Secondary | ICD-10-CM | POA: Diagnosis not present

## 2015-12-12 DIAGNOSIS — N186 End stage renal disease: Secondary | ICD-10-CM | POA: Diagnosis not present

## 2015-12-12 DIAGNOSIS — E1129 Type 2 diabetes mellitus with other diabetic kidney complication: Secondary | ICD-10-CM | POA: Diagnosis not present

## 2015-12-13 DIAGNOSIS — R2681 Unsteadiness on feet: Secondary | ICD-10-CM | POA: Diagnosis not present

## 2015-12-13 DIAGNOSIS — J449 Chronic obstructive pulmonary disease, unspecified: Secondary | ICD-10-CM | POA: Diagnosis not present

## 2015-12-13 DIAGNOSIS — E1140 Type 2 diabetes mellitus with diabetic neuropathy, unspecified: Secondary | ICD-10-CM | POA: Diagnosis not present

## 2015-12-13 DIAGNOSIS — E1151 Type 2 diabetes mellitus with diabetic peripheral angiopathy without gangrene: Secondary | ICD-10-CM | POA: Diagnosis not present

## 2015-12-13 DIAGNOSIS — E1165 Type 2 diabetes mellitus with hyperglycemia: Secondary | ICD-10-CM | POA: Diagnosis not present

## 2015-12-13 DIAGNOSIS — E11319 Type 2 diabetes mellitus with unspecified diabetic retinopathy without macular edema: Secondary | ICD-10-CM | POA: Diagnosis not present

## 2015-12-14 DIAGNOSIS — D509 Iron deficiency anemia, unspecified: Secondary | ICD-10-CM | POA: Diagnosis not present

## 2015-12-14 DIAGNOSIS — N186 End stage renal disease: Secondary | ICD-10-CM | POA: Diagnosis not present

## 2015-12-14 DIAGNOSIS — N2581 Secondary hyperparathyroidism of renal origin: Secondary | ICD-10-CM | POA: Diagnosis not present

## 2015-12-14 DIAGNOSIS — D631 Anemia in chronic kidney disease: Secondary | ICD-10-CM | POA: Diagnosis not present

## 2015-12-14 DIAGNOSIS — E1129 Type 2 diabetes mellitus with other diabetic kidney complication: Secondary | ICD-10-CM | POA: Diagnosis not present

## 2015-12-17 DIAGNOSIS — E1129 Type 2 diabetes mellitus with other diabetic kidney complication: Secondary | ICD-10-CM | POA: Diagnosis not present

## 2015-12-17 DIAGNOSIS — N186 End stage renal disease: Secondary | ICD-10-CM | POA: Diagnosis not present

## 2015-12-17 DIAGNOSIS — Z992 Dependence on renal dialysis: Secondary | ICD-10-CM | POA: Diagnosis not present

## 2015-12-17 DIAGNOSIS — D631 Anemia in chronic kidney disease: Secondary | ICD-10-CM | POA: Diagnosis not present

## 2015-12-17 DIAGNOSIS — N2581 Secondary hyperparathyroidism of renal origin: Secondary | ICD-10-CM | POA: Diagnosis not present

## 2015-12-17 DIAGNOSIS — D509 Iron deficiency anemia, unspecified: Secondary | ICD-10-CM | POA: Diagnosis not present

## 2015-12-18 DIAGNOSIS — E1151 Type 2 diabetes mellitus with diabetic peripheral angiopathy without gangrene: Secondary | ICD-10-CM | POA: Diagnosis not present

## 2015-12-18 DIAGNOSIS — E1165 Type 2 diabetes mellitus with hyperglycemia: Secondary | ICD-10-CM | POA: Diagnosis not present

## 2015-12-18 DIAGNOSIS — J449 Chronic obstructive pulmonary disease, unspecified: Secondary | ICD-10-CM | POA: Diagnosis not present

## 2015-12-18 DIAGNOSIS — R2681 Unsteadiness on feet: Secondary | ICD-10-CM | POA: Diagnosis not present

## 2015-12-18 DIAGNOSIS — E11319 Type 2 diabetes mellitus with unspecified diabetic retinopathy without macular edema: Secondary | ICD-10-CM | POA: Diagnosis not present

## 2015-12-18 DIAGNOSIS — E1140 Type 2 diabetes mellitus with diabetic neuropathy, unspecified: Secondary | ICD-10-CM | POA: Diagnosis not present

## 2015-12-19 DIAGNOSIS — E1129 Type 2 diabetes mellitus with other diabetic kidney complication: Secondary | ICD-10-CM | POA: Diagnosis not present

## 2015-12-19 DIAGNOSIS — N2581 Secondary hyperparathyroidism of renal origin: Secondary | ICD-10-CM | POA: Diagnosis not present

## 2015-12-19 DIAGNOSIS — N186 End stage renal disease: Secondary | ICD-10-CM | POA: Diagnosis not present

## 2015-12-19 DIAGNOSIS — D509 Iron deficiency anemia, unspecified: Secondary | ICD-10-CM | POA: Diagnosis not present

## 2015-12-19 DIAGNOSIS — D631 Anemia in chronic kidney disease: Secondary | ICD-10-CM | POA: Diagnosis not present

## 2015-12-20 DIAGNOSIS — E1151 Type 2 diabetes mellitus with diabetic peripheral angiopathy without gangrene: Secondary | ICD-10-CM | POA: Diagnosis not present

## 2015-12-20 DIAGNOSIS — E11319 Type 2 diabetes mellitus with unspecified diabetic retinopathy without macular edema: Secondary | ICD-10-CM | POA: Diagnosis not present

## 2015-12-20 DIAGNOSIS — R2681 Unsteadiness on feet: Secondary | ICD-10-CM | POA: Diagnosis not present

## 2015-12-20 DIAGNOSIS — E1165 Type 2 diabetes mellitus with hyperglycemia: Secondary | ICD-10-CM | POA: Diagnosis not present

## 2015-12-20 DIAGNOSIS — J449 Chronic obstructive pulmonary disease, unspecified: Secondary | ICD-10-CM | POA: Diagnosis not present

## 2015-12-20 DIAGNOSIS — E1140 Type 2 diabetes mellitus with diabetic neuropathy, unspecified: Secondary | ICD-10-CM | POA: Diagnosis not present

## 2015-12-21 DIAGNOSIS — D631 Anemia in chronic kidney disease: Secondary | ICD-10-CM | POA: Diagnosis not present

## 2015-12-21 DIAGNOSIS — D509 Iron deficiency anemia, unspecified: Secondary | ICD-10-CM | POA: Diagnosis not present

## 2015-12-21 DIAGNOSIS — N2581 Secondary hyperparathyroidism of renal origin: Secondary | ICD-10-CM | POA: Diagnosis not present

## 2015-12-21 DIAGNOSIS — N186 End stage renal disease: Secondary | ICD-10-CM | POA: Diagnosis not present

## 2015-12-21 DIAGNOSIS — E1129 Type 2 diabetes mellitus with other diabetic kidney complication: Secondary | ICD-10-CM | POA: Diagnosis not present

## 2015-12-24 DIAGNOSIS — E1129 Type 2 diabetes mellitus with other diabetic kidney complication: Secondary | ICD-10-CM | POA: Diagnosis not present

## 2015-12-24 DIAGNOSIS — D631 Anemia in chronic kidney disease: Secondary | ICD-10-CM | POA: Diagnosis not present

## 2015-12-24 DIAGNOSIS — D509 Iron deficiency anemia, unspecified: Secondary | ICD-10-CM | POA: Diagnosis not present

## 2015-12-24 DIAGNOSIS — N186 End stage renal disease: Secondary | ICD-10-CM | POA: Diagnosis not present

## 2015-12-24 DIAGNOSIS — N2581 Secondary hyperparathyroidism of renal origin: Secondary | ICD-10-CM | POA: Diagnosis not present

## 2015-12-26 DIAGNOSIS — N186 End stage renal disease: Secondary | ICD-10-CM | POA: Diagnosis not present

## 2015-12-26 DIAGNOSIS — D631 Anemia in chronic kidney disease: Secondary | ICD-10-CM | POA: Diagnosis not present

## 2015-12-26 DIAGNOSIS — E1129 Type 2 diabetes mellitus with other diabetic kidney complication: Secondary | ICD-10-CM | POA: Diagnosis not present

## 2015-12-26 DIAGNOSIS — N2581 Secondary hyperparathyroidism of renal origin: Secondary | ICD-10-CM | POA: Diagnosis not present

## 2015-12-26 DIAGNOSIS — D509 Iron deficiency anemia, unspecified: Secondary | ICD-10-CM | POA: Diagnosis not present

## 2015-12-27 DIAGNOSIS — J449 Chronic obstructive pulmonary disease, unspecified: Secondary | ICD-10-CM | POA: Diagnosis not present

## 2015-12-27 DIAGNOSIS — E1165 Type 2 diabetes mellitus with hyperglycemia: Secondary | ICD-10-CM | POA: Diagnosis not present

## 2015-12-27 DIAGNOSIS — E1140 Type 2 diabetes mellitus with diabetic neuropathy, unspecified: Secondary | ICD-10-CM | POA: Diagnosis not present

## 2015-12-27 DIAGNOSIS — E11319 Type 2 diabetes mellitus with unspecified diabetic retinopathy without macular edema: Secondary | ICD-10-CM | POA: Diagnosis not present

## 2015-12-27 DIAGNOSIS — E1151 Type 2 diabetes mellitus with diabetic peripheral angiopathy without gangrene: Secondary | ICD-10-CM | POA: Diagnosis not present

## 2015-12-27 DIAGNOSIS — R2681 Unsteadiness on feet: Secondary | ICD-10-CM | POA: Diagnosis not present

## 2015-12-28 DIAGNOSIS — N186 End stage renal disease: Secondary | ICD-10-CM | POA: Diagnosis not present

## 2015-12-28 DIAGNOSIS — D509 Iron deficiency anemia, unspecified: Secondary | ICD-10-CM | POA: Diagnosis not present

## 2015-12-28 DIAGNOSIS — N2581 Secondary hyperparathyroidism of renal origin: Secondary | ICD-10-CM | POA: Diagnosis not present

## 2015-12-28 DIAGNOSIS — D631 Anemia in chronic kidney disease: Secondary | ICD-10-CM | POA: Diagnosis not present

## 2015-12-28 DIAGNOSIS — E1129 Type 2 diabetes mellitus with other diabetic kidney complication: Secondary | ICD-10-CM | POA: Diagnosis not present

## 2015-12-29 DIAGNOSIS — Z794 Long term (current) use of insulin: Secondary | ICD-10-CM | POA: Diagnosis not present

## 2015-12-29 DIAGNOSIS — E1165 Type 2 diabetes mellitus with hyperglycemia: Secondary | ICD-10-CM | POA: Diagnosis not present

## 2015-12-29 DIAGNOSIS — Z89511 Acquired absence of right leg below knee: Secondary | ICD-10-CM | POA: Diagnosis not present

## 2015-12-29 DIAGNOSIS — R2681 Unsteadiness on feet: Secondary | ICD-10-CM | POA: Diagnosis not present

## 2015-12-29 DIAGNOSIS — Z992 Dependence on renal dialysis: Secondary | ICD-10-CM | POA: Diagnosis not present

## 2015-12-29 DIAGNOSIS — Z720 Tobacco use: Secondary | ICD-10-CM | POA: Diagnosis not present

## 2015-12-29 DIAGNOSIS — J449 Chronic obstructive pulmonary disease, unspecified: Secondary | ICD-10-CM | POA: Diagnosis not present

## 2015-12-29 DIAGNOSIS — E1122 Type 2 diabetes mellitus with diabetic chronic kidney disease: Secondary | ICD-10-CM | POA: Diagnosis not present

## 2015-12-29 DIAGNOSIS — E1143 Type 2 diabetes mellitus with diabetic autonomic (poly)neuropathy: Secondary | ICD-10-CM | POA: Diagnosis not present

## 2015-12-29 DIAGNOSIS — E1151 Type 2 diabetes mellitus with diabetic peripheral angiopathy without gangrene: Secondary | ICD-10-CM | POA: Diagnosis not present

## 2015-12-29 DIAGNOSIS — Z89512 Acquired absence of left leg below knee: Secondary | ICD-10-CM | POA: Diagnosis not present

## 2015-12-29 DIAGNOSIS — E1140 Type 2 diabetes mellitus with diabetic neuropathy, unspecified: Secondary | ICD-10-CM | POA: Diagnosis not present

## 2015-12-29 DIAGNOSIS — N186 End stage renal disease: Secondary | ICD-10-CM | POA: Diagnosis not present

## 2015-12-29 DIAGNOSIS — I120 Hypertensive chronic kidney disease with stage 5 chronic kidney disease or end stage renal disease: Secondary | ICD-10-CM | POA: Diagnosis not present

## 2015-12-29 DIAGNOSIS — R32 Unspecified urinary incontinence: Secondary | ICD-10-CM | POA: Diagnosis not present

## 2015-12-29 DIAGNOSIS — E11319 Type 2 diabetes mellitus with unspecified diabetic retinopathy without macular edema: Secondary | ICD-10-CM | POA: Diagnosis not present

## 2015-12-30 DIAGNOSIS — E1165 Type 2 diabetes mellitus with hyperglycemia: Secondary | ICD-10-CM | POA: Diagnosis not present

## 2015-12-30 DIAGNOSIS — J449 Chronic obstructive pulmonary disease, unspecified: Secondary | ICD-10-CM | POA: Diagnosis not present

## 2015-12-30 DIAGNOSIS — E11319 Type 2 diabetes mellitus with unspecified diabetic retinopathy without macular edema: Secondary | ICD-10-CM | POA: Diagnosis not present

## 2015-12-30 DIAGNOSIS — R2681 Unsteadiness on feet: Secondary | ICD-10-CM | POA: Diagnosis not present

## 2015-12-30 DIAGNOSIS — E1151 Type 2 diabetes mellitus with diabetic peripheral angiopathy without gangrene: Secondary | ICD-10-CM | POA: Diagnosis not present

## 2015-12-30 DIAGNOSIS — E1140 Type 2 diabetes mellitus with diabetic neuropathy, unspecified: Secondary | ICD-10-CM | POA: Diagnosis not present

## 2015-12-31 DIAGNOSIS — D631 Anemia in chronic kidney disease: Secondary | ICD-10-CM | POA: Diagnosis not present

## 2015-12-31 DIAGNOSIS — E1129 Type 2 diabetes mellitus with other diabetic kidney complication: Secondary | ICD-10-CM | POA: Diagnosis not present

## 2015-12-31 DIAGNOSIS — N186 End stage renal disease: Secondary | ICD-10-CM | POA: Diagnosis not present

## 2015-12-31 DIAGNOSIS — N2581 Secondary hyperparathyroidism of renal origin: Secondary | ICD-10-CM | POA: Diagnosis not present

## 2015-12-31 DIAGNOSIS — D509 Iron deficiency anemia, unspecified: Secondary | ICD-10-CM | POA: Diagnosis not present

## 2016-01-01 DIAGNOSIS — E11319 Type 2 diabetes mellitus with unspecified diabetic retinopathy without macular edema: Secondary | ICD-10-CM | POA: Diagnosis not present

## 2016-01-01 DIAGNOSIS — R2681 Unsteadiness on feet: Secondary | ICD-10-CM | POA: Diagnosis not present

## 2016-01-01 DIAGNOSIS — E1140 Type 2 diabetes mellitus with diabetic neuropathy, unspecified: Secondary | ICD-10-CM | POA: Diagnosis not present

## 2016-01-01 DIAGNOSIS — E1165 Type 2 diabetes mellitus with hyperglycemia: Secondary | ICD-10-CM | POA: Diagnosis not present

## 2016-01-01 DIAGNOSIS — J449 Chronic obstructive pulmonary disease, unspecified: Secondary | ICD-10-CM | POA: Diagnosis not present

## 2016-01-01 DIAGNOSIS — E1151 Type 2 diabetes mellitus with diabetic peripheral angiopathy without gangrene: Secondary | ICD-10-CM | POA: Diagnosis not present

## 2016-01-02 DIAGNOSIS — E1129 Type 2 diabetes mellitus with other diabetic kidney complication: Secondary | ICD-10-CM | POA: Diagnosis not present

## 2016-01-02 DIAGNOSIS — D631 Anemia in chronic kidney disease: Secondary | ICD-10-CM | POA: Diagnosis not present

## 2016-01-02 DIAGNOSIS — N2581 Secondary hyperparathyroidism of renal origin: Secondary | ICD-10-CM | POA: Diagnosis not present

## 2016-01-02 DIAGNOSIS — D509 Iron deficiency anemia, unspecified: Secondary | ICD-10-CM | POA: Diagnosis not present

## 2016-01-02 DIAGNOSIS — N186 End stage renal disease: Secondary | ICD-10-CM | POA: Diagnosis not present

## 2016-01-04 DIAGNOSIS — N2581 Secondary hyperparathyroidism of renal origin: Secondary | ICD-10-CM | POA: Diagnosis not present

## 2016-01-04 DIAGNOSIS — D509 Iron deficiency anemia, unspecified: Secondary | ICD-10-CM | POA: Diagnosis not present

## 2016-01-04 DIAGNOSIS — E1129 Type 2 diabetes mellitus with other diabetic kidney complication: Secondary | ICD-10-CM | POA: Diagnosis not present

## 2016-01-04 DIAGNOSIS — D631 Anemia in chronic kidney disease: Secondary | ICD-10-CM | POA: Diagnosis not present

## 2016-01-04 DIAGNOSIS — N186 End stage renal disease: Secondary | ICD-10-CM | POA: Diagnosis not present

## 2016-01-07 DIAGNOSIS — D631 Anemia in chronic kidney disease: Secondary | ICD-10-CM | POA: Diagnosis not present

## 2016-01-07 DIAGNOSIS — N186 End stage renal disease: Secondary | ICD-10-CM | POA: Diagnosis not present

## 2016-01-07 DIAGNOSIS — N2581 Secondary hyperparathyroidism of renal origin: Secondary | ICD-10-CM | POA: Diagnosis not present

## 2016-01-07 DIAGNOSIS — D509 Iron deficiency anemia, unspecified: Secondary | ICD-10-CM | POA: Diagnosis not present

## 2016-01-07 DIAGNOSIS — E1129 Type 2 diabetes mellitus with other diabetic kidney complication: Secondary | ICD-10-CM | POA: Diagnosis not present

## 2016-01-09 DIAGNOSIS — N186 End stage renal disease: Secondary | ICD-10-CM | POA: Diagnosis not present

## 2016-01-09 DIAGNOSIS — E1129 Type 2 diabetes mellitus with other diabetic kidney complication: Secondary | ICD-10-CM | POA: Diagnosis not present

## 2016-01-09 DIAGNOSIS — N2581 Secondary hyperparathyroidism of renal origin: Secondary | ICD-10-CM | POA: Diagnosis not present

## 2016-01-09 DIAGNOSIS — D509 Iron deficiency anemia, unspecified: Secondary | ICD-10-CM | POA: Diagnosis not present

## 2016-01-09 DIAGNOSIS — D631 Anemia in chronic kidney disease: Secondary | ICD-10-CM | POA: Diagnosis not present

## 2016-01-10 DIAGNOSIS — E1165 Type 2 diabetes mellitus with hyperglycemia: Secondary | ICD-10-CM | POA: Diagnosis not present

## 2016-01-10 DIAGNOSIS — E1151 Type 2 diabetes mellitus with diabetic peripheral angiopathy without gangrene: Secondary | ICD-10-CM | POA: Diagnosis not present

## 2016-01-10 DIAGNOSIS — E1140 Type 2 diabetes mellitus with diabetic neuropathy, unspecified: Secondary | ICD-10-CM | POA: Diagnosis not present

## 2016-01-10 DIAGNOSIS — R2681 Unsteadiness on feet: Secondary | ICD-10-CM | POA: Diagnosis not present

## 2016-01-10 DIAGNOSIS — J449 Chronic obstructive pulmonary disease, unspecified: Secondary | ICD-10-CM | POA: Diagnosis not present

## 2016-01-10 DIAGNOSIS — E11319 Type 2 diabetes mellitus with unspecified diabetic retinopathy without macular edema: Secondary | ICD-10-CM | POA: Diagnosis not present

## 2016-01-11 DIAGNOSIS — N2581 Secondary hyperparathyroidism of renal origin: Secondary | ICD-10-CM | POA: Diagnosis not present

## 2016-01-11 DIAGNOSIS — D631 Anemia in chronic kidney disease: Secondary | ICD-10-CM | POA: Diagnosis not present

## 2016-01-11 DIAGNOSIS — D509 Iron deficiency anemia, unspecified: Secondary | ICD-10-CM | POA: Diagnosis not present

## 2016-01-11 DIAGNOSIS — N186 End stage renal disease: Secondary | ICD-10-CM | POA: Diagnosis not present

## 2016-01-11 DIAGNOSIS — E1129 Type 2 diabetes mellitus with other diabetic kidney complication: Secondary | ICD-10-CM | POA: Diagnosis not present

## 2016-01-14 DIAGNOSIS — Z992 Dependence on renal dialysis: Secondary | ICD-10-CM | POA: Diagnosis not present

## 2016-01-14 DIAGNOSIS — D509 Iron deficiency anemia, unspecified: Secondary | ICD-10-CM | POA: Diagnosis not present

## 2016-01-14 DIAGNOSIS — N2581 Secondary hyperparathyroidism of renal origin: Secondary | ICD-10-CM | POA: Diagnosis not present

## 2016-01-14 DIAGNOSIS — D631 Anemia in chronic kidney disease: Secondary | ICD-10-CM | POA: Diagnosis not present

## 2016-01-14 DIAGNOSIS — N186 End stage renal disease: Secondary | ICD-10-CM | POA: Diagnosis not present

## 2016-01-14 DIAGNOSIS — E1129 Type 2 diabetes mellitus with other diabetic kidney complication: Secondary | ICD-10-CM | POA: Diagnosis not present

## 2016-01-16 DIAGNOSIS — N186 End stage renal disease: Secondary | ICD-10-CM | POA: Diagnosis not present

## 2016-01-16 DIAGNOSIS — E1129 Type 2 diabetes mellitus with other diabetic kidney complication: Secondary | ICD-10-CM | POA: Diagnosis not present

## 2016-01-16 DIAGNOSIS — N2581 Secondary hyperparathyroidism of renal origin: Secondary | ICD-10-CM | POA: Diagnosis not present

## 2016-01-16 DIAGNOSIS — D509 Iron deficiency anemia, unspecified: Secondary | ICD-10-CM | POA: Diagnosis not present

## 2016-01-18 DIAGNOSIS — N2581 Secondary hyperparathyroidism of renal origin: Secondary | ICD-10-CM | POA: Diagnosis not present

## 2016-01-18 DIAGNOSIS — E1129 Type 2 diabetes mellitus with other diabetic kidney complication: Secondary | ICD-10-CM | POA: Diagnosis not present

## 2016-01-18 DIAGNOSIS — N186 End stage renal disease: Secondary | ICD-10-CM | POA: Diagnosis not present

## 2016-01-18 DIAGNOSIS — D509 Iron deficiency anemia, unspecified: Secondary | ICD-10-CM | POA: Diagnosis not present

## 2016-01-21 DIAGNOSIS — N186 End stage renal disease: Secondary | ICD-10-CM | POA: Diagnosis not present

## 2016-01-21 DIAGNOSIS — D509 Iron deficiency anemia, unspecified: Secondary | ICD-10-CM | POA: Diagnosis not present

## 2016-01-21 DIAGNOSIS — E1129 Type 2 diabetes mellitus with other diabetic kidney complication: Secondary | ICD-10-CM | POA: Diagnosis not present

## 2016-01-21 DIAGNOSIS — N2581 Secondary hyperparathyroidism of renal origin: Secondary | ICD-10-CM | POA: Diagnosis not present

## 2016-01-23 DIAGNOSIS — E1129 Type 2 diabetes mellitus with other diabetic kidney complication: Secondary | ICD-10-CM | POA: Diagnosis not present

## 2016-01-23 DIAGNOSIS — D509 Iron deficiency anemia, unspecified: Secondary | ICD-10-CM | POA: Diagnosis not present

## 2016-01-23 DIAGNOSIS — N2581 Secondary hyperparathyroidism of renal origin: Secondary | ICD-10-CM | POA: Diagnosis not present

## 2016-01-23 DIAGNOSIS — N186 End stage renal disease: Secondary | ICD-10-CM | POA: Diagnosis not present

## 2016-01-25 DIAGNOSIS — D509 Iron deficiency anemia, unspecified: Secondary | ICD-10-CM | POA: Diagnosis not present

## 2016-01-25 DIAGNOSIS — N2581 Secondary hyperparathyroidism of renal origin: Secondary | ICD-10-CM | POA: Diagnosis not present

## 2016-01-25 DIAGNOSIS — E1129 Type 2 diabetes mellitus with other diabetic kidney complication: Secondary | ICD-10-CM | POA: Diagnosis not present

## 2016-01-25 DIAGNOSIS — N186 End stage renal disease: Secondary | ICD-10-CM | POA: Diagnosis not present

## 2016-01-27 DIAGNOSIS — J181 Lobar pneumonia, unspecified organism: Secondary | ICD-10-CM | POA: Diagnosis not present

## 2016-01-27 DIAGNOSIS — R0602 Shortness of breath: Secondary | ICD-10-CM | POA: Diagnosis not present

## 2016-01-27 DIAGNOSIS — J9601 Acute respiratory failure with hypoxia: Secondary | ICD-10-CM | POA: Diagnosis not present

## 2016-01-27 DIAGNOSIS — R05 Cough: Secondary | ICD-10-CM | POA: Diagnosis not present

## 2016-01-28 DIAGNOSIS — J189 Pneumonia, unspecified organism: Secondary | ICD-10-CM | POA: Insufficient documentation

## 2016-01-28 DIAGNOSIS — I4581 Long QT syndrome: Secondary | ICD-10-CM | POA: Diagnosis not present

## 2016-01-28 DIAGNOSIS — M9080 Osteopathy in diseases classified elsewhere, unspecified site: Secondary | ICD-10-CM | POA: Diagnosis not present

## 2016-01-28 DIAGNOSIS — R778 Other specified abnormalities of plasma proteins: Secondary | ICD-10-CM | POA: Insufficient documentation

## 2016-01-28 DIAGNOSIS — I120 Hypertensive chronic kidney disease with stage 5 chronic kidney disease or end stage renal disease: Secondary | ICD-10-CM | POA: Diagnosis not present

## 2016-01-28 DIAGNOSIS — E889 Metabolic disorder, unspecified: Secondary | ICD-10-CM | POA: Diagnosis not present

## 2016-01-28 DIAGNOSIS — Z89511 Acquired absence of right leg below knee: Secondary | ICD-10-CM | POA: Insufficient documentation

## 2016-01-28 DIAGNOSIS — D696 Thrombocytopenia, unspecified: Secondary | ICD-10-CM | POA: Insufficient documentation

## 2016-01-28 DIAGNOSIS — I491 Atrial premature depolarization: Secondary | ICD-10-CM | POA: Diagnosis not present

## 2016-01-28 DIAGNOSIS — E119 Type 2 diabetes mellitus without complications: Secondary | ICD-10-CM | POA: Insufficient documentation

## 2016-01-28 DIAGNOSIS — E1122 Type 2 diabetes mellitus with diabetic chronic kidney disease: Secondary | ICD-10-CM | POA: Diagnosis not present

## 2016-01-28 DIAGNOSIS — D631 Anemia in chronic kidney disease: Secondary | ICD-10-CM | POA: Diagnosis not present

## 2016-01-28 DIAGNOSIS — F1210 Cannabis abuse, uncomplicated: Secondary | ICD-10-CM | POA: Insufficient documentation

## 2016-01-28 DIAGNOSIS — R7989 Other specified abnormal findings of blood chemistry: Secondary | ICD-10-CM

## 2016-01-28 DIAGNOSIS — N186 End stage renal disease: Secondary | ICD-10-CM | POA: Diagnosis not present

## 2016-01-28 DIAGNOSIS — Z992 Dependence on renal dialysis: Secondary | ICD-10-CM | POA: Diagnosis not present

## 2016-01-28 DIAGNOSIS — I10 Essential (primary) hypertension: Secondary | ICD-10-CM | POA: Insufficient documentation

## 2016-01-28 DIAGNOSIS — E46 Unspecified protein-calorie malnutrition: Secondary | ICD-10-CM | POA: Diagnosis not present

## 2016-01-28 DIAGNOSIS — Z89512 Acquired absence of left leg below knee: Secondary | ICD-10-CM

## 2016-01-28 DIAGNOSIS — J9601 Acute respiratory failure with hypoxia: Secondary | ICD-10-CM | POA: Insufficient documentation

## 2016-01-28 DIAGNOSIS — I160 Hypertensive urgency: Secondary | ICD-10-CM | POA: Diagnosis not present

## 2016-01-29 DIAGNOSIS — J9601 Acute respiratory failure with hypoxia: Secondary | ICD-10-CM | POA: Diagnosis not present

## 2016-01-29 DIAGNOSIS — B59 Pneumocystosis: Secondary | ICD-10-CM | POA: Diagnosis not present

## 2016-01-29 DIAGNOSIS — D631 Anemia in chronic kidney disease: Secondary | ICD-10-CM | POA: Diagnosis not present

## 2016-01-29 DIAGNOSIS — I10 Essential (primary) hypertension: Secondary | ICD-10-CM | POA: Diagnosis not present

## 2016-01-29 DIAGNOSIS — E46 Unspecified protein-calorie malnutrition: Secondary | ICD-10-CM | POA: Diagnosis not present

## 2016-01-29 DIAGNOSIS — E1122 Type 2 diabetes mellitus with diabetic chronic kidney disease: Secondary | ICD-10-CM | POA: Diagnosis not present

## 2016-01-29 DIAGNOSIS — N186 End stage renal disease: Secondary | ICD-10-CM | POA: Diagnosis not present

## 2016-01-29 DIAGNOSIS — Z992 Dependence on renal dialysis: Secondary | ICD-10-CM | POA: Diagnosis not present

## 2016-01-29 DIAGNOSIS — I120 Hypertensive chronic kidney disease with stage 5 chronic kidney disease or end stage renal disease: Secondary | ICD-10-CM | POA: Diagnosis not present

## 2016-01-29 DIAGNOSIS — J189 Pneumonia, unspecified organism: Secondary | ICD-10-CM | POA: Diagnosis not present

## 2016-01-29 DIAGNOSIS — M9080 Osteopathy in diseases classified elsewhere, unspecified site: Secondary | ICD-10-CM | POA: Diagnosis not present

## 2016-01-29 DIAGNOSIS — R7989 Other specified abnormal findings of blood chemistry: Secondary | ICD-10-CM | POA: Diagnosis not present

## 2016-01-29 DIAGNOSIS — E889 Metabolic disorder, unspecified: Secondary | ICD-10-CM | POA: Diagnosis not present

## 2016-02-01 DIAGNOSIS — N186 End stage renal disease: Secondary | ICD-10-CM | POA: Diagnosis not present

## 2016-02-01 DIAGNOSIS — N2581 Secondary hyperparathyroidism of renal origin: Secondary | ICD-10-CM | POA: Diagnosis not present

## 2016-02-01 DIAGNOSIS — D509 Iron deficiency anemia, unspecified: Secondary | ICD-10-CM | POA: Diagnosis not present

## 2016-02-01 DIAGNOSIS — E1129 Type 2 diabetes mellitus with other diabetic kidney complication: Secondary | ICD-10-CM | POA: Diagnosis not present

## 2016-02-04 DIAGNOSIS — N2581 Secondary hyperparathyroidism of renal origin: Secondary | ICD-10-CM | POA: Diagnosis not present

## 2016-02-04 DIAGNOSIS — N186 End stage renal disease: Secondary | ICD-10-CM | POA: Diagnosis not present

## 2016-02-04 DIAGNOSIS — E1129 Type 2 diabetes mellitus with other diabetic kidney complication: Secondary | ICD-10-CM | POA: Diagnosis not present

## 2016-02-04 DIAGNOSIS — D509 Iron deficiency anemia, unspecified: Secondary | ICD-10-CM | POA: Diagnosis not present

## 2016-02-06 DIAGNOSIS — N186 End stage renal disease: Secondary | ICD-10-CM | POA: Diagnosis not present

## 2016-02-06 DIAGNOSIS — D509 Iron deficiency anemia, unspecified: Secondary | ICD-10-CM | POA: Diagnosis not present

## 2016-02-06 DIAGNOSIS — E1129 Type 2 diabetes mellitus with other diabetic kidney complication: Secondary | ICD-10-CM | POA: Diagnosis not present

## 2016-02-06 DIAGNOSIS — N2581 Secondary hyperparathyroidism of renal origin: Secondary | ICD-10-CM | POA: Diagnosis not present

## 2016-02-08 DIAGNOSIS — N2581 Secondary hyperparathyroidism of renal origin: Secondary | ICD-10-CM | POA: Diagnosis not present

## 2016-02-08 DIAGNOSIS — N186 End stage renal disease: Secondary | ICD-10-CM | POA: Diagnosis not present

## 2016-02-08 DIAGNOSIS — D509 Iron deficiency anemia, unspecified: Secondary | ICD-10-CM | POA: Diagnosis not present

## 2016-02-08 DIAGNOSIS — E1129 Type 2 diabetes mellitus with other diabetic kidney complication: Secondary | ICD-10-CM | POA: Diagnosis not present

## 2016-02-11 DIAGNOSIS — D509 Iron deficiency anemia, unspecified: Secondary | ICD-10-CM | POA: Diagnosis not present

## 2016-02-11 DIAGNOSIS — N186 End stage renal disease: Secondary | ICD-10-CM | POA: Diagnosis not present

## 2016-02-11 DIAGNOSIS — N2581 Secondary hyperparathyroidism of renal origin: Secondary | ICD-10-CM | POA: Diagnosis not present

## 2016-02-11 DIAGNOSIS — E1129 Type 2 diabetes mellitus with other diabetic kidney complication: Secondary | ICD-10-CM | POA: Diagnosis not present

## 2016-02-13 DIAGNOSIS — N2581 Secondary hyperparathyroidism of renal origin: Secondary | ICD-10-CM | POA: Diagnosis not present

## 2016-02-13 DIAGNOSIS — E1129 Type 2 diabetes mellitus with other diabetic kidney complication: Secondary | ICD-10-CM | POA: Diagnosis not present

## 2016-02-13 DIAGNOSIS — N186 End stage renal disease: Secondary | ICD-10-CM | POA: Diagnosis not present

## 2016-02-13 DIAGNOSIS — D509 Iron deficiency anemia, unspecified: Secondary | ICD-10-CM | POA: Diagnosis not present

## 2016-02-14 DIAGNOSIS — E1129 Type 2 diabetes mellitus with other diabetic kidney complication: Secondary | ICD-10-CM | POA: Diagnosis not present

## 2016-02-14 DIAGNOSIS — Z992 Dependence on renal dialysis: Secondary | ICD-10-CM | POA: Diagnosis not present

## 2016-02-14 DIAGNOSIS — N186 End stage renal disease: Secondary | ICD-10-CM | POA: Diagnosis not present

## 2016-02-15 DIAGNOSIS — N2581 Secondary hyperparathyroidism of renal origin: Secondary | ICD-10-CM | POA: Diagnosis not present

## 2016-02-15 DIAGNOSIS — N186 End stage renal disease: Secondary | ICD-10-CM | POA: Diagnosis not present

## 2016-02-15 DIAGNOSIS — E1129 Type 2 diabetes mellitus with other diabetic kidney complication: Secondary | ICD-10-CM | POA: Diagnosis not present

## 2016-02-15 DIAGNOSIS — D509 Iron deficiency anemia, unspecified: Secondary | ICD-10-CM | POA: Diagnosis not present

## 2016-02-15 DIAGNOSIS — D631 Anemia in chronic kidney disease: Secondary | ICD-10-CM | POA: Diagnosis not present

## 2016-02-18 DIAGNOSIS — N2581 Secondary hyperparathyroidism of renal origin: Secondary | ICD-10-CM | POA: Diagnosis not present

## 2016-02-18 DIAGNOSIS — E1129 Type 2 diabetes mellitus with other diabetic kidney complication: Secondary | ICD-10-CM | POA: Diagnosis not present

## 2016-02-18 DIAGNOSIS — D509 Iron deficiency anemia, unspecified: Secondary | ICD-10-CM | POA: Diagnosis not present

## 2016-02-18 DIAGNOSIS — N186 End stage renal disease: Secondary | ICD-10-CM | POA: Diagnosis not present

## 2016-02-18 DIAGNOSIS — D631 Anemia in chronic kidney disease: Secondary | ICD-10-CM | POA: Diagnosis not present

## 2016-02-20 DIAGNOSIS — N186 End stage renal disease: Secondary | ICD-10-CM | POA: Diagnosis not present

## 2016-02-20 DIAGNOSIS — E1129 Type 2 diabetes mellitus with other diabetic kidney complication: Secondary | ICD-10-CM | POA: Diagnosis not present

## 2016-02-20 DIAGNOSIS — D631 Anemia in chronic kidney disease: Secondary | ICD-10-CM | POA: Diagnosis not present

## 2016-02-20 DIAGNOSIS — D509 Iron deficiency anemia, unspecified: Secondary | ICD-10-CM | POA: Diagnosis not present

## 2016-02-20 DIAGNOSIS — N2581 Secondary hyperparathyroidism of renal origin: Secondary | ICD-10-CM | POA: Diagnosis not present

## 2016-02-22 DIAGNOSIS — M542 Cervicalgia: Secondary | ICD-10-CM | POA: Diagnosis not present

## 2016-02-22 DIAGNOSIS — D631 Anemia in chronic kidney disease: Secondary | ICD-10-CM | POA: Diagnosis not present

## 2016-02-22 DIAGNOSIS — E1129 Type 2 diabetes mellitus with other diabetic kidney complication: Secondary | ICD-10-CM | POA: Diagnosis not present

## 2016-02-22 DIAGNOSIS — D509 Iron deficiency anemia, unspecified: Secondary | ICD-10-CM | POA: Diagnosis not present

## 2016-02-22 DIAGNOSIS — N186 End stage renal disease: Secondary | ICD-10-CM | POA: Diagnosis not present

## 2016-02-22 DIAGNOSIS — N2581 Secondary hyperparathyroidism of renal origin: Secondary | ICD-10-CM | POA: Diagnosis not present

## 2016-02-22 DIAGNOSIS — M545 Low back pain: Secondary | ICD-10-CM | POA: Diagnosis not present

## 2016-02-25 DIAGNOSIS — D509 Iron deficiency anemia, unspecified: Secondary | ICD-10-CM | POA: Diagnosis not present

## 2016-02-25 DIAGNOSIS — N186 End stage renal disease: Secondary | ICD-10-CM | POA: Diagnosis not present

## 2016-02-25 DIAGNOSIS — E1129 Type 2 diabetes mellitus with other diabetic kidney complication: Secondary | ICD-10-CM | POA: Diagnosis not present

## 2016-02-25 DIAGNOSIS — D631 Anemia in chronic kidney disease: Secondary | ICD-10-CM | POA: Diagnosis not present

## 2016-02-25 DIAGNOSIS — N2581 Secondary hyperparathyroidism of renal origin: Secondary | ICD-10-CM | POA: Diagnosis not present

## 2016-02-27 DIAGNOSIS — N2581 Secondary hyperparathyroidism of renal origin: Secondary | ICD-10-CM | POA: Diagnosis not present

## 2016-02-27 DIAGNOSIS — N186 End stage renal disease: Secondary | ICD-10-CM | POA: Diagnosis not present

## 2016-02-27 DIAGNOSIS — D509 Iron deficiency anemia, unspecified: Secondary | ICD-10-CM | POA: Diagnosis not present

## 2016-02-27 DIAGNOSIS — D631 Anemia in chronic kidney disease: Secondary | ICD-10-CM | POA: Diagnosis not present

## 2016-02-27 DIAGNOSIS — E1129 Type 2 diabetes mellitus with other diabetic kidney complication: Secondary | ICD-10-CM | POA: Diagnosis not present

## 2016-02-29 DIAGNOSIS — D631 Anemia in chronic kidney disease: Secondary | ICD-10-CM | POA: Diagnosis not present

## 2016-02-29 DIAGNOSIS — N186 End stage renal disease: Secondary | ICD-10-CM | POA: Diagnosis not present

## 2016-02-29 DIAGNOSIS — E1129 Type 2 diabetes mellitus with other diabetic kidney complication: Secondary | ICD-10-CM | POA: Diagnosis not present

## 2016-02-29 DIAGNOSIS — D509 Iron deficiency anemia, unspecified: Secondary | ICD-10-CM | POA: Diagnosis not present

## 2016-02-29 DIAGNOSIS — N2581 Secondary hyperparathyroidism of renal origin: Secondary | ICD-10-CM | POA: Diagnosis not present

## 2016-03-03 DIAGNOSIS — D509 Iron deficiency anemia, unspecified: Secondary | ICD-10-CM | POA: Diagnosis not present

## 2016-03-03 DIAGNOSIS — D631 Anemia in chronic kidney disease: Secondary | ICD-10-CM | POA: Diagnosis not present

## 2016-03-03 DIAGNOSIS — N2581 Secondary hyperparathyroidism of renal origin: Secondary | ICD-10-CM | POA: Diagnosis not present

## 2016-03-03 DIAGNOSIS — N186 End stage renal disease: Secondary | ICD-10-CM | POA: Diagnosis not present

## 2016-03-03 DIAGNOSIS — E1129 Type 2 diabetes mellitus with other diabetic kidney complication: Secondary | ICD-10-CM | POA: Diagnosis not present

## 2016-03-05 DIAGNOSIS — D631 Anemia in chronic kidney disease: Secondary | ICD-10-CM | POA: Diagnosis not present

## 2016-03-05 DIAGNOSIS — D509 Iron deficiency anemia, unspecified: Secondary | ICD-10-CM | POA: Diagnosis not present

## 2016-03-05 DIAGNOSIS — E1129 Type 2 diabetes mellitus with other diabetic kidney complication: Secondary | ICD-10-CM | POA: Diagnosis not present

## 2016-03-05 DIAGNOSIS — N2581 Secondary hyperparathyroidism of renal origin: Secondary | ICD-10-CM | POA: Diagnosis not present

## 2016-03-05 DIAGNOSIS — N186 End stage renal disease: Secondary | ICD-10-CM | POA: Diagnosis not present

## 2016-03-07 DIAGNOSIS — N2581 Secondary hyperparathyroidism of renal origin: Secondary | ICD-10-CM | POA: Diagnosis not present

## 2016-03-07 DIAGNOSIS — D631 Anemia in chronic kidney disease: Secondary | ICD-10-CM | POA: Diagnosis not present

## 2016-03-07 DIAGNOSIS — E1129 Type 2 diabetes mellitus with other diabetic kidney complication: Secondary | ICD-10-CM | POA: Diagnosis not present

## 2016-03-07 DIAGNOSIS — N186 End stage renal disease: Secondary | ICD-10-CM | POA: Diagnosis not present

## 2016-03-07 DIAGNOSIS — D509 Iron deficiency anemia, unspecified: Secondary | ICD-10-CM | POA: Diagnosis not present

## 2016-03-10 DIAGNOSIS — N186 End stage renal disease: Secondary | ICD-10-CM | POA: Diagnosis not present

## 2016-03-10 DIAGNOSIS — N2581 Secondary hyperparathyroidism of renal origin: Secondary | ICD-10-CM | POA: Diagnosis not present

## 2016-03-10 DIAGNOSIS — D509 Iron deficiency anemia, unspecified: Secondary | ICD-10-CM | POA: Diagnosis not present

## 2016-03-10 DIAGNOSIS — E1129 Type 2 diabetes mellitus with other diabetic kidney complication: Secondary | ICD-10-CM | POA: Diagnosis not present

## 2016-03-10 DIAGNOSIS — D631 Anemia in chronic kidney disease: Secondary | ICD-10-CM | POA: Diagnosis not present

## 2016-03-12 DIAGNOSIS — N186 End stage renal disease: Secondary | ICD-10-CM | POA: Diagnosis not present

## 2016-03-12 DIAGNOSIS — D509 Iron deficiency anemia, unspecified: Secondary | ICD-10-CM | POA: Diagnosis not present

## 2016-03-12 DIAGNOSIS — D631 Anemia in chronic kidney disease: Secondary | ICD-10-CM | POA: Diagnosis not present

## 2016-03-12 DIAGNOSIS — N2581 Secondary hyperparathyroidism of renal origin: Secondary | ICD-10-CM | POA: Diagnosis not present

## 2016-03-12 DIAGNOSIS — E1129 Type 2 diabetes mellitus with other diabetic kidney complication: Secondary | ICD-10-CM | POA: Diagnosis not present

## 2016-03-14 DIAGNOSIS — N186 End stage renal disease: Secondary | ICD-10-CM | POA: Diagnosis not present

## 2016-03-14 DIAGNOSIS — E1129 Type 2 diabetes mellitus with other diabetic kidney complication: Secondary | ICD-10-CM | POA: Diagnosis not present

## 2016-03-14 DIAGNOSIS — N2581 Secondary hyperparathyroidism of renal origin: Secondary | ICD-10-CM | POA: Diagnosis not present

## 2016-03-14 DIAGNOSIS — D509 Iron deficiency anemia, unspecified: Secondary | ICD-10-CM | POA: Diagnosis not present

## 2016-03-14 DIAGNOSIS — D631 Anemia in chronic kidney disease: Secondary | ICD-10-CM | POA: Diagnosis not present

## 2016-03-15 DIAGNOSIS — E1129 Type 2 diabetes mellitus with other diabetic kidney complication: Secondary | ICD-10-CM | POA: Diagnosis not present

## 2016-03-15 DIAGNOSIS — Z992 Dependence on renal dialysis: Secondary | ICD-10-CM | POA: Diagnosis not present

## 2016-03-15 DIAGNOSIS — N186 End stage renal disease: Secondary | ICD-10-CM | POA: Diagnosis not present

## 2016-03-17 DIAGNOSIS — D631 Anemia in chronic kidney disease: Secondary | ICD-10-CM | POA: Diagnosis not present

## 2016-03-17 DIAGNOSIS — N2581 Secondary hyperparathyroidism of renal origin: Secondary | ICD-10-CM | POA: Diagnosis not present

## 2016-03-17 DIAGNOSIS — E1129 Type 2 diabetes mellitus with other diabetic kidney complication: Secondary | ICD-10-CM | POA: Diagnosis not present

## 2016-03-17 DIAGNOSIS — D509 Iron deficiency anemia, unspecified: Secondary | ICD-10-CM | POA: Diagnosis not present

## 2016-03-17 DIAGNOSIS — N186 End stage renal disease: Secondary | ICD-10-CM | POA: Diagnosis not present

## 2016-03-19 DIAGNOSIS — E1129 Type 2 diabetes mellitus with other diabetic kidney complication: Secondary | ICD-10-CM | POA: Diagnosis not present

## 2016-03-19 DIAGNOSIS — N2581 Secondary hyperparathyroidism of renal origin: Secondary | ICD-10-CM | POA: Diagnosis not present

## 2016-03-19 DIAGNOSIS — N186 End stage renal disease: Secondary | ICD-10-CM | POA: Diagnosis not present

## 2016-03-19 DIAGNOSIS — D509 Iron deficiency anemia, unspecified: Secondary | ICD-10-CM | POA: Diagnosis not present

## 2016-03-19 DIAGNOSIS — D631 Anemia in chronic kidney disease: Secondary | ICD-10-CM | POA: Diagnosis not present

## 2016-03-21 DIAGNOSIS — D631 Anemia in chronic kidney disease: Secondary | ICD-10-CM | POA: Diagnosis not present

## 2016-03-21 DIAGNOSIS — N186 End stage renal disease: Secondary | ICD-10-CM | POA: Diagnosis not present

## 2016-03-21 DIAGNOSIS — E1129 Type 2 diabetes mellitus with other diabetic kidney complication: Secondary | ICD-10-CM | POA: Diagnosis not present

## 2016-03-21 DIAGNOSIS — D509 Iron deficiency anemia, unspecified: Secondary | ICD-10-CM | POA: Diagnosis not present

## 2016-03-21 DIAGNOSIS — N2581 Secondary hyperparathyroidism of renal origin: Secondary | ICD-10-CM | POA: Diagnosis not present

## 2016-03-24 DIAGNOSIS — N2581 Secondary hyperparathyroidism of renal origin: Secondary | ICD-10-CM | POA: Diagnosis not present

## 2016-03-24 DIAGNOSIS — D631 Anemia in chronic kidney disease: Secondary | ICD-10-CM | POA: Diagnosis not present

## 2016-03-24 DIAGNOSIS — N186 End stage renal disease: Secondary | ICD-10-CM | POA: Diagnosis not present

## 2016-03-24 DIAGNOSIS — D509 Iron deficiency anemia, unspecified: Secondary | ICD-10-CM | POA: Diagnosis not present

## 2016-03-24 DIAGNOSIS — E1129 Type 2 diabetes mellitus with other diabetic kidney complication: Secondary | ICD-10-CM | POA: Diagnosis not present

## 2016-03-26 DIAGNOSIS — N2581 Secondary hyperparathyroidism of renal origin: Secondary | ICD-10-CM | POA: Diagnosis not present

## 2016-03-26 DIAGNOSIS — E1129 Type 2 diabetes mellitus with other diabetic kidney complication: Secondary | ICD-10-CM | POA: Diagnosis not present

## 2016-03-26 DIAGNOSIS — D631 Anemia in chronic kidney disease: Secondary | ICD-10-CM | POA: Diagnosis not present

## 2016-03-26 DIAGNOSIS — N186 End stage renal disease: Secondary | ICD-10-CM | POA: Diagnosis not present

## 2016-03-26 DIAGNOSIS — D509 Iron deficiency anemia, unspecified: Secondary | ICD-10-CM | POA: Diagnosis not present

## 2016-03-28 DIAGNOSIS — E1129 Type 2 diabetes mellitus with other diabetic kidney complication: Secondary | ICD-10-CM | POA: Diagnosis not present

## 2016-03-28 DIAGNOSIS — D631 Anemia in chronic kidney disease: Secondary | ICD-10-CM | POA: Diagnosis not present

## 2016-03-28 DIAGNOSIS — N2581 Secondary hyperparathyroidism of renal origin: Secondary | ICD-10-CM | POA: Diagnosis not present

## 2016-03-28 DIAGNOSIS — N186 End stage renal disease: Secondary | ICD-10-CM | POA: Diagnosis not present

## 2016-03-28 DIAGNOSIS — D509 Iron deficiency anemia, unspecified: Secondary | ICD-10-CM | POA: Diagnosis not present

## 2016-03-31 DIAGNOSIS — D509 Iron deficiency anemia, unspecified: Secondary | ICD-10-CM | POA: Diagnosis not present

## 2016-03-31 DIAGNOSIS — D631 Anemia in chronic kidney disease: Secondary | ICD-10-CM | POA: Diagnosis not present

## 2016-03-31 DIAGNOSIS — N186 End stage renal disease: Secondary | ICD-10-CM | POA: Diagnosis not present

## 2016-03-31 DIAGNOSIS — E1129 Type 2 diabetes mellitus with other diabetic kidney complication: Secondary | ICD-10-CM | POA: Diagnosis not present

## 2016-03-31 DIAGNOSIS — N2581 Secondary hyperparathyroidism of renal origin: Secondary | ICD-10-CM | POA: Diagnosis not present

## 2016-04-02 DIAGNOSIS — N186 End stage renal disease: Secondary | ICD-10-CM | POA: Diagnosis not present

## 2016-04-02 DIAGNOSIS — D631 Anemia in chronic kidney disease: Secondary | ICD-10-CM | POA: Diagnosis not present

## 2016-04-02 DIAGNOSIS — D509 Iron deficiency anemia, unspecified: Secondary | ICD-10-CM | POA: Diagnosis not present

## 2016-04-02 DIAGNOSIS — N2581 Secondary hyperparathyroidism of renal origin: Secondary | ICD-10-CM | POA: Diagnosis not present

## 2016-04-02 DIAGNOSIS — E1129 Type 2 diabetes mellitus with other diabetic kidney complication: Secondary | ICD-10-CM | POA: Diagnosis not present

## 2016-04-04 DIAGNOSIS — E1129 Type 2 diabetes mellitus with other diabetic kidney complication: Secondary | ICD-10-CM | POA: Diagnosis not present

## 2016-04-04 DIAGNOSIS — N186 End stage renal disease: Secondary | ICD-10-CM | POA: Diagnosis not present

## 2016-04-04 DIAGNOSIS — D509 Iron deficiency anemia, unspecified: Secondary | ICD-10-CM | POA: Diagnosis not present

## 2016-04-04 DIAGNOSIS — D631 Anemia in chronic kidney disease: Secondary | ICD-10-CM | POA: Diagnosis not present

## 2016-04-04 DIAGNOSIS — N2581 Secondary hyperparathyroidism of renal origin: Secondary | ICD-10-CM | POA: Diagnosis not present

## 2016-04-06 DIAGNOSIS — E1121 Type 2 diabetes mellitus with diabetic nephropathy: Secondary | ICD-10-CM | POA: Diagnosis not present

## 2016-04-06 DIAGNOSIS — E785 Hyperlipidemia, unspecified: Secondary | ICD-10-CM | POA: Diagnosis not present

## 2016-04-06 DIAGNOSIS — K219 Gastro-esophageal reflux disease without esophagitis: Secondary | ICD-10-CM | POA: Diagnosis not present

## 2016-04-06 DIAGNOSIS — I509 Heart failure, unspecified: Secondary | ICD-10-CM | POA: Diagnosis not present

## 2016-04-06 DIAGNOSIS — Z681 Body mass index (BMI) 19 or less, adult: Secondary | ICD-10-CM | POA: Diagnosis not present

## 2016-04-06 DIAGNOSIS — Z89521 Acquired absence of right knee: Secondary | ICD-10-CM | POA: Diagnosis not present

## 2016-04-06 DIAGNOSIS — I739 Peripheral vascular disease, unspecified: Secondary | ICD-10-CM | POA: Diagnosis not present

## 2016-04-06 DIAGNOSIS — G629 Polyneuropathy, unspecified: Secondary | ICD-10-CM | POA: Diagnosis not present

## 2016-04-06 DIAGNOSIS — N186 End stage renal disease: Secondary | ICD-10-CM | POA: Diagnosis not present

## 2016-04-06 DIAGNOSIS — J449 Chronic obstructive pulmonary disease, unspecified: Secondary | ICD-10-CM | POA: Diagnosis not present

## 2016-04-06 DIAGNOSIS — J189 Pneumonia, unspecified organism: Secondary | ICD-10-CM | POA: Diagnosis not present

## 2016-04-06 DIAGNOSIS — H3530 Unspecified macular degeneration: Secondary | ICD-10-CM | POA: Diagnosis not present

## 2016-04-07 DIAGNOSIS — D631 Anemia in chronic kidney disease: Secondary | ICD-10-CM | POA: Diagnosis not present

## 2016-04-07 DIAGNOSIS — E1129 Type 2 diabetes mellitus with other diabetic kidney complication: Secondary | ICD-10-CM | POA: Diagnosis not present

## 2016-04-07 DIAGNOSIS — D509 Iron deficiency anemia, unspecified: Secondary | ICD-10-CM | POA: Diagnosis not present

## 2016-04-07 DIAGNOSIS — N2581 Secondary hyperparathyroidism of renal origin: Secondary | ICD-10-CM | POA: Diagnosis not present

## 2016-04-07 DIAGNOSIS — N186 End stage renal disease: Secondary | ICD-10-CM | POA: Diagnosis not present

## 2016-04-09 DIAGNOSIS — D631 Anemia in chronic kidney disease: Secondary | ICD-10-CM | POA: Diagnosis not present

## 2016-04-09 DIAGNOSIS — N2581 Secondary hyperparathyroidism of renal origin: Secondary | ICD-10-CM | POA: Diagnosis not present

## 2016-04-09 DIAGNOSIS — D509 Iron deficiency anemia, unspecified: Secondary | ICD-10-CM | POA: Diagnosis not present

## 2016-04-09 DIAGNOSIS — N186 End stage renal disease: Secondary | ICD-10-CM | POA: Diagnosis not present

## 2016-04-09 DIAGNOSIS — E1129 Type 2 diabetes mellitus with other diabetic kidney complication: Secondary | ICD-10-CM | POA: Diagnosis not present

## 2016-04-10 DIAGNOSIS — J189 Pneumonia, unspecified organism: Secondary | ICD-10-CM | POA: Diagnosis not present

## 2016-04-10 DIAGNOSIS — I517 Cardiomegaly: Secondary | ICD-10-CM | POA: Diagnosis not present

## 2016-04-11 DIAGNOSIS — D631 Anemia in chronic kidney disease: Secondary | ICD-10-CM | POA: Diagnosis not present

## 2016-04-11 DIAGNOSIS — D509 Iron deficiency anemia, unspecified: Secondary | ICD-10-CM | POA: Diagnosis not present

## 2016-04-11 DIAGNOSIS — N186 End stage renal disease: Secondary | ICD-10-CM | POA: Diagnosis not present

## 2016-04-11 DIAGNOSIS — N2581 Secondary hyperparathyroidism of renal origin: Secondary | ICD-10-CM | POA: Diagnosis not present

## 2016-04-11 DIAGNOSIS — E1129 Type 2 diabetes mellitus with other diabetic kidney complication: Secondary | ICD-10-CM | POA: Diagnosis not present

## 2016-04-14 DIAGNOSIS — E1129 Type 2 diabetes mellitus with other diabetic kidney complication: Secondary | ICD-10-CM | POA: Diagnosis not present

## 2016-04-14 DIAGNOSIS — D631 Anemia in chronic kidney disease: Secondary | ICD-10-CM | POA: Diagnosis not present

## 2016-04-14 DIAGNOSIS — N186 End stage renal disease: Secondary | ICD-10-CM | POA: Diagnosis not present

## 2016-04-14 DIAGNOSIS — N2581 Secondary hyperparathyroidism of renal origin: Secondary | ICD-10-CM | POA: Diagnosis not present

## 2016-04-14 DIAGNOSIS — D509 Iron deficiency anemia, unspecified: Secondary | ICD-10-CM | POA: Diagnosis not present

## 2016-04-15 DIAGNOSIS — E1129 Type 2 diabetes mellitus with other diabetic kidney complication: Secondary | ICD-10-CM | POA: Diagnosis not present

## 2016-04-15 DIAGNOSIS — N186 End stage renal disease: Secondary | ICD-10-CM | POA: Diagnosis not present

## 2016-04-15 DIAGNOSIS — Z992 Dependence on renal dialysis: Secondary | ICD-10-CM | POA: Diagnosis not present

## 2016-04-16 DIAGNOSIS — D509 Iron deficiency anemia, unspecified: Secondary | ICD-10-CM | POA: Diagnosis not present

## 2016-04-16 DIAGNOSIS — N186 End stage renal disease: Secondary | ICD-10-CM | POA: Diagnosis not present

## 2016-04-16 DIAGNOSIS — D631 Anemia in chronic kidney disease: Secondary | ICD-10-CM | POA: Diagnosis not present

## 2016-04-16 DIAGNOSIS — E1129 Type 2 diabetes mellitus with other diabetic kidney complication: Secondary | ICD-10-CM | POA: Diagnosis not present

## 2016-04-16 DIAGNOSIS — N2581 Secondary hyperparathyroidism of renal origin: Secondary | ICD-10-CM | POA: Diagnosis not present

## 2016-04-18 DIAGNOSIS — N186 End stage renal disease: Secondary | ICD-10-CM | POA: Diagnosis not present

## 2016-04-18 DIAGNOSIS — D631 Anemia in chronic kidney disease: Secondary | ICD-10-CM | POA: Diagnosis not present

## 2016-04-18 DIAGNOSIS — E1129 Type 2 diabetes mellitus with other diabetic kidney complication: Secondary | ICD-10-CM | POA: Diagnosis not present

## 2016-04-18 DIAGNOSIS — N2581 Secondary hyperparathyroidism of renal origin: Secondary | ICD-10-CM | POA: Diagnosis not present

## 2016-04-18 DIAGNOSIS — I159 Secondary hypertension, unspecified: Secondary | ICD-10-CM | POA: Diagnosis not present

## 2016-04-18 DIAGNOSIS — M79603 Pain in arm, unspecified: Secondary | ICD-10-CM | POA: Diagnosis not present

## 2016-04-18 DIAGNOSIS — R58 Hemorrhage, not elsewhere classified: Secondary | ICD-10-CM | POA: Diagnosis not present

## 2016-04-18 DIAGNOSIS — T82838A Hemorrhage of vascular prosthetic devices, implants and grafts, initial encounter: Secondary | ICD-10-CM | POA: Diagnosis not present

## 2016-04-18 DIAGNOSIS — D509 Iron deficiency anemia, unspecified: Secondary | ICD-10-CM | POA: Diagnosis not present

## 2016-04-21 DIAGNOSIS — E1129 Type 2 diabetes mellitus with other diabetic kidney complication: Secondary | ICD-10-CM | POA: Diagnosis not present

## 2016-04-21 DIAGNOSIS — N186 End stage renal disease: Secondary | ICD-10-CM | POA: Diagnosis not present

## 2016-04-21 DIAGNOSIS — N2581 Secondary hyperparathyroidism of renal origin: Secondary | ICD-10-CM | POA: Diagnosis not present

## 2016-04-21 DIAGNOSIS — D509 Iron deficiency anemia, unspecified: Secondary | ICD-10-CM | POA: Diagnosis not present

## 2016-04-21 DIAGNOSIS — D631 Anemia in chronic kidney disease: Secondary | ICD-10-CM | POA: Diagnosis not present

## 2016-04-23 DIAGNOSIS — N186 End stage renal disease: Secondary | ICD-10-CM | POA: Diagnosis not present

## 2016-04-23 DIAGNOSIS — D509 Iron deficiency anemia, unspecified: Secondary | ICD-10-CM | POA: Diagnosis not present

## 2016-04-23 DIAGNOSIS — D631 Anemia in chronic kidney disease: Secondary | ICD-10-CM | POA: Diagnosis not present

## 2016-04-23 DIAGNOSIS — E1129 Type 2 diabetes mellitus with other diabetic kidney complication: Secondary | ICD-10-CM | POA: Diagnosis not present

## 2016-04-23 DIAGNOSIS — N2581 Secondary hyperparathyroidism of renal origin: Secondary | ICD-10-CM | POA: Diagnosis not present

## 2016-04-25 DIAGNOSIS — D509 Iron deficiency anemia, unspecified: Secondary | ICD-10-CM | POA: Diagnosis not present

## 2016-04-25 DIAGNOSIS — E1129 Type 2 diabetes mellitus with other diabetic kidney complication: Secondary | ICD-10-CM | POA: Diagnosis not present

## 2016-04-25 DIAGNOSIS — N186 End stage renal disease: Secondary | ICD-10-CM | POA: Diagnosis not present

## 2016-04-25 DIAGNOSIS — D631 Anemia in chronic kidney disease: Secondary | ICD-10-CM | POA: Diagnosis not present

## 2016-04-25 DIAGNOSIS — N2581 Secondary hyperparathyroidism of renal origin: Secondary | ICD-10-CM | POA: Diagnosis not present

## 2016-04-28 DIAGNOSIS — N186 End stage renal disease: Secondary | ICD-10-CM | POA: Diagnosis not present

## 2016-04-28 DIAGNOSIS — E1129 Type 2 diabetes mellitus with other diabetic kidney complication: Secondary | ICD-10-CM | POA: Diagnosis not present

## 2016-04-28 DIAGNOSIS — D509 Iron deficiency anemia, unspecified: Secondary | ICD-10-CM | POA: Diagnosis not present

## 2016-04-28 DIAGNOSIS — N2581 Secondary hyperparathyroidism of renal origin: Secondary | ICD-10-CM | POA: Diagnosis not present

## 2016-04-28 DIAGNOSIS — D631 Anemia in chronic kidney disease: Secondary | ICD-10-CM | POA: Diagnosis not present

## 2016-04-30 DIAGNOSIS — N2581 Secondary hyperparathyroidism of renal origin: Secondary | ICD-10-CM | POA: Diagnosis not present

## 2016-04-30 DIAGNOSIS — E1129 Type 2 diabetes mellitus with other diabetic kidney complication: Secondary | ICD-10-CM | POA: Diagnosis not present

## 2016-04-30 DIAGNOSIS — D509 Iron deficiency anemia, unspecified: Secondary | ICD-10-CM | POA: Diagnosis not present

## 2016-04-30 DIAGNOSIS — N186 End stage renal disease: Secondary | ICD-10-CM | POA: Diagnosis not present

## 2016-04-30 DIAGNOSIS — D631 Anemia in chronic kidney disease: Secondary | ICD-10-CM | POA: Diagnosis not present

## 2016-05-02 DIAGNOSIS — D509 Iron deficiency anemia, unspecified: Secondary | ICD-10-CM | POA: Diagnosis not present

## 2016-05-02 DIAGNOSIS — N2581 Secondary hyperparathyroidism of renal origin: Secondary | ICD-10-CM | POA: Diagnosis not present

## 2016-05-02 DIAGNOSIS — N186 End stage renal disease: Secondary | ICD-10-CM | POA: Diagnosis not present

## 2016-05-02 DIAGNOSIS — E1129 Type 2 diabetes mellitus with other diabetic kidney complication: Secondary | ICD-10-CM | POA: Diagnosis not present

## 2016-05-02 DIAGNOSIS — D631 Anemia in chronic kidney disease: Secondary | ICD-10-CM | POA: Diagnosis not present

## 2016-05-05 DIAGNOSIS — D509 Iron deficiency anemia, unspecified: Secondary | ICD-10-CM | POA: Diagnosis not present

## 2016-05-05 DIAGNOSIS — N2581 Secondary hyperparathyroidism of renal origin: Secondary | ICD-10-CM | POA: Diagnosis not present

## 2016-05-05 DIAGNOSIS — D631 Anemia in chronic kidney disease: Secondary | ICD-10-CM | POA: Diagnosis not present

## 2016-05-05 DIAGNOSIS — N186 End stage renal disease: Secondary | ICD-10-CM | POA: Diagnosis not present

## 2016-05-05 DIAGNOSIS — E1129 Type 2 diabetes mellitus with other diabetic kidney complication: Secondary | ICD-10-CM | POA: Diagnosis not present

## 2016-05-07 DIAGNOSIS — D509 Iron deficiency anemia, unspecified: Secondary | ICD-10-CM | POA: Diagnosis not present

## 2016-05-07 DIAGNOSIS — N2581 Secondary hyperparathyroidism of renal origin: Secondary | ICD-10-CM | POA: Diagnosis not present

## 2016-05-07 DIAGNOSIS — D631 Anemia in chronic kidney disease: Secondary | ICD-10-CM | POA: Diagnosis not present

## 2016-05-07 DIAGNOSIS — E1129 Type 2 diabetes mellitus with other diabetic kidney complication: Secondary | ICD-10-CM | POA: Diagnosis not present

## 2016-05-07 DIAGNOSIS — N186 End stage renal disease: Secondary | ICD-10-CM | POA: Diagnosis not present

## 2016-05-09 DIAGNOSIS — N186 End stage renal disease: Secondary | ICD-10-CM | POA: Diagnosis not present

## 2016-05-09 DIAGNOSIS — N2581 Secondary hyperparathyroidism of renal origin: Secondary | ICD-10-CM | POA: Diagnosis not present

## 2016-05-09 DIAGNOSIS — E1129 Type 2 diabetes mellitus with other diabetic kidney complication: Secondary | ICD-10-CM | POA: Diagnosis not present

## 2016-05-09 DIAGNOSIS — D509 Iron deficiency anemia, unspecified: Secondary | ICD-10-CM | POA: Diagnosis not present

## 2016-05-09 DIAGNOSIS — D631 Anemia in chronic kidney disease: Secondary | ICD-10-CM | POA: Diagnosis not present

## 2016-05-12 DIAGNOSIS — D509 Iron deficiency anemia, unspecified: Secondary | ICD-10-CM | POA: Diagnosis not present

## 2016-05-12 DIAGNOSIS — N2581 Secondary hyperparathyroidism of renal origin: Secondary | ICD-10-CM | POA: Diagnosis not present

## 2016-05-12 DIAGNOSIS — E1129 Type 2 diabetes mellitus with other diabetic kidney complication: Secondary | ICD-10-CM | POA: Diagnosis not present

## 2016-05-12 DIAGNOSIS — D631 Anemia in chronic kidney disease: Secondary | ICD-10-CM | POA: Diagnosis not present

## 2016-05-12 DIAGNOSIS — N186 End stage renal disease: Secondary | ICD-10-CM | POA: Diagnosis not present

## 2016-05-14 DIAGNOSIS — N186 End stage renal disease: Secondary | ICD-10-CM | POA: Diagnosis not present

## 2016-05-14 DIAGNOSIS — N2581 Secondary hyperparathyroidism of renal origin: Secondary | ICD-10-CM | POA: Diagnosis not present

## 2016-05-14 DIAGNOSIS — D509 Iron deficiency anemia, unspecified: Secondary | ICD-10-CM | POA: Diagnosis not present

## 2016-05-14 DIAGNOSIS — E1129 Type 2 diabetes mellitus with other diabetic kidney complication: Secondary | ICD-10-CM | POA: Diagnosis not present

## 2016-05-14 DIAGNOSIS — D631 Anemia in chronic kidney disease: Secondary | ICD-10-CM | POA: Diagnosis not present

## 2016-05-15 DIAGNOSIS — Z992 Dependence on renal dialysis: Secondary | ICD-10-CM | POA: Diagnosis not present

## 2016-05-15 DIAGNOSIS — E1129 Type 2 diabetes mellitus with other diabetic kidney complication: Secondary | ICD-10-CM | POA: Diagnosis not present

## 2016-05-15 DIAGNOSIS — N186 End stage renal disease: Secondary | ICD-10-CM | POA: Diagnosis not present

## 2016-05-19 DIAGNOSIS — N186 End stage renal disease: Secondary | ICD-10-CM | POA: Diagnosis not present

## 2016-05-19 DIAGNOSIS — D509 Iron deficiency anemia, unspecified: Secondary | ICD-10-CM | POA: Diagnosis not present

## 2016-05-19 DIAGNOSIS — N2581 Secondary hyperparathyroidism of renal origin: Secondary | ICD-10-CM | POA: Diagnosis not present

## 2016-05-19 DIAGNOSIS — D631 Anemia in chronic kidney disease: Secondary | ICD-10-CM | POA: Diagnosis not present

## 2016-05-21 DIAGNOSIS — D509 Iron deficiency anemia, unspecified: Secondary | ICD-10-CM | POA: Diagnosis not present

## 2016-05-21 DIAGNOSIS — D631 Anemia in chronic kidney disease: Secondary | ICD-10-CM | POA: Diagnosis not present

## 2016-05-21 DIAGNOSIS — N2581 Secondary hyperparathyroidism of renal origin: Secondary | ICD-10-CM | POA: Diagnosis not present

## 2016-05-21 DIAGNOSIS — N186 End stage renal disease: Secondary | ICD-10-CM | POA: Diagnosis not present

## 2016-05-23 DIAGNOSIS — D509 Iron deficiency anemia, unspecified: Secondary | ICD-10-CM | POA: Diagnosis not present

## 2016-05-23 DIAGNOSIS — D631 Anemia in chronic kidney disease: Secondary | ICD-10-CM | POA: Diagnosis not present

## 2016-05-23 DIAGNOSIS — N2581 Secondary hyperparathyroidism of renal origin: Secondary | ICD-10-CM | POA: Diagnosis not present

## 2016-05-23 DIAGNOSIS — N186 End stage renal disease: Secondary | ICD-10-CM | POA: Diagnosis not present

## 2016-05-26 DIAGNOSIS — N2581 Secondary hyperparathyroidism of renal origin: Secondary | ICD-10-CM | POA: Diagnosis not present

## 2016-05-26 DIAGNOSIS — D631 Anemia in chronic kidney disease: Secondary | ICD-10-CM | POA: Diagnosis not present

## 2016-05-26 DIAGNOSIS — D509 Iron deficiency anemia, unspecified: Secondary | ICD-10-CM | POA: Diagnosis not present

## 2016-05-26 DIAGNOSIS — N186 End stage renal disease: Secondary | ICD-10-CM | POA: Diagnosis not present

## 2016-05-28 DIAGNOSIS — N186 End stage renal disease: Secondary | ICD-10-CM | POA: Diagnosis not present

## 2016-05-28 DIAGNOSIS — N2581 Secondary hyperparathyroidism of renal origin: Secondary | ICD-10-CM | POA: Diagnosis not present

## 2016-05-28 DIAGNOSIS — D631 Anemia in chronic kidney disease: Secondary | ICD-10-CM | POA: Diagnosis not present

## 2016-05-28 DIAGNOSIS — D509 Iron deficiency anemia, unspecified: Secondary | ICD-10-CM | POA: Diagnosis not present

## 2016-06-02 DIAGNOSIS — D509 Iron deficiency anemia, unspecified: Secondary | ICD-10-CM | POA: Diagnosis not present

## 2016-06-02 DIAGNOSIS — R58 Hemorrhage, not elsewhere classified: Secondary | ICD-10-CM | POA: Diagnosis not present

## 2016-06-02 DIAGNOSIS — N2581 Secondary hyperparathyroidism of renal origin: Secondary | ICD-10-CM | POA: Diagnosis not present

## 2016-06-02 DIAGNOSIS — D631 Anemia in chronic kidney disease: Secondary | ICD-10-CM | POA: Diagnosis not present

## 2016-06-02 DIAGNOSIS — N186 End stage renal disease: Secondary | ICD-10-CM | POA: Diagnosis not present

## 2016-06-02 DIAGNOSIS — T8189XA Other complications of procedures, not elsewhere classified, initial encounter: Secondary | ICD-10-CM | POA: Diagnosis not present

## 2016-06-02 DIAGNOSIS — T82838A Hemorrhage of vascular prosthetic devices, implants and grafts, initial encounter: Secondary | ICD-10-CM | POA: Diagnosis not present

## 2016-06-04 DIAGNOSIS — D509 Iron deficiency anemia, unspecified: Secondary | ICD-10-CM | POA: Diagnosis not present

## 2016-06-04 DIAGNOSIS — N2581 Secondary hyperparathyroidism of renal origin: Secondary | ICD-10-CM | POA: Diagnosis not present

## 2016-06-04 DIAGNOSIS — D631 Anemia in chronic kidney disease: Secondary | ICD-10-CM | POA: Diagnosis not present

## 2016-06-04 DIAGNOSIS — N186 End stage renal disease: Secondary | ICD-10-CM | POA: Diagnosis not present

## 2016-06-06 DIAGNOSIS — D509 Iron deficiency anemia, unspecified: Secondary | ICD-10-CM | POA: Diagnosis not present

## 2016-06-06 DIAGNOSIS — N186 End stage renal disease: Secondary | ICD-10-CM | POA: Diagnosis not present

## 2016-06-06 DIAGNOSIS — D631 Anemia in chronic kidney disease: Secondary | ICD-10-CM | POA: Diagnosis not present

## 2016-06-06 DIAGNOSIS — N2581 Secondary hyperparathyroidism of renal origin: Secondary | ICD-10-CM | POA: Diagnosis not present

## 2016-06-09 DIAGNOSIS — D631 Anemia in chronic kidney disease: Secondary | ICD-10-CM | POA: Diagnosis not present

## 2016-06-09 DIAGNOSIS — N186 End stage renal disease: Secondary | ICD-10-CM | POA: Diagnosis not present

## 2016-06-09 DIAGNOSIS — D509 Iron deficiency anemia, unspecified: Secondary | ICD-10-CM | POA: Diagnosis not present

## 2016-06-09 DIAGNOSIS — N2581 Secondary hyperparathyroidism of renal origin: Secondary | ICD-10-CM | POA: Diagnosis not present

## 2016-06-11 DIAGNOSIS — E1129 Type 2 diabetes mellitus with other diabetic kidney complication: Secondary | ICD-10-CM | POA: Diagnosis not present

## 2016-06-11 DIAGNOSIS — D509 Iron deficiency anemia, unspecified: Secondary | ICD-10-CM | POA: Diagnosis not present

## 2016-06-11 DIAGNOSIS — D631 Anemia in chronic kidney disease: Secondary | ICD-10-CM | POA: Diagnosis not present

## 2016-06-11 DIAGNOSIS — N186 End stage renal disease: Secondary | ICD-10-CM | POA: Diagnosis not present

## 2016-06-11 DIAGNOSIS — N2581 Secondary hyperparathyroidism of renal origin: Secondary | ICD-10-CM | POA: Diagnosis not present

## 2016-06-13 DIAGNOSIS — D509 Iron deficiency anemia, unspecified: Secondary | ICD-10-CM | POA: Diagnosis not present

## 2016-06-13 DIAGNOSIS — D631 Anemia in chronic kidney disease: Secondary | ICD-10-CM | POA: Diagnosis not present

## 2016-06-13 DIAGNOSIS — N186 End stage renal disease: Secondary | ICD-10-CM | POA: Diagnosis not present

## 2016-06-13 DIAGNOSIS — N2581 Secondary hyperparathyroidism of renal origin: Secondary | ICD-10-CM | POA: Diagnosis not present

## 2016-06-15 DIAGNOSIS — E1129 Type 2 diabetes mellitus with other diabetic kidney complication: Secondary | ICD-10-CM | POA: Diagnosis not present

## 2016-06-15 DIAGNOSIS — N186 End stage renal disease: Secondary | ICD-10-CM | POA: Diagnosis not present

## 2016-06-15 DIAGNOSIS — Z992 Dependence on renal dialysis: Secondary | ICD-10-CM | POA: Diagnosis not present

## 2016-06-16 DIAGNOSIS — E1129 Type 2 diabetes mellitus with other diabetic kidney complication: Secondary | ICD-10-CM | POA: Diagnosis not present

## 2016-06-16 DIAGNOSIS — N2581 Secondary hyperparathyroidism of renal origin: Secondary | ICD-10-CM | POA: Diagnosis not present

## 2016-06-16 DIAGNOSIS — D631 Anemia in chronic kidney disease: Secondary | ICD-10-CM | POA: Diagnosis not present

## 2016-06-16 DIAGNOSIS — D509 Iron deficiency anemia, unspecified: Secondary | ICD-10-CM | POA: Diagnosis not present

## 2016-06-16 DIAGNOSIS — N186 End stage renal disease: Secondary | ICD-10-CM | POA: Diagnosis not present

## 2016-06-17 DIAGNOSIS — Z992 Dependence on renal dialysis: Secondary | ICD-10-CM | POA: Diagnosis not present

## 2016-06-17 DIAGNOSIS — N186 End stage renal disease: Secondary | ICD-10-CM | POA: Diagnosis not present

## 2016-06-17 DIAGNOSIS — I871 Compression of vein: Secondary | ICD-10-CM | POA: Diagnosis not present

## 2016-06-17 DIAGNOSIS — T82858D Stenosis of vascular prosthetic devices, implants and grafts, subsequent encounter: Secondary | ICD-10-CM | POA: Diagnosis not present

## 2016-06-18 DIAGNOSIS — D631 Anemia in chronic kidney disease: Secondary | ICD-10-CM | POA: Diagnosis not present

## 2016-06-18 DIAGNOSIS — D509 Iron deficiency anemia, unspecified: Secondary | ICD-10-CM | POA: Diagnosis not present

## 2016-06-18 DIAGNOSIS — N2581 Secondary hyperparathyroidism of renal origin: Secondary | ICD-10-CM | POA: Diagnosis not present

## 2016-06-18 DIAGNOSIS — E1129 Type 2 diabetes mellitus with other diabetic kidney complication: Secondary | ICD-10-CM | POA: Diagnosis not present

## 2016-06-18 DIAGNOSIS — N186 End stage renal disease: Secondary | ICD-10-CM | POA: Diagnosis not present

## 2016-06-20 DIAGNOSIS — D509 Iron deficiency anemia, unspecified: Secondary | ICD-10-CM | POA: Diagnosis not present

## 2016-06-20 DIAGNOSIS — E1129 Type 2 diabetes mellitus with other diabetic kidney complication: Secondary | ICD-10-CM | POA: Diagnosis not present

## 2016-06-20 DIAGNOSIS — D631 Anemia in chronic kidney disease: Secondary | ICD-10-CM | POA: Diagnosis not present

## 2016-06-20 DIAGNOSIS — N186 End stage renal disease: Secondary | ICD-10-CM | POA: Diagnosis not present

## 2016-06-20 DIAGNOSIS — N2581 Secondary hyperparathyroidism of renal origin: Secondary | ICD-10-CM | POA: Diagnosis not present

## 2016-06-23 DIAGNOSIS — D509 Iron deficiency anemia, unspecified: Secondary | ICD-10-CM | POA: Diagnosis not present

## 2016-06-23 DIAGNOSIS — N186 End stage renal disease: Secondary | ICD-10-CM | POA: Diagnosis not present

## 2016-06-23 DIAGNOSIS — E1129 Type 2 diabetes mellitus with other diabetic kidney complication: Secondary | ICD-10-CM | POA: Diagnosis not present

## 2016-06-23 DIAGNOSIS — D631 Anemia in chronic kidney disease: Secondary | ICD-10-CM | POA: Diagnosis not present

## 2016-06-23 DIAGNOSIS — N2581 Secondary hyperparathyroidism of renal origin: Secondary | ICD-10-CM | POA: Diagnosis not present

## 2016-06-25 DIAGNOSIS — D509 Iron deficiency anemia, unspecified: Secondary | ICD-10-CM | POA: Diagnosis not present

## 2016-06-25 DIAGNOSIS — N186 End stage renal disease: Secondary | ICD-10-CM | POA: Diagnosis not present

## 2016-06-25 DIAGNOSIS — D631 Anemia in chronic kidney disease: Secondary | ICD-10-CM | POA: Diagnosis not present

## 2016-06-25 DIAGNOSIS — N2581 Secondary hyperparathyroidism of renal origin: Secondary | ICD-10-CM | POA: Diagnosis not present

## 2016-06-25 DIAGNOSIS — E1129 Type 2 diabetes mellitus with other diabetic kidney complication: Secondary | ICD-10-CM | POA: Diagnosis not present

## 2016-06-27 DIAGNOSIS — D509 Iron deficiency anemia, unspecified: Secondary | ICD-10-CM | POA: Diagnosis not present

## 2016-06-27 DIAGNOSIS — E1129 Type 2 diabetes mellitus with other diabetic kidney complication: Secondary | ICD-10-CM | POA: Diagnosis not present

## 2016-06-27 DIAGNOSIS — N2581 Secondary hyperparathyroidism of renal origin: Secondary | ICD-10-CM | POA: Diagnosis not present

## 2016-06-27 DIAGNOSIS — N186 End stage renal disease: Secondary | ICD-10-CM | POA: Diagnosis not present

## 2016-06-27 DIAGNOSIS — D631 Anemia in chronic kidney disease: Secondary | ICD-10-CM | POA: Diagnosis not present

## 2016-06-30 DIAGNOSIS — E1129 Type 2 diabetes mellitus with other diabetic kidney complication: Secondary | ICD-10-CM | POA: Diagnosis not present

## 2016-06-30 DIAGNOSIS — D509 Iron deficiency anemia, unspecified: Secondary | ICD-10-CM | POA: Diagnosis not present

## 2016-06-30 DIAGNOSIS — N186 End stage renal disease: Secondary | ICD-10-CM | POA: Diagnosis not present

## 2016-06-30 DIAGNOSIS — D631 Anemia in chronic kidney disease: Secondary | ICD-10-CM | POA: Diagnosis not present

## 2016-06-30 DIAGNOSIS — N2581 Secondary hyperparathyroidism of renal origin: Secondary | ICD-10-CM | POA: Diagnosis not present

## 2016-07-02 DIAGNOSIS — E1129 Type 2 diabetes mellitus with other diabetic kidney complication: Secondary | ICD-10-CM | POA: Diagnosis not present

## 2016-07-02 DIAGNOSIS — D631 Anemia in chronic kidney disease: Secondary | ICD-10-CM | POA: Diagnosis not present

## 2016-07-02 DIAGNOSIS — D509 Iron deficiency anemia, unspecified: Secondary | ICD-10-CM | POA: Diagnosis not present

## 2016-07-02 DIAGNOSIS — N186 End stage renal disease: Secondary | ICD-10-CM | POA: Diagnosis not present

## 2016-07-02 DIAGNOSIS — N2581 Secondary hyperparathyroidism of renal origin: Secondary | ICD-10-CM | POA: Diagnosis not present

## 2016-07-04 DIAGNOSIS — N2581 Secondary hyperparathyroidism of renal origin: Secondary | ICD-10-CM | POA: Diagnosis not present

## 2016-07-04 DIAGNOSIS — D509 Iron deficiency anemia, unspecified: Secondary | ICD-10-CM | POA: Diagnosis not present

## 2016-07-04 DIAGNOSIS — E1129 Type 2 diabetes mellitus with other diabetic kidney complication: Secondary | ICD-10-CM | POA: Diagnosis not present

## 2016-07-04 DIAGNOSIS — D631 Anemia in chronic kidney disease: Secondary | ICD-10-CM | POA: Diagnosis not present

## 2016-07-04 DIAGNOSIS — N186 End stage renal disease: Secondary | ICD-10-CM | POA: Diagnosis not present

## 2016-07-07 DIAGNOSIS — D631 Anemia in chronic kidney disease: Secondary | ICD-10-CM | POA: Diagnosis not present

## 2016-07-07 DIAGNOSIS — N2581 Secondary hyperparathyroidism of renal origin: Secondary | ICD-10-CM | POA: Diagnosis not present

## 2016-07-07 DIAGNOSIS — E1129 Type 2 diabetes mellitus with other diabetic kidney complication: Secondary | ICD-10-CM | POA: Diagnosis not present

## 2016-07-07 DIAGNOSIS — D509 Iron deficiency anemia, unspecified: Secondary | ICD-10-CM | POA: Diagnosis not present

## 2016-07-07 DIAGNOSIS — N186 End stage renal disease: Secondary | ICD-10-CM | POA: Diagnosis not present

## 2016-07-09 DIAGNOSIS — E1129 Type 2 diabetes mellitus with other diabetic kidney complication: Secondary | ICD-10-CM | POA: Diagnosis not present

## 2016-07-09 DIAGNOSIS — N2581 Secondary hyperparathyroidism of renal origin: Secondary | ICD-10-CM | POA: Diagnosis not present

## 2016-07-09 DIAGNOSIS — N186 End stage renal disease: Secondary | ICD-10-CM | POA: Diagnosis not present

## 2016-07-09 DIAGNOSIS — D631 Anemia in chronic kidney disease: Secondary | ICD-10-CM | POA: Diagnosis not present

## 2016-07-09 DIAGNOSIS — D509 Iron deficiency anemia, unspecified: Secondary | ICD-10-CM | POA: Diagnosis not present

## 2016-07-11 DIAGNOSIS — N186 End stage renal disease: Secondary | ICD-10-CM | POA: Diagnosis not present

## 2016-07-11 DIAGNOSIS — D631 Anemia in chronic kidney disease: Secondary | ICD-10-CM | POA: Diagnosis not present

## 2016-07-11 DIAGNOSIS — E1129 Type 2 diabetes mellitus with other diabetic kidney complication: Secondary | ICD-10-CM | POA: Diagnosis not present

## 2016-07-11 DIAGNOSIS — D509 Iron deficiency anemia, unspecified: Secondary | ICD-10-CM | POA: Diagnosis not present

## 2016-07-11 DIAGNOSIS — N2581 Secondary hyperparathyroidism of renal origin: Secondary | ICD-10-CM | POA: Diagnosis not present

## 2016-07-14 DIAGNOSIS — D631 Anemia in chronic kidney disease: Secondary | ICD-10-CM | POA: Diagnosis not present

## 2016-07-14 DIAGNOSIS — N2581 Secondary hyperparathyroidism of renal origin: Secondary | ICD-10-CM | POA: Diagnosis not present

## 2016-07-14 DIAGNOSIS — D509 Iron deficiency anemia, unspecified: Secondary | ICD-10-CM | POA: Diagnosis not present

## 2016-07-14 DIAGNOSIS — N186 End stage renal disease: Secondary | ICD-10-CM | POA: Diagnosis not present

## 2016-07-14 DIAGNOSIS — E1129 Type 2 diabetes mellitus with other diabetic kidney complication: Secondary | ICD-10-CM | POA: Diagnosis not present

## 2016-07-16 DIAGNOSIS — Z992 Dependence on renal dialysis: Secondary | ICD-10-CM | POA: Diagnosis not present

## 2016-07-16 DIAGNOSIS — N186 End stage renal disease: Secondary | ICD-10-CM | POA: Diagnosis not present

## 2016-07-16 DIAGNOSIS — E1129 Type 2 diabetes mellitus with other diabetic kidney complication: Secondary | ICD-10-CM | POA: Diagnosis not present

## 2016-07-16 DIAGNOSIS — D509 Iron deficiency anemia, unspecified: Secondary | ICD-10-CM | POA: Diagnosis not present

## 2016-07-16 DIAGNOSIS — N2581 Secondary hyperparathyroidism of renal origin: Secondary | ICD-10-CM | POA: Diagnosis not present

## 2016-07-16 DIAGNOSIS — D631 Anemia in chronic kidney disease: Secondary | ICD-10-CM | POA: Diagnosis not present

## 2016-07-18 DIAGNOSIS — N186 End stage renal disease: Secondary | ICD-10-CM | POA: Diagnosis not present

## 2016-07-18 DIAGNOSIS — D509 Iron deficiency anemia, unspecified: Secondary | ICD-10-CM | POA: Diagnosis not present

## 2016-07-18 DIAGNOSIS — N2581 Secondary hyperparathyroidism of renal origin: Secondary | ICD-10-CM | POA: Diagnosis not present

## 2016-07-18 DIAGNOSIS — E1129 Type 2 diabetes mellitus with other diabetic kidney complication: Secondary | ICD-10-CM | POA: Diagnosis not present

## 2016-07-21 DIAGNOSIS — N186 End stage renal disease: Secondary | ICD-10-CM | POA: Diagnosis not present

## 2016-07-21 DIAGNOSIS — E1129 Type 2 diabetes mellitus with other diabetic kidney complication: Secondary | ICD-10-CM | POA: Diagnosis not present

## 2016-07-21 DIAGNOSIS — D509 Iron deficiency anemia, unspecified: Secondary | ICD-10-CM | POA: Diagnosis not present

## 2016-07-21 DIAGNOSIS — N2581 Secondary hyperparathyroidism of renal origin: Secondary | ICD-10-CM | POA: Diagnosis not present

## 2016-07-23 DIAGNOSIS — E1129 Type 2 diabetes mellitus with other diabetic kidney complication: Secondary | ICD-10-CM | POA: Diagnosis not present

## 2016-07-23 DIAGNOSIS — N186 End stage renal disease: Secondary | ICD-10-CM | POA: Diagnosis not present

## 2016-07-23 DIAGNOSIS — N2581 Secondary hyperparathyroidism of renal origin: Secondary | ICD-10-CM | POA: Diagnosis not present

## 2016-07-23 DIAGNOSIS — D509 Iron deficiency anemia, unspecified: Secondary | ICD-10-CM | POA: Diagnosis not present

## 2016-07-25 DIAGNOSIS — D509 Iron deficiency anemia, unspecified: Secondary | ICD-10-CM | POA: Diagnosis not present

## 2016-07-25 DIAGNOSIS — E1129 Type 2 diabetes mellitus with other diabetic kidney complication: Secondary | ICD-10-CM | POA: Diagnosis not present

## 2016-07-25 DIAGNOSIS — N2581 Secondary hyperparathyroidism of renal origin: Secondary | ICD-10-CM | POA: Diagnosis not present

## 2016-07-25 DIAGNOSIS — N186 End stage renal disease: Secondary | ICD-10-CM | POA: Diagnosis not present

## 2016-07-28 DIAGNOSIS — E1129 Type 2 diabetes mellitus with other diabetic kidney complication: Secondary | ICD-10-CM | POA: Diagnosis not present

## 2016-07-28 DIAGNOSIS — N2581 Secondary hyperparathyroidism of renal origin: Secondary | ICD-10-CM | POA: Diagnosis not present

## 2016-07-28 DIAGNOSIS — N186 End stage renal disease: Secondary | ICD-10-CM | POA: Diagnosis not present

## 2016-07-28 DIAGNOSIS — D509 Iron deficiency anemia, unspecified: Secondary | ICD-10-CM | POA: Diagnosis not present

## 2016-07-30 DIAGNOSIS — E1129 Type 2 diabetes mellitus with other diabetic kidney complication: Secondary | ICD-10-CM | POA: Diagnosis not present

## 2016-07-30 DIAGNOSIS — N2581 Secondary hyperparathyroidism of renal origin: Secondary | ICD-10-CM | POA: Diagnosis not present

## 2016-07-30 DIAGNOSIS — N186 End stage renal disease: Secondary | ICD-10-CM | POA: Diagnosis not present

## 2016-07-30 DIAGNOSIS — D509 Iron deficiency anemia, unspecified: Secondary | ICD-10-CM | POA: Diagnosis not present

## 2016-08-01 DIAGNOSIS — E1129 Type 2 diabetes mellitus with other diabetic kidney complication: Secondary | ICD-10-CM | POA: Diagnosis not present

## 2016-08-01 DIAGNOSIS — N2581 Secondary hyperparathyroidism of renal origin: Secondary | ICD-10-CM | POA: Diagnosis not present

## 2016-08-01 DIAGNOSIS — N186 End stage renal disease: Secondary | ICD-10-CM | POA: Diagnosis not present

## 2016-08-01 DIAGNOSIS — D509 Iron deficiency anemia, unspecified: Secondary | ICD-10-CM | POA: Diagnosis not present

## 2016-08-04 DIAGNOSIS — E1129 Type 2 diabetes mellitus with other diabetic kidney complication: Secondary | ICD-10-CM | POA: Diagnosis not present

## 2016-08-04 DIAGNOSIS — D509 Iron deficiency anemia, unspecified: Secondary | ICD-10-CM | POA: Diagnosis not present

## 2016-08-04 DIAGNOSIS — N186 End stage renal disease: Secondary | ICD-10-CM | POA: Diagnosis not present

## 2016-08-04 DIAGNOSIS — N2581 Secondary hyperparathyroidism of renal origin: Secondary | ICD-10-CM | POA: Diagnosis not present

## 2016-08-06 DIAGNOSIS — N186 End stage renal disease: Secondary | ICD-10-CM | POA: Diagnosis not present

## 2016-08-06 DIAGNOSIS — N2581 Secondary hyperparathyroidism of renal origin: Secondary | ICD-10-CM | POA: Diagnosis not present

## 2016-08-06 DIAGNOSIS — E1129 Type 2 diabetes mellitus with other diabetic kidney complication: Secondary | ICD-10-CM | POA: Diagnosis not present

## 2016-08-06 DIAGNOSIS — D509 Iron deficiency anemia, unspecified: Secondary | ICD-10-CM | POA: Diagnosis not present

## 2016-08-08 DIAGNOSIS — N186 End stage renal disease: Secondary | ICD-10-CM | POA: Diagnosis not present

## 2016-08-08 DIAGNOSIS — E1129 Type 2 diabetes mellitus with other diabetic kidney complication: Secondary | ICD-10-CM | POA: Diagnosis not present

## 2016-08-08 DIAGNOSIS — N2581 Secondary hyperparathyroidism of renal origin: Secondary | ICD-10-CM | POA: Diagnosis not present

## 2016-08-08 DIAGNOSIS — D509 Iron deficiency anemia, unspecified: Secondary | ICD-10-CM | POA: Diagnosis not present

## 2016-08-10 DIAGNOSIS — Z9181 History of falling: Secondary | ICD-10-CM | POA: Diagnosis not present

## 2016-08-10 DIAGNOSIS — Z1389 Encounter for screening for other disorder: Secondary | ICD-10-CM | POA: Diagnosis not present

## 2016-08-10 DIAGNOSIS — K219 Gastro-esophageal reflux disease without esophagitis: Secondary | ICD-10-CM | POA: Diagnosis not present

## 2016-08-10 DIAGNOSIS — E785 Hyperlipidemia, unspecified: Secondary | ICD-10-CM | POA: Diagnosis not present

## 2016-08-10 DIAGNOSIS — H3530 Unspecified macular degeneration: Secondary | ICD-10-CM | POA: Diagnosis not present

## 2016-08-10 DIAGNOSIS — I739 Peripheral vascular disease, unspecified: Secondary | ICD-10-CM | POA: Diagnosis not present

## 2016-08-10 DIAGNOSIS — J449 Chronic obstructive pulmonary disease, unspecified: Secondary | ICD-10-CM | POA: Diagnosis not present

## 2016-08-10 DIAGNOSIS — Z89511 Acquired absence of right leg below knee: Secondary | ICD-10-CM | POA: Diagnosis not present

## 2016-08-10 DIAGNOSIS — E1121 Type 2 diabetes mellitus with diabetic nephropathy: Secondary | ICD-10-CM | POA: Diagnosis not present

## 2016-08-10 DIAGNOSIS — G629 Polyneuropathy, unspecified: Secondary | ICD-10-CM | POA: Diagnosis not present

## 2016-08-10 DIAGNOSIS — N186 End stage renal disease: Secondary | ICD-10-CM | POA: Diagnosis not present

## 2016-08-10 DIAGNOSIS — I509 Heart failure, unspecified: Secondary | ICD-10-CM | POA: Diagnosis not present

## 2016-08-11 DIAGNOSIS — E1129 Type 2 diabetes mellitus with other diabetic kidney complication: Secondary | ICD-10-CM | POA: Diagnosis not present

## 2016-08-11 DIAGNOSIS — N186 End stage renal disease: Secondary | ICD-10-CM | POA: Diagnosis not present

## 2016-08-11 DIAGNOSIS — D509 Iron deficiency anemia, unspecified: Secondary | ICD-10-CM | POA: Diagnosis not present

## 2016-08-11 DIAGNOSIS — N2581 Secondary hyperparathyroidism of renal origin: Secondary | ICD-10-CM | POA: Diagnosis not present

## 2016-08-13 DIAGNOSIS — E1129 Type 2 diabetes mellitus with other diabetic kidney complication: Secondary | ICD-10-CM | POA: Diagnosis not present

## 2016-08-13 DIAGNOSIS — D509 Iron deficiency anemia, unspecified: Secondary | ICD-10-CM | POA: Diagnosis not present

## 2016-08-13 DIAGNOSIS — N2581 Secondary hyperparathyroidism of renal origin: Secondary | ICD-10-CM | POA: Diagnosis not present

## 2016-08-13 DIAGNOSIS — N186 End stage renal disease: Secondary | ICD-10-CM | POA: Diagnosis not present

## 2016-08-15 DIAGNOSIS — N186 End stage renal disease: Secondary | ICD-10-CM | POA: Diagnosis not present

## 2016-08-15 DIAGNOSIS — Z992 Dependence on renal dialysis: Secondary | ICD-10-CM | POA: Diagnosis not present

## 2016-08-15 DIAGNOSIS — E1129 Type 2 diabetes mellitus with other diabetic kidney complication: Secondary | ICD-10-CM | POA: Diagnosis not present

## 2016-08-15 DIAGNOSIS — N2581 Secondary hyperparathyroidism of renal origin: Secondary | ICD-10-CM | POA: Diagnosis not present

## 2016-08-15 DIAGNOSIS — D509 Iron deficiency anemia, unspecified: Secondary | ICD-10-CM | POA: Diagnosis not present

## 2016-08-18 DIAGNOSIS — N2581 Secondary hyperparathyroidism of renal origin: Secondary | ICD-10-CM | POA: Diagnosis not present

## 2016-08-18 DIAGNOSIS — Z23 Encounter for immunization: Secondary | ICD-10-CM | POA: Diagnosis not present

## 2016-08-18 DIAGNOSIS — N186 End stage renal disease: Secondary | ICD-10-CM | POA: Diagnosis not present

## 2016-08-20 DIAGNOSIS — N186 End stage renal disease: Secondary | ICD-10-CM | POA: Diagnosis not present

## 2016-08-20 DIAGNOSIS — Z23 Encounter for immunization: Secondary | ICD-10-CM | POA: Diagnosis not present

## 2016-08-20 DIAGNOSIS — N2581 Secondary hyperparathyroidism of renal origin: Secondary | ICD-10-CM | POA: Diagnosis not present

## 2016-08-22 DIAGNOSIS — N2581 Secondary hyperparathyroidism of renal origin: Secondary | ICD-10-CM | POA: Diagnosis not present

## 2016-08-22 DIAGNOSIS — N186 End stage renal disease: Secondary | ICD-10-CM | POA: Diagnosis not present

## 2016-08-22 DIAGNOSIS — Z23 Encounter for immunization: Secondary | ICD-10-CM | POA: Diagnosis not present

## 2016-08-25 DIAGNOSIS — Z23 Encounter for immunization: Secondary | ICD-10-CM | POA: Diagnosis not present

## 2016-08-25 DIAGNOSIS — N186 End stage renal disease: Secondary | ICD-10-CM | POA: Diagnosis not present

## 2016-08-25 DIAGNOSIS — N2581 Secondary hyperparathyroidism of renal origin: Secondary | ICD-10-CM | POA: Diagnosis not present

## 2016-08-27 DIAGNOSIS — Z23 Encounter for immunization: Secondary | ICD-10-CM | POA: Diagnosis not present

## 2016-08-27 DIAGNOSIS — N186 End stage renal disease: Secondary | ICD-10-CM | POA: Diagnosis not present

## 2016-08-27 DIAGNOSIS — N2581 Secondary hyperparathyroidism of renal origin: Secondary | ICD-10-CM | POA: Diagnosis not present

## 2016-08-29 DIAGNOSIS — Z23 Encounter for immunization: Secondary | ICD-10-CM | POA: Diagnosis not present

## 2016-08-29 DIAGNOSIS — N2581 Secondary hyperparathyroidism of renal origin: Secondary | ICD-10-CM | POA: Diagnosis not present

## 2016-08-29 DIAGNOSIS — N186 End stage renal disease: Secondary | ICD-10-CM | POA: Diagnosis not present

## 2016-09-01 DIAGNOSIS — N2581 Secondary hyperparathyroidism of renal origin: Secondary | ICD-10-CM | POA: Diagnosis not present

## 2016-09-01 DIAGNOSIS — Z23 Encounter for immunization: Secondary | ICD-10-CM | POA: Diagnosis not present

## 2016-09-01 DIAGNOSIS — N186 End stage renal disease: Secondary | ICD-10-CM | POA: Diagnosis not present

## 2016-09-03 DIAGNOSIS — Z23 Encounter for immunization: Secondary | ICD-10-CM | POA: Diagnosis not present

## 2016-09-03 DIAGNOSIS — N186 End stage renal disease: Secondary | ICD-10-CM | POA: Diagnosis not present

## 2016-09-03 DIAGNOSIS — N2581 Secondary hyperparathyroidism of renal origin: Secondary | ICD-10-CM | POA: Diagnosis not present

## 2016-09-05 DIAGNOSIS — N2581 Secondary hyperparathyroidism of renal origin: Secondary | ICD-10-CM | POA: Diagnosis not present

## 2016-09-05 DIAGNOSIS — Z23 Encounter for immunization: Secondary | ICD-10-CM | POA: Diagnosis not present

## 2016-09-05 DIAGNOSIS — N186 End stage renal disease: Secondary | ICD-10-CM | POA: Diagnosis not present

## 2016-09-08 DIAGNOSIS — Z23 Encounter for immunization: Secondary | ICD-10-CM | POA: Diagnosis not present

## 2016-09-08 DIAGNOSIS — N2581 Secondary hyperparathyroidism of renal origin: Secondary | ICD-10-CM | POA: Diagnosis not present

## 2016-09-08 DIAGNOSIS — N186 End stage renal disease: Secondary | ICD-10-CM | POA: Diagnosis not present

## 2016-09-10 DIAGNOSIS — N186 End stage renal disease: Secondary | ICD-10-CM | POA: Diagnosis not present

## 2016-09-10 DIAGNOSIS — E1129 Type 2 diabetes mellitus with other diabetic kidney complication: Secondary | ICD-10-CM | POA: Diagnosis not present

## 2016-09-10 DIAGNOSIS — Z23 Encounter for immunization: Secondary | ICD-10-CM | POA: Diagnosis not present

## 2016-09-10 DIAGNOSIS — N2581 Secondary hyperparathyroidism of renal origin: Secondary | ICD-10-CM | POA: Diagnosis not present

## 2016-09-12 DIAGNOSIS — Z23 Encounter for immunization: Secondary | ICD-10-CM | POA: Diagnosis not present

## 2016-09-12 DIAGNOSIS — N186 End stage renal disease: Secondary | ICD-10-CM | POA: Diagnosis not present

## 2016-09-12 DIAGNOSIS — N2581 Secondary hyperparathyroidism of renal origin: Secondary | ICD-10-CM | POA: Diagnosis not present

## 2016-09-15 DIAGNOSIS — N186 End stage renal disease: Secondary | ICD-10-CM | POA: Diagnosis not present

## 2016-09-15 DIAGNOSIS — Z992 Dependence on renal dialysis: Secondary | ICD-10-CM | POA: Diagnosis not present

## 2016-09-15 DIAGNOSIS — E1129 Type 2 diabetes mellitus with other diabetic kidney complication: Secondary | ICD-10-CM | POA: Diagnosis not present

## 2016-09-15 DIAGNOSIS — Z23 Encounter for immunization: Secondary | ICD-10-CM | POA: Diagnosis not present

## 2016-09-15 DIAGNOSIS — N2581 Secondary hyperparathyroidism of renal origin: Secondary | ICD-10-CM | POA: Diagnosis not present

## 2016-09-17 DIAGNOSIS — N186 End stage renal disease: Secondary | ICD-10-CM | POA: Diagnosis not present

## 2016-09-17 DIAGNOSIS — E1129 Type 2 diabetes mellitus with other diabetic kidney complication: Secondary | ICD-10-CM | POA: Diagnosis not present

## 2016-09-17 DIAGNOSIS — N2581 Secondary hyperparathyroidism of renal origin: Secondary | ICD-10-CM | POA: Diagnosis not present

## 2016-09-19 DIAGNOSIS — N186 End stage renal disease: Secondary | ICD-10-CM | POA: Diagnosis not present

## 2016-09-19 DIAGNOSIS — E1129 Type 2 diabetes mellitus with other diabetic kidney complication: Secondary | ICD-10-CM | POA: Diagnosis not present

## 2016-09-19 DIAGNOSIS — N2581 Secondary hyperparathyroidism of renal origin: Secondary | ICD-10-CM | POA: Diagnosis not present

## 2016-09-22 DIAGNOSIS — N186 End stage renal disease: Secondary | ICD-10-CM | POA: Diagnosis not present

## 2016-09-22 DIAGNOSIS — N2581 Secondary hyperparathyroidism of renal origin: Secondary | ICD-10-CM | POA: Diagnosis not present

## 2016-09-22 DIAGNOSIS — E1129 Type 2 diabetes mellitus with other diabetic kidney complication: Secondary | ICD-10-CM | POA: Diagnosis not present

## 2016-09-24 DIAGNOSIS — N186 End stage renal disease: Secondary | ICD-10-CM | POA: Diagnosis not present

## 2016-09-24 DIAGNOSIS — E1129 Type 2 diabetes mellitus with other diabetic kidney complication: Secondary | ICD-10-CM | POA: Diagnosis not present

## 2016-09-24 DIAGNOSIS — N2581 Secondary hyperparathyroidism of renal origin: Secondary | ICD-10-CM | POA: Diagnosis not present

## 2016-09-26 DIAGNOSIS — N186 End stage renal disease: Secondary | ICD-10-CM | POA: Diagnosis not present

## 2016-09-26 DIAGNOSIS — E1129 Type 2 diabetes mellitus with other diabetic kidney complication: Secondary | ICD-10-CM | POA: Diagnosis not present

## 2016-09-26 DIAGNOSIS — N2581 Secondary hyperparathyroidism of renal origin: Secondary | ICD-10-CM | POA: Diagnosis not present

## 2016-09-29 DIAGNOSIS — N186 End stage renal disease: Secondary | ICD-10-CM | POA: Diagnosis not present

## 2016-09-29 DIAGNOSIS — E1129 Type 2 diabetes mellitus with other diabetic kidney complication: Secondary | ICD-10-CM | POA: Diagnosis not present

## 2016-09-29 DIAGNOSIS — N2581 Secondary hyperparathyroidism of renal origin: Secondary | ICD-10-CM | POA: Diagnosis not present

## 2016-10-01 DIAGNOSIS — N2581 Secondary hyperparathyroidism of renal origin: Secondary | ICD-10-CM | POA: Diagnosis not present

## 2016-10-01 DIAGNOSIS — E1129 Type 2 diabetes mellitus with other diabetic kidney complication: Secondary | ICD-10-CM | POA: Diagnosis not present

## 2016-10-01 DIAGNOSIS — N186 End stage renal disease: Secondary | ICD-10-CM | POA: Diagnosis not present

## 2016-10-03 DIAGNOSIS — E1129 Type 2 diabetes mellitus with other diabetic kidney complication: Secondary | ICD-10-CM | POA: Diagnosis not present

## 2016-10-03 DIAGNOSIS — N186 End stage renal disease: Secondary | ICD-10-CM | POA: Diagnosis not present

## 2016-10-03 DIAGNOSIS — N2581 Secondary hyperparathyroidism of renal origin: Secondary | ICD-10-CM | POA: Diagnosis not present

## 2016-10-06 DIAGNOSIS — N2581 Secondary hyperparathyroidism of renal origin: Secondary | ICD-10-CM | POA: Diagnosis not present

## 2016-10-06 DIAGNOSIS — E1129 Type 2 diabetes mellitus with other diabetic kidney complication: Secondary | ICD-10-CM | POA: Diagnosis not present

## 2016-10-06 DIAGNOSIS — N186 End stage renal disease: Secondary | ICD-10-CM | POA: Diagnosis not present

## 2016-10-08 DIAGNOSIS — N186 End stage renal disease: Secondary | ICD-10-CM | POA: Diagnosis not present

## 2016-10-08 DIAGNOSIS — N2581 Secondary hyperparathyroidism of renal origin: Secondary | ICD-10-CM | POA: Diagnosis not present

## 2016-10-08 DIAGNOSIS — E1129 Type 2 diabetes mellitus with other diabetic kidney complication: Secondary | ICD-10-CM | POA: Diagnosis not present

## 2016-10-10 DIAGNOSIS — E1129 Type 2 diabetes mellitus with other diabetic kidney complication: Secondary | ICD-10-CM | POA: Diagnosis not present

## 2016-10-10 DIAGNOSIS — N186 End stage renal disease: Secondary | ICD-10-CM | POA: Diagnosis not present

## 2016-10-10 DIAGNOSIS — N2581 Secondary hyperparathyroidism of renal origin: Secondary | ICD-10-CM | POA: Diagnosis not present

## 2016-10-13 DIAGNOSIS — N2581 Secondary hyperparathyroidism of renal origin: Secondary | ICD-10-CM | POA: Diagnosis not present

## 2016-10-13 DIAGNOSIS — N186 End stage renal disease: Secondary | ICD-10-CM | POA: Diagnosis not present

## 2016-10-13 DIAGNOSIS — E1129 Type 2 diabetes mellitus with other diabetic kidney complication: Secondary | ICD-10-CM | POA: Diagnosis not present

## 2016-10-15 DIAGNOSIS — Z992 Dependence on renal dialysis: Secondary | ICD-10-CM | POA: Diagnosis not present

## 2016-10-15 DIAGNOSIS — E1129 Type 2 diabetes mellitus with other diabetic kidney complication: Secondary | ICD-10-CM | POA: Diagnosis not present

## 2016-10-15 DIAGNOSIS — N2581 Secondary hyperparathyroidism of renal origin: Secondary | ICD-10-CM | POA: Diagnosis not present

## 2016-10-15 DIAGNOSIS — N186 End stage renal disease: Secondary | ICD-10-CM | POA: Diagnosis not present

## 2016-10-17 DIAGNOSIS — D631 Anemia in chronic kidney disease: Secondary | ICD-10-CM | POA: Diagnosis not present

## 2016-10-17 DIAGNOSIS — N186 End stage renal disease: Secondary | ICD-10-CM | POA: Diagnosis not present

## 2016-10-17 DIAGNOSIS — D509 Iron deficiency anemia, unspecified: Secondary | ICD-10-CM | POA: Diagnosis not present

## 2016-10-17 DIAGNOSIS — E1129 Type 2 diabetes mellitus with other diabetic kidney complication: Secondary | ICD-10-CM | POA: Diagnosis not present

## 2016-10-17 DIAGNOSIS — N2581 Secondary hyperparathyroidism of renal origin: Secondary | ICD-10-CM | POA: Diagnosis not present

## 2016-10-20 DIAGNOSIS — D509 Iron deficiency anemia, unspecified: Secondary | ICD-10-CM | POA: Diagnosis not present

## 2016-10-20 DIAGNOSIS — D631 Anemia in chronic kidney disease: Secondary | ICD-10-CM | POA: Diagnosis not present

## 2016-10-20 DIAGNOSIS — E1129 Type 2 diabetes mellitus with other diabetic kidney complication: Secondary | ICD-10-CM | POA: Diagnosis not present

## 2016-10-20 DIAGNOSIS — N2581 Secondary hyperparathyroidism of renal origin: Secondary | ICD-10-CM | POA: Diagnosis not present

## 2016-10-20 DIAGNOSIS — N186 End stage renal disease: Secondary | ICD-10-CM | POA: Diagnosis not present

## 2016-10-22 DIAGNOSIS — N2581 Secondary hyperparathyroidism of renal origin: Secondary | ICD-10-CM | POA: Diagnosis not present

## 2016-10-22 DIAGNOSIS — D509 Iron deficiency anemia, unspecified: Secondary | ICD-10-CM | POA: Diagnosis not present

## 2016-10-22 DIAGNOSIS — N186 End stage renal disease: Secondary | ICD-10-CM | POA: Diagnosis not present

## 2016-10-22 DIAGNOSIS — E1129 Type 2 diabetes mellitus with other diabetic kidney complication: Secondary | ICD-10-CM | POA: Diagnosis not present

## 2016-10-22 DIAGNOSIS — D631 Anemia in chronic kidney disease: Secondary | ICD-10-CM | POA: Diagnosis not present

## 2016-10-24 DIAGNOSIS — D509 Iron deficiency anemia, unspecified: Secondary | ICD-10-CM | POA: Diagnosis not present

## 2016-10-24 DIAGNOSIS — D631 Anemia in chronic kidney disease: Secondary | ICD-10-CM | POA: Diagnosis not present

## 2016-10-24 DIAGNOSIS — E1129 Type 2 diabetes mellitus with other diabetic kidney complication: Secondary | ICD-10-CM | POA: Diagnosis not present

## 2016-10-24 DIAGNOSIS — N186 End stage renal disease: Secondary | ICD-10-CM | POA: Diagnosis not present

## 2016-10-24 DIAGNOSIS — N2581 Secondary hyperparathyroidism of renal origin: Secondary | ICD-10-CM | POA: Diagnosis not present

## 2016-10-27 DIAGNOSIS — E1129 Type 2 diabetes mellitus with other diabetic kidney complication: Secondary | ICD-10-CM | POA: Diagnosis not present

## 2016-10-27 DIAGNOSIS — N186 End stage renal disease: Secondary | ICD-10-CM | POA: Diagnosis not present

## 2016-10-27 DIAGNOSIS — D509 Iron deficiency anemia, unspecified: Secondary | ICD-10-CM | POA: Diagnosis not present

## 2016-10-27 DIAGNOSIS — D631 Anemia in chronic kidney disease: Secondary | ICD-10-CM | POA: Diagnosis not present

## 2016-10-27 DIAGNOSIS — N2581 Secondary hyperparathyroidism of renal origin: Secondary | ICD-10-CM | POA: Diagnosis not present

## 2016-10-29 DIAGNOSIS — D509 Iron deficiency anemia, unspecified: Secondary | ICD-10-CM | POA: Diagnosis not present

## 2016-10-29 DIAGNOSIS — N186 End stage renal disease: Secondary | ICD-10-CM | POA: Diagnosis not present

## 2016-10-29 DIAGNOSIS — E1129 Type 2 diabetes mellitus with other diabetic kidney complication: Secondary | ICD-10-CM | POA: Diagnosis not present

## 2016-10-29 DIAGNOSIS — D631 Anemia in chronic kidney disease: Secondary | ICD-10-CM | POA: Diagnosis not present

## 2016-10-29 DIAGNOSIS — N2581 Secondary hyperparathyroidism of renal origin: Secondary | ICD-10-CM | POA: Diagnosis not present

## 2016-10-31 DIAGNOSIS — N2581 Secondary hyperparathyroidism of renal origin: Secondary | ICD-10-CM | POA: Diagnosis not present

## 2016-10-31 DIAGNOSIS — N186 End stage renal disease: Secondary | ICD-10-CM | POA: Diagnosis not present

## 2016-10-31 DIAGNOSIS — E1129 Type 2 diabetes mellitus with other diabetic kidney complication: Secondary | ICD-10-CM | POA: Diagnosis not present

## 2016-10-31 DIAGNOSIS — D509 Iron deficiency anemia, unspecified: Secondary | ICD-10-CM | POA: Diagnosis not present

## 2016-10-31 DIAGNOSIS — D631 Anemia in chronic kidney disease: Secondary | ICD-10-CM | POA: Diagnosis not present

## 2016-11-03 DIAGNOSIS — N2581 Secondary hyperparathyroidism of renal origin: Secondary | ICD-10-CM | POA: Diagnosis not present

## 2016-11-03 DIAGNOSIS — D631 Anemia in chronic kidney disease: Secondary | ICD-10-CM | POA: Diagnosis not present

## 2016-11-03 DIAGNOSIS — N186 End stage renal disease: Secondary | ICD-10-CM | POA: Diagnosis not present

## 2016-11-03 DIAGNOSIS — E1129 Type 2 diabetes mellitus with other diabetic kidney complication: Secondary | ICD-10-CM | POA: Diagnosis not present

## 2016-11-03 DIAGNOSIS — D509 Iron deficiency anemia, unspecified: Secondary | ICD-10-CM | POA: Diagnosis not present

## 2016-11-05 DIAGNOSIS — E1129 Type 2 diabetes mellitus with other diabetic kidney complication: Secondary | ICD-10-CM | POA: Diagnosis not present

## 2016-11-05 DIAGNOSIS — N186 End stage renal disease: Secondary | ICD-10-CM | POA: Diagnosis not present

## 2016-11-05 DIAGNOSIS — D509 Iron deficiency anemia, unspecified: Secondary | ICD-10-CM | POA: Diagnosis not present

## 2016-11-05 DIAGNOSIS — D631 Anemia in chronic kidney disease: Secondary | ICD-10-CM | POA: Diagnosis not present

## 2016-11-05 DIAGNOSIS — N2581 Secondary hyperparathyroidism of renal origin: Secondary | ICD-10-CM | POA: Diagnosis not present

## 2016-11-07 DIAGNOSIS — D631 Anemia in chronic kidney disease: Secondary | ICD-10-CM | POA: Diagnosis not present

## 2016-11-07 DIAGNOSIS — N186 End stage renal disease: Secondary | ICD-10-CM | POA: Diagnosis not present

## 2016-11-07 DIAGNOSIS — D509 Iron deficiency anemia, unspecified: Secondary | ICD-10-CM | POA: Diagnosis not present

## 2016-11-07 DIAGNOSIS — N2581 Secondary hyperparathyroidism of renal origin: Secondary | ICD-10-CM | POA: Diagnosis not present

## 2016-11-07 DIAGNOSIS — E1129 Type 2 diabetes mellitus with other diabetic kidney complication: Secondary | ICD-10-CM | POA: Diagnosis not present

## 2016-11-10 DIAGNOSIS — D509 Iron deficiency anemia, unspecified: Secondary | ICD-10-CM | POA: Diagnosis not present

## 2016-11-10 DIAGNOSIS — N186 End stage renal disease: Secondary | ICD-10-CM | POA: Diagnosis not present

## 2016-11-10 DIAGNOSIS — E1129 Type 2 diabetes mellitus with other diabetic kidney complication: Secondary | ICD-10-CM | POA: Diagnosis not present

## 2016-11-10 DIAGNOSIS — D631 Anemia in chronic kidney disease: Secondary | ICD-10-CM | POA: Diagnosis not present

## 2016-11-10 DIAGNOSIS — N2581 Secondary hyperparathyroidism of renal origin: Secondary | ICD-10-CM | POA: Diagnosis not present

## 2016-11-12 DIAGNOSIS — N2581 Secondary hyperparathyroidism of renal origin: Secondary | ICD-10-CM | POA: Diagnosis not present

## 2016-11-12 DIAGNOSIS — N186 End stage renal disease: Secondary | ICD-10-CM | POA: Diagnosis not present

## 2016-11-12 DIAGNOSIS — D631 Anemia in chronic kidney disease: Secondary | ICD-10-CM | POA: Diagnosis not present

## 2016-11-12 DIAGNOSIS — D509 Iron deficiency anemia, unspecified: Secondary | ICD-10-CM | POA: Diagnosis not present

## 2016-11-12 DIAGNOSIS — E1129 Type 2 diabetes mellitus with other diabetic kidney complication: Secondary | ICD-10-CM | POA: Diagnosis not present

## 2016-11-14 DIAGNOSIS — E1129 Type 2 diabetes mellitus with other diabetic kidney complication: Secondary | ICD-10-CM | POA: Diagnosis not present

## 2016-11-14 DIAGNOSIS — D631 Anemia in chronic kidney disease: Secondary | ICD-10-CM | POA: Diagnosis not present

## 2016-11-14 DIAGNOSIS — N186 End stage renal disease: Secondary | ICD-10-CM | POA: Diagnosis not present

## 2016-11-14 DIAGNOSIS — N2581 Secondary hyperparathyroidism of renal origin: Secondary | ICD-10-CM | POA: Diagnosis not present

## 2016-11-14 DIAGNOSIS — D509 Iron deficiency anemia, unspecified: Secondary | ICD-10-CM | POA: Diagnosis not present

## 2016-11-15 DIAGNOSIS — Z992 Dependence on renal dialysis: Secondary | ICD-10-CM | POA: Diagnosis not present

## 2016-11-15 DIAGNOSIS — E1129 Type 2 diabetes mellitus with other diabetic kidney complication: Secondary | ICD-10-CM | POA: Diagnosis not present

## 2016-11-15 DIAGNOSIS — N186 End stage renal disease: Secondary | ICD-10-CM | POA: Diagnosis not present

## 2016-11-17 DIAGNOSIS — D631 Anemia in chronic kidney disease: Secondary | ICD-10-CM | POA: Diagnosis not present

## 2016-11-17 DIAGNOSIS — N2581 Secondary hyperparathyroidism of renal origin: Secondary | ICD-10-CM | POA: Diagnosis not present

## 2016-11-17 DIAGNOSIS — E162 Hypoglycemia, unspecified: Secondary | ICD-10-CM | POA: Diagnosis not present

## 2016-11-17 DIAGNOSIS — N186 End stage renal disease: Secondary | ICD-10-CM | POA: Diagnosis not present

## 2016-11-17 DIAGNOSIS — D509 Iron deficiency anemia, unspecified: Secondary | ICD-10-CM | POA: Diagnosis not present

## 2016-11-19 DIAGNOSIS — E162 Hypoglycemia, unspecified: Secondary | ICD-10-CM | POA: Diagnosis not present

## 2016-11-19 DIAGNOSIS — N186 End stage renal disease: Secondary | ICD-10-CM | POA: Diagnosis not present

## 2016-11-19 DIAGNOSIS — D509 Iron deficiency anemia, unspecified: Secondary | ICD-10-CM | POA: Diagnosis not present

## 2016-11-19 DIAGNOSIS — N2581 Secondary hyperparathyroidism of renal origin: Secondary | ICD-10-CM | POA: Diagnosis not present

## 2016-11-19 DIAGNOSIS — D631 Anemia in chronic kidney disease: Secondary | ICD-10-CM | POA: Diagnosis not present

## 2016-11-21 DIAGNOSIS — E162 Hypoglycemia, unspecified: Secondary | ICD-10-CM | POA: Diagnosis not present

## 2016-11-21 DIAGNOSIS — D509 Iron deficiency anemia, unspecified: Secondary | ICD-10-CM | POA: Diagnosis not present

## 2016-11-21 DIAGNOSIS — N2581 Secondary hyperparathyroidism of renal origin: Secondary | ICD-10-CM | POA: Diagnosis not present

## 2016-11-21 DIAGNOSIS — D631 Anemia in chronic kidney disease: Secondary | ICD-10-CM | POA: Diagnosis not present

## 2016-11-21 DIAGNOSIS — N186 End stage renal disease: Secondary | ICD-10-CM | POA: Diagnosis not present

## 2016-11-24 DIAGNOSIS — N2581 Secondary hyperparathyroidism of renal origin: Secondary | ICD-10-CM | POA: Diagnosis not present

## 2016-11-24 DIAGNOSIS — E162 Hypoglycemia, unspecified: Secondary | ICD-10-CM | POA: Diagnosis not present

## 2016-11-24 DIAGNOSIS — N186 End stage renal disease: Secondary | ICD-10-CM | POA: Diagnosis not present

## 2016-11-24 DIAGNOSIS — D631 Anemia in chronic kidney disease: Secondary | ICD-10-CM | POA: Diagnosis not present

## 2016-11-24 DIAGNOSIS — D509 Iron deficiency anemia, unspecified: Secondary | ICD-10-CM | POA: Diagnosis not present

## 2016-11-26 DIAGNOSIS — N186 End stage renal disease: Secondary | ICD-10-CM | POA: Diagnosis not present

## 2016-11-26 DIAGNOSIS — D631 Anemia in chronic kidney disease: Secondary | ICD-10-CM | POA: Diagnosis not present

## 2016-11-26 DIAGNOSIS — E162 Hypoglycemia, unspecified: Secondary | ICD-10-CM | POA: Diagnosis not present

## 2016-11-26 DIAGNOSIS — N2581 Secondary hyperparathyroidism of renal origin: Secondary | ICD-10-CM | POA: Diagnosis not present

## 2016-11-26 DIAGNOSIS — D509 Iron deficiency anemia, unspecified: Secondary | ICD-10-CM | POA: Diagnosis not present

## 2016-11-28 DIAGNOSIS — D509 Iron deficiency anemia, unspecified: Secondary | ICD-10-CM | POA: Diagnosis not present

## 2016-11-28 DIAGNOSIS — E162 Hypoglycemia, unspecified: Secondary | ICD-10-CM | POA: Diagnosis not present

## 2016-11-28 DIAGNOSIS — D631 Anemia in chronic kidney disease: Secondary | ICD-10-CM | POA: Diagnosis not present

## 2016-11-28 DIAGNOSIS — N2581 Secondary hyperparathyroidism of renal origin: Secondary | ICD-10-CM | POA: Diagnosis not present

## 2016-11-28 DIAGNOSIS — N186 End stage renal disease: Secondary | ICD-10-CM | POA: Diagnosis not present

## 2016-12-01 DIAGNOSIS — D509 Iron deficiency anemia, unspecified: Secondary | ICD-10-CM | POA: Diagnosis not present

## 2016-12-01 DIAGNOSIS — N186 End stage renal disease: Secondary | ICD-10-CM | POA: Diagnosis not present

## 2016-12-01 DIAGNOSIS — E162 Hypoglycemia, unspecified: Secondary | ICD-10-CM | POA: Diagnosis not present

## 2016-12-01 DIAGNOSIS — D631 Anemia in chronic kidney disease: Secondary | ICD-10-CM | POA: Diagnosis not present

## 2016-12-01 DIAGNOSIS — N2581 Secondary hyperparathyroidism of renal origin: Secondary | ICD-10-CM | POA: Diagnosis not present

## 2016-12-05 DIAGNOSIS — E162 Hypoglycemia, unspecified: Secondary | ICD-10-CM | POA: Diagnosis not present

## 2016-12-05 DIAGNOSIS — N186 End stage renal disease: Secondary | ICD-10-CM | POA: Diagnosis not present

## 2016-12-05 DIAGNOSIS — D631 Anemia in chronic kidney disease: Secondary | ICD-10-CM | POA: Diagnosis not present

## 2016-12-05 DIAGNOSIS — D509 Iron deficiency anemia, unspecified: Secondary | ICD-10-CM | POA: Diagnosis not present

## 2016-12-05 DIAGNOSIS — N2581 Secondary hyperparathyroidism of renal origin: Secondary | ICD-10-CM | POA: Diagnosis not present

## 2016-12-10 DIAGNOSIS — E1129 Type 2 diabetes mellitus with other diabetic kidney complication: Secondary | ICD-10-CM | POA: Diagnosis not present

## 2016-12-10 DIAGNOSIS — N186 End stage renal disease: Secondary | ICD-10-CM | POA: Diagnosis not present

## 2016-12-10 DIAGNOSIS — D509 Iron deficiency anemia, unspecified: Secondary | ICD-10-CM | POA: Diagnosis not present

## 2016-12-10 DIAGNOSIS — D631 Anemia in chronic kidney disease: Secondary | ICD-10-CM | POA: Diagnosis not present

## 2016-12-10 DIAGNOSIS — E162 Hypoglycemia, unspecified: Secondary | ICD-10-CM | POA: Diagnosis not present

## 2016-12-10 DIAGNOSIS — N2581 Secondary hyperparathyroidism of renal origin: Secondary | ICD-10-CM | POA: Diagnosis not present

## 2016-12-11 DIAGNOSIS — Z9181 History of falling: Secondary | ICD-10-CM | POA: Diagnosis not present

## 2016-12-11 DIAGNOSIS — E1121 Type 2 diabetes mellitus with diabetic nephropathy: Secondary | ICD-10-CM | POA: Diagnosis not present

## 2016-12-11 DIAGNOSIS — Z0000 Encounter for general adult medical examination without abnormal findings: Secondary | ICD-10-CM | POA: Diagnosis not present

## 2016-12-11 DIAGNOSIS — Z136 Encounter for screening for cardiovascular disorders: Secondary | ICD-10-CM | POA: Diagnosis not present

## 2016-12-11 DIAGNOSIS — Z125 Encounter for screening for malignant neoplasm of prostate: Secondary | ICD-10-CM | POA: Diagnosis not present

## 2016-12-11 DIAGNOSIS — I10 Essential (primary) hypertension: Secondary | ICD-10-CM | POA: Diagnosis not present

## 2016-12-11 DIAGNOSIS — Z1389 Encounter for screening for other disorder: Secondary | ICD-10-CM | POA: Diagnosis not present

## 2016-12-12 DIAGNOSIS — N2581 Secondary hyperparathyroidism of renal origin: Secondary | ICD-10-CM | POA: Diagnosis not present

## 2016-12-12 DIAGNOSIS — N186 End stage renal disease: Secondary | ICD-10-CM | POA: Diagnosis not present

## 2016-12-12 DIAGNOSIS — E162 Hypoglycemia, unspecified: Secondary | ICD-10-CM | POA: Diagnosis not present

## 2016-12-12 DIAGNOSIS — D631 Anemia in chronic kidney disease: Secondary | ICD-10-CM | POA: Diagnosis not present

## 2016-12-12 DIAGNOSIS — D509 Iron deficiency anemia, unspecified: Secondary | ICD-10-CM | POA: Diagnosis not present

## 2016-12-15 DIAGNOSIS — E162 Hypoglycemia, unspecified: Secondary | ICD-10-CM | POA: Diagnosis not present

## 2016-12-15 DIAGNOSIS — D509 Iron deficiency anemia, unspecified: Secondary | ICD-10-CM | POA: Diagnosis not present

## 2016-12-15 DIAGNOSIS — D631 Anemia in chronic kidney disease: Secondary | ICD-10-CM | POA: Diagnosis not present

## 2016-12-15 DIAGNOSIS — N2581 Secondary hyperparathyroidism of renal origin: Secondary | ICD-10-CM | POA: Diagnosis not present

## 2016-12-15 DIAGNOSIS — N186 End stage renal disease: Secondary | ICD-10-CM | POA: Diagnosis not present

## 2016-12-16 DIAGNOSIS — Z992 Dependence on renal dialysis: Secondary | ICD-10-CM | POA: Diagnosis not present

## 2016-12-16 DIAGNOSIS — E1129 Type 2 diabetes mellitus with other diabetic kidney complication: Secondary | ICD-10-CM | POA: Diagnosis not present

## 2016-12-16 DIAGNOSIS — N186 End stage renal disease: Secondary | ICD-10-CM | POA: Diagnosis not present

## 2016-12-17 DIAGNOSIS — E785 Hyperlipidemia, unspecified: Secondary | ICD-10-CM | POA: Diagnosis not present

## 2016-12-17 DIAGNOSIS — E1129 Type 2 diabetes mellitus with other diabetic kidney complication: Secondary | ICD-10-CM | POA: Diagnosis not present

## 2016-12-17 DIAGNOSIS — Z6823 Body mass index (BMI) 23.0-23.9, adult: Secondary | ICD-10-CM | POA: Diagnosis not present

## 2016-12-17 DIAGNOSIS — N186 End stage renal disease: Secondary | ICD-10-CM | POA: Diagnosis not present

## 2016-12-17 DIAGNOSIS — N2581 Secondary hyperparathyroidism of renal origin: Secondary | ICD-10-CM | POA: Diagnosis not present

## 2016-12-17 DIAGNOSIS — K219 Gastro-esophageal reflux disease without esophagitis: Secondary | ICD-10-CM | POA: Diagnosis not present

## 2016-12-17 DIAGNOSIS — D509 Iron deficiency anemia, unspecified: Secondary | ICD-10-CM | POA: Diagnosis not present

## 2016-12-17 DIAGNOSIS — D631 Anemia in chronic kidney disease: Secondary | ICD-10-CM | POA: Diagnosis not present

## 2016-12-17 DIAGNOSIS — I10 Essential (primary) hypertension: Secondary | ICD-10-CM | POA: Diagnosis not present

## 2016-12-17 DIAGNOSIS — H3530 Unspecified macular degeneration: Secondary | ICD-10-CM | POA: Diagnosis not present

## 2016-12-17 DIAGNOSIS — I509 Heart failure, unspecified: Secondary | ICD-10-CM | POA: Diagnosis not present

## 2016-12-17 DIAGNOSIS — I739 Peripheral vascular disease, unspecified: Secondary | ICD-10-CM | POA: Diagnosis not present

## 2016-12-17 DIAGNOSIS — J449 Chronic obstructive pulmonary disease, unspecified: Secondary | ICD-10-CM | POA: Diagnosis not present

## 2016-12-17 DIAGNOSIS — G629 Polyneuropathy, unspecified: Secondary | ICD-10-CM | POA: Diagnosis not present

## 2016-12-17 DIAGNOSIS — Z89511 Acquired absence of right leg below knee: Secondary | ICD-10-CM | POA: Diagnosis not present

## 2016-12-17 DIAGNOSIS — E1121 Type 2 diabetes mellitus with diabetic nephropathy: Secondary | ICD-10-CM | POA: Diagnosis not present

## 2016-12-19 DIAGNOSIS — D631 Anemia in chronic kidney disease: Secondary | ICD-10-CM | POA: Diagnosis not present

## 2016-12-19 DIAGNOSIS — D509 Iron deficiency anemia, unspecified: Secondary | ICD-10-CM | POA: Diagnosis not present

## 2016-12-19 DIAGNOSIS — N186 End stage renal disease: Secondary | ICD-10-CM | POA: Diagnosis not present

## 2016-12-19 DIAGNOSIS — N2581 Secondary hyperparathyroidism of renal origin: Secondary | ICD-10-CM | POA: Diagnosis not present

## 2016-12-19 DIAGNOSIS — E1129 Type 2 diabetes mellitus with other diabetic kidney complication: Secondary | ICD-10-CM | POA: Diagnosis not present

## 2016-12-22 DIAGNOSIS — D631 Anemia in chronic kidney disease: Secondary | ICD-10-CM | POA: Diagnosis not present

## 2016-12-22 DIAGNOSIS — N2581 Secondary hyperparathyroidism of renal origin: Secondary | ICD-10-CM | POA: Diagnosis not present

## 2016-12-22 DIAGNOSIS — E1129 Type 2 diabetes mellitus with other diabetic kidney complication: Secondary | ICD-10-CM | POA: Diagnosis not present

## 2016-12-22 DIAGNOSIS — D509 Iron deficiency anemia, unspecified: Secondary | ICD-10-CM | POA: Diagnosis not present

## 2016-12-22 DIAGNOSIS — N186 End stage renal disease: Secondary | ICD-10-CM | POA: Diagnosis not present

## 2016-12-24 DIAGNOSIS — D631 Anemia in chronic kidney disease: Secondary | ICD-10-CM | POA: Diagnosis not present

## 2016-12-24 DIAGNOSIS — N2581 Secondary hyperparathyroidism of renal origin: Secondary | ICD-10-CM | POA: Diagnosis not present

## 2016-12-24 DIAGNOSIS — E1129 Type 2 diabetes mellitus with other diabetic kidney complication: Secondary | ICD-10-CM | POA: Diagnosis not present

## 2016-12-24 DIAGNOSIS — D509 Iron deficiency anemia, unspecified: Secondary | ICD-10-CM | POA: Diagnosis not present

## 2016-12-24 DIAGNOSIS — N186 End stage renal disease: Secondary | ICD-10-CM | POA: Diagnosis not present

## 2016-12-26 DIAGNOSIS — N2581 Secondary hyperparathyroidism of renal origin: Secondary | ICD-10-CM | POA: Diagnosis not present

## 2016-12-26 DIAGNOSIS — D631 Anemia in chronic kidney disease: Secondary | ICD-10-CM | POA: Diagnosis not present

## 2016-12-26 DIAGNOSIS — D509 Iron deficiency anemia, unspecified: Secondary | ICD-10-CM | POA: Diagnosis not present

## 2016-12-26 DIAGNOSIS — E1129 Type 2 diabetes mellitus with other diabetic kidney complication: Secondary | ICD-10-CM | POA: Diagnosis not present

## 2016-12-26 DIAGNOSIS — N186 End stage renal disease: Secondary | ICD-10-CM | POA: Diagnosis not present

## 2016-12-29 DIAGNOSIS — E1129 Type 2 diabetes mellitus with other diabetic kidney complication: Secondary | ICD-10-CM | POA: Diagnosis not present

## 2016-12-29 DIAGNOSIS — D509 Iron deficiency anemia, unspecified: Secondary | ICD-10-CM | POA: Diagnosis not present

## 2016-12-29 DIAGNOSIS — N186 End stage renal disease: Secondary | ICD-10-CM | POA: Diagnosis not present

## 2016-12-29 DIAGNOSIS — N2581 Secondary hyperparathyroidism of renal origin: Secondary | ICD-10-CM | POA: Diagnosis not present

## 2016-12-29 DIAGNOSIS — D631 Anemia in chronic kidney disease: Secondary | ICD-10-CM | POA: Diagnosis not present

## 2016-12-31 DIAGNOSIS — N186 End stage renal disease: Secondary | ICD-10-CM | POA: Diagnosis not present

## 2016-12-31 DIAGNOSIS — D509 Iron deficiency anemia, unspecified: Secondary | ICD-10-CM | POA: Diagnosis not present

## 2016-12-31 DIAGNOSIS — N2581 Secondary hyperparathyroidism of renal origin: Secondary | ICD-10-CM | POA: Diagnosis not present

## 2016-12-31 DIAGNOSIS — D631 Anemia in chronic kidney disease: Secondary | ICD-10-CM | POA: Diagnosis not present

## 2016-12-31 DIAGNOSIS — E1129 Type 2 diabetes mellitus with other diabetic kidney complication: Secondary | ICD-10-CM | POA: Diagnosis not present

## 2017-01-02 DIAGNOSIS — D631 Anemia in chronic kidney disease: Secondary | ICD-10-CM | POA: Diagnosis not present

## 2017-01-02 DIAGNOSIS — N2581 Secondary hyperparathyroidism of renal origin: Secondary | ICD-10-CM | POA: Diagnosis not present

## 2017-01-02 DIAGNOSIS — E1129 Type 2 diabetes mellitus with other diabetic kidney complication: Secondary | ICD-10-CM | POA: Diagnosis not present

## 2017-01-02 DIAGNOSIS — D509 Iron deficiency anemia, unspecified: Secondary | ICD-10-CM | POA: Diagnosis not present

## 2017-01-02 DIAGNOSIS — N186 End stage renal disease: Secondary | ICD-10-CM | POA: Diagnosis not present

## 2017-01-05 DIAGNOSIS — N2581 Secondary hyperparathyroidism of renal origin: Secondary | ICD-10-CM | POA: Diagnosis not present

## 2017-01-05 DIAGNOSIS — E1129 Type 2 diabetes mellitus with other diabetic kidney complication: Secondary | ICD-10-CM | POA: Diagnosis not present

## 2017-01-05 DIAGNOSIS — D631 Anemia in chronic kidney disease: Secondary | ICD-10-CM | POA: Diagnosis not present

## 2017-01-05 DIAGNOSIS — N186 End stage renal disease: Secondary | ICD-10-CM | POA: Diagnosis not present

## 2017-01-05 DIAGNOSIS — D509 Iron deficiency anemia, unspecified: Secondary | ICD-10-CM | POA: Diagnosis not present

## 2017-01-07 DIAGNOSIS — D509 Iron deficiency anemia, unspecified: Secondary | ICD-10-CM | POA: Diagnosis not present

## 2017-01-07 DIAGNOSIS — D631 Anemia in chronic kidney disease: Secondary | ICD-10-CM | POA: Diagnosis not present

## 2017-01-07 DIAGNOSIS — N186 End stage renal disease: Secondary | ICD-10-CM | POA: Diagnosis not present

## 2017-01-07 DIAGNOSIS — E1129 Type 2 diabetes mellitus with other diabetic kidney complication: Secondary | ICD-10-CM | POA: Diagnosis not present

## 2017-01-07 DIAGNOSIS — N2581 Secondary hyperparathyroidism of renal origin: Secondary | ICD-10-CM | POA: Diagnosis not present

## 2017-01-09 DIAGNOSIS — D509 Iron deficiency anemia, unspecified: Secondary | ICD-10-CM | POA: Diagnosis not present

## 2017-01-09 DIAGNOSIS — E1129 Type 2 diabetes mellitus with other diabetic kidney complication: Secondary | ICD-10-CM | POA: Diagnosis not present

## 2017-01-09 DIAGNOSIS — N186 End stage renal disease: Secondary | ICD-10-CM | POA: Diagnosis not present

## 2017-01-09 DIAGNOSIS — D631 Anemia in chronic kidney disease: Secondary | ICD-10-CM | POA: Diagnosis not present

## 2017-01-09 DIAGNOSIS — N2581 Secondary hyperparathyroidism of renal origin: Secondary | ICD-10-CM | POA: Diagnosis not present

## 2017-01-12 DIAGNOSIS — N186 End stage renal disease: Secondary | ICD-10-CM | POA: Diagnosis not present

## 2017-01-12 DIAGNOSIS — N2581 Secondary hyperparathyroidism of renal origin: Secondary | ICD-10-CM | POA: Diagnosis not present

## 2017-01-12 DIAGNOSIS — D631 Anemia in chronic kidney disease: Secondary | ICD-10-CM | POA: Diagnosis not present

## 2017-01-12 DIAGNOSIS — E1129 Type 2 diabetes mellitus with other diabetic kidney complication: Secondary | ICD-10-CM | POA: Diagnosis not present

## 2017-01-12 DIAGNOSIS — D509 Iron deficiency anemia, unspecified: Secondary | ICD-10-CM | POA: Diagnosis not present

## 2017-01-13 DIAGNOSIS — Z992 Dependence on renal dialysis: Secondary | ICD-10-CM | POA: Diagnosis not present

## 2017-01-13 DIAGNOSIS — N186 End stage renal disease: Secondary | ICD-10-CM | POA: Diagnosis not present

## 2017-01-13 DIAGNOSIS — E1129 Type 2 diabetes mellitus with other diabetic kidney complication: Secondary | ICD-10-CM | POA: Diagnosis not present

## 2017-01-14 DIAGNOSIS — N186 End stage renal disease: Secondary | ICD-10-CM | POA: Diagnosis not present

## 2017-01-14 DIAGNOSIS — D509 Iron deficiency anemia, unspecified: Secondary | ICD-10-CM | POA: Diagnosis not present

## 2017-01-14 DIAGNOSIS — N2581 Secondary hyperparathyroidism of renal origin: Secondary | ICD-10-CM | POA: Diagnosis not present

## 2017-01-14 DIAGNOSIS — E1129 Type 2 diabetes mellitus with other diabetic kidney complication: Secondary | ICD-10-CM | POA: Diagnosis not present

## 2017-01-16 DIAGNOSIS — N186 End stage renal disease: Secondary | ICD-10-CM | POA: Diagnosis not present

## 2017-01-16 DIAGNOSIS — D509 Iron deficiency anemia, unspecified: Secondary | ICD-10-CM | POA: Diagnosis not present

## 2017-01-16 DIAGNOSIS — E1129 Type 2 diabetes mellitus with other diabetic kidney complication: Secondary | ICD-10-CM | POA: Diagnosis not present

## 2017-01-16 DIAGNOSIS — N2581 Secondary hyperparathyroidism of renal origin: Secondary | ICD-10-CM | POA: Diagnosis not present

## 2017-01-19 DIAGNOSIS — N2581 Secondary hyperparathyroidism of renal origin: Secondary | ICD-10-CM | POA: Diagnosis not present

## 2017-01-19 DIAGNOSIS — D509 Iron deficiency anemia, unspecified: Secondary | ICD-10-CM | POA: Diagnosis not present

## 2017-01-19 DIAGNOSIS — E1129 Type 2 diabetes mellitus with other diabetic kidney complication: Secondary | ICD-10-CM | POA: Diagnosis not present

## 2017-01-19 DIAGNOSIS — N186 End stage renal disease: Secondary | ICD-10-CM | POA: Diagnosis not present

## 2017-01-21 DIAGNOSIS — D509 Iron deficiency anemia, unspecified: Secondary | ICD-10-CM | POA: Diagnosis not present

## 2017-01-21 DIAGNOSIS — E1129 Type 2 diabetes mellitus with other diabetic kidney complication: Secondary | ICD-10-CM | POA: Diagnosis not present

## 2017-01-21 DIAGNOSIS — N2581 Secondary hyperparathyroidism of renal origin: Secondary | ICD-10-CM | POA: Diagnosis not present

## 2017-01-21 DIAGNOSIS — N186 End stage renal disease: Secondary | ICD-10-CM | POA: Diagnosis not present

## 2017-01-23 DIAGNOSIS — E1129 Type 2 diabetes mellitus with other diabetic kidney complication: Secondary | ICD-10-CM | POA: Diagnosis not present

## 2017-01-23 DIAGNOSIS — N186 End stage renal disease: Secondary | ICD-10-CM | POA: Diagnosis not present

## 2017-01-23 DIAGNOSIS — D509 Iron deficiency anemia, unspecified: Secondary | ICD-10-CM | POA: Diagnosis not present

## 2017-01-23 DIAGNOSIS — N2581 Secondary hyperparathyroidism of renal origin: Secondary | ICD-10-CM | POA: Diagnosis not present

## 2017-01-26 DIAGNOSIS — N2581 Secondary hyperparathyroidism of renal origin: Secondary | ICD-10-CM | POA: Diagnosis not present

## 2017-01-26 DIAGNOSIS — E1129 Type 2 diabetes mellitus with other diabetic kidney complication: Secondary | ICD-10-CM | POA: Diagnosis not present

## 2017-01-26 DIAGNOSIS — N186 End stage renal disease: Secondary | ICD-10-CM | POA: Diagnosis not present

## 2017-01-26 DIAGNOSIS — D509 Iron deficiency anemia, unspecified: Secondary | ICD-10-CM | POA: Diagnosis not present

## 2017-01-28 DIAGNOSIS — D509 Iron deficiency anemia, unspecified: Secondary | ICD-10-CM | POA: Diagnosis not present

## 2017-01-28 DIAGNOSIS — N2581 Secondary hyperparathyroidism of renal origin: Secondary | ICD-10-CM | POA: Diagnosis not present

## 2017-01-28 DIAGNOSIS — N186 End stage renal disease: Secondary | ICD-10-CM | POA: Diagnosis not present

## 2017-01-28 DIAGNOSIS — E1129 Type 2 diabetes mellitus with other diabetic kidney complication: Secondary | ICD-10-CM | POA: Diagnosis not present

## 2017-01-30 DIAGNOSIS — D509 Iron deficiency anemia, unspecified: Secondary | ICD-10-CM | POA: Diagnosis not present

## 2017-01-30 DIAGNOSIS — N2581 Secondary hyperparathyroidism of renal origin: Secondary | ICD-10-CM | POA: Diagnosis not present

## 2017-01-30 DIAGNOSIS — N186 End stage renal disease: Secondary | ICD-10-CM | POA: Diagnosis not present

## 2017-01-30 DIAGNOSIS — E1129 Type 2 diabetes mellitus with other diabetic kidney complication: Secondary | ICD-10-CM | POA: Diagnosis not present

## 2017-02-02 DIAGNOSIS — E1129 Type 2 diabetes mellitus with other diabetic kidney complication: Secondary | ICD-10-CM | POA: Diagnosis not present

## 2017-02-02 DIAGNOSIS — N186 End stage renal disease: Secondary | ICD-10-CM | POA: Diagnosis not present

## 2017-02-02 DIAGNOSIS — D509 Iron deficiency anemia, unspecified: Secondary | ICD-10-CM | POA: Diagnosis not present

## 2017-02-02 DIAGNOSIS — N2581 Secondary hyperparathyroidism of renal origin: Secondary | ICD-10-CM | POA: Diagnosis not present

## 2017-02-04 DIAGNOSIS — N186 End stage renal disease: Secondary | ICD-10-CM | POA: Diagnosis not present

## 2017-02-04 DIAGNOSIS — E1129 Type 2 diabetes mellitus with other diabetic kidney complication: Secondary | ICD-10-CM | POA: Diagnosis not present

## 2017-02-04 DIAGNOSIS — D509 Iron deficiency anemia, unspecified: Secondary | ICD-10-CM | POA: Diagnosis not present

## 2017-02-04 DIAGNOSIS — N2581 Secondary hyperparathyroidism of renal origin: Secondary | ICD-10-CM | POA: Diagnosis not present

## 2017-02-06 DIAGNOSIS — N2581 Secondary hyperparathyroidism of renal origin: Secondary | ICD-10-CM | POA: Diagnosis not present

## 2017-02-06 DIAGNOSIS — E1129 Type 2 diabetes mellitus with other diabetic kidney complication: Secondary | ICD-10-CM | POA: Diagnosis not present

## 2017-02-06 DIAGNOSIS — N186 End stage renal disease: Secondary | ICD-10-CM | POA: Diagnosis not present

## 2017-02-06 DIAGNOSIS — D509 Iron deficiency anemia, unspecified: Secondary | ICD-10-CM | POA: Diagnosis not present

## 2017-02-09 DIAGNOSIS — D509 Iron deficiency anemia, unspecified: Secondary | ICD-10-CM | POA: Diagnosis not present

## 2017-02-09 DIAGNOSIS — E1129 Type 2 diabetes mellitus with other diabetic kidney complication: Secondary | ICD-10-CM | POA: Diagnosis not present

## 2017-02-09 DIAGNOSIS — N2581 Secondary hyperparathyroidism of renal origin: Secondary | ICD-10-CM | POA: Diagnosis not present

## 2017-02-09 DIAGNOSIS — N186 End stage renal disease: Secondary | ICD-10-CM | POA: Diagnosis not present

## 2017-02-11 DIAGNOSIS — N186 End stage renal disease: Secondary | ICD-10-CM | POA: Diagnosis not present

## 2017-02-11 DIAGNOSIS — N2581 Secondary hyperparathyroidism of renal origin: Secondary | ICD-10-CM | POA: Diagnosis not present

## 2017-02-11 DIAGNOSIS — D509 Iron deficiency anemia, unspecified: Secondary | ICD-10-CM | POA: Diagnosis not present

## 2017-02-11 DIAGNOSIS — E1129 Type 2 diabetes mellitus with other diabetic kidney complication: Secondary | ICD-10-CM | POA: Diagnosis not present

## 2017-02-13 DIAGNOSIS — E1129 Type 2 diabetes mellitus with other diabetic kidney complication: Secondary | ICD-10-CM | POA: Diagnosis not present

## 2017-02-13 DIAGNOSIS — N186 End stage renal disease: Secondary | ICD-10-CM | POA: Diagnosis not present

## 2017-02-13 DIAGNOSIS — D509 Iron deficiency anemia, unspecified: Secondary | ICD-10-CM | POA: Diagnosis not present

## 2017-02-13 DIAGNOSIS — Z992 Dependence on renal dialysis: Secondary | ICD-10-CM | POA: Diagnosis not present

## 2017-02-13 DIAGNOSIS — N2581 Secondary hyperparathyroidism of renal origin: Secondary | ICD-10-CM | POA: Diagnosis not present

## 2017-02-16 DIAGNOSIS — E1129 Type 2 diabetes mellitus with other diabetic kidney complication: Secondary | ICD-10-CM | POA: Diagnosis not present

## 2017-02-16 DIAGNOSIS — N186 End stage renal disease: Secondary | ICD-10-CM | POA: Diagnosis not present

## 2017-02-16 DIAGNOSIS — D631 Anemia in chronic kidney disease: Secondary | ICD-10-CM | POA: Diagnosis not present

## 2017-02-16 DIAGNOSIS — N2581 Secondary hyperparathyroidism of renal origin: Secondary | ICD-10-CM | POA: Diagnosis not present

## 2017-02-18 DIAGNOSIS — N2581 Secondary hyperparathyroidism of renal origin: Secondary | ICD-10-CM | POA: Diagnosis not present

## 2017-02-18 DIAGNOSIS — D631 Anemia in chronic kidney disease: Secondary | ICD-10-CM | POA: Diagnosis not present

## 2017-02-18 DIAGNOSIS — N186 End stage renal disease: Secondary | ICD-10-CM | POA: Diagnosis not present

## 2017-02-18 DIAGNOSIS — E1129 Type 2 diabetes mellitus with other diabetic kidney complication: Secondary | ICD-10-CM | POA: Diagnosis not present

## 2017-02-20 DIAGNOSIS — E1129 Type 2 diabetes mellitus with other diabetic kidney complication: Secondary | ICD-10-CM | POA: Diagnosis not present

## 2017-02-20 DIAGNOSIS — N2581 Secondary hyperparathyroidism of renal origin: Secondary | ICD-10-CM | POA: Diagnosis not present

## 2017-02-20 DIAGNOSIS — N186 End stage renal disease: Secondary | ICD-10-CM | POA: Diagnosis not present

## 2017-02-20 DIAGNOSIS — D631 Anemia in chronic kidney disease: Secondary | ICD-10-CM | POA: Diagnosis not present

## 2017-02-23 DIAGNOSIS — E1129 Type 2 diabetes mellitus with other diabetic kidney complication: Secondary | ICD-10-CM | POA: Diagnosis not present

## 2017-02-23 DIAGNOSIS — N186 End stage renal disease: Secondary | ICD-10-CM | POA: Diagnosis not present

## 2017-02-23 DIAGNOSIS — N2581 Secondary hyperparathyroidism of renal origin: Secondary | ICD-10-CM | POA: Diagnosis not present

## 2017-02-23 DIAGNOSIS — D631 Anemia in chronic kidney disease: Secondary | ICD-10-CM | POA: Diagnosis not present

## 2017-02-25 DIAGNOSIS — E1129 Type 2 diabetes mellitus with other diabetic kidney complication: Secondary | ICD-10-CM | POA: Diagnosis not present

## 2017-02-25 DIAGNOSIS — N186 End stage renal disease: Secondary | ICD-10-CM | POA: Diagnosis not present

## 2017-02-25 DIAGNOSIS — D631 Anemia in chronic kidney disease: Secondary | ICD-10-CM | POA: Diagnosis not present

## 2017-02-25 DIAGNOSIS — N2581 Secondary hyperparathyroidism of renal origin: Secondary | ICD-10-CM | POA: Diagnosis not present

## 2017-02-27 DIAGNOSIS — N186 End stage renal disease: Secondary | ICD-10-CM | POA: Diagnosis not present

## 2017-02-27 DIAGNOSIS — D631 Anemia in chronic kidney disease: Secondary | ICD-10-CM | POA: Diagnosis not present

## 2017-02-27 DIAGNOSIS — N2581 Secondary hyperparathyroidism of renal origin: Secondary | ICD-10-CM | POA: Diagnosis not present

## 2017-02-27 DIAGNOSIS — E1129 Type 2 diabetes mellitus with other diabetic kidney complication: Secondary | ICD-10-CM | POA: Diagnosis not present

## 2017-03-02 DIAGNOSIS — E1129 Type 2 diabetes mellitus with other diabetic kidney complication: Secondary | ICD-10-CM | POA: Diagnosis not present

## 2017-03-02 DIAGNOSIS — D631 Anemia in chronic kidney disease: Secondary | ICD-10-CM | POA: Diagnosis not present

## 2017-03-02 DIAGNOSIS — N2581 Secondary hyperparathyroidism of renal origin: Secondary | ICD-10-CM | POA: Diagnosis not present

## 2017-03-02 DIAGNOSIS — N186 End stage renal disease: Secondary | ICD-10-CM | POA: Diagnosis not present

## 2017-03-04 DIAGNOSIS — N2581 Secondary hyperparathyroidism of renal origin: Secondary | ICD-10-CM | POA: Diagnosis not present

## 2017-03-04 DIAGNOSIS — D631 Anemia in chronic kidney disease: Secondary | ICD-10-CM | POA: Diagnosis not present

## 2017-03-04 DIAGNOSIS — E1129 Type 2 diabetes mellitus with other diabetic kidney complication: Secondary | ICD-10-CM | POA: Diagnosis not present

## 2017-03-04 DIAGNOSIS — N186 End stage renal disease: Secondary | ICD-10-CM | POA: Diagnosis not present

## 2017-03-06 DIAGNOSIS — N186 End stage renal disease: Secondary | ICD-10-CM | POA: Diagnosis not present

## 2017-03-06 DIAGNOSIS — D631 Anemia in chronic kidney disease: Secondary | ICD-10-CM | POA: Diagnosis not present

## 2017-03-06 DIAGNOSIS — N2581 Secondary hyperparathyroidism of renal origin: Secondary | ICD-10-CM | POA: Diagnosis not present

## 2017-03-06 DIAGNOSIS — E1129 Type 2 diabetes mellitus with other diabetic kidney complication: Secondary | ICD-10-CM | POA: Diagnosis not present

## 2017-03-09 DIAGNOSIS — E1129 Type 2 diabetes mellitus with other diabetic kidney complication: Secondary | ICD-10-CM | POA: Diagnosis not present

## 2017-03-09 DIAGNOSIS — N186 End stage renal disease: Secondary | ICD-10-CM | POA: Diagnosis not present

## 2017-03-09 DIAGNOSIS — N2581 Secondary hyperparathyroidism of renal origin: Secondary | ICD-10-CM | POA: Diagnosis not present

## 2017-03-09 DIAGNOSIS — D631 Anemia in chronic kidney disease: Secondary | ICD-10-CM | POA: Diagnosis not present

## 2017-03-11 DIAGNOSIS — E1129 Type 2 diabetes mellitus with other diabetic kidney complication: Secondary | ICD-10-CM | POA: Diagnosis not present

## 2017-03-11 DIAGNOSIS — N2581 Secondary hyperparathyroidism of renal origin: Secondary | ICD-10-CM | POA: Diagnosis not present

## 2017-03-11 DIAGNOSIS — D631 Anemia in chronic kidney disease: Secondary | ICD-10-CM | POA: Diagnosis not present

## 2017-03-11 DIAGNOSIS — N186 End stage renal disease: Secondary | ICD-10-CM | POA: Diagnosis not present

## 2017-03-13 DIAGNOSIS — D631 Anemia in chronic kidney disease: Secondary | ICD-10-CM | POA: Diagnosis not present

## 2017-03-13 DIAGNOSIS — N2581 Secondary hyperparathyroidism of renal origin: Secondary | ICD-10-CM | POA: Diagnosis not present

## 2017-03-13 DIAGNOSIS — N186 End stage renal disease: Secondary | ICD-10-CM | POA: Diagnosis not present

## 2017-03-13 DIAGNOSIS — E1129 Type 2 diabetes mellitus with other diabetic kidney complication: Secondary | ICD-10-CM | POA: Diagnosis not present

## 2017-03-15 DIAGNOSIS — N186 End stage renal disease: Secondary | ICD-10-CM | POA: Diagnosis not present

## 2017-03-15 DIAGNOSIS — E1129 Type 2 diabetes mellitus with other diabetic kidney complication: Secondary | ICD-10-CM | POA: Diagnosis not present

## 2017-03-15 DIAGNOSIS — Z992 Dependence on renal dialysis: Secondary | ICD-10-CM | POA: Diagnosis not present

## 2017-03-16 DIAGNOSIS — N2581 Secondary hyperparathyroidism of renal origin: Secondary | ICD-10-CM | POA: Diagnosis not present

## 2017-03-16 DIAGNOSIS — N186 End stage renal disease: Secondary | ICD-10-CM | POA: Diagnosis not present

## 2017-03-16 DIAGNOSIS — D631 Anemia in chronic kidney disease: Secondary | ICD-10-CM | POA: Diagnosis not present

## 2017-03-16 DIAGNOSIS — E1129 Type 2 diabetes mellitus with other diabetic kidney complication: Secondary | ICD-10-CM | POA: Diagnosis not present

## 2017-03-16 DIAGNOSIS — D509 Iron deficiency anemia, unspecified: Secondary | ICD-10-CM | POA: Diagnosis not present

## 2017-03-18 DIAGNOSIS — D631 Anemia in chronic kidney disease: Secondary | ICD-10-CM | POA: Diagnosis not present

## 2017-03-18 DIAGNOSIS — E1129 Type 2 diabetes mellitus with other diabetic kidney complication: Secondary | ICD-10-CM | POA: Diagnosis not present

## 2017-03-18 DIAGNOSIS — D509 Iron deficiency anemia, unspecified: Secondary | ICD-10-CM | POA: Diagnosis not present

## 2017-03-18 DIAGNOSIS — N2581 Secondary hyperparathyroidism of renal origin: Secondary | ICD-10-CM | POA: Diagnosis not present

## 2017-03-18 DIAGNOSIS — N186 End stage renal disease: Secondary | ICD-10-CM | POA: Diagnosis not present

## 2017-03-20 DIAGNOSIS — E1129 Type 2 diabetes mellitus with other diabetic kidney complication: Secondary | ICD-10-CM | POA: Diagnosis not present

## 2017-03-20 DIAGNOSIS — N186 End stage renal disease: Secondary | ICD-10-CM | POA: Diagnosis not present

## 2017-03-20 DIAGNOSIS — D509 Iron deficiency anemia, unspecified: Secondary | ICD-10-CM | POA: Diagnosis not present

## 2017-03-20 DIAGNOSIS — N2581 Secondary hyperparathyroidism of renal origin: Secondary | ICD-10-CM | POA: Diagnosis not present

## 2017-03-20 DIAGNOSIS — D631 Anemia in chronic kidney disease: Secondary | ICD-10-CM | POA: Diagnosis not present

## 2017-03-23 DIAGNOSIS — E1129 Type 2 diabetes mellitus with other diabetic kidney complication: Secondary | ICD-10-CM | POA: Diagnosis not present

## 2017-03-23 DIAGNOSIS — D509 Iron deficiency anemia, unspecified: Secondary | ICD-10-CM | POA: Diagnosis not present

## 2017-03-23 DIAGNOSIS — N186 End stage renal disease: Secondary | ICD-10-CM | POA: Diagnosis not present

## 2017-03-23 DIAGNOSIS — N2581 Secondary hyperparathyroidism of renal origin: Secondary | ICD-10-CM | POA: Diagnosis not present

## 2017-03-23 DIAGNOSIS — D631 Anemia in chronic kidney disease: Secondary | ICD-10-CM | POA: Diagnosis not present

## 2017-03-25 DIAGNOSIS — D631 Anemia in chronic kidney disease: Secondary | ICD-10-CM | POA: Diagnosis not present

## 2017-03-25 DIAGNOSIS — N186 End stage renal disease: Secondary | ICD-10-CM | POA: Diagnosis not present

## 2017-03-25 DIAGNOSIS — E1129 Type 2 diabetes mellitus with other diabetic kidney complication: Secondary | ICD-10-CM | POA: Diagnosis not present

## 2017-03-25 DIAGNOSIS — N2581 Secondary hyperparathyroidism of renal origin: Secondary | ICD-10-CM | POA: Diagnosis not present

## 2017-03-25 DIAGNOSIS — D509 Iron deficiency anemia, unspecified: Secondary | ICD-10-CM | POA: Diagnosis not present

## 2017-03-30 DIAGNOSIS — N2581 Secondary hyperparathyroidism of renal origin: Secondary | ICD-10-CM | POA: Diagnosis not present

## 2017-03-30 DIAGNOSIS — E1129 Type 2 diabetes mellitus with other diabetic kidney complication: Secondary | ICD-10-CM | POA: Diagnosis not present

## 2017-03-30 DIAGNOSIS — D509 Iron deficiency anemia, unspecified: Secondary | ICD-10-CM | POA: Diagnosis not present

## 2017-03-30 DIAGNOSIS — D631 Anemia in chronic kidney disease: Secondary | ICD-10-CM | POA: Diagnosis not present

## 2017-03-30 DIAGNOSIS — N186 End stage renal disease: Secondary | ICD-10-CM | POA: Diagnosis not present

## 2017-04-01 DIAGNOSIS — N186 End stage renal disease: Secondary | ICD-10-CM | POA: Diagnosis not present

## 2017-04-01 DIAGNOSIS — D631 Anemia in chronic kidney disease: Secondary | ICD-10-CM | POA: Diagnosis not present

## 2017-04-01 DIAGNOSIS — E1129 Type 2 diabetes mellitus with other diabetic kidney complication: Secondary | ICD-10-CM | POA: Diagnosis not present

## 2017-04-01 DIAGNOSIS — N2581 Secondary hyperparathyroidism of renal origin: Secondary | ICD-10-CM | POA: Diagnosis not present

## 2017-04-01 DIAGNOSIS — D509 Iron deficiency anemia, unspecified: Secondary | ICD-10-CM | POA: Diagnosis not present

## 2017-04-03 DIAGNOSIS — E1129 Type 2 diabetes mellitus with other diabetic kidney complication: Secondary | ICD-10-CM | POA: Diagnosis not present

## 2017-04-03 DIAGNOSIS — D631 Anemia in chronic kidney disease: Secondary | ICD-10-CM | POA: Diagnosis not present

## 2017-04-03 DIAGNOSIS — N186 End stage renal disease: Secondary | ICD-10-CM | POA: Diagnosis not present

## 2017-04-03 DIAGNOSIS — N2581 Secondary hyperparathyroidism of renal origin: Secondary | ICD-10-CM | POA: Diagnosis not present

## 2017-04-03 DIAGNOSIS — D509 Iron deficiency anemia, unspecified: Secondary | ICD-10-CM | POA: Diagnosis not present

## 2017-04-06 DIAGNOSIS — D631 Anemia in chronic kidney disease: Secondary | ICD-10-CM | POA: Diagnosis not present

## 2017-04-06 DIAGNOSIS — E1129 Type 2 diabetes mellitus with other diabetic kidney complication: Secondary | ICD-10-CM | POA: Diagnosis not present

## 2017-04-06 DIAGNOSIS — N186 End stage renal disease: Secondary | ICD-10-CM | POA: Diagnosis not present

## 2017-04-06 DIAGNOSIS — N2581 Secondary hyperparathyroidism of renal origin: Secondary | ICD-10-CM | POA: Diagnosis not present

## 2017-04-06 DIAGNOSIS — D509 Iron deficiency anemia, unspecified: Secondary | ICD-10-CM | POA: Diagnosis not present

## 2017-04-08 DIAGNOSIS — D631 Anemia in chronic kidney disease: Secondary | ICD-10-CM | POA: Diagnosis not present

## 2017-04-08 DIAGNOSIS — N2581 Secondary hyperparathyroidism of renal origin: Secondary | ICD-10-CM | POA: Diagnosis not present

## 2017-04-08 DIAGNOSIS — N186 End stage renal disease: Secondary | ICD-10-CM | POA: Diagnosis not present

## 2017-04-08 DIAGNOSIS — D509 Iron deficiency anemia, unspecified: Secondary | ICD-10-CM | POA: Diagnosis not present

## 2017-04-08 DIAGNOSIS — E1129 Type 2 diabetes mellitus with other diabetic kidney complication: Secondary | ICD-10-CM | POA: Diagnosis not present

## 2017-04-10 DIAGNOSIS — D631 Anemia in chronic kidney disease: Secondary | ICD-10-CM | POA: Diagnosis not present

## 2017-04-10 DIAGNOSIS — E1129 Type 2 diabetes mellitus with other diabetic kidney complication: Secondary | ICD-10-CM | POA: Diagnosis not present

## 2017-04-10 DIAGNOSIS — N2581 Secondary hyperparathyroidism of renal origin: Secondary | ICD-10-CM | POA: Diagnosis not present

## 2017-04-10 DIAGNOSIS — N186 End stage renal disease: Secondary | ICD-10-CM | POA: Diagnosis not present

## 2017-04-10 DIAGNOSIS — D509 Iron deficiency anemia, unspecified: Secondary | ICD-10-CM | POA: Diagnosis not present

## 2017-04-13 DIAGNOSIS — D509 Iron deficiency anemia, unspecified: Secondary | ICD-10-CM | POA: Diagnosis not present

## 2017-04-13 DIAGNOSIS — N2581 Secondary hyperparathyroidism of renal origin: Secondary | ICD-10-CM | POA: Diagnosis not present

## 2017-04-13 DIAGNOSIS — D631 Anemia in chronic kidney disease: Secondary | ICD-10-CM | POA: Diagnosis not present

## 2017-04-13 DIAGNOSIS — E1129 Type 2 diabetes mellitus with other diabetic kidney complication: Secondary | ICD-10-CM | POA: Diagnosis not present

## 2017-04-13 DIAGNOSIS — N186 End stage renal disease: Secondary | ICD-10-CM | POA: Diagnosis not present

## 2017-04-15 DIAGNOSIS — E1129 Type 2 diabetes mellitus with other diabetic kidney complication: Secondary | ICD-10-CM | POA: Diagnosis not present

## 2017-04-15 DIAGNOSIS — D631 Anemia in chronic kidney disease: Secondary | ICD-10-CM | POA: Diagnosis not present

## 2017-04-15 DIAGNOSIS — D509 Iron deficiency anemia, unspecified: Secondary | ICD-10-CM | POA: Diagnosis not present

## 2017-04-15 DIAGNOSIS — Z992 Dependence on renal dialysis: Secondary | ICD-10-CM | POA: Diagnosis not present

## 2017-04-15 DIAGNOSIS — N2581 Secondary hyperparathyroidism of renal origin: Secondary | ICD-10-CM | POA: Diagnosis not present

## 2017-04-15 DIAGNOSIS — N186 End stage renal disease: Secondary | ICD-10-CM | POA: Diagnosis not present

## 2017-04-17 DIAGNOSIS — N186 End stage renal disease: Secondary | ICD-10-CM | POA: Diagnosis not present

## 2017-04-17 DIAGNOSIS — E1129 Type 2 diabetes mellitus with other diabetic kidney complication: Secondary | ICD-10-CM | POA: Diagnosis not present

## 2017-04-17 DIAGNOSIS — D509 Iron deficiency anemia, unspecified: Secondary | ICD-10-CM | POA: Diagnosis not present

## 2017-04-17 DIAGNOSIS — N2581 Secondary hyperparathyroidism of renal origin: Secondary | ICD-10-CM | POA: Diagnosis not present

## 2017-04-19 DIAGNOSIS — Z6823 Body mass index (BMI) 23.0-23.9, adult: Secondary | ICD-10-CM | POA: Diagnosis not present

## 2017-04-19 DIAGNOSIS — I739 Peripheral vascular disease, unspecified: Secondary | ICD-10-CM | POA: Diagnosis not present

## 2017-04-19 DIAGNOSIS — J449 Chronic obstructive pulmonary disease, unspecified: Secondary | ICD-10-CM | POA: Diagnosis not present

## 2017-04-19 DIAGNOSIS — G629 Polyneuropathy, unspecified: Secondary | ICD-10-CM | POA: Diagnosis not present

## 2017-04-19 DIAGNOSIS — I10 Essential (primary) hypertension: Secondary | ICD-10-CM | POA: Diagnosis not present

## 2017-04-19 DIAGNOSIS — N186 End stage renal disease: Secondary | ICD-10-CM | POA: Diagnosis not present

## 2017-04-19 DIAGNOSIS — E1121 Type 2 diabetes mellitus with diabetic nephropathy: Secondary | ICD-10-CM | POA: Diagnosis not present

## 2017-04-19 DIAGNOSIS — Z89511 Acquired absence of right leg below knee: Secondary | ICD-10-CM | POA: Diagnosis not present

## 2017-04-19 DIAGNOSIS — H3530 Unspecified macular degeneration: Secondary | ICD-10-CM | POA: Diagnosis not present

## 2017-04-19 DIAGNOSIS — Z125 Encounter for screening for malignant neoplasm of prostate: Secondary | ICD-10-CM | POA: Diagnosis not present

## 2017-04-19 DIAGNOSIS — K219 Gastro-esophageal reflux disease without esophagitis: Secondary | ICD-10-CM | POA: Diagnosis not present

## 2017-04-19 DIAGNOSIS — I509 Heart failure, unspecified: Secondary | ICD-10-CM | POA: Diagnosis not present

## 2017-04-19 DIAGNOSIS — E785 Hyperlipidemia, unspecified: Secondary | ICD-10-CM | POA: Diagnosis not present

## 2017-04-20 DIAGNOSIS — N2581 Secondary hyperparathyroidism of renal origin: Secondary | ICD-10-CM | POA: Diagnosis not present

## 2017-04-20 DIAGNOSIS — D509 Iron deficiency anemia, unspecified: Secondary | ICD-10-CM | POA: Diagnosis not present

## 2017-04-20 DIAGNOSIS — E1129 Type 2 diabetes mellitus with other diabetic kidney complication: Secondary | ICD-10-CM | POA: Diagnosis not present

## 2017-04-20 DIAGNOSIS — N186 End stage renal disease: Secondary | ICD-10-CM | POA: Diagnosis not present

## 2017-04-22 DIAGNOSIS — N186 End stage renal disease: Secondary | ICD-10-CM | POA: Diagnosis not present

## 2017-04-22 DIAGNOSIS — D509 Iron deficiency anemia, unspecified: Secondary | ICD-10-CM | POA: Diagnosis not present

## 2017-04-22 DIAGNOSIS — E1129 Type 2 diabetes mellitus with other diabetic kidney complication: Secondary | ICD-10-CM | POA: Diagnosis not present

## 2017-04-22 DIAGNOSIS — N2581 Secondary hyperparathyroidism of renal origin: Secondary | ICD-10-CM | POA: Diagnosis not present

## 2017-04-24 DIAGNOSIS — N186 End stage renal disease: Secondary | ICD-10-CM | POA: Diagnosis not present

## 2017-04-24 DIAGNOSIS — N2581 Secondary hyperparathyroidism of renal origin: Secondary | ICD-10-CM | POA: Diagnosis not present

## 2017-04-24 DIAGNOSIS — D509 Iron deficiency anemia, unspecified: Secondary | ICD-10-CM | POA: Diagnosis not present

## 2017-04-24 DIAGNOSIS — E1129 Type 2 diabetes mellitus with other diabetic kidney complication: Secondary | ICD-10-CM | POA: Diagnosis not present

## 2017-04-27 DIAGNOSIS — D509 Iron deficiency anemia, unspecified: Secondary | ICD-10-CM | POA: Diagnosis not present

## 2017-04-27 DIAGNOSIS — N2581 Secondary hyperparathyroidism of renal origin: Secondary | ICD-10-CM | POA: Diagnosis not present

## 2017-04-27 DIAGNOSIS — E1129 Type 2 diabetes mellitus with other diabetic kidney complication: Secondary | ICD-10-CM | POA: Diagnosis not present

## 2017-04-27 DIAGNOSIS — N186 End stage renal disease: Secondary | ICD-10-CM | POA: Diagnosis not present

## 2017-04-29 DIAGNOSIS — N186 End stage renal disease: Secondary | ICD-10-CM | POA: Diagnosis not present

## 2017-04-29 DIAGNOSIS — D509 Iron deficiency anemia, unspecified: Secondary | ICD-10-CM | POA: Diagnosis not present

## 2017-04-29 DIAGNOSIS — N2581 Secondary hyperparathyroidism of renal origin: Secondary | ICD-10-CM | POA: Diagnosis not present

## 2017-04-29 DIAGNOSIS — E1129 Type 2 diabetes mellitus with other diabetic kidney complication: Secondary | ICD-10-CM | POA: Diagnosis not present

## 2017-05-01 DIAGNOSIS — D509 Iron deficiency anemia, unspecified: Secondary | ICD-10-CM | POA: Diagnosis not present

## 2017-05-01 DIAGNOSIS — N2581 Secondary hyperparathyroidism of renal origin: Secondary | ICD-10-CM | POA: Diagnosis not present

## 2017-05-01 DIAGNOSIS — N186 End stage renal disease: Secondary | ICD-10-CM | POA: Diagnosis not present

## 2017-05-01 DIAGNOSIS — E1129 Type 2 diabetes mellitus with other diabetic kidney complication: Secondary | ICD-10-CM | POA: Diagnosis not present

## 2017-05-04 DIAGNOSIS — N2581 Secondary hyperparathyroidism of renal origin: Secondary | ICD-10-CM | POA: Diagnosis not present

## 2017-05-04 DIAGNOSIS — D509 Iron deficiency anemia, unspecified: Secondary | ICD-10-CM | POA: Diagnosis not present

## 2017-05-04 DIAGNOSIS — E1129 Type 2 diabetes mellitus with other diabetic kidney complication: Secondary | ICD-10-CM | POA: Diagnosis not present

## 2017-05-04 DIAGNOSIS — N186 End stage renal disease: Secondary | ICD-10-CM | POA: Diagnosis not present

## 2017-05-06 DIAGNOSIS — D509 Iron deficiency anemia, unspecified: Secondary | ICD-10-CM | POA: Diagnosis not present

## 2017-05-06 DIAGNOSIS — E1129 Type 2 diabetes mellitus with other diabetic kidney complication: Secondary | ICD-10-CM | POA: Diagnosis not present

## 2017-05-06 DIAGNOSIS — N186 End stage renal disease: Secondary | ICD-10-CM | POA: Diagnosis not present

## 2017-05-06 DIAGNOSIS — N2581 Secondary hyperparathyroidism of renal origin: Secondary | ICD-10-CM | POA: Diagnosis not present

## 2017-05-08 DIAGNOSIS — D509 Iron deficiency anemia, unspecified: Secondary | ICD-10-CM | POA: Diagnosis not present

## 2017-05-08 DIAGNOSIS — N2581 Secondary hyperparathyroidism of renal origin: Secondary | ICD-10-CM | POA: Diagnosis not present

## 2017-05-08 DIAGNOSIS — E1129 Type 2 diabetes mellitus with other diabetic kidney complication: Secondary | ICD-10-CM | POA: Diagnosis not present

## 2017-05-08 DIAGNOSIS — N186 End stage renal disease: Secondary | ICD-10-CM | POA: Diagnosis not present

## 2017-05-11 DIAGNOSIS — N186 End stage renal disease: Secondary | ICD-10-CM | POA: Diagnosis not present

## 2017-05-11 DIAGNOSIS — D509 Iron deficiency anemia, unspecified: Secondary | ICD-10-CM | POA: Diagnosis not present

## 2017-05-11 DIAGNOSIS — N2581 Secondary hyperparathyroidism of renal origin: Secondary | ICD-10-CM | POA: Diagnosis not present

## 2017-05-11 DIAGNOSIS — E1129 Type 2 diabetes mellitus with other diabetic kidney complication: Secondary | ICD-10-CM | POA: Diagnosis not present

## 2017-05-13 DIAGNOSIS — D509 Iron deficiency anemia, unspecified: Secondary | ICD-10-CM | POA: Diagnosis not present

## 2017-05-13 DIAGNOSIS — N186 End stage renal disease: Secondary | ICD-10-CM | POA: Diagnosis not present

## 2017-05-13 DIAGNOSIS — E1129 Type 2 diabetes mellitus with other diabetic kidney complication: Secondary | ICD-10-CM | POA: Diagnosis not present

## 2017-05-13 DIAGNOSIS — N2581 Secondary hyperparathyroidism of renal origin: Secondary | ICD-10-CM | POA: Diagnosis not present

## 2017-05-15 DIAGNOSIS — E1129 Type 2 diabetes mellitus with other diabetic kidney complication: Secondary | ICD-10-CM | POA: Diagnosis not present

## 2017-05-15 DIAGNOSIS — D509 Iron deficiency anemia, unspecified: Secondary | ICD-10-CM | POA: Diagnosis not present

## 2017-05-15 DIAGNOSIS — Z992 Dependence on renal dialysis: Secondary | ICD-10-CM | POA: Diagnosis not present

## 2017-05-15 DIAGNOSIS — N186 End stage renal disease: Secondary | ICD-10-CM | POA: Diagnosis not present

## 2017-05-15 DIAGNOSIS — N2581 Secondary hyperparathyroidism of renal origin: Secondary | ICD-10-CM | POA: Diagnosis not present

## 2017-05-18 DIAGNOSIS — N2581 Secondary hyperparathyroidism of renal origin: Secondary | ICD-10-CM | POA: Diagnosis not present

## 2017-05-18 DIAGNOSIS — E1129 Type 2 diabetes mellitus with other diabetic kidney complication: Secondary | ICD-10-CM | POA: Diagnosis not present

## 2017-05-18 DIAGNOSIS — E162 Hypoglycemia, unspecified: Secondary | ICD-10-CM | POA: Diagnosis not present

## 2017-05-18 DIAGNOSIS — D631 Anemia in chronic kidney disease: Secondary | ICD-10-CM | POA: Diagnosis not present

## 2017-05-18 DIAGNOSIS — N186 End stage renal disease: Secondary | ICD-10-CM | POA: Diagnosis not present

## 2017-05-20 DIAGNOSIS — E1129 Type 2 diabetes mellitus with other diabetic kidney complication: Secondary | ICD-10-CM | POA: Diagnosis not present

## 2017-05-20 DIAGNOSIS — D631 Anemia in chronic kidney disease: Secondary | ICD-10-CM | POA: Diagnosis not present

## 2017-05-20 DIAGNOSIS — E162 Hypoglycemia, unspecified: Secondary | ICD-10-CM | POA: Diagnosis not present

## 2017-05-20 DIAGNOSIS — N186 End stage renal disease: Secondary | ICD-10-CM | POA: Diagnosis not present

## 2017-05-20 DIAGNOSIS — N2581 Secondary hyperparathyroidism of renal origin: Secondary | ICD-10-CM | POA: Diagnosis not present

## 2017-05-22 DIAGNOSIS — E162 Hypoglycemia, unspecified: Secondary | ICD-10-CM | POA: Diagnosis not present

## 2017-05-22 DIAGNOSIS — E1129 Type 2 diabetes mellitus with other diabetic kidney complication: Secondary | ICD-10-CM | POA: Diagnosis not present

## 2017-05-22 DIAGNOSIS — N186 End stage renal disease: Secondary | ICD-10-CM | POA: Diagnosis not present

## 2017-05-22 DIAGNOSIS — D631 Anemia in chronic kidney disease: Secondary | ICD-10-CM | POA: Diagnosis not present

## 2017-05-22 DIAGNOSIS — N2581 Secondary hyperparathyroidism of renal origin: Secondary | ICD-10-CM | POA: Diagnosis not present

## 2017-05-25 DIAGNOSIS — Z86010 Personal history of colonic polyps: Secondary | ICD-10-CM | POA: Diagnosis not present

## 2017-05-25 DIAGNOSIS — R1310 Dysphagia, unspecified: Secondary | ICD-10-CM | POA: Diagnosis not present

## 2017-05-25 DIAGNOSIS — E1129 Type 2 diabetes mellitus with other diabetic kidney complication: Secondary | ICD-10-CM | POA: Diagnosis not present

## 2017-05-25 DIAGNOSIS — D631 Anemia in chronic kidney disease: Secondary | ICD-10-CM | POA: Diagnosis not present

## 2017-05-25 DIAGNOSIS — E162 Hypoglycemia, unspecified: Secondary | ICD-10-CM | POA: Diagnosis not present

## 2017-05-25 DIAGNOSIS — N2581 Secondary hyperparathyroidism of renal origin: Secondary | ICD-10-CM | POA: Diagnosis not present

## 2017-05-25 DIAGNOSIS — N186 End stage renal disease: Secondary | ICD-10-CM | POA: Diagnosis not present

## 2017-05-25 DIAGNOSIS — R197 Diarrhea, unspecified: Secondary | ICD-10-CM | POA: Diagnosis not present

## 2017-05-27 DIAGNOSIS — E1129 Type 2 diabetes mellitus with other diabetic kidney complication: Secondary | ICD-10-CM | POA: Diagnosis not present

## 2017-05-27 DIAGNOSIS — N2581 Secondary hyperparathyroidism of renal origin: Secondary | ICD-10-CM | POA: Diagnosis not present

## 2017-05-27 DIAGNOSIS — E162 Hypoglycemia, unspecified: Secondary | ICD-10-CM | POA: Diagnosis not present

## 2017-05-27 DIAGNOSIS — D631 Anemia in chronic kidney disease: Secondary | ICD-10-CM | POA: Diagnosis not present

## 2017-05-27 DIAGNOSIS — N186 End stage renal disease: Secondary | ICD-10-CM | POA: Diagnosis not present

## 2017-05-29 DIAGNOSIS — E162 Hypoglycemia, unspecified: Secondary | ICD-10-CM | POA: Diagnosis not present

## 2017-05-29 DIAGNOSIS — N186 End stage renal disease: Secondary | ICD-10-CM | POA: Diagnosis not present

## 2017-05-29 DIAGNOSIS — D631 Anemia in chronic kidney disease: Secondary | ICD-10-CM | POA: Diagnosis not present

## 2017-05-29 DIAGNOSIS — N2581 Secondary hyperparathyroidism of renal origin: Secondary | ICD-10-CM | POA: Diagnosis not present

## 2017-05-29 DIAGNOSIS — E1129 Type 2 diabetes mellitus with other diabetic kidney complication: Secondary | ICD-10-CM | POA: Diagnosis not present

## 2017-06-01 DIAGNOSIS — N186 End stage renal disease: Secondary | ICD-10-CM | POA: Diagnosis not present

## 2017-06-01 DIAGNOSIS — D631 Anemia in chronic kidney disease: Secondary | ICD-10-CM | POA: Diagnosis not present

## 2017-06-01 DIAGNOSIS — E1129 Type 2 diabetes mellitus with other diabetic kidney complication: Secondary | ICD-10-CM | POA: Diagnosis not present

## 2017-06-01 DIAGNOSIS — E162 Hypoglycemia, unspecified: Secondary | ICD-10-CM | POA: Diagnosis not present

## 2017-06-01 DIAGNOSIS — N2581 Secondary hyperparathyroidism of renal origin: Secondary | ICD-10-CM | POA: Diagnosis not present

## 2017-06-03 DIAGNOSIS — R42 Dizziness and giddiness: Secondary | ICD-10-CM | POA: Diagnosis not present

## 2017-06-03 DIAGNOSIS — D631 Anemia in chronic kidney disease: Secondary | ICD-10-CM | POA: Diagnosis not present

## 2017-06-03 DIAGNOSIS — Z87891 Personal history of nicotine dependence: Secondary | ICD-10-CM | POA: Diagnosis not present

## 2017-06-03 DIAGNOSIS — Z89511 Acquired absence of right leg below knee: Secondary | ICD-10-CM | POA: Diagnosis not present

## 2017-06-03 DIAGNOSIS — G629 Polyneuropathy, unspecified: Secondary | ICD-10-CM | POA: Diagnosis not present

## 2017-06-03 DIAGNOSIS — E1129 Type 2 diabetes mellitus with other diabetic kidney complication: Secondary | ICD-10-CM | POA: Diagnosis not present

## 2017-06-03 DIAGNOSIS — I10 Essential (primary) hypertension: Secondary | ICD-10-CM | POA: Diagnosis not present

## 2017-06-03 DIAGNOSIS — M79661 Pain in right lower leg: Secondary | ICD-10-CM | POA: Diagnosis not present

## 2017-06-03 DIAGNOSIS — I120 Hypertensive chronic kidney disease with stage 5 chronic kidney disease or end stage renal disease: Secondary | ICD-10-CM | POA: Diagnosis not present

## 2017-06-03 DIAGNOSIS — N2581 Secondary hyperparathyroidism of renal origin: Secondary | ICD-10-CM | POA: Diagnosis not present

## 2017-06-03 DIAGNOSIS — G8929 Other chronic pain: Secondary | ICD-10-CM | POA: Diagnosis not present

## 2017-06-03 DIAGNOSIS — N186 End stage renal disease: Secondary | ICD-10-CM | POA: Diagnosis not present

## 2017-06-03 DIAGNOSIS — Z89512 Acquired absence of left leg below knee: Secondary | ICD-10-CM | POA: Diagnosis not present

## 2017-06-03 DIAGNOSIS — E162 Hypoglycemia, unspecified: Secondary | ICD-10-CM | POA: Diagnosis not present

## 2017-06-03 DIAGNOSIS — G9009 Other idiopathic peripheral autonomic neuropathy: Secondary | ICD-10-CM | POA: Diagnosis not present

## 2017-06-03 DIAGNOSIS — E1122 Type 2 diabetes mellitus with diabetic chronic kidney disease: Secondary | ICD-10-CM | POA: Diagnosis not present

## 2017-06-03 DIAGNOSIS — G894 Chronic pain syndrome: Secondary | ICD-10-CM | POA: Diagnosis not present

## 2017-06-03 DIAGNOSIS — M79662 Pain in left lower leg: Secondary | ICD-10-CM | POA: Diagnosis not present

## 2017-06-03 DIAGNOSIS — Z794 Long term (current) use of insulin: Secondary | ICD-10-CM | POA: Diagnosis not present

## 2017-06-03 DIAGNOSIS — Z992 Dependence on renal dialysis: Secondary | ICD-10-CM | POA: Diagnosis not present

## 2017-06-04 DIAGNOSIS — Z794 Long term (current) use of insulin: Secondary | ICD-10-CM | POA: Diagnosis not present

## 2017-06-04 DIAGNOSIS — Z7689 Persons encountering health services in other specified circumstances: Secondary | ICD-10-CM | POA: Diagnosis not present

## 2017-06-04 DIAGNOSIS — I10 Essential (primary) hypertension: Secondary | ICD-10-CM | POA: Diagnosis not present

## 2017-06-04 DIAGNOSIS — N186 End stage renal disease: Secondary | ICD-10-CM | POA: Diagnosis not present

## 2017-06-04 DIAGNOSIS — E1122 Type 2 diabetes mellitus with diabetic chronic kidney disease: Secondary | ICD-10-CM | POA: Diagnosis not present

## 2017-06-04 DIAGNOSIS — I120 Hypertensive chronic kidney disease with stage 5 chronic kidney disease or end stage renal disease: Secondary | ICD-10-CM | POA: Diagnosis not present

## 2017-06-04 DIAGNOSIS — E1151 Type 2 diabetes mellitus with diabetic peripheral angiopathy without gangrene: Secondary | ICD-10-CM | POA: Diagnosis not present

## 2017-06-04 DIAGNOSIS — Z720 Tobacco use: Secondary | ICD-10-CM | POA: Insufficient documentation

## 2017-06-04 DIAGNOSIS — Z992 Dependence on renal dialysis: Secondary | ICD-10-CM | POA: Diagnosis not present

## 2017-06-05 DIAGNOSIS — E162 Hypoglycemia, unspecified: Secondary | ICD-10-CM | POA: Diagnosis not present

## 2017-06-05 DIAGNOSIS — N2581 Secondary hyperparathyroidism of renal origin: Secondary | ICD-10-CM | POA: Diagnosis not present

## 2017-06-05 DIAGNOSIS — E1129 Type 2 diabetes mellitus with other diabetic kidney complication: Secondary | ICD-10-CM | POA: Diagnosis not present

## 2017-06-05 DIAGNOSIS — N186 End stage renal disease: Secondary | ICD-10-CM | POA: Diagnosis not present

## 2017-06-05 DIAGNOSIS — D631 Anemia in chronic kidney disease: Secondary | ICD-10-CM | POA: Diagnosis not present

## 2017-06-08 DIAGNOSIS — E162 Hypoglycemia, unspecified: Secondary | ICD-10-CM | POA: Diagnosis not present

## 2017-06-08 DIAGNOSIS — D631 Anemia in chronic kidney disease: Secondary | ICD-10-CM | POA: Diagnosis not present

## 2017-06-08 DIAGNOSIS — N2581 Secondary hyperparathyroidism of renal origin: Secondary | ICD-10-CM | POA: Diagnosis not present

## 2017-06-08 DIAGNOSIS — E1129 Type 2 diabetes mellitus with other diabetic kidney complication: Secondary | ICD-10-CM | POA: Diagnosis not present

## 2017-06-08 DIAGNOSIS — N186 End stage renal disease: Secondary | ICD-10-CM | POA: Diagnosis not present

## 2017-06-10 DIAGNOSIS — E1129 Type 2 diabetes mellitus with other diabetic kidney complication: Secondary | ICD-10-CM | POA: Diagnosis not present

## 2017-06-10 DIAGNOSIS — D631 Anemia in chronic kidney disease: Secondary | ICD-10-CM | POA: Diagnosis not present

## 2017-06-10 DIAGNOSIS — N2581 Secondary hyperparathyroidism of renal origin: Secondary | ICD-10-CM | POA: Diagnosis not present

## 2017-06-10 DIAGNOSIS — N186 End stage renal disease: Secondary | ICD-10-CM | POA: Diagnosis not present

## 2017-06-10 DIAGNOSIS — E162 Hypoglycemia, unspecified: Secondary | ICD-10-CM | POA: Diagnosis not present

## 2017-06-12 DIAGNOSIS — E162 Hypoglycemia, unspecified: Secondary | ICD-10-CM | POA: Diagnosis not present

## 2017-06-12 DIAGNOSIS — N186 End stage renal disease: Secondary | ICD-10-CM | POA: Diagnosis not present

## 2017-06-12 DIAGNOSIS — E1129 Type 2 diabetes mellitus with other diabetic kidney complication: Secondary | ICD-10-CM | POA: Diagnosis not present

## 2017-06-12 DIAGNOSIS — D631 Anemia in chronic kidney disease: Secondary | ICD-10-CM | POA: Diagnosis not present

## 2017-06-12 DIAGNOSIS — N2581 Secondary hyperparathyroidism of renal origin: Secondary | ICD-10-CM | POA: Diagnosis not present

## 2017-06-15 DIAGNOSIS — N186 End stage renal disease: Secondary | ICD-10-CM | POA: Diagnosis not present

## 2017-06-15 DIAGNOSIS — E162 Hypoglycemia, unspecified: Secondary | ICD-10-CM | POA: Diagnosis not present

## 2017-06-15 DIAGNOSIS — E1129 Type 2 diabetes mellitus with other diabetic kidney complication: Secondary | ICD-10-CM | POA: Diagnosis not present

## 2017-06-15 DIAGNOSIS — N2581 Secondary hyperparathyroidism of renal origin: Secondary | ICD-10-CM | POA: Diagnosis not present

## 2017-06-15 DIAGNOSIS — D631 Anemia in chronic kidney disease: Secondary | ICD-10-CM | POA: Diagnosis not present

## 2017-06-15 DIAGNOSIS — Z992 Dependence on renal dialysis: Secondary | ICD-10-CM | POA: Diagnosis not present

## 2017-06-17 DIAGNOSIS — D509 Iron deficiency anemia, unspecified: Secondary | ICD-10-CM | POA: Diagnosis not present

## 2017-06-17 DIAGNOSIS — D631 Anemia in chronic kidney disease: Secondary | ICD-10-CM | POA: Diagnosis not present

## 2017-06-17 DIAGNOSIS — N186 End stage renal disease: Secondary | ICD-10-CM | POA: Diagnosis not present

## 2017-06-17 DIAGNOSIS — E1129 Type 2 diabetes mellitus with other diabetic kidney complication: Secondary | ICD-10-CM | POA: Diagnosis not present

## 2017-06-17 DIAGNOSIS — N2581 Secondary hyperparathyroidism of renal origin: Secondary | ICD-10-CM | POA: Diagnosis not present

## 2017-06-18 DIAGNOSIS — R1310 Dysphagia, unspecified: Secondary | ICD-10-CM | POA: Diagnosis not present

## 2017-06-19 DIAGNOSIS — D631 Anemia in chronic kidney disease: Secondary | ICD-10-CM | POA: Diagnosis not present

## 2017-06-19 DIAGNOSIS — N186 End stage renal disease: Secondary | ICD-10-CM | POA: Diagnosis not present

## 2017-06-19 DIAGNOSIS — N2581 Secondary hyperparathyroidism of renal origin: Secondary | ICD-10-CM | POA: Diagnosis not present

## 2017-06-19 DIAGNOSIS — E1129 Type 2 diabetes mellitus with other diabetic kidney complication: Secondary | ICD-10-CM | POA: Diagnosis not present

## 2017-06-19 DIAGNOSIS — D509 Iron deficiency anemia, unspecified: Secondary | ICD-10-CM | POA: Diagnosis not present

## 2017-06-22 DIAGNOSIS — N186 End stage renal disease: Secondary | ICD-10-CM | POA: Diagnosis not present

## 2017-06-22 DIAGNOSIS — D509 Iron deficiency anemia, unspecified: Secondary | ICD-10-CM | POA: Diagnosis not present

## 2017-06-22 DIAGNOSIS — D631 Anemia in chronic kidney disease: Secondary | ICD-10-CM | POA: Diagnosis not present

## 2017-06-22 DIAGNOSIS — N2581 Secondary hyperparathyroidism of renal origin: Secondary | ICD-10-CM | POA: Diagnosis not present

## 2017-06-22 DIAGNOSIS — E1129 Type 2 diabetes mellitus with other diabetic kidney complication: Secondary | ICD-10-CM | POA: Diagnosis not present

## 2017-06-24 DIAGNOSIS — D631 Anemia in chronic kidney disease: Secondary | ICD-10-CM | POA: Diagnosis not present

## 2017-06-24 DIAGNOSIS — D509 Iron deficiency anemia, unspecified: Secondary | ICD-10-CM | POA: Diagnosis not present

## 2017-06-24 DIAGNOSIS — E1129 Type 2 diabetes mellitus with other diabetic kidney complication: Secondary | ICD-10-CM | POA: Diagnosis not present

## 2017-06-24 DIAGNOSIS — N2581 Secondary hyperparathyroidism of renal origin: Secondary | ICD-10-CM | POA: Diagnosis not present

## 2017-06-24 DIAGNOSIS — N186 End stage renal disease: Secondary | ICD-10-CM | POA: Diagnosis not present

## 2017-06-26 DIAGNOSIS — E1129 Type 2 diabetes mellitus with other diabetic kidney complication: Secondary | ICD-10-CM | POA: Diagnosis not present

## 2017-06-26 DIAGNOSIS — N186 End stage renal disease: Secondary | ICD-10-CM | POA: Diagnosis not present

## 2017-06-26 DIAGNOSIS — D509 Iron deficiency anemia, unspecified: Secondary | ICD-10-CM | POA: Diagnosis not present

## 2017-06-26 DIAGNOSIS — D631 Anemia in chronic kidney disease: Secondary | ICD-10-CM | POA: Diagnosis not present

## 2017-06-26 DIAGNOSIS — N2581 Secondary hyperparathyroidism of renal origin: Secondary | ICD-10-CM | POA: Diagnosis not present

## 2017-06-30 DIAGNOSIS — Z86010 Personal history of colonic polyps: Secondary | ICD-10-CM | POA: Diagnosis not present

## 2017-06-30 DIAGNOSIS — Z800 Family history of malignant neoplasm of digestive organs: Secondary | ICD-10-CM | POA: Diagnosis not present

## 2017-07-01 DIAGNOSIS — N186 End stage renal disease: Secondary | ICD-10-CM | POA: Diagnosis not present

## 2017-07-01 DIAGNOSIS — D631 Anemia in chronic kidney disease: Secondary | ICD-10-CM | POA: Diagnosis not present

## 2017-07-01 DIAGNOSIS — D509 Iron deficiency anemia, unspecified: Secondary | ICD-10-CM | POA: Diagnosis not present

## 2017-07-01 DIAGNOSIS — N2581 Secondary hyperparathyroidism of renal origin: Secondary | ICD-10-CM | POA: Diagnosis not present

## 2017-07-01 DIAGNOSIS — E1129 Type 2 diabetes mellitus with other diabetic kidney complication: Secondary | ICD-10-CM | POA: Diagnosis not present

## 2017-07-03 DIAGNOSIS — E1129 Type 2 diabetes mellitus with other diabetic kidney complication: Secondary | ICD-10-CM | POA: Diagnosis not present

## 2017-07-03 DIAGNOSIS — N2581 Secondary hyperparathyroidism of renal origin: Secondary | ICD-10-CM | POA: Diagnosis not present

## 2017-07-03 DIAGNOSIS — N186 End stage renal disease: Secondary | ICD-10-CM | POA: Diagnosis not present

## 2017-07-03 DIAGNOSIS — D631 Anemia in chronic kidney disease: Secondary | ICD-10-CM | POA: Diagnosis not present

## 2017-07-03 DIAGNOSIS — D509 Iron deficiency anemia, unspecified: Secondary | ICD-10-CM | POA: Diagnosis not present

## 2017-07-06 DIAGNOSIS — D509 Iron deficiency anemia, unspecified: Secondary | ICD-10-CM | POA: Diagnosis not present

## 2017-07-06 DIAGNOSIS — N186 End stage renal disease: Secondary | ICD-10-CM | POA: Diagnosis not present

## 2017-07-06 DIAGNOSIS — D631 Anemia in chronic kidney disease: Secondary | ICD-10-CM | POA: Diagnosis not present

## 2017-07-06 DIAGNOSIS — N2581 Secondary hyperparathyroidism of renal origin: Secondary | ICD-10-CM | POA: Diagnosis not present

## 2017-07-06 DIAGNOSIS — E1129 Type 2 diabetes mellitus with other diabetic kidney complication: Secondary | ICD-10-CM | POA: Diagnosis not present

## 2017-07-08 DIAGNOSIS — E1129 Type 2 diabetes mellitus with other diabetic kidney complication: Secondary | ICD-10-CM | POA: Diagnosis not present

## 2017-07-08 DIAGNOSIS — D631 Anemia in chronic kidney disease: Secondary | ICD-10-CM | POA: Diagnosis not present

## 2017-07-08 DIAGNOSIS — N2581 Secondary hyperparathyroidism of renal origin: Secondary | ICD-10-CM | POA: Diagnosis not present

## 2017-07-08 DIAGNOSIS — D509 Iron deficiency anemia, unspecified: Secondary | ICD-10-CM | POA: Diagnosis not present

## 2017-07-08 DIAGNOSIS — N186 End stage renal disease: Secondary | ICD-10-CM | POA: Diagnosis not present

## 2017-07-10 DIAGNOSIS — D631 Anemia in chronic kidney disease: Secondary | ICD-10-CM | POA: Diagnosis not present

## 2017-07-10 DIAGNOSIS — E1129 Type 2 diabetes mellitus with other diabetic kidney complication: Secondary | ICD-10-CM | POA: Diagnosis not present

## 2017-07-10 DIAGNOSIS — D509 Iron deficiency anemia, unspecified: Secondary | ICD-10-CM | POA: Diagnosis not present

## 2017-07-10 DIAGNOSIS — N2581 Secondary hyperparathyroidism of renal origin: Secondary | ICD-10-CM | POA: Diagnosis not present

## 2017-07-10 DIAGNOSIS — N186 End stage renal disease: Secondary | ICD-10-CM | POA: Diagnosis not present

## 2017-07-13 DIAGNOSIS — D631 Anemia in chronic kidney disease: Secondary | ICD-10-CM | POA: Diagnosis not present

## 2017-07-13 DIAGNOSIS — N186 End stage renal disease: Secondary | ICD-10-CM | POA: Diagnosis not present

## 2017-07-13 DIAGNOSIS — N2581 Secondary hyperparathyroidism of renal origin: Secondary | ICD-10-CM | POA: Diagnosis not present

## 2017-07-13 DIAGNOSIS — D509 Iron deficiency anemia, unspecified: Secondary | ICD-10-CM | POA: Diagnosis not present

## 2017-07-13 DIAGNOSIS — E1129 Type 2 diabetes mellitus with other diabetic kidney complication: Secondary | ICD-10-CM | POA: Diagnosis not present

## 2017-07-14 DIAGNOSIS — I10 Essential (primary) hypertension: Secondary | ICD-10-CM | POA: Diagnosis not present

## 2017-07-14 DIAGNOSIS — Z89511 Acquired absence of right leg below knee: Secondary | ICD-10-CM | POA: Diagnosis not present

## 2017-07-14 DIAGNOSIS — Z794 Long term (current) use of insulin: Secondary | ICD-10-CM | POA: Diagnosis not present

## 2017-07-14 DIAGNOSIS — E782 Mixed hyperlipidemia: Secondary | ICD-10-CM | POA: Diagnosis not present

## 2017-07-14 DIAGNOSIS — D696 Thrombocytopenia, unspecified: Secondary | ICD-10-CM | POA: Diagnosis not present

## 2017-07-14 DIAGNOSIS — E43 Unspecified severe protein-calorie malnutrition: Secondary | ICD-10-CM | POA: Diagnosis not present

## 2017-07-14 DIAGNOSIS — E118 Type 2 diabetes mellitus with unspecified complications: Secondary | ICD-10-CM | POA: Diagnosis not present

## 2017-07-14 DIAGNOSIS — Z89512 Acquired absence of left leg below knee: Secondary | ICD-10-CM | POA: Diagnosis not present

## 2017-07-14 DIAGNOSIS — G546 Phantom limb syndrome with pain: Secondary | ICD-10-CM | POA: Diagnosis not present

## 2017-07-15 DIAGNOSIS — N2581 Secondary hyperparathyroidism of renal origin: Secondary | ICD-10-CM | POA: Diagnosis not present

## 2017-07-15 DIAGNOSIS — E1129 Type 2 diabetes mellitus with other diabetic kidney complication: Secondary | ICD-10-CM | POA: Diagnosis not present

## 2017-07-15 DIAGNOSIS — D631 Anemia in chronic kidney disease: Secondary | ICD-10-CM | POA: Diagnosis not present

## 2017-07-15 DIAGNOSIS — N186 End stage renal disease: Secondary | ICD-10-CM | POA: Diagnosis not present

## 2017-07-15 DIAGNOSIS — D509 Iron deficiency anemia, unspecified: Secondary | ICD-10-CM | POA: Diagnosis not present

## 2017-07-16 DIAGNOSIS — N186 End stage renal disease: Secondary | ICD-10-CM | POA: Diagnosis not present

## 2017-07-16 DIAGNOSIS — Z992 Dependence on renal dialysis: Secondary | ICD-10-CM | POA: Diagnosis not present

## 2017-07-16 DIAGNOSIS — E1129 Type 2 diabetes mellitus with other diabetic kidney complication: Secondary | ICD-10-CM | POA: Diagnosis not present

## 2017-07-17 DIAGNOSIS — D631 Anemia in chronic kidney disease: Secondary | ICD-10-CM | POA: Diagnosis not present

## 2017-07-17 DIAGNOSIS — N186 End stage renal disease: Secondary | ICD-10-CM | POA: Diagnosis not present

## 2017-07-17 DIAGNOSIS — E162 Hypoglycemia, unspecified: Secondary | ICD-10-CM | POA: Diagnosis not present

## 2017-07-17 DIAGNOSIS — N2581 Secondary hyperparathyroidism of renal origin: Secondary | ICD-10-CM | POA: Diagnosis not present

## 2017-07-17 DIAGNOSIS — E1129 Type 2 diabetes mellitus with other diabetic kidney complication: Secondary | ICD-10-CM | POA: Diagnosis not present

## 2017-07-20 DIAGNOSIS — N2581 Secondary hyperparathyroidism of renal origin: Secondary | ICD-10-CM | POA: Diagnosis not present

## 2017-07-20 DIAGNOSIS — N186 End stage renal disease: Secondary | ICD-10-CM | POA: Diagnosis not present

## 2017-07-20 DIAGNOSIS — D631 Anemia in chronic kidney disease: Secondary | ICD-10-CM | POA: Diagnosis not present

## 2017-07-20 DIAGNOSIS — E1129 Type 2 diabetes mellitus with other diabetic kidney complication: Secondary | ICD-10-CM | POA: Diagnosis not present

## 2017-07-20 DIAGNOSIS — E162 Hypoglycemia, unspecified: Secondary | ICD-10-CM | POA: Diagnosis not present

## 2017-07-22 DIAGNOSIS — D631 Anemia in chronic kidney disease: Secondary | ICD-10-CM | POA: Diagnosis not present

## 2017-07-22 DIAGNOSIS — N2581 Secondary hyperparathyroidism of renal origin: Secondary | ICD-10-CM | POA: Diagnosis not present

## 2017-07-22 DIAGNOSIS — E1129 Type 2 diabetes mellitus with other diabetic kidney complication: Secondary | ICD-10-CM | POA: Diagnosis not present

## 2017-07-22 DIAGNOSIS — E162 Hypoglycemia, unspecified: Secondary | ICD-10-CM | POA: Diagnosis not present

## 2017-07-22 DIAGNOSIS — N186 End stage renal disease: Secondary | ICD-10-CM | POA: Diagnosis not present

## 2017-07-24 DIAGNOSIS — D631 Anemia in chronic kidney disease: Secondary | ICD-10-CM | POA: Diagnosis not present

## 2017-07-24 DIAGNOSIS — E162 Hypoglycemia, unspecified: Secondary | ICD-10-CM | POA: Diagnosis not present

## 2017-07-24 DIAGNOSIS — N2581 Secondary hyperparathyroidism of renal origin: Secondary | ICD-10-CM | POA: Diagnosis not present

## 2017-07-24 DIAGNOSIS — E1129 Type 2 diabetes mellitus with other diabetic kidney complication: Secondary | ICD-10-CM | POA: Diagnosis not present

## 2017-07-24 DIAGNOSIS — N186 End stage renal disease: Secondary | ICD-10-CM | POA: Diagnosis not present

## 2017-07-27 DIAGNOSIS — E162 Hypoglycemia, unspecified: Secondary | ICD-10-CM | POA: Diagnosis not present

## 2017-07-27 DIAGNOSIS — N2581 Secondary hyperparathyroidism of renal origin: Secondary | ICD-10-CM | POA: Diagnosis not present

## 2017-07-27 DIAGNOSIS — E1129 Type 2 diabetes mellitus with other diabetic kidney complication: Secondary | ICD-10-CM | POA: Diagnosis not present

## 2017-07-27 DIAGNOSIS — N186 End stage renal disease: Secondary | ICD-10-CM | POA: Diagnosis not present

## 2017-07-27 DIAGNOSIS — D631 Anemia in chronic kidney disease: Secondary | ICD-10-CM | POA: Diagnosis not present

## 2017-07-29 DIAGNOSIS — E1129 Type 2 diabetes mellitus with other diabetic kidney complication: Secondary | ICD-10-CM | POA: Diagnosis not present

## 2017-07-29 DIAGNOSIS — N2581 Secondary hyperparathyroidism of renal origin: Secondary | ICD-10-CM | POA: Diagnosis not present

## 2017-07-29 DIAGNOSIS — E162 Hypoglycemia, unspecified: Secondary | ICD-10-CM | POA: Diagnosis not present

## 2017-07-29 DIAGNOSIS — N186 End stage renal disease: Secondary | ICD-10-CM | POA: Diagnosis not present

## 2017-07-29 DIAGNOSIS — D631 Anemia in chronic kidney disease: Secondary | ICD-10-CM | POA: Diagnosis not present

## 2017-07-31 DIAGNOSIS — N2581 Secondary hyperparathyroidism of renal origin: Secondary | ICD-10-CM | POA: Diagnosis not present

## 2017-07-31 DIAGNOSIS — D631 Anemia in chronic kidney disease: Secondary | ICD-10-CM | POA: Diagnosis not present

## 2017-07-31 DIAGNOSIS — E162 Hypoglycemia, unspecified: Secondary | ICD-10-CM | POA: Diagnosis not present

## 2017-07-31 DIAGNOSIS — E1129 Type 2 diabetes mellitus with other diabetic kidney complication: Secondary | ICD-10-CM | POA: Diagnosis not present

## 2017-07-31 DIAGNOSIS — N186 End stage renal disease: Secondary | ICD-10-CM | POA: Diagnosis not present

## 2017-08-03 DIAGNOSIS — D631 Anemia in chronic kidney disease: Secondary | ICD-10-CM | POA: Diagnosis not present

## 2017-08-03 DIAGNOSIS — N186 End stage renal disease: Secondary | ICD-10-CM | POA: Diagnosis not present

## 2017-08-03 DIAGNOSIS — E1129 Type 2 diabetes mellitus with other diabetic kidney complication: Secondary | ICD-10-CM | POA: Diagnosis not present

## 2017-08-03 DIAGNOSIS — N2581 Secondary hyperparathyroidism of renal origin: Secondary | ICD-10-CM | POA: Diagnosis not present

## 2017-08-03 DIAGNOSIS — E162 Hypoglycemia, unspecified: Secondary | ICD-10-CM | POA: Diagnosis not present

## 2017-08-05 DIAGNOSIS — N186 End stage renal disease: Secondary | ICD-10-CM | POA: Diagnosis not present

## 2017-08-05 DIAGNOSIS — E162 Hypoglycemia, unspecified: Secondary | ICD-10-CM | POA: Diagnosis not present

## 2017-08-05 DIAGNOSIS — N2581 Secondary hyperparathyroidism of renal origin: Secondary | ICD-10-CM | POA: Diagnosis not present

## 2017-08-05 DIAGNOSIS — E1129 Type 2 diabetes mellitus with other diabetic kidney complication: Secondary | ICD-10-CM | POA: Diagnosis not present

## 2017-08-05 DIAGNOSIS — D631 Anemia in chronic kidney disease: Secondary | ICD-10-CM | POA: Diagnosis not present

## 2017-08-07 DIAGNOSIS — N186 End stage renal disease: Secondary | ICD-10-CM | POA: Diagnosis not present

## 2017-08-07 DIAGNOSIS — E162 Hypoglycemia, unspecified: Secondary | ICD-10-CM | POA: Diagnosis not present

## 2017-08-07 DIAGNOSIS — N2581 Secondary hyperparathyroidism of renal origin: Secondary | ICD-10-CM | POA: Diagnosis not present

## 2017-08-07 DIAGNOSIS — E1129 Type 2 diabetes mellitus with other diabetic kidney complication: Secondary | ICD-10-CM | POA: Diagnosis not present

## 2017-08-07 DIAGNOSIS — D631 Anemia in chronic kidney disease: Secondary | ICD-10-CM | POA: Diagnosis not present

## 2017-08-10 DIAGNOSIS — N2581 Secondary hyperparathyroidism of renal origin: Secondary | ICD-10-CM | POA: Diagnosis not present

## 2017-08-10 DIAGNOSIS — E162 Hypoglycemia, unspecified: Secondary | ICD-10-CM | POA: Diagnosis not present

## 2017-08-10 DIAGNOSIS — D631 Anemia in chronic kidney disease: Secondary | ICD-10-CM | POA: Diagnosis not present

## 2017-08-10 DIAGNOSIS — N186 End stage renal disease: Secondary | ICD-10-CM | POA: Diagnosis not present

## 2017-08-10 DIAGNOSIS — E1129 Type 2 diabetes mellitus with other diabetic kidney complication: Secondary | ICD-10-CM | POA: Diagnosis not present

## 2017-08-12 DIAGNOSIS — E162 Hypoglycemia, unspecified: Secondary | ICD-10-CM | POA: Diagnosis not present

## 2017-08-12 DIAGNOSIS — D631 Anemia in chronic kidney disease: Secondary | ICD-10-CM | POA: Diagnosis not present

## 2017-08-12 DIAGNOSIS — N186 End stage renal disease: Secondary | ICD-10-CM | POA: Diagnosis not present

## 2017-08-12 DIAGNOSIS — E1129 Type 2 diabetes mellitus with other diabetic kidney complication: Secondary | ICD-10-CM | POA: Diagnosis not present

## 2017-08-12 DIAGNOSIS — N2581 Secondary hyperparathyroidism of renal origin: Secondary | ICD-10-CM | POA: Diagnosis not present

## 2017-08-14 DIAGNOSIS — E162 Hypoglycemia, unspecified: Secondary | ICD-10-CM | POA: Diagnosis not present

## 2017-08-14 DIAGNOSIS — D631 Anemia in chronic kidney disease: Secondary | ICD-10-CM | POA: Diagnosis not present

## 2017-08-14 DIAGNOSIS — N186 End stage renal disease: Secondary | ICD-10-CM | POA: Diagnosis not present

## 2017-08-14 DIAGNOSIS — N2581 Secondary hyperparathyroidism of renal origin: Secondary | ICD-10-CM | POA: Diagnosis not present

## 2017-08-14 DIAGNOSIS — E1129 Type 2 diabetes mellitus with other diabetic kidney complication: Secondary | ICD-10-CM | POA: Diagnosis not present

## 2017-08-15 DIAGNOSIS — E1129 Type 2 diabetes mellitus with other diabetic kidney complication: Secondary | ICD-10-CM | POA: Diagnosis not present

## 2017-08-15 DIAGNOSIS — Z992 Dependence on renal dialysis: Secondary | ICD-10-CM | POA: Diagnosis not present

## 2017-08-15 DIAGNOSIS — N186 End stage renal disease: Secondary | ICD-10-CM | POA: Diagnosis not present

## 2017-08-17 DIAGNOSIS — N186 End stage renal disease: Secondary | ICD-10-CM | POA: Diagnosis not present

## 2017-08-17 DIAGNOSIS — Z23 Encounter for immunization: Secondary | ICD-10-CM | POA: Diagnosis not present

## 2017-08-17 DIAGNOSIS — E1129 Type 2 diabetes mellitus with other diabetic kidney complication: Secondary | ICD-10-CM | POA: Diagnosis not present

## 2017-08-17 DIAGNOSIS — N2581 Secondary hyperparathyroidism of renal origin: Secondary | ICD-10-CM | POA: Diagnosis not present

## 2017-08-17 DIAGNOSIS — D509 Iron deficiency anemia, unspecified: Secondary | ICD-10-CM | POA: Diagnosis not present

## 2017-08-19 DIAGNOSIS — N2581 Secondary hyperparathyroidism of renal origin: Secondary | ICD-10-CM | POA: Diagnosis not present

## 2017-08-19 DIAGNOSIS — N186 End stage renal disease: Secondary | ICD-10-CM | POA: Diagnosis not present

## 2017-08-19 DIAGNOSIS — E1129 Type 2 diabetes mellitus with other diabetic kidney complication: Secondary | ICD-10-CM | POA: Diagnosis not present

## 2017-08-19 DIAGNOSIS — D509 Iron deficiency anemia, unspecified: Secondary | ICD-10-CM | POA: Diagnosis not present

## 2017-08-19 DIAGNOSIS — Z23 Encounter for immunization: Secondary | ICD-10-CM | POA: Diagnosis not present

## 2017-08-21 DIAGNOSIS — N186 End stage renal disease: Secondary | ICD-10-CM | POA: Diagnosis not present

## 2017-08-21 DIAGNOSIS — N2581 Secondary hyperparathyroidism of renal origin: Secondary | ICD-10-CM | POA: Diagnosis not present

## 2017-08-21 DIAGNOSIS — E1129 Type 2 diabetes mellitus with other diabetic kidney complication: Secondary | ICD-10-CM | POA: Diagnosis not present

## 2017-08-21 DIAGNOSIS — D509 Iron deficiency anemia, unspecified: Secondary | ICD-10-CM | POA: Diagnosis not present

## 2017-08-21 DIAGNOSIS — Z23 Encounter for immunization: Secondary | ICD-10-CM | POA: Diagnosis not present

## 2017-08-24 DIAGNOSIS — D509 Iron deficiency anemia, unspecified: Secondary | ICD-10-CM | POA: Diagnosis not present

## 2017-08-24 DIAGNOSIS — N2581 Secondary hyperparathyroidism of renal origin: Secondary | ICD-10-CM | POA: Diagnosis not present

## 2017-08-24 DIAGNOSIS — N186 End stage renal disease: Secondary | ICD-10-CM | POA: Diagnosis not present

## 2017-08-24 DIAGNOSIS — Z23 Encounter for immunization: Secondary | ICD-10-CM | POA: Diagnosis not present

## 2017-08-24 DIAGNOSIS — E1129 Type 2 diabetes mellitus with other diabetic kidney complication: Secondary | ICD-10-CM | POA: Diagnosis not present

## 2017-08-26 DIAGNOSIS — Z23 Encounter for immunization: Secondary | ICD-10-CM | POA: Diagnosis not present

## 2017-08-26 DIAGNOSIS — N186 End stage renal disease: Secondary | ICD-10-CM | POA: Diagnosis not present

## 2017-08-26 DIAGNOSIS — E1129 Type 2 diabetes mellitus with other diabetic kidney complication: Secondary | ICD-10-CM | POA: Diagnosis not present

## 2017-08-26 DIAGNOSIS — N2581 Secondary hyperparathyroidism of renal origin: Secondary | ICD-10-CM | POA: Diagnosis not present

## 2017-08-26 DIAGNOSIS — D509 Iron deficiency anemia, unspecified: Secondary | ICD-10-CM | POA: Diagnosis not present

## 2017-08-28 DIAGNOSIS — D509 Iron deficiency anemia, unspecified: Secondary | ICD-10-CM | POA: Diagnosis not present

## 2017-08-28 DIAGNOSIS — N186 End stage renal disease: Secondary | ICD-10-CM | POA: Diagnosis not present

## 2017-08-28 DIAGNOSIS — Z23 Encounter for immunization: Secondary | ICD-10-CM | POA: Diagnosis not present

## 2017-08-28 DIAGNOSIS — E1129 Type 2 diabetes mellitus with other diabetic kidney complication: Secondary | ICD-10-CM | POA: Diagnosis not present

## 2017-08-28 DIAGNOSIS — N2581 Secondary hyperparathyroidism of renal origin: Secondary | ICD-10-CM | POA: Diagnosis not present

## 2017-08-31 DIAGNOSIS — N186 End stage renal disease: Secondary | ICD-10-CM | POA: Diagnosis not present

## 2017-08-31 DIAGNOSIS — D509 Iron deficiency anemia, unspecified: Secondary | ICD-10-CM | POA: Diagnosis not present

## 2017-08-31 DIAGNOSIS — Z23 Encounter for immunization: Secondary | ICD-10-CM | POA: Diagnosis not present

## 2017-08-31 DIAGNOSIS — N2581 Secondary hyperparathyroidism of renal origin: Secondary | ICD-10-CM | POA: Diagnosis not present

## 2017-08-31 DIAGNOSIS — E1129 Type 2 diabetes mellitus with other diabetic kidney complication: Secondary | ICD-10-CM | POA: Diagnosis not present

## 2017-09-02 DIAGNOSIS — D509 Iron deficiency anemia, unspecified: Secondary | ICD-10-CM | POA: Diagnosis not present

## 2017-09-02 DIAGNOSIS — E1129 Type 2 diabetes mellitus with other diabetic kidney complication: Secondary | ICD-10-CM | POA: Diagnosis not present

## 2017-09-02 DIAGNOSIS — Z23 Encounter for immunization: Secondary | ICD-10-CM | POA: Diagnosis not present

## 2017-09-02 DIAGNOSIS — N2581 Secondary hyperparathyroidism of renal origin: Secondary | ICD-10-CM | POA: Diagnosis not present

## 2017-09-02 DIAGNOSIS — N186 End stage renal disease: Secondary | ICD-10-CM | POA: Diagnosis not present

## 2017-09-04 DIAGNOSIS — D509 Iron deficiency anemia, unspecified: Secondary | ICD-10-CM | POA: Diagnosis not present

## 2017-09-04 DIAGNOSIS — N2581 Secondary hyperparathyroidism of renal origin: Secondary | ICD-10-CM | POA: Diagnosis not present

## 2017-09-04 DIAGNOSIS — N186 End stage renal disease: Secondary | ICD-10-CM | POA: Diagnosis not present

## 2017-09-04 DIAGNOSIS — Z23 Encounter for immunization: Secondary | ICD-10-CM | POA: Diagnosis not present

## 2017-09-04 DIAGNOSIS — E1129 Type 2 diabetes mellitus with other diabetic kidney complication: Secondary | ICD-10-CM | POA: Diagnosis not present

## 2017-09-07 DIAGNOSIS — Z23 Encounter for immunization: Secondary | ICD-10-CM | POA: Diagnosis not present

## 2017-09-07 DIAGNOSIS — E1129 Type 2 diabetes mellitus with other diabetic kidney complication: Secondary | ICD-10-CM | POA: Diagnosis not present

## 2017-09-07 DIAGNOSIS — N186 End stage renal disease: Secondary | ICD-10-CM | POA: Diagnosis not present

## 2017-09-07 DIAGNOSIS — D509 Iron deficiency anemia, unspecified: Secondary | ICD-10-CM | POA: Diagnosis not present

## 2017-09-07 DIAGNOSIS — N2581 Secondary hyperparathyroidism of renal origin: Secondary | ICD-10-CM | POA: Diagnosis not present

## 2017-09-09 DIAGNOSIS — N186 End stage renal disease: Secondary | ICD-10-CM | POA: Diagnosis not present

## 2017-09-09 DIAGNOSIS — D509 Iron deficiency anemia, unspecified: Secondary | ICD-10-CM | POA: Diagnosis not present

## 2017-09-09 DIAGNOSIS — Z23 Encounter for immunization: Secondary | ICD-10-CM | POA: Diagnosis not present

## 2017-09-09 DIAGNOSIS — E1129 Type 2 diabetes mellitus with other diabetic kidney complication: Secondary | ICD-10-CM | POA: Diagnosis not present

## 2017-09-09 DIAGNOSIS — N2581 Secondary hyperparathyroidism of renal origin: Secondary | ICD-10-CM | POA: Diagnosis not present

## 2017-09-11 DIAGNOSIS — Z23 Encounter for immunization: Secondary | ICD-10-CM | POA: Diagnosis not present

## 2017-09-11 DIAGNOSIS — N186 End stage renal disease: Secondary | ICD-10-CM | POA: Diagnosis not present

## 2017-09-11 DIAGNOSIS — D509 Iron deficiency anemia, unspecified: Secondary | ICD-10-CM | POA: Diagnosis not present

## 2017-09-11 DIAGNOSIS — E1129 Type 2 diabetes mellitus with other diabetic kidney complication: Secondary | ICD-10-CM | POA: Diagnosis not present

## 2017-09-11 DIAGNOSIS — N2581 Secondary hyperparathyroidism of renal origin: Secondary | ICD-10-CM | POA: Diagnosis not present

## 2017-09-14 DIAGNOSIS — N186 End stage renal disease: Secondary | ICD-10-CM | POA: Diagnosis not present

## 2017-09-14 DIAGNOSIS — D509 Iron deficiency anemia, unspecified: Secondary | ICD-10-CM | POA: Diagnosis not present

## 2017-09-14 DIAGNOSIS — Z23 Encounter for immunization: Secondary | ICD-10-CM | POA: Diagnosis not present

## 2017-09-14 DIAGNOSIS — E1129 Type 2 diabetes mellitus with other diabetic kidney complication: Secondary | ICD-10-CM | POA: Diagnosis not present

## 2017-09-14 DIAGNOSIS — N2581 Secondary hyperparathyroidism of renal origin: Secondary | ICD-10-CM | POA: Diagnosis not present

## 2017-09-15 DIAGNOSIS — Z992 Dependence on renal dialysis: Secondary | ICD-10-CM | POA: Diagnosis not present

## 2017-09-15 DIAGNOSIS — E1129 Type 2 diabetes mellitus with other diabetic kidney complication: Secondary | ICD-10-CM | POA: Diagnosis not present

## 2017-09-15 DIAGNOSIS — N186 End stage renal disease: Secondary | ICD-10-CM | POA: Diagnosis not present

## 2017-09-16 DIAGNOSIS — N186 End stage renal disease: Secondary | ICD-10-CM | POA: Diagnosis not present

## 2017-09-16 DIAGNOSIS — D631 Anemia in chronic kidney disease: Secondary | ICD-10-CM | POA: Diagnosis not present

## 2017-09-16 DIAGNOSIS — E1129 Type 2 diabetes mellitus with other diabetic kidney complication: Secondary | ICD-10-CM | POA: Diagnosis not present

## 2017-09-16 DIAGNOSIS — N2581 Secondary hyperparathyroidism of renal origin: Secondary | ICD-10-CM | POA: Diagnosis not present

## 2017-09-18 DIAGNOSIS — E1129 Type 2 diabetes mellitus with other diabetic kidney complication: Secondary | ICD-10-CM | POA: Diagnosis not present

## 2017-09-18 DIAGNOSIS — N2581 Secondary hyperparathyroidism of renal origin: Secondary | ICD-10-CM | POA: Diagnosis not present

## 2017-09-18 DIAGNOSIS — D631 Anemia in chronic kidney disease: Secondary | ICD-10-CM | POA: Diagnosis not present

## 2017-09-18 DIAGNOSIS — N186 End stage renal disease: Secondary | ICD-10-CM | POA: Diagnosis not present

## 2017-09-21 DIAGNOSIS — N186 End stage renal disease: Secondary | ICD-10-CM | POA: Diagnosis not present

## 2017-09-21 DIAGNOSIS — E1129 Type 2 diabetes mellitus with other diabetic kidney complication: Secondary | ICD-10-CM | POA: Diagnosis not present

## 2017-09-21 DIAGNOSIS — N2581 Secondary hyperparathyroidism of renal origin: Secondary | ICD-10-CM | POA: Diagnosis not present

## 2017-09-21 DIAGNOSIS — D631 Anemia in chronic kidney disease: Secondary | ICD-10-CM | POA: Diagnosis not present

## 2017-09-25 DIAGNOSIS — D631 Anemia in chronic kidney disease: Secondary | ICD-10-CM | POA: Diagnosis not present

## 2017-09-25 DIAGNOSIS — E1129 Type 2 diabetes mellitus with other diabetic kidney complication: Secondary | ICD-10-CM | POA: Diagnosis not present

## 2017-09-25 DIAGNOSIS — N186 End stage renal disease: Secondary | ICD-10-CM | POA: Diagnosis not present

## 2017-09-25 DIAGNOSIS — N2581 Secondary hyperparathyroidism of renal origin: Secondary | ICD-10-CM | POA: Diagnosis not present

## 2017-09-28 DIAGNOSIS — N186 End stage renal disease: Secondary | ICD-10-CM | POA: Diagnosis not present

## 2017-09-28 DIAGNOSIS — D631 Anemia in chronic kidney disease: Secondary | ICD-10-CM | POA: Diagnosis not present

## 2017-09-28 DIAGNOSIS — E1129 Type 2 diabetes mellitus with other diabetic kidney complication: Secondary | ICD-10-CM | POA: Diagnosis not present

## 2017-09-28 DIAGNOSIS — N2581 Secondary hyperparathyroidism of renal origin: Secondary | ICD-10-CM | POA: Diagnosis not present

## 2017-09-30 DIAGNOSIS — D631 Anemia in chronic kidney disease: Secondary | ICD-10-CM | POA: Diagnosis not present

## 2017-09-30 DIAGNOSIS — N2581 Secondary hyperparathyroidism of renal origin: Secondary | ICD-10-CM | POA: Diagnosis not present

## 2017-09-30 DIAGNOSIS — E1129 Type 2 diabetes mellitus with other diabetic kidney complication: Secondary | ICD-10-CM | POA: Diagnosis not present

## 2017-09-30 DIAGNOSIS — N186 End stage renal disease: Secondary | ICD-10-CM | POA: Diagnosis not present

## 2017-10-02 DIAGNOSIS — N2581 Secondary hyperparathyroidism of renal origin: Secondary | ICD-10-CM | POA: Diagnosis not present

## 2017-10-02 DIAGNOSIS — E1129 Type 2 diabetes mellitus with other diabetic kidney complication: Secondary | ICD-10-CM | POA: Diagnosis not present

## 2017-10-02 DIAGNOSIS — D631 Anemia in chronic kidney disease: Secondary | ICD-10-CM | POA: Diagnosis not present

## 2017-10-02 DIAGNOSIS — N186 End stage renal disease: Secondary | ICD-10-CM | POA: Diagnosis not present

## 2017-10-05 DIAGNOSIS — D631 Anemia in chronic kidney disease: Secondary | ICD-10-CM | POA: Diagnosis not present

## 2017-10-05 DIAGNOSIS — E1129 Type 2 diabetes mellitus with other diabetic kidney complication: Secondary | ICD-10-CM | POA: Diagnosis not present

## 2017-10-05 DIAGNOSIS — N2581 Secondary hyperparathyroidism of renal origin: Secondary | ICD-10-CM | POA: Diagnosis not present

## 2017-10-05 DIAGNOSIS — N186 End stage renal disease: Secondary | ICD-10-CM | POA: Diagnosis not present

## 2017-10-07 DIAGNOSIS — D631 Anemia in chronic kidney disease: Secondary | ICD-10-CM | POA: Diagnosis not present

## 2017-10-07 DIAGNOSIS — N2581 Secondary hyperparathyroidism of renal origin: Secondary | ICD-10-CM | POA: Diagnosis not present

## 2017-10-07 DIAGNOSIS — N186 End stage renal disease: Secondary | ICD-10-CM | POA: Diagnosis not present

## 2017-10-07 DIAGNOSIS — E1129 Type 2 diabetes mellitus with other diabetic kidney complication: Secondary | ICD-10-CM | POA: Diagnosis not present

## 2017-10-09 DIAGNOSIS — N2581 Secondary hyperparathyroidism of renal origin: Secondary | ICD-10-CM | POA: Diagnosis not present

## 2017-10-09 DIAGNOSIS — D631 Anemia in chronic kidney disease: Secondary | ICD-10-CM | POA: Diagnosis not present

## 2017-10-09 DIAGNOSIS — N186 End stage renal disease: Secondary | ICD-10-CM | POA: Diagnosis not present

## 2017-10-09 DIAGNOSIS — E1129 Type 2 diabetes mellitus with other diabetic kidney complication: Secondary | ICD-10-CM | POA: Diagnosis not present

## 2017-10-12 DIAGNOSIS — D631 Anemia in chronic kidney disease: Secondary | ICD-10-CM | POA: Diagnosis not present

## 2017-10-12 DIAGNOSIS — N2581 Secondary hyperparathyroidism of renal origin: Secondary | ICD-10-CM | POA: Diagnosis not present

## 2017-10-12 DIAGNOSIS — E1129 Type 2 diabetes mellitus with other diabetic kidney complication: Secondary | ICD-10-CM | POA: Diagnosis not present

## 2017-10-12 DIAGNOSIS — N186 End stage renal disease: Secondary | ICD-10-CM | POA: Diagnosis not present

## 2017-10-14 DIAGNOSIS — N186 End stage renal disease: Secondary | ICD-10-CM | POA: Diagnosis not present

## 2017-10-14 DIAGNOSIS — N2581 Secondary hyperparathyroidism of renal origin: Secondary | ICD-10-CM | POA: Diagnosis not present

## 2017-10-14 DIAGNOSIS — E1129 Type 2 diabetes mellitus with other diabetic kidney complication: Secondary | ICD-10-CM | POA: Diagnosis not present

## 2017-10-14 DIAGNOSIS — D631 Anemia in chronic kidney disease: Secondary | ICD-10-CM | POA: Diagnosis not present

## 2017-10-15 DIAGNOSIS — Z992 Dependence on renal dialysis: Secondary | ICD-10-CM | POA: Diagnosis not present

## 2017-10-15 DIAGNOSIS — N186 End stage renal disease: Secondary | ICD-10-CM | POA: Diagnosis not present

## 2017-10-15 DIAGNOSIS — E1129 Type 2 diabetes mellitus with other diabetic kidney complication: Secondary | ICD-10-CM | POA: Diagnosis not present

## 2017-10-16 DIAGNOSIS — E1129 Type 2 diabetes mellitus with other diabetic kidney complication: Secondary | ICD-10-CM | POA: Diagnosis not present

## 2017-10-16 DIAGNOSIS — D509 Iron deficiency anemia, unspecified: Secondary | ICD-10-CM | POA: Diagnosis not present

## 2017-10-16 DIAGNOSIS — D631 Anemia in chronic kidney disease: Secondary | ICD-10-CM | POA: Diagnosis not present

## 2017-10-16 DIAGNOSIS — N186 End stage renal disease: Secondary | ICD-10-CM | POA: Diagnosis not present

## 2017-10-16 DIAGNOSIS — N2581 Secondary hyperparathyroidism of renal origin: Secondary | ICD-10-CM | POA: Diagnosis not present

## 2017-10-19 DIAGNOSIS — E1129 Type 2 diabetes mellitus with other diabetic kidney complication: Secondary | ICD-10-CM | POA: Diagnosis not present

## 2017-10-19 DIAGNOSIS — N2581 Secondary hyperparathyroidism of renal origin: Secondary | ICD-10-CM | POA: Diagnosis not present

## 2017-10-19 DIAGNOSIS — D509 Iron deficiency anemia, unspecified: Secondary | ICD-10-CM | POA: Diagnosis not present

## 2017-10-19 DIAGNOSIS — N186 End stage renal disease: Secondary | ICD-10-CM | POA: Diagnosis not present

## 2017-10-19 DIAGNOSIS — D631 Anemia in chronic kidney disease: Secondary | ICD-10-CM | POA: Diagnosis not present

## 2017-10-21 DIAGNOSIS — D631 Anemia in chronic kidney disease: Secondary | ICD-10-CM | POA: Diagnosis not present

## 2017-10-21 DIAGNOSIS — N2581 Secondary hyperparathyroidism of renal origin: Secondary | ICD-10-CM | POA: Diagnosis not present

## 2017-10-21 DIAGNOSIS — D509 Iron deficiency anemia, unspecified: Secondary | ICD-10-CM | POA: Diagnosis not present

## 2017-10-21 DIAGNOSIS — N186 End stage renal disease: Secondary | ICD-10-CM | POA: Diagnosis not present

## 2017-10-21 DIAGNOSIS — E1129 Type 2 diabetes mellitus with other diabetic kidney complication: Secondary | ICD-10-CM | POA: Diagnosis not present

## 2017-10-23 DIAGNOSIS — N2581 Secondary hyperparathyroidism of renal origin: Secondary | ICD-10-CM | POA: Diagnosis not present

## 2017-10-23 DIAGNOSIS — D631 Anemia in chronic kidney disease: Secondary | ICD-10-CM | POA: Diagnosis not present

## 2017-10-23 DIAGNOSIS — N186 End stage renal disease: Secondary | ICD-10-CM | POA: Diagnosis not present

## 2017-10-23 DIAGNOSIS — D509 Iron deficiency anemia, unspecified: Secondary | ICD-10-CM | POA: Diagnosis not present

## 2017-10-23 DIAGNOSIS — E1129 Type 2 diabetes mellitus with other diabetic kidney complication: Secondary | ICD-10-CM | POA: Diagnosis not present

## 2017-10-26 DIAGNOSIS — N2581 Secondary hyperparathyroidism of renal origin: Secondary | ICD-10-CM | POA: Diagnosis not present

## 2017-10-26 DIAGNOSIS — D631 Anemia in chronic kidney disease: Secondary | ICD-10-CM | POA: Diagnosis not present

## 2017-10-26 DIAGNOSIS — N186 End stage renal disease: Secondary | ICD-10-CM | POA: Diagnosis not present

## 2017-10-26 DIAGNOSIS — E1129 Type 2 diabetes mellitus with other diabetic kidney complication: Secondary | ICD-10-CM | POA: Diagnosis not present

## 2017-10-26 DIAGNOSIS — D509 Iron deficiency anemia, unspecified: Secondary | ICD-10-CM | POA: Diagnosis not present

## 2017-10-28 DIAGNOSIS — E1129 Type 2 diabetes mellitus with other diabetic kidney complication: Secondary | ICD-10-CM | POA: Diagnosis not present

## 2017-10-28 DIAGNOSIS — N2581 Secondary hyperparathyroidism of renal origin: Secondary | ICD-10-CM | POA: Diagnosis not present

## 2017-10-28 DIAGNOSIS — D631 Anemia in chronic kidney disease: Secondary | ICD-10-CM | POA: Diagnosis not present

## 2017-10-28 DIAGNOSIS — D509 Iron deficiency anemia, unspecified: Secondary | ICD-10-CM | POA: Diagnosis not present

## 2017-10-28 DIAGNOSIS — N186 End stage renal disease: Secondary | ICD-10-CM | POA: Diagnosis not present

## 2017-10-30 DIAGNOSIS — D509 Iron deficiency anemia, unspecified: Secondary | ICD-10-CM | POA: Diagnosis not present

## 2017-10-30 DIAGNOSIS — N186 End stage renal disease: Secondary | ICD-10-CM | POA: Diagnosis not present

## 2017-10-30 DIAGNOSIS — E1129 Type 2 diabetes mellitus with other diabetic kidney complication: Secondary | ICD-10-CM | POA: Diagnosis not present

## 2017-10-30 DIAGNOSIS — N2581 Secondary hyperparathyroidism of renal origin: Secondary | ICD-10-CM | POA: Diagnosis not present

## 2017-10-30 DIAGNOSIS — D631 Anemia in chronic kidney disease: Secondary | ICD-10-CM | POA: Diagnosis not present

## 2017-11-02 DIAGNOSIS — E1129 Type 2 diabetes mellitus with other diabetic kidney complication: Secondary | ICD-10-CM | POA: Diagnosis not present

## 2017-11-02 DIAGNOSIS — N186 End stage renal disease: Secondary | ICD-10-CM | POA: Diagnosis not present

## 2017-11-02 DIAGNOSIS — D509 Iron deficiency anemia, unspecified: Secondary | ICD-10-CM | POA: Diagnosis not present

## 2017-11-02 DIAGNOSIS — D631 Anemia in chronic kidney disease: Secondary | ICD-10-CM | POA: Diagnosis not present

## 2017-11-02 DIAGNOSIS — N2581 Secondary hyperparathyroidism of renal origin: Secondary | ICD-10-CM | POA: Diagnosis not present

## 2017-11-04 DIAGNOSIS — D509 Iron deficiency anemia, unspecified: Secondary | ICD-10-CM | POA: Diagnosis not present

## 2017-11-04 DIAGNOSIS — D631 Anemia in chronic kidney disease: Secondary | ICD-10-CM | POA: Diagnosis not present

## 2017-11-04 DIAGNOSIS — N186 End stage renal disease: Secondary | ICD-10-CM | POA: Diagnosis not present

## 2017-11-04 DIAGNOSIS — N2581 Secondary hyperparathyroidism of renal origin: Secondary | ICD-10-CM | POA: Diagnosis not present

## 2017-11-04 DIAGNOSIS — E1129 Type 2 diabetes mellitus with other diabetic kidney complication: Secondary | ICD-10-CM | POA: Diagnosis not present

## 2017-11-06 DIAGNOSIS — N186 End stage renal disease: Secondary | ICD-10-CM | POA: Diagnosis not present

## 2017-11-06 DIAGNOSIS — D509 Iron deficiency anemia, unspecified: Secondary | ICD-10-CM | POA: Diagnosis not present

## 2017-11-06 DIAGNOSIS — E1129 Type 2 diabetes mellitus with other diabetic kidney complication: Secondary | ICD-10-CM | POA: Diagnosis not present

## 2017-11-06 DIAGNOSIS — D631 Anemia in chronic kidney disease: Secondary | ICD-10-CM | POA: Diagnosis not present

## 2017-11-06 DIAGNOSIS — N2581 Secondary hyperparathyroidism of renal origin: Secondary | ICD-10-CM | POA: Diagnosis not present

## 2017-11-08 DIAGNOSIS — D509 Iron deficiency anemia, unspecified: Secondary | ICD-10-CM | POA: Diagnosis not present

## 2017-11-08 DIAGNOSIS — D631 Anemia in chronic kidney disease: Secondary | ICD-10-CM | POA: Diagnosis not present

## 2017-11-08 DIAGNOSIS — N2581 Secondary hyperparathyroidism of renal origin: Secondary | ICD-10-CM | POA: Diagnosis not present

## 2017-11-08 DIAGNOSIS — E1129 Type 2 diabetes mellitus with other diabetic kidney complication: Secondary | ICD-10-CM | POA: Diagnosis not present

## 2017-11-08 DIAGNOSIS — N186 End stage renal disease: Secondary | ICD-10-CM | POA: Diagnosis not present

## 2017-11-11 DIAGNOSIS — N186 End stage renal disease: Secondary | ICD-10-CM | POA: Diagnosis not present

## 2017-11-11 DIAGNOSIS — E1129 Type 2 diabetes mellitus with other diabetic kidney complication: Secondary | ICD-10-CM | POA: Diagnosis not present

## 2017-11-11 DIAGNOSIS — N2581 Secondary hyperparathyroidism of renal origin: Secondary | ICD-10-CM | POA: Diagnosis not present

## 2017-11-11 DIAGNOSIS — D509 Iron deficiency anemia, unspecified: Secondary | ICD-10-CM | POA: Diagnosis not present

## 2017-11-11 DIAGNOSIS — D631 Anemia in chronic kidney disease: Secondary | ICD-10-CM | POA: Diagnosis not present

## 2017-11-13 DIAGNOSIS — N186 End stage renal disease: Secondary | ICD-10-CM | POA: Diagnosis not present

## 2017-11-13 DIAGNOSIS — N2581 Secondary hyperparathyroidism of renal origin: Secondary | ICD-10-CM | POA: Diagnosis not present

## 2017-11-13 DIAGNOSIS — E1129 Type 2 diabetes mellitus with other diabetic kidney complication: Secondary | ICD-10-CM | POA: Diagnosis not present

## 2017-11-13 DIAGNOSIS — D509 Iron deficiency anemia, unspecified: Secondary | ICD-10-CM | POA: Diagnosis not present

## 2017-11-13 DIAGNOSIS — D631 Anemia in chronic kidney disease: Secondary | ICD-10-CM | POA: Diagnosis not present

## 2017-11-15 DIAGNOSIS — D631 Anemia in chronic kidney disease: Secondary | ICD-10-CM | POA: Diagnosis not present

## 2017-11-15 DIAGNOSIS — E1129 Type 2 diabetes mellitus with other diabetic kidney complication: Secondary | ICD-10-CM | POA: Diagnosis not present

## 2017-11-15 DIAGNOSIS — N2581 Secondary hyperparathyroidism of renal origin: Secondary | ICD-10-CM | POA: Diagnosis not present

## 2017-11-15 DIAGNOSIS — Z992 Dependence on renal dialysis: Secondary | ICD-10-CM | POA: Diagnosis not present

## 2017-11-15 DIAGNOSIS — D509 Iron deficiency anemia, unspecified: Secondary | ICD-10-CM | POA: Diagnosis not present

## 2017-11-15 DIAGNOSIS — N186 End stage renal disease: Secondary | ICD-10-CM | POA: Diagnosis not present

## 2017-11-18 DIAGNOSIS — D631 Anemia in chronic kidney disease: Secondary | ICD-10-CM | POA: Diagnosis not present

## 2017-11-18 DIAGNOSIS — N2581 Secondary hyperparathyroidism of renal origin: Secondary | ICD-10-CM | POA: Diagnosis not present

## 2017-11-18 DIAGNOSIS — N186 End stage renal disease: Secondary | ICD-10-CM | POA: Diagnosis not present

## 2017-11-18 DIAGNOSIS — E1129 Type 2 diabetes mellitus with other diabetic kidney complication: Secondary | ICD-10-CM | POA: Diagnosis not present

## 2017-11-18 DIAGNOSIS — D509 Iron deficiency anemia, unspecified: Secondary | ICD-10-CM | POA: Diagnosis not present

## 2017-11-20 DIAGNOSIS — D509 Iron deficiency anemia, unspecified: Secondary | ICD-10-CM | POA: Diagnosis not present

## 2017-11-20 DIAGNOSIS — N2581 Secondary hyperparathyroidism of renal origin: Secondary | ICD-10-CM | POA: Diagnosis not present

## 2017-11-20 DIAGNOSIS — N186 End stage renal disease: Secondary | ICD-10-CM | POA: Diagnosis not present

## 2017-11-20 DIAGNOSIS — E1129 Type 2 diabetes mellitus with other diabetic kidney complication: Secondary | ICD-10-CM | POA: Diagnosis not present

## 2017-11-20 DIAGNOSIS — D631 Anemia in chronic kidney disease: Secondary | ICD-10-CM | POA: Diagnosis not present

## 2017-11-23 DIAGNOSIS — N2581 Secondary hyperparathyroidism of renal origin: Secondary | ICD-10-CM | POA: Diagnosis not present

## 2017-11-23 DIAGNOSIS — E1129 Type 2 diabetes mellitus with other diabetic kidney complication: Secondary | ICD-10-CM | POA: Diagnosis not present

## 2017-11-23 DIAGNOSIS — D509 Iron deficiency anemia, unspecified: Secondary | ICD-10-CM | POA: Diagnosis not present

## 2017-11-23 DIAGNOSIS — D631 Anemia in chronic kidney disease: Secondary | ICD-10-CM | POA: Diagnosis not present

## 2017-11-23 DIAGNOSIS — N186 End stage renal disease: Secondary | ICD-10-CM | POA: Diagnosis not present

## 2017-11-25 DIAGNOSIS — E1129 Type 2 diabetes mellitus with other diabetic kidney complication: Secondary | ICD-10-CM | POA: Diagnosis not present

## 2017-11-25 DIAGNOSIS — N186 End stage renal disease: Secondary | ICD-10-CM | POA: Diagnosis not present

## 2017-11-25 DIAGNOSIS — N2581 Secondary hyperparathyroidism of renal origin: Secondary | ICD-10-CM | POA: Diagnosis not present

## 2017-11-25 DIAGNOSIS — D631 Anemia in chronic kidney disease: Secondary | ICD-10-CM | POA: Diagnosis not present

## 2017-11-25 DIAGNOSIS — D509 Iron deficiency anemia, unspecified: Secondary | ICD-10-CM | POA: Diagnosis not present

## 2017-11-27 DIAGNOSIS — D631 Anemia in chronic kidney disease: Secondary | ICD-10-CM | POA: Diagnosis not present

## 2017-11-27 DIAGNOSIS — E1129 Type 2 diabetes mellitus with other diabetic kidney complication: Secondary | ICD-10-CM | POA: Diagnosis not present

## 2017-11-27 DIAGNOSIS — N186 End stage renal disease: Secondary | ICD-10-CM | POA: Diagnosis not present

## 2017-11-27 DIAGNOSIS — N2581 Secondary hyperparathyroidism of renal origin: Secondary | ICD-10-CM | POA: Diagnosis not present

## 2017-11-27 DIAGNOSIS — D509 Iron deficiency anemia, unspecified: Secondary | ICD-10-CM | POA: Diagnosis not present

## 2017-11-30 DIAGNOSIS — D631 Anemia in chronic kidney disease: Secondary | ICD-10-CM | POA: Diagnosis not present

## 2017-11-30 DIAGNOSIS — N2581 Secondary hyperparathyroidism of renal origin: Secondary | ICD-10-CM | POA: Diagnosis not present

## 2017-11-30 DIAGNOSIS — N186 End stage renal disease: Secondary | ICD-10-CM | POA: Diagnosis not present

## 2017-11-30 DIAGNOSIS — E1129 Type 2 diabetes mellitus with other diabetic kidney complication: Secondary | ICD-10-CM | POA: Diagnosis not present

## 2017-11-30 DIAGNOSIS — D509 Iron deficiency anemia, unspecified: Secondary | ICD-10-CM | POA: Diagnosis not present

## 2017-12-01 DIAGNOSIS — E782 Mixed hyperlipidemia: Secondary | ICD-10-CM | POA: Diagnosis not present

## 2017-12-01 DIAGNOSIS — N186 End stage renal disease: Secondary | ICD-10-CM | POA: Diagnosis not present

## 2017-12-01 DIAGNOSIS — Z794 Long term (current) use of insulin: Secondary | ICD-10-CM | POA: Diagnosis not present

## 2017-12-01 DIAGNOSIS — E1151 Type 2 diabetes mellitus with diabetic peripheral angiopathy without gangrene: Secondary | ICD-10-CM | POA: Diagnosis not present

## 2017-12-01 DIAGNOSIS — I10 Essential (primary) hypertension: Secondary | ICD-10-CM | POA: Diagnosis not present

## 2017-12-01 DIAGNOSIS — E1122 Type 2 diabetes mellitus with diabetic chronic kidney disease: Secondary | ICD-10-CM | POA: Diagnosis not present

## 2017-12-01 DIAGNOSIS — G546 Phantom limb syndrome with pain: Secondary | ICD-10-CM | POA: Diagnosis not present

## 2017-12-01 DIAGNOSIS — I129 Hypertensive chronic kidney disease with stage 1 through stage 4 chronic kidney disease, or unspecified chronic kidney disease: Secondary | ICD-10-CM | POA: Diagnosis not present

## 2017-12-01 DIAGNOSIS — Z992 Dependence on renal dialysis: Secondary | ICD-10-CM | POA: Diagnosis not present

## 2017-12-02 DIAGNOSIS — D509 Iron deficiency anemia, unspecified: Secondary | ICD-10-CM | POA: Diagnosis not present

## 2017-12-02 DIAGNOSIS — N2581 Secondary hyperparathyroidism of renal origin: Secondary | ICD-10-CM | POA: Diagnosis not present

## 2017-12-02 DIAGNOSIS — E1129 Type 2 diabetes mellitus with other diabetic kidney complication: Secondary | ICD-10-CM | POA: Diagnosis not present

## 2017-12-02 DIAGNOSIS — D631 Anemia in chronic kidney disease: Secondary | ICD-10-CM | POA: Diagnosis not present

## 2017-12-02 DIAGNOSIS — N186 End stage renal disease: Secondary | ICD-10-CM | POA: Diagnosis not present

## 2017-12-04 DIAGNOSIS — N186 End stage renal disease: Secondary | ICD-10-CM | POA: Diagnosis not present

## 2017-12-04 DIAGNOSIS — D509 Iron deficiency anemia, unspecified: Secondary | ICD-10-CM | POA: Diagnosis not present

## 2017-12-04 DIAGNOSIS — E1129 Type 2 diabetes mellitus with other diabetic kidney complication: Secondary | ICD-10-CM | POA: Diagnosis not present

## 2017-12-04 DIAGNOSIS — N2581 Secondary hyperparathyroidism of renal origin: Secondary | ICD-10-CM | POA: Diagnosis not present

## 2017-12-04 DIAGNOSIS — D631 Anemia in chronic kidney disease: Secondary | ICD-10-CM | POA: Diagnosis not present

## 2017-12-07 DIAGNOSIS — D631 Anemia in chronic kidney disease: Secondary | ICD-10-CM | POA: Diagnosis not present

## 2017-12-07 DIAGNOSIS — N186 End stage renal disease: Secondary | ICD-10-CM | POA: Diagnosis not present

## 2017-12-07 DIAGNOSIS — E1129 Type 2 diabetes mellitus with other diabetic kidney complication: Secondary | ICD-10-CM | POA: Diagnosis not present

## 2017-12-07 DIAGNOSIS — N2581 Secondary hyperparathyroidism of renal origin: Secondary | ICD-10-CM | POA: Diagnosis not present

## 2017-12-07 DIAGNOSIS — D509 Iron deficiency anemia, unspecified: Secondary | ICD-10-CM | POA: Diagnosis not present

## 2017-12-09 DIAGNOSIS — D509 Iron deficiency anemia, unspecified: Secondary | ICD-10-CM | POA: Diagnosis not present

## 2017-12-09 DIAGNOSIS — E1129 Type 2 diabetes mellitus with other diabetic kidney complication: Secondary | ICD-10-CM | POA: Diagnosis not present

## 2017-12-09 DIAGNOSIS — N2581 Secondary hyperparathyroidism of renal origin: Secondary | ICD-10-CM | POA: Diagnosis not present

## 2017-12-09 DIAGNOSIS — N186 End stage renal disease: Secondary | ICD-10-CM | POA: Diagnosis not present

## 2017-12-09 DIAGNOSIS — D631 Anemia in chronic kidney disease: Secondary | ICD-10-CM | POA: Diagnosis not present

## 2017-12-11 DIAGNOSIS — N2581 Secondary hyperparathyroidism of renal origin: Secondary | ICD-10-CM | POA: Diagnosis not present

## 2017-12-11 DIAGNOSIS — E1129 Type 2 diabetes mellitus with other diabetic kidney complication: Secondary | ICD-10-CM | POA: Diagnosis not present

## 2017-12-11 DIAGNOSIS — D631 Anemia in chronic kidney disease: Secondary | ICD-10-CM | POA: Diagnosis not present

## 2017-12-11 DIAGNOSIS — D509 Iron deficiency anemia, unspecified: Secondary | ICD-10-CM | POA: Diagnosis not present

## 2017-12-11 DIAGNOSIS — N186 End stage renal disease: Secondary | ICD-10-CM | POA: Diagnosis not present

## 2017-12-14 DIAGNOSIS — D509 Iron deficiency anemia, unspecified: Secondary | ICD-10-CM | POA: Diagnosis not present

## 2017-12-14 DIAGNOSIS — E1129 Type 2 diabetes mellitus with other diabetic kidney complication: Secondary | ICD-10-CM | POA: Diagnosis not present

## 2017-12-14 DIAGNOSIS — N2581 Secondary hyperparathyroidism of renal origin: Secondary | ICD-10-CM | POA: Diagnosis not present

## 2017-12-14 DIAGNOSIS — N186 End stage renal disease: Secondary | ICD-10-CM | POA: Diagnosis not present

## 2017-12-14 DIAGNOSIS — D631 Anemia in chronic kidney disease: Secondary | ICD-10-CM | POA: Diagnosis not present

## 2017-12-16 DIAGNOSIS — D509 Iron deficiency anemia, unspecified: Secondary | ICD-10-CM | POA: Diagnosis not present

## 2017-12-16 DIAGNOSIS — N2581 Secondary hyperparathyroidism of renal origin: Secondary | ICD-10-CM | POA: Diagnosis not present

## 2017-12-16 DIAGNOSIS — Z992 Dependence on renal dialysis: Secondary | ICD-10-CM | POA: Diagnosis not present

## 2017-12-16 DIAGNOSIS — E1129 Type 2 diabetes mellitus with other diabetic kidney complication: Secondary | ICD-10-CM | POA: Diagnosis not present

## 2017-12-16 DIAGNOSIS — D631 Anemia in chronic kidney disease: Secondary | ICD-10-CM | POA: Diagnosis not present

## 2017-12-16 DIAGNOSIS — N186 End stage renal disease: Secondary | ICD-10-CM | POA: Diagnosis not present

## 2017-12-17 DIAGNOSIS — N186 End stage renal disease: Secondary | ICD-10-CM | POA: Diagnosis not present

## 2017-12-17 DIAGNOSIS — Z992 Dependence on renal dialysis: Secondary | ICD-10-CM | POA: Diagnosis not present

## 2017-12-17 DIAGNOSIS — E1129 Type 2 diabetes mellitus with other diabetic kidney complication: Secondary | ICD-10-CM | POA: Diagnosis not present

## 2017-12-18 DIAGNOSIS — D631 Anemia in chronic kidney disease: Secondary | ICD-10-CM | POA: Diagnosis not present

## 2017-12-18 DIAGNOSIS — D509 Iron deficiency anemia, unspecified: Secondary | ICD-10-CM | POA: Diagnosis not present

## 2017-12-18 DIAGNOSIS — N2581 Secondary hyperparathyroidism of renal origin: Secondary | ICD-10-CM | POA: Diagnosis not present

## 2017-12-18 DIAGNOSIS — N186 End stage renal disease: Secondary | ICD-10-CM | POA: Diagnosis not present

## 2017-12-18 DIAGNOSIS — E1129 Type 2 diabetes mellitus with other diabetic kidney complication: Secondary | ICD-10-CM | POA: Diagnosis not present

## 2017-12-21 DIAGNOSIS — E1129 Type 2 diabetes mellitus with other diabetic kidney complication: Secondary | ICD-10-CM | POA: Diagnosis not present

## 2017-12-21 DIAGNOSIS — D631 Anemia in chronic kidney disease: Secondary | ICD-10-CM | POA: Diagnosis not present

## 2017-12-21 DIAGNOSIS — N186 End stage renal disease: Secondary | ICD-10-CM | POA: Diagnosis not present

## 2017-12-21 DIAGNOSIS — N2581 Secondary hyperparathyroidism of renal origin: Secondary | ICD-10-CM | POA: Diagnosis not present

## 2017-12-21 DIAGNOSIS — D509 Iron deficiency anemia, unspecified: Secondary | ICD-10-CM | POA: Diagnosis not present

## 2017-12-23 DIAGNOSIS — D631 Anemia in chronic kidney disease: Secondary | ICD-10-CM | POA: Diagnosis not present

## 2017-12-23 DIAGNOSIS — E1129 Type 2 diabetes mellitus with other diabetic kidney complication: Secondary | ICD-10-CM | POA: Diagnosis not present

## 2017-12-23 DIAGNOSIS — N2581 Secondary hyperparathyroidism of renal origin: Secondary | ICD-10-CM | POA: Diagnosis not present

## 2017-12-23 DIAGNOSIS — N186 End stage renal disease: Secondary | ICD-10-CM | POA: Diagnosis not present

## 2017-12-23 DIAGNOSIS — D509 Iron deficiency anemia, unspecified: Secondary | ICD-10-CM | POA: Diagnosis not present

## 2017-12-25 DIAGNOSIS — E1129 Type 2 diabetes mellitus with other diabetic kidney complication: Secondary | ICD-10-CM | POA: Diagnosis not present

## 2017-12-25 DIAGNOSIS — N186 End stage renal disease: Secondary | ICD-10-CM | POA: Diagnosis not present

## 2017-12-25 DIAGNOSIS — D509 Iron deficiency anemia, unspecified: Secondary | ICD-10-CM | POA: Diagnosis not present

## 2017-12-25 DIAGNOSIS — N2581 Secondary hyperparathyroidism of renal origin: Secondary | ICD-10-CM | POA: Diagnosis not present

## 2017-12-25 DIAGNOSIS — D631 Anemia in chronic kidney disease: Secondary | ICD-10-CM | POA: Diagnosis not present

## 2017-12-28 DIAGNOSIS — D509 Iron deficiency anemia, unspecified: Secondary | ICD-10-CM | POA: Diagnosis not present

## 2017-12-28 DIAGNOSIS — N2581 Secondary hyperparathyroidism of renal origin: Secondary | ICD-10-CM | POA: Diagnosis not present

## 2017-12-28 DIAGNOSIS — E1129 Type 2 diabetes mellitus with other diabetic kidney complication: Secondary | ICD-10-CM | POA: Diagnosis not present

## 2017-12-28 DIAGNOSIS — D631 Anemia in chronic kidney disease: Secondary | ICD-10-CM | POA: Diagnosis not present

## 2017-12-28 DIAGNOSIS — N186 End stage renal disease: Secondary | ICD-10-CM | POA: Diagnosis not present

## 2017-12-30 DIAGNOSIS — N186 End stage renal disease: Secondary | ICD-10-CM | POA: Diagnosis not present

## 2017-12-30 DIAGNOSIS — E1129 Type 2 diabetes mellitus with other diabetic kidney complication: Secondary | ICD-10-CM | POA: Diagnosis not present

## 2017-12-30 DIAGNOSIS — D631 Anemia in chronic kidney disease: Secondary | ICD-10-CM | POA: Diagnosis not present

## 2017-12-30 DIAGNOSIS — N2581 Secondary hyperparathyroidism of renal origin: Secondary | ICD-10-CM | POA: Diagnosis not present

## 2017-12-30 DIAGNOSIS — D509 Iron deficiency anemia, unspecified: Secondary | ICD-10-CM | POA: Diagnosis not present

## 2018-01-01 DIAGNOSIS — N2581 Secondary hyperparathyroidism of renal origin: Secondary | ICD-10-CM | POA: Diagnosis not present

## 2018-01-01 DIAGNOSIS — D631 Anemia in chronic kidney disease: Secondary | ICD-10-CM | POA: Diagnosis not present

## 2018-01-01 DIAGNOSIS — D509 Iron deficiency anemia, unspecified: Secondary | ICD-10-CM | POA: Diagnosis not present

## 2018-01-01 DIAGNOSIS — N186 End stage renal disease: Secondary | ICD-10-CM | POA: Diagnosis not present

## 2018-01-01 DIAGNOSIS — E1129 Type 2 diabetes mellitus with other diabetic kidney complication: Secondary | ICD-10-CM | POA: Diagnosis not present

## 2018-01-04 DIAGNOSIS — N2581 Secondary hyperparathyroidism of renal origin: Secondary | ICD-10-CM | POA: Diagnosis not present

## 2018-01-04 DIAGNOSIS — E1129 Type 2 diabetes mellitus with other diabetic kidney complication: Secondary | ICD-10-CM | POA: Diagnosis not present

## 2018-01-04 DIAGNOSIS — N186 End stage renal disease: Secondary | ICD-10-CM | POA: Diagnosis not present

## 2018-01-04 DIAGNOSIS — D509 Iron deficiency anemia, unspecified: Secondary | ICD-10-CM | POA: Diagnosis not present

## 2018-01-04 DIAGNOSIS — D631 Anemia in chronic kidney disease: Secondary | ICD-10-CM | POA: Diagnosis not present

## 2018-01-06 DIAGNOSIS — E1129 Type 2 diabetes mellitus with other diabetic kidney complication: Secondary | ICD-10-CM | POA: Diagnosis not present

## 2018-01-06 DIAGNOSIS — N186 End stage renal disease: Secondary | ICD-10-CM | POA: Diagnosis not present

## 2018-01-06 DIAGNOSIS — D509 Iron deficiency anemia, unspecified: Secondary | ICD-10-CM | POA: Diagnosis not present

## 2018-01-06 DIAGNOSIS — N2581 Secondary hyperparathyroidism of renal origin: Secondary | ICD-10-CM | POA: Diagnosis not present

## 2018-01-06 DIAGNOSIS — D631 Anemia in chronic kidney disease: Secondary | ICD-10-CM | POA: Diagnosis not present

## 2018-01-08 DIAGNOSIS — N2581 Secondary hyperparathyroidism of renal origin: Secondary | ICD-10-CM | POA: Diagnosis not present

## 2018-01-08 DIAGNOSIS — D509 Iron deficiency anemia, unspecified: Secondary | ICD-10-CM | POA: Diagnosis not present

## 2018-01-08 DIAGNOSIS — E1129 Type 2 diabetes mellitus with other diabetic kidney complication: Secondary | ICD-10-CM | POA: Diagnosis not present

## 2018-01-08 DIAGNOSIS — D631 Anemia in chronic kidney disease: Secondary | ICD-10-CM | POA: Diagnosis not present

## 2018-01-08 DIAGNOSIS — N186 End stage renal disease: Secondary | ICD-10-CM | POA: Diagnosis not present

## 2018-01-11 DIAGNOSIS — N186 End stage renal disease: Secondary | ICD-10-CM | POA: Diagnosis not present

## 2018-01-11 DIAGNOSIS — D631 Anemia in chronic kidney disease: Secondary | ICD-10-CM | POA: Diagnosis not present

## 2018-01-11 DIAGNOSIS — E1129 Type 2 diabetes mellitus with other diabetic kidney complication: Secondary | ICD-10-CM | POA: Diagnosis not present

## 2018-01-11 DIAGNOSIS — N2581 Secondary hyperparathyroidism of renal origin: Secondary | ICD-10-CM | POA: Diagnosis not present

## 2018-01-11 DIAGNOSIS — D509 Iron deficiency anemia, unspecified: Secondary | ICD-10-CM | POA: Diagnosis not present

## 2018-01-13 DIAGNOSIS — D509 Iron deficiency anemia, unspecified: Secondary | ICD-10-CM | POA: Diagnosis not present

## 2018-01-13 DIAGNOSIS — D631 Anemia in chronic kidney disease: Secondary | ICD-10-CM | POA: Diagnosis not present

## 2018-01-13 DIAGNOSIS — N186 End stage renal disease: Secondary | ICD-10-CM | POA: Diagnosis not present

## 2018-01-13 DIAGNOSIS — E1129 Type 2 diabetes mellitus with other diabetic kidney complication: Secondary | ICD-10-CM | POA: Diagnosis not present

## 2018-01-13 DIAGNOSIS — N2581 Secondary hyperparathyroidism of renal origin: Secondary | ICD-10-CM | POA: Diagnosis not present

## 2018-01-14 DIAGNOSIS — N186 End stage renal disease: Secondary | ICD-10-CM | POA: Diagnosis not present

## 2018-01-14 DIAGNOSIS — E1129 Type 2 diabetes mellitus with other diabetic kidney complication: Secondary | ICD-10-CM | POA: Diagnosis not present

## 2018-01-14 DIAGNOSIS — Z992 Dependence on renal dialysis: Secondary | ICD-10-CM | POA: Diagnosis not present

## 2018-01-15 DIAGNOSIS — E1129 Type 2 diabetes mellitus with other diabetic kidney complication: Secondary | ICD-10-CM | POA: Diagnosis not present

## 2018-01-15 DIAGNOSIS — N186 End stage renal disease: Secondary | ICD-10-CM | POA: Diagnosis not present

## 2018-01-15 DIAGNOSIS — D631 Anemia in chronic kidney disease: Secondary | ICD-10-CM | POA: Diagnosis not present

## 2018-01-15 DIAGNOSIS — N2581 Secondary hyperparathyroidism of renal origin: Secondary | ICD-10-CM | POA: Diagnosis not present

## 2018-01-18 DIAGNOSIS — E1129 Type 2 diabetes mellitus with other diabetic kidney complication: Secondary | ICD-10-CM | POA: Diagnosis not present

## 2018-01-18 DIAGNOSIS — N186 End stage renal disease: Secondary | ICD-10-CM | POA: Diagnosis not present

## 2018-01-18 DIAGNOSIS — D631 Anemia in chronic kidney disease: Secondary | ICD-10-CM | POA: Diagnosis not present

## 2018-01-18 DIAGNOSIS — N2581 Secondary hyperparathyroidism of renal origin: Secondary | ICD-10-CM | POA: Diagnosis not present

## 2018-01-20 DIAGNOSIS — E1129 Type 2 diabetes mellitus with other diabetic kidney complication: Secondary | ICD-10-CM | POA: Diagnosis not present

## 2018-01-20 DIAGNOSIS — N2581 Secondary hyperparathyroidism of renal origin: Secondary | ICD-10-CM | POA: Diagnosis not present

## 2018-01-20 DIAGNOSIS — D631 Anemia in chronic kidney disease: Secondary | ICD-10-CM | POA: Diagnosis not present

## 2018-01-20 DIAGNOSIS — N186 End stage renal disease: Secondary | ICD-10-CM | POA: Diagnosis not present

## 2018-01-22 DIAGNOSIS — D631 Anemia in chronic kidney disease: Secondary | ICD-10-CM | POA: Diagnosis not present

## 2018-01-22 DIAGNOSIS — N2581 Secondary hyperparathyroidism of renal origin: Secondary | ICD-10-CM | POA: Diagnosis not present

## 2018-01-22 DIAGNOSIS — E1129 Type 2 diabetes mellitus with other diabetic kidney complication: Secondary | ICD-10-CM | POA: Diagnosis not present

## 2018-01-22 DIAGNOSIS — N186 End stage renal disease: Secondary | ICD-10-CM | POA: Diagnosis not present

## 2018-01-25 DIAGNOSIS — E1129 Type 2 diabetes mellitus with other diabetic kidney complication: Secondary | ICD-10-CM | POA: Diagnosis not present

## 2018-01-25 DIAGNOSIS — D631 Anemia in chronic kidney disease: Secondary | ICD-10-CM | POA: Diagnosis not present

## 2018-01-25 DIAGNOSIS — N2581 Secondary hyperparathyroidism of renal origin: Secondary | ICD-10-CM | POA: Diagnosis not present

## 2018-01-25 DIAGNOSIS — N186 End stage renal disease: Secondary | ICD-10-CM | POA: Diagnosis not present

## 2018-01-27 DIAGNOSIS — N186 End stage renal disease: Secondary | ICD-10-CM | POA: Diagnosis not present

## 2018-01-27 DIAGNOSIS — N2581 Secondary hyperparathyroidism of renal origin: Secondary | ICD-10-CM | POA: Diagnosis not present

## 2018-01-27 DIAGNOSIS — E1129 Type 2 diabetes mellitus with other diabetic kidney complication: Secondary | ICD-10-CM | POA: Diagnosis not present

## 2018-01-27 DIAGNOSIS — D631 Anemia in chronic kidney disease: Secondary | ICD-10-CM | POA: Diagnosis not present

## 2018-01-29 DIAGNOSIS — D631 Anemia in chronic kidney disease: Secondary | ICD-10-CM | POA: Diagnosis not present

## 2018-01-29 DIAGNOSIS — N186 End stage renal disease: Secondary | ICD-10-CM | POA: Diagnosis not present

## 2018-01-29 DIAGNOSIS — E1129 Type 2 diabetes mellitus with other diabetic kidney complication: Secondary | ICD-10-CM | POA: Diagnosis not present

## 2018-01-29 DIAGNOSIS — N2581 Secondary hyperparathyroidism of renal origin: Secondary | ICD-10-CM | POA: Diagnosis not present

## 2018-02-01 DIAGNOSIS — N2581 Secondary hyperparathyroidism of renal origin: Secondary | ICD-10-CM | POA: Diagnosis not present

## 2018-02-01 DIAGNOSIS — D631 Anemia in chronic kidney disease: Secondary | ICD-10-CM | POA: Diagnosis not present

## 2018-02-01 DIAGNOSIS — E1129 Type 2 diabetes mellitus with other diabetic kidney complication: Secondary | ICD-10-CM | POA: Diagnosis not present

## 2018-02-01 DIAGNOSIS — N186 End stage renal disease: Secondary | ICD-10-CM | POA: Diagnosis not present

## 2018-02-03 DIAGNOSIS — N186 End stage renal disease: Secondary | ICD-10-CM | POA: Diagnosis not present

## 2018-02-03 DIAGNOSIS — D631 Anemia in chronic kidney disease: Secondary | ICD-10-CM | POA: Diagnosis not present

## 2018-02-03 DIAGNOSIS — N2581 Secondary hyperparathyroidism of renal origin: Secondary | ICD-10-CM | POA: Diagnosis not present

## 2018-02-03 DIAGNOSIS — E1129 Type 2 diabetes mellitus with other diabetic kidney complication: Secondary | ICD-10-CM | POA: Diagnosis not present

## 2018-02-05 DIAGNOSIS — E1129 Type 2 diabetes mellitus with other diabetic kidney complication: Secondary | ICD-10-CM | POA: Diagnosis not present

## 2018-02-05 DIAGNOSIS — D631 Anemia in chronic kidney disease: Secondary | ICD-10-CM | POA: Diagnosis not present

## 2018-02-05 DIAGNOSIS — N186 End stage renal disease: Secondary | ICD-10-CM | POA: Diagnosis not present

## 2018-02-05 DIAGNOSIS — N2581 Secondary hyperparathyroidism of renal origin: Secondary | ICD-10-CM | POA: Diagnosis not present

## 2018-02-08 DIAGNOSIS — D631 Anemia in chronic kidney disease: Secondary | ICD-10-CM | POA: Diagnosis not present

## 2018-02-08 DIAGNOSIS — N2581 Secondary hyperparathyroidism of renal origin: Secondary | ICD-10-CM | POA: Diagnosis not present

## 2018-02-08 DIAGNOSIS — N186 End stage renal disease: Secondary | ICD-10-CM | POA: Diagnosis not present

## 2018-02-08 DIAGNOSIS — E1129 Type 2 diabetes mellitus with other diabetic kidney complication: Secondary | ICD-10-CM | POA: Diagnosis not present

## 2018-02-09 DIAGNOSIS — E782 Mixed hyperlipidemia: Secondary | ICD-10-CM | POA: Diagnosis not present

## 2018-02-10 DIAGNOSIS — D631 Anemia in chronic kidney disease: Secondary | ICD-10-CM | POA: Diagnosis not present

## 2018-02-10 DIAGNOSIS — N2581 Secondary hyperparathyroidism of renal origin: Secondary | ICD-10-CM | POA: Diagnosis not present

## 2018-02-10 DIAGNOSIS — E1129 Type 2 diabetes mellitus with other diabetic kidney complication: Secondary | ICD-10-CM | POA: Diagnosis not present

## 2018-02-10 DIAGNOSIS — N186 End stage renal disease: Secondary | ICD-10-CM | POA: Diagnosis not present

## 2018-02-12 DIAGNOSIS — E1129 Type 2 diabetes mellitus with other diabetic kidney complication: Secondary | ICD-10-CM | POA: Diagnosis not present

## 2018-02-12 DIAGNOSIS — N2581 Secondary hyperparathyroidism of renal origin: Secondary | ICD-10-CM | POA: Diagnosis not present

## 2018-02-12 DIAGNOSIS — D631 Anemia in chronic kidney disease: Secondary | ICD-10-CM | POA: Diagnosis not present

## 2018-02-12 DIAGNOSIS — N186 End stage renal disease: Secondary | ICD-10-CM | POA: Diagnosis not present

## 2018-02-14 DIAGNOSIS — E1129 Type 2 diabetes mellitus with other diabetic kidney complication: Secondary | ICD-10-CM | POA: Diagnosis not present

## 2018-02-14 DIAGNOSIS — N186 End stage renal disease: Secondary | ICD-10-CM | POA: Diagnosis not present

## 2018-02-14 DIAGNOSIS — Z992 Dependence on renal dialysis: Secondary | ICD-10-CM | POA: Diagnosis not present

## 2018-02-15 DIAGNOSIS — N2581 Secondary hyperparathyroidism of renal origin: Secondary | ICD-10-CM | POA: Diagnosis not present

## 2018-02-15 DIAGNOSIS — E1129 Type 2 diabetes mellitus with other diabetic kidney complication: Secondary | ICD-10-CM | POA: Diagnosis not present

## 2018-02-15 DIAGNOSIS — D509 Iron deficiency anemia, unspecified: Secondary | ICD-10-CM | POA: Diagnosis not present

## 2018-02-15 DIAGNOSIS — N186 End stage renal disease: Secondary | ICD-10-CM | POA: Diagnosis not present

## 2018-02-17 DIAGNOSIS — E1129 Type 2 diabetes mellitus with other diabetic kidney complication: Secondary | ICD-10-CM | POA: Diagnosis not present

## 2018-02-17 DIAGNOSIS — D509 Iron deficiency anemia, unspecified: Secondary | ICD-10-CM | POA: Diagnosis not present

## 2018-02-17 DIAGNOSIS — N186 End stage renal disease: Secondary | ICD-10-CM | POA: Diagnosis not present

## 2018-02-17 DIAGNOSIS — N2581 Secondary hyperparathyroidism of renal origin: Secondary | ICD-10-CM | POA: Diagnosis not present

## 2018-02-19 DIAGNOSIS — E1129 Type 2 diabetes mellitus with other diabetic kidney complication: Secondary | ICD-10-CM | POA: Diagnosis not present

## 2018-02-19 DIAGNOSIS — N2581 Secondary hyperparathyroidism of renal origin: Secondary | ICD-10-CM | POA: Diagnosis not present

## 2018-02-19 DIAGNOSIS — D509 Iron deficiency anemia, unspecified: Secondary | ICD-10-CM | POA: Diagnosis not present

## 2018-02-19 DIAGNOSIS — N186 End stage renal disease: Secondary | ICD-10-CM | POA: Diagnosis not present

## 2018-02-22 DIAGNOSIS — N186 End stage renal disease: Secondary | ICD-10-CM | POA: Diagnosis not present

## 2018-02-22 DIAGNOSIS — E1129 Type 2 diabetes mellitus with other diabetic kidney complication: Secondary | ICD-10-CM | POA: Diagnosis not present

## 2018-02-22 DIAGNOSIS — D509 Iron deficiency anemia, unspecified: Secondary | ICD-10-CM | POA: Diagnosis not present

## 2018-02-22 DIAGNOSIS — N2581 Secondary hyperparathyroidism of renal origin: Secondary | ICD-10-CM | POA: Diagnosis not present

## 2018-02-24 DIAGNOSIS — E1129 Type 2 diabetes mellitus with other diabetic kidney complication: Secondary | ICD-10-CM | POA: Diagnosis not present

## 2018-02-24 DIAGNOSIS — D509 Iron deficiency anemia, unspecified: Secondary | ICD-10-CM | POA: Diagnosis not present

## 2018-02-24 DIAGNOSIS — N186 End stage renal disease: Secondary | ICD-10-CM | POA: Diagnosis not present

## 2018-02-24 DIAGNOSIS — N2581 Secondary hyperparathyroidism of renal origin: Secondary | ICD-10-CM | POA: Diagnosis not present

## 2018-02-26 DIAGNOSIS — N186 End stage renal disease: Secondary | ICD-10-CM | POA: Diagnosis not present

## 2018-02-26 DIAGNOSIS — N2581 Secondary hyperparathyroidism of renal origin: Secondary | ICD-10-CM | POA: Diagnosis not present

## 2018-02-26 DIAGNOSIS — D509 Iron deficiency anemia, unspecified: Secondary | ICD-10-CM | POA: Diagnosis not present

## 2018-02-26 DIAGNOSIS — E1129 Type 2 diabetes mellitus with other diabetic kidney complication: Secondary | ICD-10-CM | POA: Diagnosis not present

## 2018-03-01 DIAGNOSIS — E1129 Type 2 diabetes mellitus with other diabetic kidney complication: Secondary | ICD-10-CM | POA: Diagnosis not present

## 2018-03-01 DIAGNOSIS — N2581 Secondary hyperparathyroidism of renal origin: Secondary | ICD-10-CM | POA: Diagnosis not present

## 2018-03-01 DIAGNOSIS — N186 End stage renal disease: Secondary | ICD-10-CM | POA: Diagnosis not present

## 2018-03-01 DIAGNOSIS — D509 Iron deficiency anemia, unspecified: Secondary | ICD-10-CM | POA: Diagnosis not present

## 2018-03-03 DIAGNOSIS — N2581 Secondary hyperparathyroidism of renal origin: Secondary | ICD-10-CM | POA: Diagnosis not present

## 2018-03-03 DIAGNOSIS — D509 Iron deficiency anemia, unspecified: Secondary | ICD-10-CM | POA: Diagnosis not present

## 2018-03-03 DIAGNOSIS — E1129 Type 2 diabetes mellitus with other diabetic kidney complication: Secondary | ICD-10-CM | POA: Diagnosis not present

## 2018-03-03 DIAGNOSIS — N186 End stage renal disease: Secondary | ICD-10-CM | POA: Diagnosis not present

## 2018-03-05 DIAGNOSIS — N186 End stage renal disease: Secondary | ICD-10-CM | POA: Diagnosis not present

## 2018-03-05 DIAGNOSIS — N2581 Secondary hyperparathyroidism of renal origin: Secondary | ICD-10-CM | POA: Diagnosis not present

## 2018-03-05 DIAGNOSIS — E1129 Type 2 diabetes mellitus with other diabetic kidney complication: Secondary | ICD-10-CM | POA: Diagnosis not present

## 2018-03-05 DIAGNOSIS — D509 Iron deficiency anemia, unspecified: Secondary | ICD-10-CM | POA: Diagnosis not present

## 2018-03-07 DIAGNOSIS — R32 Unspecified urinary incontinence: Secondary | ICD-10-CM | POA: Diagnosis not present

## 2018-03-08 DIAGNOSIS — N2581 Secondary hyperparathyroidism of renal origin: Secondary | ICD-10-CM | POA: Diagnosis not present

## 2018-03-08 DIAGNOSIS — D509 Iron deficiency anemia, unspecified: Secondary | ICD-10-CM | POA: Diagnosis not present

## 2018-03-08 DIAGNOSIS — N186 End stage renal disease: Secondary | ICD-10-CM | POA: Diagnosis not present

## 2018-03-08 DIAGNOSIS — E1129 Type 2 diabetes mellitus with other diabetic kidney complication: Secondary | ICD-10-CM | POA: Diagnosis not present

## 2018-03-10 DIAGNOSIS — E1129 Type 2 diabetes mellitus with other diabetic kidney complication: Secondary | ICD-10-CM | POA: Diagnosis not present

## 2018-03-10 DIAGNOSIS — D509 Iron deficiency anemia, unspecified: Secondary | ICD-10-CM | POA: Diagnosis not present

## 2018-03-10 DIAGNOSIS — N186 End stage renal disease: Secondary | ICD-10-CM | POA: Diagnosis not present

## 2018-03-10 DIAGNOSIS — N2581 Secondary hyperparathyroidism of renal origin: Secondary | ICD-10-CM | POA: Diagnosis not present

## 2018-03-12 DIAGNOSIS — E1129 Type 2 diabetes mellitus with other diabetic kidney complication: Secondary | ICD-10-CM | POA: Diagnosis not present

## 2018-03-12 DIAGNOSIS — D509 Iron deficiency anemia, unspecified: Secondary | ICD-10-CM | POA: Diagnosis not present

## 2018-03-12 DIAGNOSIS — N186 End stage renal disease: Secondary | ICD-10-CM | POA: Diagnosis not present

## 2018-03-12 DIAGNOSIS — N2581 Secondary hyperparathyroidism of renal origin: Secondary | ICD-10-CM | POA: Diagnosis not present

## 2018-03-15 DIAGNOSIS — E1129 Type 2 diabetes mellitus with other diabetic kidney complication: Secondary | ICD-10-CM | POA: Diagnosis not present

## 2018-03-15 DIAGNOSIS — N186 End stage renal disease: Secondary | ICD-10-CM | POA: Diagnosis not present

## 2018-03-15 DIAGNOSIS — D509 Iron deficiency anemia, unspecified: Secondary | ICD-10-CM | POA: Diagnosis not present

## 2018-03-15 DIAGNOSIS — N2581 Secondary hyperparathyroidism of renal origin: Secondary | ICD-10-CM | POA: Diagnosis not present

## 2018-03-16 DIAGNOSIS — E1129 Type 2 diabetes mellitus with other diabetic kidney complication: Secondary | ICD-10-CM | POA: Diagnosis not present

## 2018-03-16 DIAGNOSIS — Z992 Dependence on renal dialysis: Secondary | ICD-10-CM | POA: Diagnosis not present

## 2018-03-16 DIAGNOSIS — N186 End stage renal disease: Secondary | ICD-10-CM | POA: Diagnosis not present

## 2018-03-17 DIAGNOSIS — N2581 Secondary hyperparathyroidism of renal origin: Secondary | ICD-10-CM | POA: Diagnosis not present

## 2018-03-17 DIAGNOSIS — E1129 Type 2 diabetes mellitus with other diabetic kidney complication: Secondary | ICD-10-CM | POA: Diagnosis not present

## 2018-03-17 DIAGNOSIS — D509 Iron deficiency anemia, unspecified: Secondary | ICD-10-CM | POA: Diagnosis not present

## 2018-03-17 DIAGNOSIS — D631 Anemia in chronic kidney disease: Secondary | ICD-10-CM | POA: Diagnosis not present

## 2018-03-17 DIAGNOSIS — N186 End stage renal disease: Secondary | ICD-10-CM | POA: Diagnosis not present

## 2018-03-19 DIAGNOSIS — N2581 Secondary hyperparathyroidism of renal origin: Secondary | ICD-10-CM | POA: Diagnosis not present

## 2018-03-19 DIAGNOSIS — D509 Iron deficiency anemia, unspecified: Secondary | ICD-10-CM | POA: Diagnosis not present

## 2018-03-19 DIAGNOSIS — D631 Anemia in chronic kidney disease: Secondary | ICD-10-CM | POA: Diagnosis not present

## 2018-03-19 DIAGNOSIS — E1129 Type 2 diabetes mellitus with other diabetic kidney complication: Secondary | ICD-10-CM | POA: Diagnosis not present

## 2018-03-19 DIAGNOSIS — N186 End stage renal disease: Secondary | ICD-10-CM | POA: Diagnosis not present

## 2018-03-21 DIAGNOSIS — I871 Compression of vein: Secondary | ICD-10-CM | POA: Diagnosis not present

## 2018-03-21 DIAGNOSIS — T82858A Stenosis of vascular prosthetic devices, implants and grafts, initial encounter: Secondary | ICD-10-CM | POA: Diagnosis not present

## 2018-03-21 DIAGNOSIS — N186 End stage renal disease: Secondary | ICD-10-CM | POA: Diagnosis not present

## 2018-03-22 DIAGNOSIS — N2581 Secondary hyperparathyroidism of renal origin: Secondary | ICD-10-CM | POA: Diagnosis not present

## 2018-03-22 DIAGNOSIS — D509 Iron deficiency anemia, unspecified: Secondary | ICD-10-CM | POA: Diagnosis not present

## 2018-03-22 DIAGNOSIS — N186 End stage renal disease: Secondary | ICD-10-CM | POA: Diagnosis not present

## 2018-03-22 DIAGNOSIS — D631 Anemia in chronic kidney disease: Secondary | ICD-10-CM | POA: Diagnosis not present

## 2018-03-22 DIAGNOSIS — E1129 Type 2 diabetes mellitus with other diabetic kidney complication: Secondary | ICD-10-CM | POA: Diagnosis not present

## 2018-03-24 DIAGNOSIS — D631 Anemia in chronic kidney disease: Secondary | ICD-10-CM | POA: Diagnosis not present

## 2018-03-24 DIAGNOSIS — D509 Iron deficiency anemia, unspecified: Secondary | ICD-10-CM | POA: Diagnosis not present

## 2018-03-24 DIAGNOSIS — E1129 Type 2 diabetes mellitus with other diabetic kidney complication: Secondary | ICD-10-CM | POA: Diagnosis not present

## 2018-03-24 DIAGNOSIS — N2581 Secondary hyperparathyroidism of renal origin: Secondary | ICD-10-CM | POA: Diagnosis not present

## 2018-03-24 DIAGNOSIS — N186 End stage renal disease: Secondary | ICD-10-CM | POA: Diagnosis not present

## 2018-03-26 DIAGNOSIS — N2581 Secondary hyperparathyroidism of renal origin: Secondary | ICD-10-CM | POA: Diagnosis not present

## 2018-03-26 DIAGNOSIS — D631 Anemia in chronic kidney disease: Secondary | ICD-10-CM | POA: Diagnosis not present

## 2018-03-26 DIAGNOSIS — N186 End stage renal disease: Secondary | ICD-10-CM | POA: Diagnosis not present

## 2018-03-26 DIAGNOSIS — E1129 Type 2 diabetes mellitus with other diabetic kidney complication: Secondary | ICD-10-CM | POA: Diagnosis not present

## 2018-03-26 DIAGNOSIS — D509 Iron deficiency anemia, unspecified: Secondary | ICD-10-CM | POA: Diagnosis not present

## 2018-03-29 DIAGNOSIS — N2581 Secondary hyperparathyroidism of renal origin: Secondary | ICD-10-CM | POA: Diagnosis not present

## 2018-03-29 DIAGNOSIS — E1129 Type 2 diabetes mellitus with other diabetic kidney complication: Secondary | ICD-10-CM | POA: Diagnosis not present

## 2018-03-29 DIAGNOSIS — D509 Iron deficiency anemia, unspecified: Secondary | ICD-10-CM | POA: Diagnosis not present

## 2018-03-29 DIAGNOSIS — N186 End stage renal disease: Secondary | ICD-10-CM | POA: Diagnosis not present

## 2018-03-29 DIAGNOSIS — D631 Anemia in chronic kidney disease: Secondary | ICD-10-CM | POA: Diagnosis not present

## 2018-03-31 DIAGNOSIS — E1129 Type 2 diabetes mellitus with other diabetic kidney complication: Secondary | ICD-10-CM | POA: Diagnosis not present

## 2018-03-31 DIAGNOSIS — D631 Anemia in chronic kidney disease: Secondary | ICD-10-CM | POA: Diagnosis not present

## 2018-03-31 DIAGNOSIS — N2581 Secondary hyperparathyroidism of renal origin: Secondary | ICD-10-CM | POA: Diagnosis not present

## 2018-03-31 DIAGNOSIS — N186 End stage renal disease: Secondary | ICD-10-CM | POA: Diagnosis not present

## 2018-03-31 DIAGNOSIS — D509 Iron deficiency anemia, unspecified: Secondary | ICD-10-CM | POA: Diagnosis not present

## 2018-04-02 DIAGNOSIS — D631 Anemia in chronic kidney disease: Secondary | ICD-10-CM | POA: Diagnosis not present

## 2018-04-02 DIAGNOSIS — D509 Iron deficiency anemia, unspecified: Secondary | ICD-10-CM | POA: Diagnosis not present

## 2018-04-02 DIAGNOSIS — N186 End stage renal disease: Secondary | ICD-10-CM | POA: Diagnosis not present

## 2018-04-02 DIAGNOSIS — E1129 Type 2 diabetes mellitus with other diabetic kidney complication: Secondary | ICD-10-CM | POA: Diagnosis not present

## 2018-04-02 DIAGNOSIS — N2581 Secondary hyperparathyroidism of renal origin: Secondary | ICD-10-CM | POA: Diagnosis not present

## 2018-04-05 DIAGNOSIS — D509 Iron deficiency anemia, unspecified: Secondary | ICD-10-CM | POA: Diagnosis not present

## 2018-04-05 DIAGNOSIS — N2581 Secondary hyperparathyroidism of renal origin: Secondary | ICD-10-CM | POA: Diagnosis not present

## 2018-04-05 DIAGNOSIS — E1129 Type 2 diabetes mellitus with other diabetic kidney complication: Secondary | ICD-10-CM | POA: Diagnosis not present

## 2018-04-05 DIAGNOSIS — D631 Anemia in chronic kidney disease: Secondary | ICD-10-CM | POA: Diagnosis not present

## 2018-04-05 DIAGNOSIS — N186 End stage renal disease: Secondary | ICD-10-CM | POA: Diagnosis not present

## 2018-04-07 DIAGNOSIS — N186 End stage renal disease: Secondary | ICD-10-CM | POA: Diagnosis not present

## 2018-04-07 DIAGNOSIS — N2581 Secondary hyperparathyroidism of renal origin: Secondary | ICD-10-CM | POA: Diagnosis not present

## 2018-04-07 DIAGNOSIS — E1129 Type 2 diabetes mellitus with other diabetic kidney complication: Secondary | ICD-10-CM | POA: Diagnosis not present

## 2018-04-07 DIAGNOSIS — D509 Iron deficiency anemia, unspecified: Secondary | ICD-10-CM | POA: Diagnosis not present

## 2018-04-07 DIAGNOSIS — D631 Anemia in chronic kidney disease: Secondary | ICD-10-CM | POA: Diagnosis not present

## 2018-04-09 DIAGNOSIS — N2581 Secondary hyperparathyroidism of renal origin: Secondary | ICD-10-CM | POA: Diagnosis not present

## 2018-04-09 DIAGNOSIS — D509 Iron deficiency anemia, unspecified: Secondary | ICD-10-CM | POA: Diagnosis not present

## 2018-04-09 DIAGNOSIS — N186 End stage renal disease: Secondary | ICD-10-CM | POA: Diagnosis not present

## 2018-04-09 DIAGNOSIS — D631 Anemia in chronic kidney disease: Secondary | ICD-10-CM | POA: Diagnosis not present

## 2018-04-09 DIAGNOSIS — E1129 Type 2 diabetes mellitus with other diabetic kidney complication: Secondary | ICD-10-CM | POA: Diagnosis not present

## 2018-04-12 DIAGNOSIS — N2581 Secondary hyperparathyroidism of renal origin: Secondary | ICD-10-CM | POA: Diagnosis not present

## 2018-04-12 DIAGNOSIS — D509 Iron deficiency anemia, unspecified: Secondary | ICD-10-CM | POA: Diagnosis not present

## 2018-04-12 DIAGNOSIS — D631 Anemia in chronic kidney disease: Secondary | ICD-10-CM | POA: Diagnosis not present

## 2018-04-12 DIAGNOSIS — N186 End stage renal disease: Secondary | ICD-10-CM | POA: Diagnosis not present

## 2018-04-12 DIAGNOSIS — E1129 Type 2 diabetes mellitus with other diabetic kidney complication: Secondary | ICD-10-CM | POA: Diagnosis not present

## 2018-04-14 DIAGNOSIS — E1129 Type 2 diabetes mellitus with other diabetic kidney complication: Secondary | ICD-10-CM | POA: Diagnosis not present

## 2018-04-14 DIAGNOSIS — N186 End stage renal disease: Secondary | ICD-10-CM | POA: Diagnosis not present

## 2018-04-14 DIAGNOSIS — N2581 Secondary hyperparathyroidism of renal origin: Secondary | ICD-10-CM | POA: Diagnosis not present

## 2018-04-14 DIAGNOSIS — D631 Anemia in chronic kidney disease: Secondary | ICD-10-CM | POA: Diagnosis not present

## 2018-04-14 DIAGNOSIS — D509 Iron deficiency anemia, unspecified: Secondary | ICD-10-CM | POA: Diagnosis not present

## 2018-04-16 DIAGNOSIS — Z992 Dependence on renal dialysis: Secondary | ICD-10-CM | POA: Diagnosis not present

## 2018-04-16 DIAGNOSIS — N2581 Secondary hyperparathyroidism of renal origin: Secondary | ICD-10-CM | POA: Diagnosis not present

## 2018-04-16 DIAGNOSIS — D631 Anemia in chronic kidney disease: Secondary | ICD-10-CM | POA: Diagnosis not present

## 2018-04-16 DIAGNOSIS — N186 End stage renal disease: Secondary | ICD-10-CM | POA: Diagnosis not present

## 2018-04-16 DIAGNOSIS — E1129 Type 2 diabetes mellitus with other diabetic kidney complication: Secondary | ICD-10-CM | POA: Diagnosis not present

## 2018-04-19 DIAGNOSIS — E1129 Type 2 diabetes mellitus with other diabetic kidney complication: Secondary | ICD-10-CM | POA: Diagnosis not present

## 2018-04-19 DIAGNOSIS — N186 End stage renal disease: Secondary | ICD-10-CM | POA: Diagnosis not present

## 2018-04-19 DIAGNOSIS — N2581 Secondary hyperparathyroidism of renal origin: Secondary | ICD-10-CM | POA: Diagnosis not present

## 2018-04-19 DIAGNOSIS — D631 Anemia in chronic kidney disease: Secondary | ICD-10-CM | POA: Diagnosis not present

## 2018-04-21 DIAGNOSIS — N186 End stage renal disease: Secondary | ICD-10-CM | POA: Diagnosis not present

## 2018-04-21 DIAGNOSIS — N2581 Secondary hyperparathyroidism of renal origin: Secondary | ICD-10-CM | POA: Diagnosis not present

## 2018-04-21 DIAGNOSIS — D631 Anemia in chronic kidney disease: Secondary | ICD-10-CM | POA: Diagnosis not present

## 2018-04-21 DIAGNOSIS — E1129 Type 2 diabetes mellitus with other diabetic kidney complication: Secondary | ICD-10-CM | POA: Diagnosis not present

## 2018-04-23 DIAGNOSIS — N2581 Secondary hyperparathyroidism of renal origin: Secondary | ICD-10-CM | POA: Diagnosis not present

## 2018-04-23 DIAGNOSIS — N186 End stage renal disease: Secondary | ICD-10-CM | POA: Diagnosis not present

## 2018-04-23 DIAGNOSIS — D631 Anemia in chronic kidney disease: Secondary | ICD-10-CM | POA: Diagnosis not present

## 2018-04-23 DIAGNOSIS — E1129 Type 2 diabetes mellitus with other diabetic kidney complication: Secondary | ICD-10-CM | POA: Diagnosis not present

## 2018-04-26 DIAGNOSIS — N186 End stage renal disease: Secondary | ICD-10-CM | POA: Diagnosis not present

## 2018-04-26 DIAGNOSIS — E1129 Type 2 diabetes mellitus with other diabetic kidney complication: Secondary | ICD-10-CM | POA: Diagnosis not present

## 2018-04-26 DIAGNOSIS — N2581 Secondary hyperparathyroidism of renal origin: Secondary | ICD-10-CM | POA: Diagnosis not present

## 2018-04-26 DIAGNOSIS — D631 Anemia in chronic kidney disease: Secondary | ICD-10-CM | POA: Diagnosis not present

## 2018-04-28 DIAGNOSIS — D631 Anemia in chronic kidney disease: Secondary | ICD-10-CM | POA: Diagnosis not present

## 2018-04-28 DIAGNOSIS — N186 End stage renal disease: Secondary | ICD-10-CM | POA: Diagnosis not present

## 2018-04-28 DIAGNOSIS — E1129 Type 2 diabetes mellitus with other diabetic kidney complication: Secondary | ICD-10-CM | POA: Diagnosis not present

## 2018-04-28 DIAGNOSIS — N2581 Secondary hyperparathyroidism of renal origin: Secondary | ICD-10-CM | POA: Diagnosis not present

## 2018-04-30 DIAGNOSIS — E1129 Type 2 diabetes mellitus with other diabetic kidney complication: Secondary | ICD-10-CM | POA: Diagnosis not present

## 2018-04-30 DIAGNOSIS — N2581 Secondary hyperparathyroidism of renal origin: Secondary | ICD-10-CM | POA: Diagnosis not present

## 2018-04-30 DIAGNOSIS — D631 Anemia in chronic kidney disease: Secondary | ICD-10-CM | POA: Diagnosis not present

## 2018-04-30 DIAGNOSIS — N186 End stage renal disease: Secondary | ICD-10-CM | POA: Diagnosis not present

## 2018-05-03 DIAGNOSIS — E1129 Type 2 diabetes mellitus with other diabetic kidney complication: Secondary | ICD-10-CM | POA: Diagnosis not present

## 2018-05-03 DIAGNOSIS — D631 Anemia in chronic kidney disease: Secondary | ICD-10-CM | POA: Diagnosis not present

## 2018-05-03 DIAGNOSIS — N186 End stage renal disease: Secondary | ICD-10-CM | POA: Diagnosis not present

## 2018-05-03 DIAGNOSIS — N2581 Secondary hyperparathyroidism of renal origin: Secondary | ICD-10-CM | POA: Diagnosis not present

## 2018-05-05 DIAGNOSIS — N186 End stage renal disease: Secondary | ICD-10-CM | POA: Diagnosis not present

## 2018-05-05 DIAGNOSIS — D631 Anemia in chronic kidney disease: Secondary | ICD-10-CM | POA: Diagnosis not present

## 2018-05-05 DIAGNOSIS — E1129 Type 2 diabetes mellitus with other diabetic kidney complication: Secondary | ICD-10-CM | POA: Diagnosis not present

## 2018-05-05 DIAGNOSIS — N2581 Secondary hyperparathyroidism of renal origin: Secondary | ICD-10-CM | POA: Diagnosis not present

## 2018-05-07 DIAGNOSIS — D631 Anemia in chronic kidney disease: Secondary | ICD-10-CM | POA: Diagnosis not present

## 2018-05-07 DIAGNOSIS — E1129 Type 2 diabetes mellitus with other diabetic kidney complication: Secondary | ICD-10-CM | POA: Diagnosis not present

## 2018-05-07 DIAGNOSIS — N2581 Secondary hyperparathyroidism of renal origin: Secondary | ICD-10-CM | POA: Diagnosis not present

## 2018-05-07 DIAGNOSIS — N186 End stage renal disease: Secondary | ICD-10-CM | POA: Diagnosis not present

## 2018-05-10 DIAGNOSIS — E1129 Type 2 diabetes mellitus with other diabetic kidney complication: Secondary | ICD-10-CM | POA: Diagnosis not present

## 2018-05-10 DIAGNOSIS — N186 End stage renal disease: Secondary | ICD-10-CM | POA: Diagnosis not present

## 2018-05-10 DIAGNOSIS — N2581 Secondary hyperparathyroidism of renal origin: Secondary | ICD-10-CM | POA: Diagnosis not present

## 2018-05-10 DIAGNOSIS — D631 Anemia in chronic kidney disease: Secondary | ICD-10-CM | POA: Diagnosis not present

## 2018-05-12 DIAGNOSIS — N2581 Secondary hyperparathyroidism of renal origin: Secondary | ICD-10-CM | POA: Diagnosis not present

## 2018-05-12 DIAGNOSIS — E1129 Type 2 diabetes mellitus with other diabetic kidney complication: Secondary | ICD-10-CM | POA: Diagnosis not present

## 2018-05-12 DIAGNOSIS — D631 Anemia in chronic kidney disease: Secondary | ICD-10-CM | POA: Diagnosis not present

## 2018-05-12 DIAGNOSIS — N186 End stage renal disease: Secondary | ICD-10-CM | POA: Diagnosis not present

## 2018-05-14 DIAGNOSIS — D631 Anemia in chronic kidney disease: Secondary | ICD-10-CM | POA: Diagnosis not present

## 2018-05-14 DIAGNOSIS — N2581 Secondary hyperparathyroidism of renal origin: Secondary | ICD-10-CM | POA: Diagnosis not present

## 2018-05-14 DIAGNOSIS — E1129 Type 2 diabetes mellitus with other diabetic kidney complication: Secondary | ICD-10-CM | POA: Diagnosis not present

## 2018-05-14 DIAGNOSIS — N186 End stage renal disease: Secondary | ICD-10-CM | POA: Diagnosis not present

## 2018-05-16 DIAGNOSIS — E1129 Type 2 diabetes mellitus with other diabetic kidney complication: Secondary | ICD-10-CM | POA: Diagnosis not present

## 2018-05-16 DIAGNOSIS — Z992 Dependence on renal dialysis: Secondary | ICD-10-CM | POA: Diagnosis not present

## 2018-05-16 DIAGNOSIS — N186 End stage renal disease: Secondary | ICD-10-CM | POA: Diagnosis not present

## 2018-05-17 DIAGNOSIS — N2581 Secondary hyperparathyroidism of renal origin: Secondary | ICD-10-CM | POA: Diagnosis not present

## 2018-05-17 DIAGNOSIS — D631 Anemia in chronic kidney disease: Secondary | ICD-10-CM | POA: Diagnosis not present

## 2018-05-17 DIAGNOSIS — N186 End stage renal disease: Secondary | ICD-10-CM | POA: Diagnosis not present

## 2018-05-17 DIAGNOSIS — E1129 Type 2 diabetes mellitus with other diabetic kidney complication: Secondary | ICD-10-CM | POA: Diagnosis not present

## 2018-05-21 DIAGNOSIS — E1129 Type 2 diabetes mellitus with other diabetic kidney complication: Secondary | ICD-10-CM | POA: Diagnosis not present

## 2018-05-21 DIAGNOSIS — N186 End stage renal disease: Secondary | ICD-10-CM | POA: Diagnosis not present

## 2018-05-21 DIAGNOSIS — N2581 Secondary hyperparathyroidism of renal origin: Secondary | ICD-10-CM | POA: Diagnosis not present

## 2018-05-21 DIAGNOSIS — D631 Anemia in chronic kidney disease: Secondary | ICD-10-CM | POA: Diagnosis not present

## 2018-05-24 DIAGNOSIS — D631 Anemia in chronic kidney disease: Secondary | ICD-10-CM | POA: Diagnosis not present

## 2018-05-24 DIAGNOSIS — N2581 Secondary hyperparathyroidism of renal origin: Secondary | ICD-10-CM | POA: Diagnosis not present

## 2018-05-24 DIAGNOSIS — E1129 Type 2 diabetes mellitus with other diabetic kidney complication: Secondary | ICD-10-CM | POA: Diagnosis not present

## 2018-05-24 DIAGNOSIS — N186 End stage renal disease: Secondary | ICD-10-CM | POA: Diagnosis not present

## 2018-05-26 DIAGNOSIS — D631 Anemia in chronic kidney disease: Secondary | ICD-10-CM | POA: Diagnosis not present

## 2018-05-26 DIAGNOSIS — E1129 Type 2 diabetes mellitus with other diabetic kidney complication: Secondary | ICD-10-CM | POA: Diagnosis not present

## 2018-05-26 DIAGNOSIS — N186 End stage renal disease: Secondary | ICD-10-CM | POA: Diagnosis not present

## 2018-05-26 DIAGNOSIS — N2581 Secondary hyperparathyroidism of renal origin: Secondary | ICD-10-CM | POA: Diagnosis not present

## 2018-05-28 DIAGNOSIS — D631 Anemia in chronic kidney disease: Secondary | ICD-10-CM | POA: Diagnosis not present

## 2018-05-28 DIAGNOSIS — N2581 Secondary hyperparathyroidism of renal origin: Secondary | ICD-10-CM | POA: Diagnosis not present

## 2018-05-28 DIAGNOSIS — N186 End stage renal disease: Secondary | ICD-10-CM | POA: Diagnosis not present

## 2018-05-28 DIAGNOSIS — E1129 Type 2 diabetes mellitus with other diabetic kidney complication: Secondary | ICD-10-CM | POA: Diagnosis not present

## 2018-05-31 DIAGNOSIS — D631 Anemia in chronic kidney disease: Secondary | ICD-10-CM | POA: Diagnosis not present

## 2018-05-31 DIAGNOSIS — N186 End stage renal disease: Secondary | ICD-10-CM | POA: Diagnosis not present

## 2018-05-31 DIAGNOSIS — N2581 Secondary hyperparathyroidism of renal origin: Secondary | ICD-10-CM | POA: Diagnosis not present

## 2018-05-31 DIAGNOSIS — E1129 Type 2 diabetes mellitus with other diabetic kidney complication: Secondary | ICD-10-CM | POA: Diagnosis not present

## 2018-06-02 DIAGNOSIS — D631 Anemia in chronic kidney disease: Secondary | ICD-10-CM | POA: Diagnosis not present

## 2018-06-02 DIAGNOSIS — N2581 Secondary hyperparathyroidism of renal origin: Secondary | ICD-10-CM | POA: Diagnosis not present

## 2018-06-02 DIAGNOSIS — N186 End stage renal disease: Secondary | ICD-10-CM | POA: Diagnosis not present

## 2018-06-02 DIAGNOSIS — E1129 Type 2 diabetes mellitus with other diabetic kidney complication: Secondary | ICD-10-CM | POA: Diagnosis not present

## 2018-06-04 DIAGNOSIS — D631 Anemia in chronic kidney disease: Secondary | ICD-10-CM | POA: Diagnosis not present

## 2018-06-04 DIAGNOSIS — N186 End stage renal disease: Secondary | ICD-10-CM | POA: Diagnosis not present

## 2018-06-04 DIAGNOSIS — N2581 Secondary hyperparathyroidism of renal origin: Secondary | ICD-10-CM | POA: Diagnosis not present

## 2018-06-04 DIAGNOSIS — E1129 Type 2 diabetes mellitus with other diabetic kidney complication: Secondary | ICD-10-CM | POA: Diagnosis not present

## 2018-06-06 DIAGNOSIS — Z89512 Acquired absence of left leg below knee: Secondary | ICD-10-CM | POA: Diagnosis not present

## 2018-06-06 DIAGNOSIS — Z89511 Acquired absence of right leg below knee: Secondary | ICD-10-CM | POA: Diagnosis not present

## 2018-06-06 DIAGNOSIS — I10 Essential (primary) hypertension: Secondary | ICD-10-CM | POA: Diagnosis not present

## 2018-06-06 DIAGNOSIS — Z0000 Encounter for general adult medical examination without abnormal findings: Secondary | ICD-10-CM | POA: Diagnosis not present

## 2018-06-06 DIAGNOSIS — Z794 Long term (current) use of insulin: Secondary | ICD-10-CM | POA: Diagnosis not present

## 2018-06-06 DIAGNOSIS — G546 Phantom limb syndrome with pain: Secondary | ICD-10-CM | POA: Diagnosis not present

## 2018-06-06 DIAGNOSIS — D696 Thrombocytopenia, unspecified: Secondary | ICD-10-CM | POA: Diagnosis not present

## 2018-06-06 DIAGNOSIS — E782 Mixed hyperlipidemia: Secondary | ICD-10-CM | POA: Diagnosis not present

## 2018-06-06 DIAGNOSIS — Z23 Encounter for immunization: Secondary | ICD-10-CM | POA: Diagnosis not present

## 2018-06-06 DIAGNOSIS — E118 Type 2 diabetes mellitus with unspecified complications: Secondary | ICD-10-CM | POA: Diagnosis not present

## 2018-06-06 DIAGNOSIS — E43 Unspecified severe protein-calorie malnutrition: Secondary | ICD-10-CM | POA: Diagnosis not present

## 2018-06-07 DIAGNOSIS — D631 Anemia in chronic kidney disease: Secondary | ICD-10-CM | POA: Diagnosis not present

## 2018-06-07 DIAGNOSIS — E1129 Type 2 diabetes mellitus with other diabetic kidney complication: Secondary | ICD-10-CM | POA: Diagnosis not present

## 2018-06-07 DIAGNOSIS — N2581 Secondary hyperparathyroidism of renal origin: Secondary | ICD-10-CM | POA: Diagnosis not present

## 2018-06-07 DIAGNOSIS — N186 End stage renal disease: Secondary | ICD-10-CM | POA: Diagnosis not present

## 2018-06-08 DIAGNOSIS — E782 Mixed hyperlipidemia: Secondary | ICD-10-CM | POA: Diagnosis not present

## 2018-06-09 DIAGNOSIS — N2581 Secondary hyperparathyroidism of renal origin: Secondary | ICD-10-CM | POA: Diagnosis not present

## 2018-06-09 DIAGNOSIS — D631 Anemia in chronic kidney disease: Secondary | ICD-10-CM | POA: Diagnosis not present

## 2018-06-09 DIAGNOSIS — N186 End stage renal disease: Secondary | ICD-10-CM | POA: Diagnosis not present

## 2018-06-09 DIAGNOSIS — E1129 Type 2 diabetes mellitus with other diabetic kidney complication: Secondary | ICD-10-CM | POA: Diagnosis not present

## 2018-06-11 DIAGNOSIS — E1129 Type 2 diabetes mellitus with other diabetic kidney complication: Secondary | ICD-10-CM | POA: Diagnosis not present

## 2018-06-11 DIAGNOSIS — D631 Anemia in chronic kidney disease: Secondary | ICD-10-CM | POA: Diagnosis not present

## 2018-06-11 DIAGNOSIS — N2581 Secondary hyperparathyroidism of renal origin: Secondary | ICD-10-CM | POA: Diagnosis not present

## 2018-06-11 DIAGNOSIS — N186 End stage renal disease: Secondary | ICD-10-CM | POA: Diagnosis not present

## 2018-06-14 DIAGNOSIS — N2581 Secondary hyperparathyroidism of renal origin: Secondary | ICD-10-CM | POA: Diagnosis not present

## 2018-06-14 DIAGNOSIS — D631 Anemia in chronic kidney disease: Secondary | ICD-10-CM | POA: Diagnosis not present

## 2018-06-14 DIAGNOSIS — E1129 Type 2 diabetes mellitus with other diabetic kidney complication: Secondary | ICD-10-CM | POA: Diagnosis not present

## 2018-06-14 DIAGNOSIS — N186 End stage renal disease: Secondary | ICD-10-CM | POA: Diagnosis not present

## 2018-06-16 DIAGNOSIS — E1129 Type 2 diabetes mellitus with other diabetic kidney complication: Secondary | ICD-10-CM | POA: Diagnosis not present

## 2018-06-16 DIAGNOSIS — G546 Phantom limb syndrome with pain: Secondary | ICD-10-CM | POA: Diagnosis not present

## 2018-06-16 DIAGNOSIS — D509 Iron deficiency anemia, unspecified: Secondary | ICD-10-CM | POA: Diagnosis not present

## 2018-06-16 DIAGNOSIS — D631 Anemia in chronic kidney disease: Secondary | ICD-10-CM | POA: Diagnosis not present

## 2018-06-16 DIAGNOSIS — E1140 Type 2 diabetes mellitus with diabetic neuropathy, unspecified: Secondary | ICD-10-CM | POA: Diagnosis not present

## 2018-06-16 DIAGNOSIS — E1151 Type 2 diabetes mellitus with diabetic peripheral angiopathy without gangrene: Secondary | ICD-10-CM | POA: Diagnosis not present

## 2018-06-16 DIAGNOSIS — N2581 Secondary hyperparathyroidism of renal origin: Secondary | ICD-10-CM | POA: Diagnosis not present

## 2018-06-16 DIAGNOSIS — E1122 Type 2 diabetes mellitus with diabetic chronic kidney disease: Secondary | ICD-10-CM | POA: Diagnosis not present

## 2018-06-16 DIAGNOSIS — N186 End stage renal disease: Secondary | ICD-10-CM | POA: Diagnosis not present

## 2018-06-16 DIAGNOSIS — Z992 Dependence on renal dialysis: Secondary | ICD-10-CM | POA: Diagnosis not present

## 2018-06-16 DIAGNOSIS — I120 Hypertensive chronic kidney disease with stage 5 chronic kidney disease or end stage renal disease: Secondary | ICD-10-CM | POA: Diagnosis not present

## 2018-06-17 ENCOUNTER — Other Ambulatory Visit: Payer: Self-pay

## 2018-06-17 DIAGNOSIS — Z794 Long term (current) use of insulin: Secondary | ICD-10-CM | POA: Diagnosis not present

## 2018-06-17 DIAGNOSIS — Z7902 Long term (current) use of antithrombotics/antiplatelets: Secondary | ICD-10-CM | POA: Diagnosis not present

## 2018-06-17 DIAGNOSIS — Z992 Dependence on renal dialysis: Secondary | ICD-10-CM | POA: Diagnosis not present

## 2018-06-17 DIAGNOSIS — N186 End stage renal disease: Secondary | ICD-10-CM | POA: Diagnosis not present

## 2018-06-17 DIAGNOSIS — J449 Chronic obstructive pulmonary disease, unspecified: Secondary | ICD-10-CM | POA: Diagnosis not present

## 2018-06-17 DIAGNOSIS — E1151 Type 2 diabetes mellitus with diabetic peripheral angiopathy without gangrene: Secondary | ICD-10-CM | POA: Diagnosis not present

## 2018-06-17 DIAGNOSIS — I120 Hypertensive chronic kidney disease with stage 5 chronic kidney disease or end stage renal disease: Secondary | ICD-10-CM | POA: Diagnosis not present

## 2018-06-17 DIAGNOSIS — Z89512 Acquired absence of left leg below knee: Secondary | ICD-10-CM | POA: Diagnosis not present

## 2018-06-17 DIAGNOSIS — G546 Phantom limb syndrome with pain: Secondary | ICD-10-CM | POA: Diagnosis not present

## 2018-06-17 DIAGNOSIS — E1140 Type 2 diabetes mellitus with diabetic neuropathy, unspecified: Secondary | ICD-10-CM | POA: Diagnosis not present

## 2018-06-17 DIAGNOSIS — F17210 Nicotine dependence, cigarettes, uncomplicated: Secondary | ICD-10-CM | POA: Diagnosis not present

## 2018-06-17 DIAGNOSIS — E1122 Type 2 diabetes mellitus with diabetic chronic kidney disease: Secondary | ICD-10-CM | POA: Diagnosis not present

## 2018-06-17 DIAGNOSIS — Z89511 Acquired absence of right leg below knee: Secondary | ICD-10-CM | POA: Diagnosis not present

## 2018-06-18 DIAGNOSIS — D509 Iron deficiency anemia, unspecified: Secondary | ICD-10-CM | POA: Diagnosis not present

## 2018-06-18 DIAGNOSIS — N2581 Secondary hyperparathyroidism of renal origin: Secondary | ICD-10-CM | POA: Diagnosis not present

## 2018-06-18 DIAGNOSIS — D631 Anemia in chronic kidney disease: Secondary | ICD-10-CM | POA: Diagnosis not present

## 2018-06-18 DIAGNOSIS — N186 End stage renal disease: Secondary | ICD-10-CM | POA: Diagnosis not present

## 2018-06-18 DIAGNOSIS — E1129 Type 2 diabetes mellitus with other diabetic kidney complication: Secondary | ICD-10-CM | POA: Diagnosis not present

## 2018-06-20 DIAGNOSIS — E1151 Type 2 diabetes mellitus with diabetic peripheral angiopathy without gangrene: Secondary | ICD-10-CM | POA: Diagnosis not present

## 2018-06-20 DIAGNOSIS — I120 Hypertensive chronic kidney disease with stage 5 chronic kidney disease or end stage renal disease: Secondary | ICD-10-CM | POA: Diagnosis not present

## 2018-06-20 DIAGNOSIS — E1140 Type 2 diabetes mellitus with diabetic neuropathy, unspecified: Secondary | ICD-10-CM | POA: Diagnosis not present

## 2018-06-20 DIAGNOSIS — E1122 Type 2 diabetes mellitus with diabetic chronic kidney disease: Secondary | ICD-10-CM | POA: Diagnosis not present

## 2018-06-20 DIAGNOSIS — N186 End stage renal disease: Secondary | ICD-10-CM | POA: Diagnosis not present

## 2018-06-20 DIAGNOSIS — G546 Phantom limb syndrome with pain: Secondary | ICD-10-CM | POA: Diagnosis not present

## 2018-06-21 DIAGNOSIS — D631 Anemia in chronic kidney disease: Secondary | ICD-10-CM | POA: Diagnosis not present

## 2018-06-21 DIAGNOSIS — E1129 Type 2 diabetes mellitus with other diabetic kidney complication: Secondary | ICD-10-CM | POA: Diagnosis not present

## 2018-06-21 DIAGNOSIS — N2581 Secondary hyperparathyroidism of renal origin: Secondary | ICD-10-CM | POA: Diagnosis not present

## 2018-06-21 DIAGNOSIS — D509 Iron deficiency anemia, unspecified: Secondary | ICD-10-CM | POA: Diagnosis not present

## 2018-06-21 DIAGNOSIS — N186 End stage renal disease: Secondary | ICD-10-CM | POA: Diagnosis not present

## 2018-06-23 DIAGNOSIS — D509 Iron deficiency anemia, unspecified: Secondary | ICD-10-CM | POA: Diagnosis not present

## 2018-06-23 DIAGNOSIS — N2581 Secondary hyperparathyroidism of renal origin: Secondary | ICD-10-CM | POA: Diagnosis not present

## 2018-06-23 DIAGNOSIS — D631 Anemia in chronic kidney disease: Secondary | ICD-10-CM | POA: Diagnosis not present

## 2018-06-23 DIAGNOSIS — N186 End stage renal disease: Secondary | ICD-10-CM | POA: Diagnosis not present

## 2018-06-23 DIAGNOSIS — E1129 Type 2 diabetes mellitus with other diabetic kidney complication: Secondary | ICD-10-CM | POA: Diagnosis not present

## 2018-06-25 DIAGNOSIS — N2581 Secondary hyperparathyroidism of renal origin: Secondary | ICD-10-CM | POA: Diagnosis not present

## 2018-06-25 DIAGNOSIS — D631 Anemia in chronic kidney disease: Secondary | ICD-10-CM | POA: Diagnosis not present

## 2018-06-25 DIAGNOSIS — D509 Iron deficiency anemia, unspecified: Secondary | ICD-10-CM | POA: Diagnosis not present

## 2018-06-25 DIAGNOSIS — N186 End stage renal disease: Secondary | ICD-10-CM | POA: Diagnosis not present

## 2018-06-25 DIAGNOSIS — E1129 Type 2 diabetes mellitus with other diabetic kidney complication: Secondary | ICD-10-CM | POA: Diagnosis not present

## 2018-06-27 DIAGNOSIS — N186 End stage renal disease: Secondary | ICD-10-CM | POA: Diagnosis not present

## 2018-06-27 DIAGNOSIS — E1151 Type 2 diabetes mellitus with diabetic peripheral angiopathy without gangrene: Secondary | ICD-10-CM | POA: Diagnosis not present

## 2018-06-27 DIAGNOSIS — I120 Hypertensive chronic kidney disease with stage 5 chronic kidney disease or end stage renal disease: Secondary | ICD-10-CM | POA: Diagnosis not present

## 2018-06-27 DIAGNOSIS — G546 Phantom limb syndrome with pain: Secondary | ICD-10-CM | POA: Diagnosis not present

## 2018-06-27 DIAGNOSIS — E1122 Type 2 diabetes mellitus with diabetic chronic kidney disease: Secondary | ICD-10-CM | POA: Diagnosis not present

## 2018-06-27 DIAGNOSIS — E1140 Type 2 diabetes mellitus with diabetic neuropathy, unspecified: Secondary | ICD-10-CM | POA: Diagnosis not present

## 2018-06-28 DIAGNOSIS — E1129 Type 2 diabetes mellitus with other diabetic kidney complication: Secondary | ICD-10-CM | POA: Diagnosis not present

## 2018-06-28 DIAGNOSIS — N2581 Secondary hyperparathyroidism of renal origin: Secondary | ICD-10-CM | POA: Diagnosis not present

## 2018-06-28 DIAGNOSIS — N186 End stage renal disease: Secondary | ICD-10-CM | POA: Diagnosis not present

## 2018-06-28 DIAGNOSIS — D509 Iron deficiency anemia, unspecified: Secondary | ICD-10-CM | POA: Diagnosis not present

## 2018-06-28 DIAGNOSIS — D631 Anemia in chronic kidney disease: Secondary | ICD-10-CM | POA: Diagnosis not present

## 2018-06-30 DIAGNOSIS — N2581 Secondary hyperparathyroidism of renal origin: Secondary | ICD-10-CM | POA: Diagnosis not present

## 2018-06-30 DIAGNOSIS — D631 Anemia in chronic kidney disease: Secondary | ICD-10-CM | POA: Diagnosis not present

## 2018-06-30 DIAGNOSIS — E1129 Type 2 diabetes mellitus with other diabetic kidney complication: Secondary | ICD-10-CM | POA: Diagnosis not present

## 2018-06-30 DIAGNOSIS — N186 End stage renal disease: Secondary | ICD-10-CM | POA: Diagnosis not present

## 2018-06-30 DIAGNOSIS — D509 Iron deficiency anemia, unspecified: Secondary | ICD-10-CM | POA: Diagnosis not present

## 2018-07-02 DIAGNOSIS — E1129 Type 2 diabetes mellitus with other diabetic kidney complication: Secondary | ICD-10-CM | POA: Diagnosis not present

## 2018-07-02 DIAGNOSIS — D509 Iron deficiency anemia, unspecified: Secondary | ICD-10-CM | POA: Diagnosis not present

## 2018-07-02 DIAGNOSIS — D631 Anemia in chronic kidney disease: Secondary | ICD-10-CM | POA: Diagnosis not present

## 2018-07-02 DIAGNOSIS — N186 End stage renal disease: Secondary | ICD-10-CM | POA: Diagnosis not present

## 2018-07-02 DIAGNOSIS — N2581 Secondary hyperparathyroidism of renal origin: Secondary | ICD-10-CM | POA: Diagnosis not present

## 2018-07-04 DIAGNOSIS — H2512 Age-related nuclear cataract, left eye: Secondary | ICD-10-CM | POA: Diagnosis not present

## 2018-07-04 DIAGNOSIS — H26491 Other secondary cataract, right eye: Secondary | ICD-10-CM | POA: Diagnosis not present

## 2018-07-04 DIAGNOSIS — H40032 Anatomical narrow angle, left eye: Secondary | ICD-10-CM | POA: Diagnosis not present

## 2018-07-04 DIAGNOSIS — E113291 Type 2 diabetes mellitus with mild nonproliferative diabetic retinopathy without macular edema, right eye: Secondary | ICD-10-CM | POA: Diagnosis not present

## 2018-07-05 DIAGNOSIS — D631 Anemia in chronic kidney disease: Secondary | ICD-10-CM | POA: Diagnosis not present

## 2018-07-05 DIAGNOSIS — E1129 Type 2 diabetes mellitus with other diabetic kidney complication: Secondary | ICD-10-CM | POA: Diagnosis not present

## 2018-07-05 DIAGNOSIS — N186 End stage renal disease: Secondary | ICD-10-CM | POA: Diagnosis not present

## 2018-07-05 DIAGNOSIS — N2581 Secondary hyperparathyroidism of renal origin: Secondary | ICD-10-CM | POA: Diagnosis not present

## 2018-07-05 DIAGNOSIS — D509 Iron deficiency anemia, unspecified: Secondary | ICD-10-CM | POA: Diagnosis not present

## 2018-07-07 DIAGNOSIS — D509 Iron deficiency anemia, unspecified: Secondary | ICD-10-CM | POA: Diagnosis not present

## 2018-07-07 DIAGNOSIS — N186 End stage renal disease: Secondary | ICD-10-CM | POA: Diagnosis not present

## 2018-07-07 DIAGNOSIS — D631 Anemia in chronic kidney disease: Secondary | ICD-10-CM | POA: Diagnosis not present

## 2018-07-07 DIAGNOSIS — E1129 Type 2 diabetes mellitus with other diabetic kidney complication: Secondary | ICD-10-CM | POA: Diagnosis not present

## 2018-07-07 DIAGNOSIS — N2581 Secondary hyperparathyroidism of renal origin: Secondary | ICD-10-CM | POA: Diagnosis not present

## 2018-07-08 DIAGNOSIS — I120 Hypertensive chronic kidney disease with stage 5 chronic kidney disease or end stage renal disease: Secondary | ICD-10-CM | POA: Diagnosis not present

## 2018-07-08 DIAGNOSIS — E1140 Type 2 diabetes mellitus with diabetic neuropathy, unspecified: Secondary | ICD-10-CM | POA: Diagnosis not present

## 2018-07-08 DIAGNOSIS — E1151 Type 2 diabetes mellitus with diabetic peripheral angiopathy without gangrene: Secondary | ICD-10-CM | POA: Diagnosis not present

## 2018-07-08 DIAGNOSIS — N186 End stage renal disease: Secondary | ICD-10-CM | POA: Diagnosis not present

## 2018-07-08 DIAGNOSIS — G546 Phantom limb syndrome with pain: Secondary | ICD-10-CM | POA: Diagnosis not present

## 2018-07-08 DIAGNOSIS — E1122 Type 2 diabetes mellitus with diabetic chronic kidney disease: Secondary | ICD-10-CM | POA: Diagnosis not present

## 2018-07-09 DIAGNOSIS — D631 Anemia in chronic kidney disease: Secondary | ICD-10-CM | POA: Diagnosis not present

## 2018-07-09 DIAGNOSIS — N186 End stage renal disease: Secondary | ICD-10-CM | POA: Diagnosis not present

## 2018-07-09 DIAGNOSIS — D509 Iron deficiency anemia, unspecified: Secondary | ICD-10-CM | POA: Diagnosis not present

## 2018-07-09 DIAGNOSIS — E1129 Type 2 diabetes mellitus with other diabetic kidney complication: Secondary | ICD-10-CM | POA: Diagnosis not present

## 2018-07-09 DIAGNOSIS — N2581 Secondary hyperparathyroidism of renal origin: Secondary | ICD-10-CM | POA: Diagnosis not present

## 2018-07-11 DIAGNOSIS — I120 Hypertensive chronic kidney disease with stage 5 chronic kidney disease or end stage renal disease: Secondary | ICD-10-CM | POA: Diagnosis not present

## 2018-07-11 DIAGNOSIS — E1151 Type 2 diabetes mellitus with diabetic peripheral angiopathy without gangrene: Secondary | ICD-10-CM | POA: Diagnosis not present

## 2018-07-11 DIAGNOSIS — G546 Phantom limb syndrome with pain: Secondary | ICD-10-CM | POA: Diagnosis not present

## 2018-07-11 DIAGNOSIS — E1140 Type 2 diabetes mellitus with diabetic neuropathy, unspecified: Secondary | ICD-10-CM | POA: Diagnosis not present

## 2018-07-11 DIAGNOSIS — E1122 Type 2 diabetes mellitus with diabetic chronic kidney disease: Secondary | ICD-10-CM | POA: Diagnosis not present

## 2018-07-11 DIAGNOSIS — N186 End stage renal disease: Secondary | ICD-10-CM | POA: Diagnosis not present

## 2018-07-12 DIAGNOSIS — N2581 Secondary hyperparathyroidism of renal origin: Secondary | ICD-10-CM | POA: Diagnosis not present

## 2018-07-12 DIAGNOSIS — D631 Anemia in chronic kidney disease: Secondary | ICD-10-CM | POA: Diagnosis not present

## 2018-07-12 DIAGNOSIS — N186 End stage renal disease: Secondary | ICD-10-CM | POA: Diagnosis not present

## 2018-07-12 DIAGNOSIS — E1129 Type 2 diabetes mellitus with other diabetic kidney complication: Secondary | ICD-10-CM | POA: Diagnosis not present

## 2018-07-12 DIAGNOSIS — D509 Iron deficiency anemia, unspecified: Secondary | ICD-10-CM | POA: Diagnosis not present

## 2018-07-14 DIAGNOSIS — N2581 Secondary hyperparathyroidism of renal origin: Secondary | ICD-10-CM | POA: Diagnosis not present

## 2018-07-14 DIAGNOSIS — D631 Anemia in chronic kidney disease: Secondary | ICD-10-CM | POA: Diagnosis not present

## 2018-07-14 DIAGNOSIS — D509 Iron deficiency anemia, unspecified: Secondary | ICD-10-CM | POA: Diagnosis not present

## 2018-07-14 DIAGNOSIS — E1129 Type 2 diabetes mellitus with other diabetic kidney complication: Secondary | ICD-10-CM | POA: Diagnosis not present

## 2018-07-14 DIAGNOSIS — N186 End stage renal disease: Secondary | ICD-10-CM | POA: Diagnosis not present

## 2018-07-16 DIAGNOSIS — D631 Anemia in chronic kidney disease: Secondary | ICD-10-CM | POA: Diagnosis not present

## 2018-07-16 DIAGNOSIS — E1129 Type 2 diabetes mellitus with other diabetic kidney complication: Secondary | ICD-10-CM | POA: Diagnosis not present

## 2018-07-16 DIAGNOSIS — D509 Iron deficiency anemia, unspecified: Secondary | ICD-10-CM | POA: Diagnosis not present

## 2018-07-16 DIAGNOSIS — N186 End stage renal disease: Secondary | ICD-10-CM | POA: Diagnosis not present

## 2018-07-16 DIAGNOSIS — N2581 Secondary hyperparathyroidism of renal origin: Secondary | ICD-10-CM | POA: Diagnosis not present

## 2018-07-17 DIAGNOSIS — E1129 Type 2 diabetes mellitus with other diabetic kidney complication: Secondary | ICD-10-CM | POA: Diagnosis not present

## 2018-07-17 DIAGNOSIS — Z992 Dependence on renal dialysis: Secondary | ICD-10-CM | POA: Diagnosis not present

## 2018-07-17 DIAGNOSIS — N186 End stage renal disease: Secondary | ICD-10-CM | POA: Diagnosis not present

## 2018-07-19 DIAGNOSIS — N2581 Secondary hyperparathyroidism of renal origin: Secondary | ICD-10-CM | POA: Diagnosis not present

## 2018-07-19 DIAGNOSIS — D509 Iron deficiency anemia, unspecified: Secondary | ICD-10-CM | POA: Diagnosis not present

## 2018-07-19 DIAGNOSIS — E1129 Type 2 diabetes mellitus with other diabetic kidney complication: Secondary | ICD-10-CM | POA: Diagnosis not present

## 2018-07-19 DIAGNOSIS — N186 End stage renal disease: Secondary | ICD-10-CM | POA: Diagnosis not present

## 2018-07-19 DIAGNOSIS — D631 Anemia in chronic kidney disease: Secondary | ICD-10-CM | POA: Diagnosis not present

## 2018-07-20 DIAGNOSIS — I120 Hypertensive chronic kidney disease with stage 5 chronic kidney disease or end stage renal disease: Secondary | ICD-10-CM | POA: Diagnosis not present

## 2018-07-20 DIAGNOSIS — N186 End stage renal disease: Secondary | ICD-10-CM | POA: Diagnosis not present

## 2018-07-20 DIAGNOSIS — E1151 Type 2 diabetes mellitus with diabetic peripheral angiopathy without gangrene: Secondary | ICD-10-CM | POA: Diagnosis not present

## 2018-07-20 DIAGNOSIS — E1122 Type 2 diabetes mellitus with diabetic chronic kidney disease: Secondary | ICD-10-CM | POA: Diagnosis not present

## 2018-07-20 DIAGNOSIS — E1140 Type 2 diabetes mellitus with diabetic neuropathy, unspecified: Secondary | ICD-10-CM | POA: Diagnosis not present

## 2018-07-20 DIAGNOSIS — G546 Phantom limb syndrome with pain: Secondary | ICD-10-CM | POA: Diagnosis not present

## 2018-07-21 DIAGNOSIS — N186 End stage renal disease: Secondary | ICD-10-CM | POA: Diagnosis not present

## 2018-07-21 DIAGNOSIS — E1129 Type 2 diabetes mellitus with other diabetic kidney complication: Secondary | ICD-10-CM | POA: Diagnosis not present

## 2018-07-21 DIAGNOSIS — D509 Iron deficiency anemia, unspecified: Secondary | ICD-10-CM | POA: Diagnosis not present

## 2018-07-21 DIAGNOSIS — N2581 Secondary hyperparathyroidism of renal origin: Secondary | ICD-10-CM | POA: Diagnosis not present

## 2018-07-21 DIAGNOSIS — D631 Anemia in chronic kidney disease: Secondary | ICD-10-CM | POA: Diagnosis not present

## 2018-07-23 DIAGNOSIS — E1129 Type 2 diabetes mellitus with other diabetic kidney complication: Secondary | ICD-10-CM | POA: Diagnosis not present

## 2018-07-23 DIAGNOSIS — N186 End stage renal disease: Secondary | ICD-10-CM | POA: Diagnosis not present

## 2018-07-23 DIAGNOSIS — D509 Iron deficiency anemia, unspecified: Secondary | ICD-10-CM | POA: Diagnosis not present

## 2018-07-23 DIAGNOSIS — N2581 Secondary hyperparathyroidism of renal origin: Secondary | ICD-10-CM | POA: Diagnosis not present

## 2018-07-23 DIAGNOSIS — D631 Anemia in chronic kidney disease: Secondary | ICD-10-CM | POA: Diagnosis not present

## 2018-07-25 DIAGNOSIS — G546 Phantom limb syndrome with pain: Secondary | ICD-10-CM | POA: Diagnosis not present

## 2018-07-25 DIAGNOSIS — E1140 Type 2 diabetes mellitus with diabetic neuropathy, unspecified: Secondary | ICD-10-CM | POA: Diagnosis not present

## 2018-07-25 DIAGNOSIS — I120 Hypertensive chronic kidney disease with stage 5 chronic kidney disease or end stage renal disease: Secondary | ICD-10-CM | POA: Diagnosis not present

## 2018-07-25 DIAGNOSIS — E1122 Type 2 diabetes mellitus with diabetic chronic kidney disease: Secondary | ICD-10-CM | POA: Diagnosis not present

## 2018-07-25 DIAGNOSIS — E1151 Type 2 diabetes mellitus with diabetic peripheral angiopathy without gangrene: Secondary | ICD-10-CM | POA: Diagnosis not present

## 2018-07-25 DIAGNOSIS — N186 End stage renal disease: Secondary | ICD-10-CM | POA: Diagnosis not present

## 2018-07-26 DIAGNOSIS — E1129 Type 2 diabetes mellitus with other diabetic kidney complication: Secondary | ICD-10-CM | POA: Diagnosis not present

## 2018-07-26 DIAGNOSIS — N2581 Secondary hyperparathyroidism of renal origin: Secondary | ICD-10-CM | POA: Diagnosis not present

## 2018-07-26 DIAGNOSIS — D631 Anemia in chronic kidney disease: Secondary | ICD-10-CM | POA: Diagnosis not present

## 2018-07-26 DIAGNOSIS — N186 End stage renal disease: Secondary | ICD-10-CM | POA: Diagnosis not present

## 2018-07-26 DIAGNOSIS — D509 Iron deficiency anemia, unspecified: Secondary | ICD-10-CM | POA: Diagnosis not present

## 2018-07-28 DIAGNOSIS — N186 End stage renal disease: Secondary | ICD-10-CM | POA: Diagnosis not present

## 2018-07-28 DIAGNOSIS — D631 Anemia in chronic kidney disease: Secondary | ICD-10-CM | POA: Diagnosis not present

## 2018-07-28 DIAGNOSIS — D509 Iron deficiency anemia, unspecified: Secondary | ICD-10-CM | POA: Diagnosis not present

## 2018-07-28 DIAGNOSIS — N2581 Secondary hyperparathyroidism of renal origin: Secondary | ICD-10-CM | POA: Diagnosis not present

## 2018-07-28 DIAGNOSIS — E1129 Type 2 diabetes mellitus with other diabetic kidney complication: Secondary | ICD-10-CM | POA: Diagnosis not present

## 2018-07-30 DIAGNOSIS — E1129 Type 2 diabetes mellitus with other diabetic kidney complication: Secondary | ICD-10-CM | POA: Diagnosis not present

## 2018-07-30 DIAGNOSIS — N2581 Secondary hyperparathyroidism of renal origin: Secondary | ICD-10-CM | POA: Diagnosis not present

## 2018-07-30 DIAGNOSIS — D631 Anemia in chronic kidney disease: Secondary | ICD-10-CM | POA: Diagnosis not present

## 2018-07-30 DIAGNOSIS — N186 End stage renal disease: Secondary | ICD-10-CM | POA: Diagnosis not present

## 2018-07-30 DIAGNOSIS — D509 Iron deficiency anemia, unspecified: Secondary | ICD-10-CM | POA: Diagnosis not present

## 2018-08-01 DIAGNOSIS — E1140 Type 2 diabetes mellitus with diabetic neuropathy, unspecified: Secondary | ICD-10-CM | POA: Diagnosis not present

## 2018-08-01 DIAGNOSIS — N186 End stage renal disease: Secondary | ICD-10-CM | POA: Diagnosis not present

## 2018-08-01 DIAGNOSIS — G546 Phantom limb syndrome with pain: Secondary | ICD-10-CM | POA: Diagnosis not present

## 2018-08-01 DIAGNOSIS — E1151 Type 2 diabetes mellitus with diabetic peripheral angiopathy without gangrene: Secondary | ICD-10-CM | POA: Diagnosis not present

## 2018-08-01 DIAGNOSIS — I120 Hypertensive chronic kidney disease with stage 5 chronic kidney disease or end stage renal disease: Secondary | ICD-10-CM | POA: Diagnosis not present

## 2018-08-01 DIAGNOSIS — E1122 Type 2 diabetes mellitus with diabetic chronic kidney disease: Secondary | ICD-10-CM | POA: Diagnosis not present

## 2018-08-02 DIAGNOSIS — D631 Anemia in chronic kidney disease: Secondary | ICD-10-CM | POA: Diagnosis not present

## 2018-08-02 DIAGNOSIS — N186 End stage renal disease: Secondary | ICD-10-CM | POA: Diagnosis not present

## 2018-08-02 DIAGNOSIS — E1129 Type 2 diabetes mellitus with other diabetic kidney complication: Secondary | ICD-10-CM | POA: Diagnosis not present

## 2018-08-02 DIAGNOSIS — D509 Iron deficiency anemia, unspecified: Secondary | ICD-10-CM | POA: Diagnosis not present

## 2018-08-02 DIAGNOSIS — N2581 Secondary hyperparathyroidism of renal origin: Secondary | ICD-10-CM | POA: Diagnosis not present

## 2018-08-04 DIAGNOSIS — E1129 Type 2 diabetes mellitus with other diabetic kidney complication: Secondary | ICD-10-CM | POA: Diagnosis not present

## 2018-08-04 DIAGNOSIS — N2581 Secondary hyperparathyroidism of renal origin: Secondary | ICD-10-CM | POA: Diagnosis not present

## 2018-08-04 DIAGNOSIS — D631 Anemia in chronic kidney disease: Secondary | ICD-10-CM | POA: Diagnosis not present

## 2018-08-04 DIAGNOSIS — N186 End stage renal disease: Secondary | ICD-10-CM | POA: Diagnosis not present

## 2018-08-04 DIAGNOSIS — D509 Iron deficiency anemia, unspecified: Secondary | ICD-10-CM | POA: Diagnosis not present

## 2018-08-06 DIAGNOSIS — N2581 Secondary hyperparathyroidism of renal origin: Secondary | ICD-10-CM | POA: Diagnosis not present

## 2018-08-06 DIAGNOSIS — N186 End stage renal disease: Secondary | ICD-10-CM | POA: Diagnosis not present

## 2018-08-06 DIAGNOSIS — D631 Anemia in chronic kidney disease: Secondary | ICD-10-CM | POA: Diagnosis not present

## 2018-08-06 DIAGNOSIS — E1129 Type 2 diabetes mellitus with other diabetic kidney complication: Secondary | ICD-10-CM | POA: Diagnosis not present

## 2018-08-06 DIAGNOSIS — D509 Iron deficiency anemia, unspecified: Secondary | ICD-10-CM | POA: Diagnosis not present

## 2018-08-09 DIAGNOSIS — N2581 Secondary hyperparathyroidism of renal origin: Secondary | ICD-10-CM | POA: Diagnosis not present

## 2018-08-09 DIAGNOSIS — N186 End stage renal disease: Secondary | ICD-10-CM | POA: Diagnosis not present

## 2018-08-09 DIAGNOSIS — D631 Anemia in chronic kidney disease: Secondary | ICD-10-CM | POA: Diagnosis not present

## 2018-08-09 DIAGNOSIS — E1129 Type 2 diabetes mellitus with other diabetic kidney complication: Secondary | ICD-10-CM | POA: Diagnosis not present

## 2018-08-09 DIAGNOSIS — D509 Iron deficiency anemia, unspecified: Secondary | ICD-10-CM | POA: Diagnosis not present

## 2018-08-11 DIAGNOSIS — E1129 Type 2 diabetes mellitus with other diabetic kidney complication: Secondary | ICD-10-CM | POA: Diagnosis not present

## 2018-08-11 DIAGNOSIS — N186 End stage renal disease: Secondary | ICD-10-CM | POA: Diagnosis not present

## 2018-08-11 DIAGNOSIS — D509 Iron deficiency anemia, unspecified: Secondary | ICD-10-CM | POA: Diagnosis not present

## 2018-08-11 DIAGNOSIS — N2581 Secondary hyperparathyroidism of renal origin: Secondary | ICD-10-CM | POA: Diagnosis not present

## 2018-08-11 DIAGNOSIS — D631 Anemia in chronic kidney disease: Secondary | ICD-10-CM | POA: Diagnosis not present

## 2018-08-13 DIAGNOSIS — N186 End stage renal disease: Secondary | ICD-10-CM | POA: Diagnosis not present

## 2018-08-13 DIAGNOSIS — D509 Iron deficiency anemia, unspecified: Secondary | ICD-10-CM | POA: Diagnosis not present

## 2018-08-13 DIAGNOSIS — D631 Anemia in chronic kidney disease: Secondary | ICD-10-CM | POA: Diagnosis not present

## 2018-08-13 DIAGNOSIS — N2581 Secondary hyperparathyroidism of renal origin: Secondary | ICD-10-CM | POA: Diagnosis not present

## 2018-08-13 DIAGNOSIS — E1129 Type 2 diabetes mellitus with other diabetic kidney complication: Secondary | ICD-10-CM | POA: Diagnosis not present

## 2018-08-16 DIAGNOSIS — E1129 Type 2 diabetes mellitus with other diabetic kidney complication: Secondary | ICD-10-CM | POA: Diagnosis not present

## 2018-08-16 DIAGNOSIS — Z992 Dependence on renal dialysis: Secondary | ICD-10-CM | POA: Diagnosis not present

## 2018-08-16 DIAGNOSIS — N186 End stage renal disease: Secondary | ICD-10-CM | POA: Diagnosis not present

## 2018-08-18 ENCOUNTER — Emergency Department (HOSPITAL_COMMUNITY): Payer: Medicare Other

## 2018-08-18 ENCOUNTER — Encounter (HOSPITAL_COMMUNITY): Payer: Self-pay

## 2018-08-18 ENCOUNTER — Emergency Department (HOSPITAL_COMMUNITY)
Admission: EM | Admit: 2018-08-18 | Discharge: 2018-08-18 | Disposition: A | Discharge status: Home / Self Care | Payer: Medicare Other | Attending: Emergency Medicine | Admitting: Emergency Medicine

## 2018-08-18 DIAGNOSIS — I120 Hypertensive chronic kidney disease with stage 5 chronic kidney disease or end stage renal disease: Secondary | ICD-10-CM | POA: Diagnosis not present

## 2018-08-18 DIAGNOSIS — R112 Nausea with vomiting, unspecified: Secondary | ICD-10-CM | POA: Diagnosis not present

## 2018-08-18 DIAGNOSIS — Z794 Long term (current) use of insulin: Secondary | ICD-10-CM | POA: Insufficient documentation

## 2018-08-18 DIAGNOSIS — Z89511 Acquired absence of right leg below knee: Secondary | ICD-10-CM | POA: Diagnosis not present

## 2018-08-18 DIAGNOSIS — Z79899 Other long term (current) drug therapy: Secondary | ICD-10-CM | POA: Diagnosis not present

## 2018-08-18 DIAGNOSIS — R001 Bradycardia, unspecified: Secondary | ICD-10-CM | POA: Diagnosis not present

## 2018-08-18 DIAGNOSIS — E1122 Type 2 diabetes mellitus with diabetic chronic kidney disease: Secondary | ICD-10-CM | POA: Diagnosis not present

## 2018-08-18 DIAGNOSIS — R4781 Slurred speech: Secondary | ICD-10-CM | POA: Diagnosis not present

## 2018-08-18 DIAGNOSIS — R0902 Hypoxemia: Secondary | ICD-10-CM | POA: Diagnosis not present

## 2018-08-18 DIAGNOSIS — E11311 Type 2 diabetes mellitus with unspecified diabetic retinopathy with macular edema: Secondary | ICD-10-CM | POA: Diagnosis not present

## 2018-08-18 DIAGNOSIS — Z7982 Long term (current) use of aspirin: Secondary | ICD-10-CM | POA: Diagnosis not present

## 2018-08-18 DIAGNOSIS — E1151 Type 2 diabetes mellitus with diabetic peripheral angiopathy without gangrene: Secondary | ICD-10-CM | POA: Diagnosis not present

## 2018-08-18 DIAGNOSIS — R197 Diarrhea, unspecified: Secondary | ICD-10-CM | POA: Diagnosis not present

## 2018-08-18 DIAGNOSIS — H548 Legal blindness, as defined in USA: Secondary | ICD-10-CM | POA: Diagnosis not present

## 2018-08-18 DIAGNOSIS — N186 End stage renal disease: Secondary | ICD-10-CM | POA: Diagnosis not present

## 2018-08-18 DIAGNOSIS — F17210 Nicotine dependence, cigarettes, uncomplicated: Secondary | ICD-10-CM | POA: Insufficient documentation

## 2018-08-18 DIAGNOSIS — Z992 Dependence on renal dialysis: Secondary | ICD-10-CM | POA: Insufficient documentation

## 2018-08-18 DIAGNOSIS — R5383 Other fatigue: Secondary | ICD-10-CM | POA: Diagnosis not present

## 2018-08-18 DIAGNOSIS — R531 Weakness: Secondary | ICD-10-CM | POA: Diagnosis not present

## 2018-08-18 DIAGNOSIS — I10 Essential (primary) hypertension: Secondary | ICD-10-CM | POA: Diagnosis not present

## 2018-08-18 DIAGNOSIS — Z89512 Acquired absence of left leg below knee: Secondary | ICD-10-CM | POA: Insufficient documentation

## 2018-08-18 LAB — LIPASE, BLOOD: Lipase: 33 U/L (ref 11–51)

## 2018-08-18 LAB — COMPREHENSIVE METABOLIC PANEL
ALBUMIN: 3.5 g/dL (ref 3.5–5.0)
ALT: 16 U/L (ref 0–44)
ANION GAP: 16 — AB (ref 5–15)
AST: 24 U/L (ref 15–41)
Alkaline Phosphatase: 77 U/L (ref 38–126)
BUN: 67 mg/dL — ABNORMAL HIGH (ref 8–23)
CO2: 20 mmol/L — ABNORMAL LOW (ref 22–32)
Calcium: 7.8 mg/dL — ABNORMAL LOW (ref 8.9–10.3)
Chloride: 89 mmol/L — ABNORMAL LOW (ref 98–111)
Creatinine, Ser: 9.92 mg/dL — ABNORMAL HIGH (ref 0.61–1.24)
GFR calc Af Amer: 6 mL/min — ABNORMAL LOW (ref >60)
GFR, EST NON AFRICAN AMERICAN: 5 mL/min — AB (ref >60)
GLUCOSE: 137 mg/dL — AB (ref 70–99)
POTASSIUM: 4.6 mmol/L (ref 3.5–5.1)
Sodium: 125 mmol/L — ABNORMAL LOW (ref 135–145)
Total Bilirubin: 0.9 mg/dL (ref 0.3–1.2)
Total Protein: 6.8 g/dL (ref 6.5–8.1)

## 2018-08-18 LAB — CBC
HCT: 34.8 % — ABNORMAL LOW (ref 39.0–52.0)
Hemoglobin: 11.6 g/dL — ABNORMAL LOW (ref 13.0–17.0)
MCH: 30.1 pg (ref 26.0–34.0)
MCHC: 33.3 g/dL (ref 30.0–36.0)
MCV: 90.2 fL (ref 78.0–100.0)
Platelets: 105 10*3/uL — ABNORMAL LOW (ref 150–400)
RBC: 3.86 MIL/uL — ABNORMAL LOW (ref 4.22–5.81)
RDW: 16.2 % — AB (ref 11.5–15.5)
WBC: 3.3 10*3/uL — ABNORMAL LOW (ref 4.0–10.5)

## 2018-08-18 LAB — I-STAT TROPONIN, ED: Troponin i, poc: 0.11 ng/mL (ref 0.00–0.08)

## 2018-08-18 LAB — I-STAT CG4 LACTIC ACID, ED: Lactic Acid, Venous: 0.58 mmol/L (ref 0.5–1.9)

## 2018-08-18 MED ORDER — HYDRALAZINE HCL 25 MG PO TABS
25.0000 mg | ORAL_TABLET | Freq: Once | ORAL | Status: AC
  Administered 2018-08-18: 25 mg via ORAL
  Filled 2018-08-18: qty 1

## 2018-08-18 NOTE — ED Notes (Signed)
Patient verbalizes understanding of discharge instructions. Opportunity for questioning and answers were provided. Armband removed by staff, pt discharged from ED. Pt's son notified of pickup plan. Pt taken to lobby via hospital wheelchair to await ride.

## 2018-08-18 NOTE — Discharge Instructions (Signed)
Please go to your dialysis appointment tomorrow, October 4 at 12 noon.

## 2018-08-18 NOTE — ED Triage Notes (Signed)
Pt presents for evaluation of generalized weakness x 2 days. Family reports slurred speech x 1 day. HR 50's, Hypertensive. T/TH/Sat dialysis, has missed Tuesday and today due to not feeling well. CBG 121. No focal neuro deficits per EMS.

## 2018-08-18 NOTE — ED Provider Notes (Signed)
[facility] EMERGENCY DEPARTMENT Provider Note   CSN: [id number] Arrival date & time: 08/18/18  1040     History   Chief Complaint Chief Complaint  Patient presents with  . Weakness    HPI Garrett Salas is a 67 y.o. male with a PMH significant for well-controlled type 2 diabetes, ESRD on Tuesday Thursday Saturday dialysis, bilateral BKA, diabetic retinopathy leading to legal blindness, tobacco abuse who presents with several days of fatigue and weakness, diarrhea, nausea/vomiting, and abdominal pain.  His family also reports some slurred speech.  Patient said he tried to take some medicine that contained a lot of vitamin C to help him feel better, but this did not provide any relief.  He skipped dialysis on Tuesday due to feeling poorly.  He denies fever and upper respiratory symptoms.  He does not produce urine.  He denies chest pain does endorse shortness of breath, which is currently being alleviated with oxygen via nasal cannula.    Past Medical History:  Diagnosis Date  . Diabetes mellitus    Type 2. Diagnosed in mid 7s. Currently insulin requiring  . Diabetic neuropathy ([place])   . Diabetic retinopathy    legally blind  . Dialysis patient ([place])   . ESRD (end stage renal disease) on dialysis ([place])    due to diabetic nephropathy. T/Th/Sat  . Hx of right BKA ([place])   . Hyperlipidemia   . Hypertension   . Peripheral vascular disease ([place])   . Tobacco abuse     Patient Active Problem List   Diagnosis Date Noted  . Phantom limb pain- Left stump 06/21/2015  . Postoperative anemia due to acute blood loss 05/18/2015  . Protein-calorie malnutrition, severe ([place]) 05/17/2015  . Critical lower limb ischemia   . ESRD on hemodialysis ([place])   . Hypertensive urgency   . Acute encephalopathy   . Elevated troponin   . Generalized weakness 05/14/2015  . Gangrene of foot ([place]) 05/14/2015  . Hypertensive heart disease 05/14/2015  . Gangrene of lower extremity ([place])  05/14/2015  . S/P BKA (below knee amputation) bilateral ([place]) 02/07/2015  . PAD (peripheral artery disease) ([place]) 01/07/2015  . ESRD on dialysis ([place]) 01/04/2015  . HLD (hyperlipidemia) 01/04/2015  . DM (diabetes mellitus) type II controlled peripheral vascular disorder 01/04/2015  . Atherosclerosis of native arteries of the extremities with gangrene ([place]) 01/04/2015  . Dyspnea 05/18/2011  . Hypertension   . Tobacco abuse     Past Surgical History:  Procedure Laterality Date  . ABDOMINAL AORTAGRAM N/A 01/07/2015   Procedure: ABDOMINAL Maxcine Ham;  Surgeon: Conrad St[place] John, MD;  [facility] C[facility] LAB;  Service: Cardiovascular;  Laterality: N/A;  . AMPUTATION Right 01/09/2015   Procedure: AMPUTATION BELOW KNEE RIGHT LEG;  Surgeon: Conrad [place], MD;  Location: [facility];  Service: Vascular;  Laterality: Right;  . AMPUTATION Left 05/16/2015   Procedure: LEFT BELOW THE KNEE AMPUTATION;  Surgeon: Conrad Mathews, MD;  Location: [facility];  Service: Vascular;  Laterality: Left;  . AV FISTULA PLACEMENT    . CARDIAC CATHETERIZATION  05/06/2011   no significant CAD, EF 55-60%, LVEDP :15   . none          Home Medications    Prior to Admission medications   Medication Sig Start Date End Date Taking? Authorizing Provider  albuterol (PROVENTIL HFA;VENTOLIN HFA) 108 (90 BASE) MCG/ACT inhaler Inhale 2 puffs into the lungs every 6 (six) hours as needed for wheezing or shortness of breath.  Yes [provider]  amLODipine (NORVASC) 10 MG tablet Take 1 tablet (10 mg total) by mouth daily. Patient taking differently: Take 5 mg by mouth daily.  05/24/15  Yes Nita Sells, MD  aspirin EC 81 MG tablet Take 81 mg by mouth daily.   Yes [provider]  atorvastatin (LIPITOR) 40 MG tablet Take 40 mg by mouth daily.   Yes [provider]  calcitRIOL (ROCALTROL) 0.5 MCG capsule Take 1 capsule (0.5 mcg total) by mouth Every Tuesday,Thursday,and Saturday with dialysis. 05/24/15  Yes Nita Sells, MD  cloNIDine (CATAPRES) 0.1 MG tablet Take 0.1 mg by mouth 3 (three) times daily. 12/29/17  Yes [provider]  diphenhydrAMINE (BENADRYL) 25 MG tablet Take 25 mg by mouth as needed for itching.   Yes [provider]  HUMULIN 70/30 KWIKPEN (70-30) 100 UNIT/ML PEN Inject 5-20 Units into the skin 2 (two) times daily as needed (CBG over 175).  11/27/14  Yes [provider]  Insulin Glargine (LANTUS SOLOSTAR) 100 UNIT/ML Solostar Pen Inject 20 Units into the skin at bedtime. 06/06/18  Yes [provider]  lisinopril (PRINIVIL,ZESTRIL) 20 MG tablet Take 20 mg by mouth daily.   Yes [provider]  LYRICA 75 MG capsule Take 75 mg by mouth 3 (three) times daily.  04/23/15  Yes [provider]  metoCLOPramide (REGLAN) 5 MG tablet Take 5 mg by mouth 3 (three) times daily as needed for indigestion. 03/18/18  Yes [provider]  sevelamer (RENVELA) 800 MG tablet Take 2,400 mg by mouth 3 (three) times daily with meals.    Yes [provider]  silver sulfADIAZINE (SILVADENE) 1 % cream Apply 1 application topically daily. 03/29/15  Yes Bronson Ing, DPM  ACCU-CHEK AVIVA PLUS test strip  01/17/15   [provider]  ACCU-CHEK SOFTCLIX LANCETS lancets  01/16/15   [provider]  atorvastatin (LIPITOR) 20 MG tablet Take 1 tablet (20 mg total) by mouth daily at 6 PM. Patient not taking: Reported on 08/18/2018 05/24/15   Nita Sells, MD  B-D UF III MINI PEN NEEDLES 31G X 5 MM MISC  01/21/15   [provider]  Blood Glucose Monitoring Suppl (ACCU-CHEK AVIVA PLUS) W/DEVICE KIT  01/16/15   [provider]  cefTAZidime 2 g in dextrose 5 % 50 mL Inject 2 g into the vein Every Tuesday,Thursday,and Saturday with dialysis. Patient not taking: Reported on 08/18/2018 05/24/15   Nita Sells, MD  hydrALAZINE (APRESOLINE) 25 MG tablet Take 1 tablet (25 mg total) by mouth every 6 (six) hours. Patient not  taking: Reported on 08/18/2018 05/24/15   Nita Sells, MD  pantoprazole (PROTONIX) 40 MG tablet Take 1 tablet (40 mg total) by mouth daily. Patient not taking: Reported on 08/18/2018 05/24/15   Nita Sells, MD  Flossie Buffy INSULIN SYRINGE 29G X 1/2" 0.5 ML MISC  04/08/15   [provider]  Vancomycin (VANCOCIN) 750 MG/150ML SOLN Inject 150 mLs (750 mg total) into the vein Every Tuesday,Thursday,and Saturday with dialysis. Patient not taking: Reported on 08/18/2018 05/24/15   Nita Sells, MD    Family History Family History  Problem Relation Age of Onset  . Diabetes Mother   . Heart disease Unknown   . Alcohol abuse Unknown   . Diabetes Sister   . Diabetes Brother     Social History Social History   Tobacco Use  . Smoking status: Light Tobacco Smoker    Packs/day: 1.00    Years: 30.00  Pack years: 30.00    Types: Cigarettes  . Smokeless tobacco: Never Used  Substance Use Topics  . Alcohol use: No    Alcohol/week: 0.0 standard drinks  . Drug use: Yes    Types: Marijuana     Allergies   Patient has no known allergies.   Review of Systems Review of Systems  Constitutional: Positive for activity change and fatigue. Negative for appetite change and fever.  HENT: Negative for congestion, rhinorrhea, sneezing and sore throat.   Respiratory: Negative for cough.   Gastrointestinal: Positive for abdominal pain, diarrhea, nausea and vomiting.  Skin: Negative for rash and wound.  Neurological: Positive for weakness. Negative for dizziness and headaches.     Physical Exam Updated Vital Signs BP (!) 183/75   Pulse (!) 51   Temp 98 F (36.7 C) (Oral)   Resp (!) 9   SpO2 93%   Physical Exam  Constitutional: He is oriented to person, place, and time. He appears well-developed.  HENT:  Head: Normocephalic and atraumatic.  Nose: Nose normal.  Neck: Normal range of motion.  Cardiovascular: Normal rate and regular rhythm.  Pulmonary/Chest: Effort  normal and breath sounds normal. He has no rales.  2 L oxygen via nasal cannula in place  Abdominal: Soft. Bowel sounds are normal. There is tenderness. There is guarding.  Musculoskeletal: Normal range of motion. He exhibits no tenderness.  Good skin integrity of bilateral BKA's  Neurological: He is alert and oriented to person, place, and time. No cranial nerve deficit.  Speech is slow but not slurred, although baseline is unknown to me  Skin: Skin is warm and dry.  Psychiatric: He has a normal mood and affect. His behavior is normal.     ED Treatments / Results  Labs (all labs ordered are listed, but only abnormal results are displayed) Labs Reviewed  CBC - Abnormal; Notable for the following components:      Result Value   WBC 3.3 (*)    RBC 3.86 (*)    Hemoglobin 11.6 (*)    HCT 34.8 (*)    RDW 16.2 (*)    Platelets 105 (*)    All other components within normal limits  COMPREHENSIVE METABOLIC PANEL - Abnormal; Notable for the following components:   Sodium 125 (*)    Chloride 89 (*)    CO2 20 (*)    Glucose, Bld 137 (*)    BUN 67 (*)    Creatinine, Ser 9.92 (*)    Calcium 7.8 (*)    GFR calc non Af Amer 5 (*)    GFR calc Af Amer 6 (*)    Anion gap 16 (*)    All other components within normal limits  I-STAT TROPONIN, ED - Abnormal; Notable for the following components:   Troponin i, poc 0.11 (*)    All other components within normal limits  LIPASE, BLOOD  I-STAT CG4 LACTIC ACID, ED  I-STAT CG4 LACTIC ACID, ED    EKG EKG Interpretation  Date/Time:  Thursday August 18 2018 11:03:10 EDT Ventricular Rate:  56 PR Interval:  156 QRS Duration: 94 QT Interval:  488 QTC Calculation: 470 R Axis:   -28 Text Interpretation:  Sinus bradycardia Nonspecific T wave abnormality Prolonged QT Abnormal ECG peaked T waves new from previous previous T wave inversions have normalized Confirmed by Theotis Burrow [phone]) on 08/18/2018 11:16:26 AM   Radiology Dg Chest 2  View  Result Date: 08/18/2018 CLINICAL DATA:  Generalized weakness for 2 days  EXAM: CHEST - 2 VIEW COMPARISON:  04/10/2016 FINDINGS: Cardiac shadow is mildly enlarged but stable. The lungs are well aerated bilaterally. No focal infiltrate or sizable effusion is seen. No acute bony abnormality is noted. Soft tissue calcifications are noted in the right neck likely vascular in nature. IMPRESSION: No acute abnormality noted. Electronically Signed   By: Inez Catalina M.D.   On: 08/18/2018 12:09   Ct Head Wo Contrast  Result Date: 08/18/2018 CLINICAL DATA:  Slurred speech.  Renal failure. EXAM: CT HEAD WITHOUT CONTRAST TECHNIQUE: Contiguous axial images were obtained from the base of the skull through the vertex without intravenous contrast. COMPARISON:  Head CT May 21, 2015; brain MRI May 23, 2015 FINDINGS: Brain: There is mild diffuse atrophy, stable. There is a cavum septum pellucidum, an anatomic variant. There is no intracranial mass, hemorrhage, extra-axial fluid collection, or midline shift. There is small vessel disease throughout the centra semiovale bilaterally. No new parenchymal lesion evident. No acute infarct evident. Vascular: No hyperdense vessel. There is calcification in each distal vertebral artery as well as in carotid siphon region. There are multiple scalp region vascular calcifications as well. Skull: Bony calvarium appears intact. Sinuses/Orbits: There is mucosal thickening and opacification in multiple ethmoid air cells. There is subtle opacifications in the left maxillary antrum. There is minimal mucosal thickening in the sphenoid sinus region anteriorly. Orbits appear symmetric bilaterally. Other: Mastoid air cells are clear. IMPRESSION: Atrophy with periventricular small vessel disease, stable. No acute infarct. No mass or hemorrhage. Foci of arterial vascular calcification noted. Areas of paranasal sinus disease also noted. Electronically Signed   By: Lowella Grip III M.D.   On:  08/18/2018 12:54    Procedures Procedures (including critical care time)  Medications Ordered in ED Medications  hydrALAZINE (APRESOLINE) tablet 25 mg (25 mg Oral Given 08/18/18 1458)     Initial Impression / Assessment and Plan / ED Course  I have reviewed the triage vital signs and the nursing notes.  Pertinent labs & imaging results that were available during my care of the patient were reviewed by me and considered in my medical decision making (see chart for details).     Differential of patient's weakness and GI symptoms include an infectious cause such as viral gastroenteritis or other infections, dehydration, ACS/CHF, and metabolic derangements.  Will obtain troponin, CBC, CMP, chest x-ray, lipase, and EKG for evaluation.  We will also obtain head CT due to family's concern for slurred speech.  Work up remarkable for hyponatremia of 125, which will likely resolve with PO intake.  Also remarkable for elevated troponin, but this is close to his baseline.  BP elevated on multiple measurements, with last measurement of 205/84, so will give patient his home hydralazine 25 mg.  Passed PO challenge and has normal SpO2 on room air.  Since patient likely is chronically hypertensive and is currently asymptomatic, we will discharge him with an appointment for dialysis tomorrow.  Final Clinical Impressions(s) / ED Diagnoses   Final diagnoses:  Generalized weakness    ED Discharge Orders    None       Kathrene Alu, MD 08/18/18 1503    Little, Wenda Overland, MD 08/20/18 1247

## 2018-08-19 DIAGNOSIS — D509 Iron deficiency anemia, unspecified: Secondary | ICD-10-CM | POA: Diagnosis not present

## 2018-08-19 DIAGNOSIS — Z23 Encounter for immunization: Secondary | ICD-10-CM | POA: Diagnosis not present

## 2018-08-19 DIAGNOSIS — D631 Anemia in chronic kidney disease: Secondary | ICD-10-CM | POA: Diagnosis not present

## 2018-08-19 DIAGNOSIS — N2581 Secondary hyperparathyroidism of renal origin: Secondary | ICD-10-CM | POA: Diagnosis not present

## 2018-08-19 DIAGNOSIS — N186 End stage renal disease: Secondary | ICD-10-CM | POA: Diagnosis not present

## 2018-08-19 DIAGNOSIS — E162 Hypoglycemia, unspecified: Secondary | ICD-10-CM | POA: Diagnosis not present

## 2018-08-19 DIAGNOSIS — E1129 Type 2 diabetes mellitus with other diabetic kidney complication: Secondary | ICD-10-CM | POA: Diagnosis not present

## 2018-08-20 DIAGNOSIS — N2581 Secondary hyperparathyroidism of renal origin: Secondary | ICD-10-CM | POA: Diagnosis not present

## 2018-08-20 DIAGNOSIS — N186 End stage renal disease: Secondary | ICD-10-CM | POA: Diagnosis not present

## 2018-08-20 DIAGNOSIS — D509 Iron deficiency anemia, unspecified: Secondary | ICD-10-CM | POA: Diagnosis not present

## 2018-08-20 DIAGNOSIS — E1129 Type 2 diabetes mellitus with other diabetic kidney complication: Secondary | ICD-10-CM | POA: Diagnosis not present

## 2018-08-20 DIAGNOSIS — E162 Hypoglycemia, unspecified: Secondary | ICD-10-CM | POA: Diagnosis not present

## 2018-08-23 DIAGNOSIS — E162 Hypoglycemia, unspecified: Secondary | ICD-10-CM | POA: Diagnosis not present

## 2018-08-23 DIAGNOSIS — N2581 Secondary hyperparathyroidism of renal origin: Secondary | ICD-10-CM | POA: Diagnosis not present

## 2018-08-23 DIAGNOSIS — E1129 Type 2 diabetes mellitus with other diabetic kidney complication: Secondary | ICD-10-CM | POA: Diagnosis not present

## 2018-08-23 DIAGNOSIS — N186 End stage renal disease: Secondary | ICD-10-CM | POA: Diagnosis not present

## 2018-08-23 DIAGNOSIS — D509 Iron deficiency anemia, unspecified: Secondary | ICD-10-CM | POA: Diagnosis not present

## 2018-08-25 DIAGNOSIS — E162 Hypoglycemia, unspecified: Secondary | ICD-10-CM | POA: Diagnosis not present

## 2018-08-25 DIAGNOSIS — E1129 Type 2 diabetes mellitus with other diabetic kidney complication: Secondary | ICD-10-CM | POA: Diagnosis not present

## 2018-08-25 DIAGNOSIS — N2581 Secondary hyperparathyroidism of renal origin: Secondary | ICD-10-CM | POA: Diagnosis not present

## 2018-08-25 DIAGNOSIS — D509 Iron deficiency anemia, unspecified: Secondary | ICD-10-CM | POA: Diagnosis not present

## 2018-08-25 DIAGNOSIS — N186 End stage renal disease: Secondary | ICD-10-CM | POA: Diagnosis not present

## 2018-08-27 DIAGNOSIS — E162 Hypoglycemia, unspecified: Secondary | ICD-10-CM | POA: Diagnosis not present

## 2018-08-27 DIAGNOSIS — D509 Iron deficiency anemia, unspecified: Secondary | ICD-10-CM | POA: Diagnosis not present

## 2018-08-27 DIAGNOSIS — E1129 Type 2 diabetes mellitus with other diabetic kidney complication: Secondary | ICD-10-CM | POA: Diagnosis not present

## 2018-08-27 DIAGNOSIS — N186 End stage renal disease: Secondary | ICD-10-CM | POA: Diagnosis not present

## 2018-08-27 DIAGNOSIS — N2581 Secondary hyperparathyroidism of renal origin: Secondary | ICD-10-CM | POA: Diagnosis not present

## 2018-08-30 DIAGNOSIS — E1129 Type 2 diabetes mellitus with other diabetic kidney complication: Secondary | ICD-10-CM | POA: Diagnosis not present

## 2018-08-30 DIAGNOSIS — N186 End stage renal disease: Secondary | ICD-10-CM | POA: Diagnosis not present

## 2018-08-30 DIAGNOSIS — N2581 Secondary hyperparathyroidism of renal origin: Secondary | ICD-10-CM | POA: Diagnosis not present

## 2018-08-30 DIAGNOSIS — E162 Hypoglycemia, unspecified: Secondary | ICD-10-CM | POA: Diagnosis not present

## 2018-08-30 DIAGNOSIS — D509 Iron deficiency anemia, unspecified: Secondary | ICD-10-CM | POA: Diagnosis not present

## 2018-09-01 DIAGNOSIS — E1129 Type 2 diabetes mellitus with other diabetic kidney complication: Secondary | ICD-10-CM | POA: Diagnosis not present

## 2018-09-01 DIAGNOSIS — N186 End stage renal disease: Secondary | ICD-10-CM | POA: Diagnosis not present

## 2018-09-01 DIAGNOSIS — D509 Iron deficiency anemia, unspecified: Secondary | ICD-10-CM | POA: Diagnosis not present

## 2018-09-01 DIAGNOSIS — E162 Hypoglycemia, unspecified: Secondary | ICD-10-CM | POA: Diagnosis not present

## 2018-09-01 DIAGNOSIS — N2581 Secondary hyperparathyroidism of renal origin: Secondary | ICD-10-CM | POA: Diagnosis not present

## 2018-09-03 ENCOUNTER — Emergency Department (HOSPITAL_COMMUNITY): Payer: Medicare Other

## 2018-09-03 ENCOUNTER — Encounter (HOSPITAL_COMMUNITY): Payer: Self-pay | Admitting: Emergency Medicine

## 2018-09-03 ENCOUNTER — Observation Stay (HOSPITAL_COMMUNITY)
Admission: EM | Admit: 2018-09-03 | Discharge: 2018-09-04 | Disposition: A | Discharge status: Home / Self Care | Payer: Medicare Other | Attending: Internal Medicine | Admitting: Internal Medicine

## 2018-09-03 ENCOUNTER — Observation Stay (HOSPITAL_COMMUNITY): Payer: Medicare Other

## 2018-09-03 DIAGNOSIS — Z720 Tobacco use: Secondary | ICD-10-CM

## 2018-09-03 DIAGNOSIS — H548 Legal blindness, as defined in USA: Secondary | ICD-10-CM | POA: Diagnosis not present

## 2018-09-03 DIAGNOSIS — J1289 Other viral pneumonia: Secondary | ICD-10-CM | POA: Diagnosis not present

## 2018-09-03 DIAGNOSIS — N2581 Secondary hyperparathyroidism of renal origin: Secondary | ICD-10-CM | POA: Diagnosis not present

## 2018-09-03 DIAGNOSIS — E11319 Type 2 diabetes mellitus with unspecified diabetic retinopathy without macular edema: Secondary | ICD-10-CM | POA: Insufficient documentation

## 2018-09-03 DIAGNOSIS — R0902 Hypoxemia: Secondary | ICD-10-CM | POA: Diagnosis not present

## 2018-09-03 DIAGNOSIS — R05 Cough: Secondary | ICD-10-CM | POA: Diagnosis not present

## 2018-09-03 DIAGNOSIS — E785 Hyperlipidemia, unspecified: Secondary | ICD-10-CM | POA: Insufficient documentation

## 2018-09-03 DIAGNOSIS — F17210 Nicotine dependence, cigarettes, uncomplicated: Secondary | ICD-10-CM | POA: Insufficient documentation

## 2018-09-03 DIAGNOSIS — E43 Unspecified severe protein-calorie malnutrition: Secondary | ICD-10-CM | POA: Diagnosis not present

## 2018-09-03 DIAGNOSIS — I444 Left anterior fascicular block: Secondary | ICD-10-CM | POA: Diagnosis not present

## 2018-09-03 DIAGNOSIS — J441 Chronic obstructive pulmonary disease with (acute) exacerbation: Secondary | ICD-10-CM | POA: Insufficient documentation

## 2018-09-03 DIAGNOSIS — I739 Peripheral vascular disease, unspecified: Secondary | ICD-10-CM

## 2018-09-03 DIAGNOSIS — A419 Sepsis, unspecified organism: Secondary | ICD-10-CM | POA: Diagnosis not present

## 2018-09-03 DIAGNOSIS — D509 Iron deficiency anemia, unspecified: Secondary | ICD-10-CM | POA: Diagnosis not present

## 2018-09-03 DIAGNOSIS — Z79899 Other long term (current) drug therapy: Secondary | ICD-10-CM | POA: Diagnosis not present

## 2018-09-03 DIAGNOSIS — Z89512 Acquired absence of left leg below knee: Secondary | ICD-10-CM | POA: Diagnosis not present

## 2018-09-03 DIAGNOSIS — G9340 Encephalopathy, unspecified: Secondary | ICD-10-CM | POA: Diagnosis present

## 2018-09-03 DIAGNOSIS — G92 Toxic encephalopathy: Secondary | ICD-10-CM | POA: Diagnosis not present

## 2018-09-03 DIAGNOSIS — I120 Hypertensive chronic kidney disease with stage 5 chronic kidney disease or end stage renal disease: Secondary | ICD-10-CM | POA: Diagnosis not present

## 2018-09-03 DIAGNOSIS — G912 (Idiopathic) normal pressure hydrocephalus: Secondary | ICD-10-CM | POA: Insufficient documentation

## 2018-09-03 DIAGNOSIS — B9789 Other viral agents as the cause of diseases classified elsewhere: Secondary | ICD-10-CM | POA: Diagnosis not present

## 2018-09-03 DIAGNOSIS — E1151 Type 2 diabetes mellitus with diabetic peripheral angiopathy without gangrene: Secondary | ICD-10-CM

## 2018-09-03 DIAGNOSIS — R7989 Other specified abnormal findings of blood chemistry: Secondary | ICD-10-CM | POA: Insufficient documentation

## 2018-09-03 DIAGNOSIS — Z7982 Long term (current) use of aspirin: Secondary | ICD-10-CM | POA: Diagnosis not present

## 2018-09-03 DIAGNOSIS — E1122 Type 2 diabetes mellitus with diabetic chronic kidney disease: Secondary | ICD-10-CM | POA: Diagnosis not present

## 2018-09-03 DIAGNOSIS — E1129 Type 2 diabetes mellitus with other diabetic kidney complication: Secondary | ICD-10-CM | POA: Diagnosis not present

## 2018-09-03 DIAGNOSIS — R509 Fever, unspecified: Secondary | ICD-10-CM | POA: Diagnosis not present

## 2018-09-03 DIAGNOSIS — R4182 Altered mental status, unspecified: Secondary | ICD-10-CM

## 2018-09-03 DIAGNOSIS — N186 End stage renal disease: Secondary | ICD-10-CM | POA: Insufficient documentation

## 2018-09-03 DIAGNOSIS — I700 Atherosclerosis of aorta: Secondary | ICD-10-CM | POA: Insufficient documentation

## 2018-09-03 DIAGNOSIS — I10 Essential (primary) hypertension: Secondary | ICD-10-CM | POA: Diagnosis present

## 2018-09-03 DIAGNOSIS — R079 Chest pain, unspecified: Secondary | ICD-10-CM | POA: Diagnosis present

## 2018-09-03 DIAGNOSIS — Z794 Long term (current) use of insulin: Secondary | ICD-10-CM | POA: Diagnosis not present

## 2018-09-03 DIAGNOSIS — E1143 Type 2 diabetes mellitus with diabetic autonomic (poly)neuropathy: Secondary | ICD-10-CM | POA: Diagnosis not present

## 2018-09-03 DIAGNOSIS — N2889 Other specified disorders of kidney and ureter: Secondary | ICD-10-CM

## 2018-09-03 DIAGNOSIS — E162 Hypoglycemia, unspecified: Secondary | ICD-10-CM | POA: Diagnosis not present

## 2018-09-03 DIAGNOSIS — R778 Other specified abnormalities of plasma proteins: Secondary | ICD-10-CM | POA: Diagnosis present

## 2018-09-03 DIAGNOSIS — R0789 Other chest pain: Secondary | ICD-10-CM | POA: Diagnosis not present

## 2018-09-03 DIAGNOSIS — Z992 Dependence on renal dialysis: Secondary | ICD-10-CM

## 2018-09-03 DIAGNOSIS — K3184 Gastroparesis: Secondary | ICD-10-CM | POA: Insufficient documentation

## 2018-09-03 DIAGNOSIS — R6520 Severe sepsis without septic shock: Secondary | ICD-10-CM

## 2018-09-03 DIAGNOSIS — Z89511 Acquired absence of right leg below knee: Secondary | ICD-10-CM | POA: Insufficient documentation

## 2018-09-03 DIAGNOSIS — I151 Hypertension secondary to other renal disorders: Secondary | ICD-10-CM | POA: Diagnosis not present

## 2018-09-03 DIAGNOSIS — R531 Weakness: Secondary | ICD-10-CM | POA: Diagnosis not present

## 2018-09-03 LAB — CBC WITH DIFFERENTIAL/PLATELET
ABS IMMATURE GRANULOCYTES: 0.03 10*3/uL (ref 0.00–0.07)
BASOS PCT: 0 %
Basophils Absolute: 0 10*3/uL (ref 0.0–0.1)
EOS ABS: 0 10*3/uL (ref 0.0–0.5)
Eosinophils Relative: 0 %
HCT: 51.8 % (ref 39.0–52.0)
Hemoglobin: 16.8 g/dL (ref 13.0–17.0)
Immature Granulocytes: 0 %
Lymphocytes Relative: 9 %
Lymphs Abs: 0.7 10*3/uL (ref 0.7–4.0)
MCH: 29.6 pg (ref 26.0–34.0)
MCHC: 32.4 g/dL (ref 30.0–36.0)
MCV: 91.2 fL (ref 80.0–100.0)
MONO ABS: 0.4 10*3/uL (ref 0.1–1.0)
MONOS PCT: 6 %
Neutro Abs: 5.9 10*3/uL (ref 1.7–7.7)
Neutrophils Relative %: 85 %
PLATELETS: 98 10*3/uL — AB (ref 150–400)
RBC: 5.68 MIL/uL (ref 4.22–5.81)
RDW: 15.9 % — AB (ref 11.5–15.5)
WBC: 7 10*3/uL (ref 4.0–10.5)
nRBC: 0 % (ref 0.0–0.2)

## 2018-09-03 LAB — I-STAT CG4 LACTIC ACID, ED
LACTIC ACID, VENOUS: 3.02 mmol/L — AB (ref 0.5–1.9)
Lactic Acid, Venous: 1.95 mmol/L — ABNORMAL HIGH (ref 0.5–1.9)

## 2018-09-03 LAB — CBG MONITORING, ED: GLUCOSE-CAPILLARY: 123 mg/dL — AB (ref 70–99)

## 2018-09-03 LAB — I-STAT VENOUS BLOOD GAS, ED
Bicarbonate: 25.9 mmol/L (ref 20.0–28.0)
O2 Saturation: 95 %
PCO2 VEN: 43.3 mmHg — AB (ref 44.0–60.0)
PH VEN: 7.385 (ref 7.250–7.430)
TCO2: 27 mmol/L (ref 22–32)
pO2, Ven: 80 mmHg — ABNORMAL HIGH (ref 32.0–45.0)

## 2018-09-03 LAB — COMPREHENSIVE METABOLIC PANEL
ALT: 21 U/L (ref 0–44)
ANION GAP: 18 — AB (ref 5–15)
AST: 33 U/L (ref 15–41)
Albumin: 3.8 g/dL (ref 3.5–5.0)
Alkaline Phosphatase: 106 U/L (ref 38–126)
BUN: 23 mg/dL (ref 8–23)
CALCIUM: 9.8 mg/dL (ref 8.9–10.3)
CHLORIDE: 96 mmol/L — AB (ref 98–111)
CO2: 21 mmol/L — ABNORMAL LOW (ref 22–32)
CREATININE: 4.29 mg/dL — AB (ref 0.61–1.24)
GFR calc Af Amer: 15 mL/min — ABNORMAL LOW (ref >60)
GFR, EST NON AFRICAN AMERICAN: 13 mL/min — AB (ref >60)
Glucose, Bld: 146 mg/dL — ABNORMAL HIGH (ref 70–99)
Potassium: 3.6 mmol/L (ref 3.5–5.1)
Sodium: 135 mmol/L (ref 135–145)
Total Bilirubin: 1.3 mg/dL — ABNORMAL HIGH (ref 0.3–1.2)
Total Protein: 9.1 g/dL — ABNORMAL HIGH (ref 6.5–8.1)

## 2018-09-03 LAB — I-STAT TROPONIN, ED: Troponin i, poc: 0.08 ng/mL (ref 0.00–0.08)

## 2018-09-03 LAB — PROTIME-INR
INR: 1.12
PROTHROMBIN TIME: 14.3 s (ref 11.4–15.2)

## 2018-09-03 LAB — LACTIC ACID, PLASMA: LACTIC ACID, VENOUS: 1.4 mmol/L (ref 0.5–1.9)

## 2018-09-03 LAB — GLUCOSE, CAPILLARY: Glucose-Capillary: 88 mg/dL (ref 70–99)

## 2018-09-03 LAB — APTT: aPTT: 30 seconds (ref 24–36)

## 2018-09-03 LAB — INFLUENZA PANEL BY PCR (TYPE A & B)
INFLBPCR: NEGATIVE
Influenza A By PCR: NEGATIVE

## 2018-09-03 LAB — TROPONIN I: TROPONIN I: 0.15 ng/mL — AB (ref <0.03)

## 2018-09-03 MED ORDER — BUDESONIDE 0.5 MG/2ML IN SUSP
2.0000 mg | Freq: Four times a day (QID) | RESPIRATORY_TRACT | Status: DC
  Administered 2018-09-03: 0.5 mg via RESPIRATORY_TRACT
  Filled 2018-09-03: qty 8

## 2018-09-03 MED ORDER — NICOTINE 21 MG/24HR TD PT24
21.0000 mg | MEDICATED_PATCH | Freq: Every day | TRANSDERMAL | Status: DC
  Administered 2018-09-04: 21 mg via TRANSDERMAL
  Filled 2018-09-03: qty 1

## 2018-09-03 MED ORDER — IPRATROPIUM-ALBUTEROL 0.5-2.5 (3) MG/3ML IN SOLN
3.0000 mL | Freq: Four times a day (QID) | RESPIRATORY_TRACT | Status: DC
  Administered 2018-09-03: 3 mL via RESPIRATORY_TRACT
  Filled 2018-09-03: qty 3

## 2018-09-03 MED ORDER — IOPAMIDOL (ISOVUE-370) INJECTION 76%
INTRAVENOUS | Status: AC
  Filled 2018-09-03: qty 100

## 2018-09-03 MED ORDER — ONDANSETRON HCL 4 MG/2ML IJ SOLN: 4.0000 mg | Freq: Four times a day (QID) | INTRAMUSCULAR | Status: DC | PRN

## 2018-09-03 MED ORDER — VANCOMYCIN HCL IN DEXTROSE 1-5 GM/200ML-% IV SOLN
1000.0000 mg | Freq: Once | INTRAVENOUS | Status: AC
  Administered 2018-09-03: 1000 mg via INTRAVENOUS
  Filled 2018-09-03: qty 200

## 2018-09-03 MED ORDER — INSULIN ASPART 100 UNIT/ML ~~LOC~~ SOLN: 0.0000 [IU] | SUBCUTANEOUS | Status: DC

## 2018-09-03 MED ORDER — IOPAMIDOL (ISOVUE-370) INJECTION 76%
100.0000 mL | Freq: Once | INTRAVENOUS | Status: AC | PRN
  Administered 2018-09-03: 100 mL via INTRAVENOUS

## 2018-09-03 MED ORDER — ASPIRIN EC 81 MG PO TBEC
81.0000 mg | DELAYED_RELEASE_TABLET | Freq: Every day | ORAL | Status: DC
  Administered 2018-09-04: 81 mg via ORAL
  Filled 2018-09-03: qty 1

## 2018-09-03 MED ORDER — METRONIDAZOLE IN NACL 5-0.79 MG/ML-% IV SOLN
500.0000 mg | Freq: Three times a day (TID) | INTRAVENOUS | Status: DC
  Administered 2018-09-03 – 2018-09-04 (×3): 500 mg via INTRAVENOUS
  Filled 2018-09-03 (×3): qty 100

## 2018-09-03 MED ORDER — CALCITRIOL 0.25 MCG PO CAPS: 0.5000 ug | ORAL_CAPSULE | ORAL | Status: DC

## 2018-09-03 MED ORDER — SODIUM CHLORIDE 0.9 % IV SOLN: 250.0000 mL | INTRAVENOUS | Status: DC | PRN

## 2018-09-03 MED ORDER — ATORVASTATIN CALCIUM 20 MG PO TABS: 20.0000 mg | ORAL_TABLET | Freq: Every day | ORAL | Status: DC

## 2018-09-03 MED ORDER — INSULIN GLARGINE 100 UNIT/ML ~~LOC~~ SOLN
20.0000 [IU] | Freq: Every day | SUBCUTANEOUS | Status: DC
  Filled 2018-09-03: qty 0.2

## 2018-09-03 MED ORDER — ONDANSETRON HCL 4 MG PO TABS: 4.0000 mg | ORAL_TABLET | Freq: Four times a day (QID) | ORAL | Status: DC | PRN

## 2018-09-03 MED ORDER — ALBUTEROL SULFATE (2.5 MG/3ML) 0.083% IN NEBU: 2.5000 mg | INHALATION_SOLUTION | RESPIRATORY_TRACT | Status: DC | PRN

## 2018-09-03 MED ORDER — SODIUM CHLORIDE 0.9 % IV SOLN: 2.0000 g | INTRAVENOUS | Status: DC

## 2018-09-03 MED ORDER — PANTOPRAZOLE SODIUM 40 MG PO TBEC
40.0000 mg | DELAYED_RELEASE_TABLET | Freq: Every day | ORAL | Status: DC
  Administered 2018-09-04: 40 mg via ORAL
  Filled 2018-09-03: qty 1

## 2018-09-03 MED ORDER — SODIUM CHLORIDE 0.9 % IV SOLN
2.0000 g | Freq: Once | INTRAVENOUS | Status: AC
  Administered 2018-09-03: 2 g via INTRAVENOUS
  Filled 2018-09-03: qty 2

## 2018-09-03 MED ORDER — ENOXAPARIN SODIUM 30 MG/0.3ML ~~LOC~~ SOLN
30.0000 mg | SUBCUTANEOUS | Status: DC
  Administered 2018-09-03: 30 mg via SUBCUTANEOUS
  Filled 2018-09-03: qty 0.3

## 2018-09-03 MED ORDER — VANCOMYCIN HCL IN DEXTROSE 500-5 MG/100ML-% IV SOLN: 500.0000 mg | INTRAVENOUS | Status: DC

## 2018-09-03 MED ORDER — SODIUM CHLORIDE 0.9% FLUSH
3.0000 mL | Freq: Two times a day (BID) | INTRAVENOUS | Status: DC
  Administered 2018-09-03 – 2018-09-04 (×2): 3 mL via INTRAVENOUS

## 2018-09-03 MED ORDER — ACETAMINOPHEN 650 MG RE SUPP: 650.0000 mg | Freq: Four times a day (QID) | RECTAL | Status: DC | PRN

## 2018-09-03 MED ORDER — METOCLOPRAMIDE HCL 10 MG PO TABS
5.0000 mg | ORAL_TABLET | Freq: Three times a day (TID) | ORAL | Status: DC
  Administered 2018-09-04: 5 mg via ORAL
  Filled 2018-09-03: qty 1

## 2018-09-03 MED ORDER — SODIUM CHLORIDE 0.9% FLUSH: 3.0000 mL | INTRAVENOUS | Status: DC | PRN

## 2018-09-03 MED ORDER — ACETAMINOPHEN 325 MG PO TABS: 650.0000 mg | ORAL_TABLET | Freq: Four times a day (QID) | ORAL | Status: DC | PRN

## 2018-09-03 MED ORDER — SEVELAMER CARBONATE 800 MG PO TABS
2400.0000 mg | ORAL_TABLET | Freq: Three times a day (TID) | ORAL | Status: DC
  Administered 2018-09-04: 2400 mg via ORAL
  Filled 2018-09-03: qty 3

## 2018-09-03 MED ORDER — ACETAMINOPHEN 325 MG PO TABS
650.0000 mg | ORAL_TABLET | Freq: Once | ORAL | Status: AC
  Administered 2018-09-03: 650 mg via ORAL
  Filled 2018-09-03: qty 2

## 2018-09-03 NOTE — Progress Notes (Signed)
Pharmacy Antibiotic Note  Garrett Salas is a 67 y.o. male admitted on 09/03/2018 HD PT  Plan: Cefepime 2 g TTS Vanc 1 g x 1 then 500 with HD TTS Flagyl 500 q8 Monitor HD schedule, cx, LOT  Height: 5' (152.4 cm) Weight: 109 lb 5.6 oz (49.6 kg) IBW/kg (Calculated) : 50  Temp (24hrs), Avg:100.3 F (37.9 C), Min:100.3 F (37.9 C), Max:100.3 F (37.9 C)  Recent Labs  Lab 09/03/18 1637  LATICACIDVEN 1.95*    Estimated Creatinine Clearance: 5.1 mL/min (A) (by C-G formula based on SCr of 9.92 mg/dL (H)).    No Known Allergies  Levester Fresh, PharmD, BCPS, BCCCP Clinical Pharmacist [phone]  Please check AMION for all [place] numbers  09/03/2018 4:58 PM

## 2018-09-03 NOTE — ED Triage Notes (Signed)
Per [facility]:  Pt presents to ED for assessment of altered mental status from dialysis where he had 2 hours of treatment, miss approx 30 minutes.  Staff states he wasn't acting himself upon arrival, but seemed to deteriorate during treatment.  Wife states patient has had cough/cold symptoms x 2 weeks, not taking any meds.  EMS states lungs are "junky".  AxOx3, in and out of altered en route with EMS.  CBG 88, SpO2 80% on EMS arrival, placed on 3L at 93%

## 2018-09-03 NOTE — ED Notes (Signed)
Patient transported to CT 

## 2018-09-03 NOTE — H&P (Signed)
Mayetta HBZ:[id number] DOB: Aug 16, 1951 DOA: 09/03/2018     PCP: [name], MD  [facility]:    NEphrology:   Dr. Justin Mend      Patient arrived to ER on 09/03/18 at 1539  Patient coming from: home Lives  With family   Chief Complaint:  Chief Complaint  Patient presents with  . Altered Mental Status    HPI: Garrett Salas is a 67 y.o. male with medical history significant of ESRD on HD MWF,  DM2 insulin, bilateral BKA, PVD, hypertension, normal pressure hydrocephalus, elevated troponin, protein calorie malnutrition,    Presented with  Fatigue cough for the past 1 week and increased somnolence.  Reports some wheezing at home.  He usually gets dialyzed on Monday Wednesday Friday but was getting an "extra" Dialysis on Saturday he developed chest pain and increased somnolence. Chest pain states now resolved.  Dates he had prior history of heart disease in the past but this 1 did not feel like it.  Denies pleuritic chest pain.  Patient is not interested in providing detailed history.  Asking me to "stop waking me up" Denies any sick contacts he reports he has had his flu shot denies any nausea vomiting no diarrhea does report cough productive of phlegm worse than usual. Records today he had 2 hours of treatment on dialysis but missed 30 minutes he was not acting himself even prior to initiation of dialysis but then seemed to getting worse as dialysis progressed.  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 History of diabetes with gastroparesis on insulin 20 units of Lantus using Reglan for gastroparesis While in ER: Noted to be febrile up to 100.6 initially very somnolent initial lactic acid 1.9 patient was given 500 mL of fluids and started on IV antibiotics he improved somewhat but continued to be somnolent. VBG showed no evidence of hypercarbia. Troponin slightly elevated ECG results significant changes The following Work up has been ordered so far: Education administrator repeat lactic acid going up to 3.0  To be hypoxic in the emergency department if oxygen saturation going down to 80s now on 2 L satting 100% but intermittently has been hypoxic seems to be worse when he falls asleep   Orders Placed This Encounter  Procedures  . Blood Culture (routine x 2)  . Respiratory Panel by PCR  . DG Chest Portable 1 View  . CBC with Differential  . Comprehensive metabolic panel  . Blood gas, venous  . Lactic acid, plasma  . Troponin I (q 6hr x 3)  . Influenza panel by PCR (type A & B)  . Diet NPO time specified  . Cardiac monitoring  . Refer to Sidebar Report for: Sepsis Bundle ED/IP  . Document vital signs within 1-hour of fluid bolus completion and notify provider of bolus completion  . Document Actual / Estimated Weight  . Insert peripheral IV x 2  . Initiate Carrier Fluid Protocol  .  Height and weight  . Check Rectal Temperature  . Cardiac monitoring  . Call Code Sepsis ([facility] [phone]) Reason for Consult? tracking  . ceFEPime (MAXIPIME) per pharmacy consult  . vancomycin per pharmacy consult  . Consult to hospitalist  [id number]  . Droplet precaution  . Pulse oximetry, continuous  . CBG monitoring, ED  . I-Stat CG4 Lactic Acid, ED  (not at  [facility])  . I-Stat Troponin, ED (not at [facility])  . EKG 12-Lead  . ED EKG  . ED EKG 12-Lead  . Insert peripheral IV  . Place in observation (patient's expected length of stay will  be less than 2 midnights)      Following Medications were ordered in ER: Medications  metroNIDAZOLE (FLAGYL) IVPB 500 mg (0 mg Intravenous Stopped 09/03/18 1831)  vancomycin (VANCOCIN) IVPB 500 mg/100 ml premix (has no administration in time range)  ceFEPIme (MAXIPIME) 2 g in sodium chloride 0.9 % 100 mL IVPB (has no administration in time range)  ceFEPIme (MAXIPIME) 2 g in sodium chloride 0.9 % 100 mL IVPB (0 g Intravenous Stopped 09/03/18 1742)  vancomycin (VANCOCIN) IVPB 1000 mg/200 mL premix (1,000 mg Intravenous New Bag/Given 09/03/18 1742)  acetaminophen (TYLENOL) tablet 650 mg (650 mg Oral Given 09/03/18 1829)    Significant initial  Findings: Abnormal Labs Reviewed  CBC WITH DIFFERENTIAL/PLATELET - Abnormal; Notable for the following components:      Result Value   RDW 15.9 (*)    Platelets 98 (*)    All other components within normal limits  COMPREHENSIVE METABOLIC PANEL - Abnormal; Notable for the following components:   Chloride 96 (*)    CO2 21 (*)    Glucose, Bld 146 (*)    Creatinine, Ser 4.29 (*)    Total Protein 9.1 (*)    Total Bilirubin 1.3 (*)    GFR calc non Af Amer 13 (*)    GFR calc Af Amer 15 (*)    Anion gap 18 (*)    All other components within normal limits  CBG MONITORING, ED - Abnormal; Notable for the following components:   Glucose-Capillary 123 (*)    All other components within normal limits  I-STAT CG4 LACTIC ACID, ED - Abnormal; Notable for the following components:   Lactic Acid, Venous 1.95 (*)    All other components within normal limits   Troponin  0.08  Na 135 K 3.6  Cr   Lab Results  Component Value Date   CREATININE 4.29 (H) 09/03/2018   CREATININE 9.92 (H) 08/18/2018   CREATININE 4.50 (H) 05/24/2015      WBC  7.0  HG/HCT stable,       Component Value Date/Time   HGB 16.8 09/03/2018 1631   HCT 51.8 09/03/2018 1631   HCT 29.5 (L) 05/20/2015 1945      Lactic Acid, Venous    Component Value Date/Time    LATICACIDVEN 1.95 (H) 09/03/2018 1637      UA anuric  CT HEAD non acute  CXR -  NON acute  CT chest  Angio- ordered  ECG:  Personally reviewed by me showing: HR : 91 Rhythm:  NSR left fasicular block  nonspecific changes,  QTC  468    ED Triage Vitals  Enc Vitals Group     BP 09/03/18 1549 (!) 181/84     Pulse Rate 09/03/18 1645 91     Resp 09/03/18 1645 14     Temp 09/03/18 1549 100.3 F (37.9 C)  Temp Source 09/03/18 1549 Oral     SpO2 09/03/18 1645 96 %     Weight 09/03/18 1654 109 lb 5.6 oz (49.6 kg)     Height 09/03/18 1654 5' (1.524 m)     Head Circumference --      Peak Flow --      Pain Score 09/03/18 1549 Asleep     Pain Loc --      Pain Edu? --      Excl. in [place]? --   TMAX(24)@       Latest  Blood pressure (!) 146/54, pulse 93, temperature (!) 100.6 F (38.1 C), temperature source Rectal, resp. rate 18, height 5' (1.524 m), weight 49.6 kg, SpO2 100 %.  VBG 7.385/43  Lactic Acid 1.9-> 3.0   Hospitalist was called for admission for  COPD exacerbation/sepsis   Review of Systems:    Pertinent positives include:  Fevers, chills, fatigue, chest pain, shortness of breath at rest excess mucus,   productive cough, confusion Constitutional:  No weight loss, night sweats, weight loss  HEENT:  No headaches, Difficulty swallowing,Tooth/dental problems,Sore throat,  No sneezing, itching, ear ache, nasal congestion, post nasal drip,  Cardio-vascular:  No Orthopnea, PND, anasarca, dizziness, palpitations.no Bilateral lower extremity swelling  GI:  No heartburn, indigestion, abdominal pain, nausea, vomiting, diarrhea, change in bowel habits, loss of appetite, melena, blood in stool, hematemesis Resp:    No dyspnea on exertion, No No non-productive cough, No coughing up of blood.No change in color of mucus.No wheezing. Skin:  no rash or lesions. No jaundice GU:  no dysuria, change in color of urine, no urgency or frequency. No straining to urinate.  No  flank pain.  Musculoskeletal:  No joint pain or no joint swelling. No decreased range of motion. No back pain.  Psych:  No change in mood or affect. No depression or anxiety. No memory loss.  Neuro: no localizing neurological complaints, no tingling, no weakness, no double vision, no gait abnormality, no slurred speech, no   All systems reviewed and apart from [place] all are negative  Past Medical History:   Past Medical History:  Diagnosis Date  . Diabetes mellitus    Type 2. Diagnosed in mid 72s. Currently insulin requiring  . Diabetic neuropathy ([place])   . Diabetic retinopathy    legally blind  . Dialysis patient ([place])   . ESRD (end stage renal disease) on dialysis ([place])    due to diabetic nephropathy. T/Th/Sat  . Hx of right BKA ([place])   . Hyperlipidemia   . Hypertension   . Peripheral vascular disease ([place])   . Tobacco abuse       Past Surgical History:  Procedure Laterality Date  . ABDOMINAL AORTAGRAM N/A 01/07/2015   Procedure: ABDOMINAL Maxcine Ham;  Surgeon: Conrad Pinedale, MD;  Location: [facility] CATH LAB;  Service: Cardiovascular;  Laterality: N/A;  . AMPUTATION Right 01/09/2015   Procedure: AMPUTATION BELOW KNEE RIGHT LEG;  Surgeon: Conrad Dixie, MD;  Location: [facility];  Service: Vascular;  Laterality: Right;  . AMPUTATION Left 05/16/2015   Procedure: LEFT BELOW THE KNEE AMPUTATION;  Surgeon: Conrad Edinburg, MD;  Location: [facility];  Service: Vascular;  Laterality: Left;  . AV FISTULA PLACEMENT    . CARDIAC CATHETERIZATION  05/06/2011   no significant CAD, EF 55-60%, LVEDP :15   . none      Social History:  Ambulatory       reports that he has been smoking cigarettes. He has  a 30.00 pack-year smoking history. He has never used smokeless tobacco. He reports that he has current or past drug history. Drug: Marijuana. He reports that he does not drink alcohol.     Family History:   Family History  Problem Relation Age of Onset  . Diabetes Mother   . Heart disease Unknown   .  Alcohol abuse Unknown   . Diabetes Sister   . Diabetes Brother     Allergies: No Known Allergies   Prior to Admission medications   Medication Sig Start Date End Date Taking? Authorizing Provider  ACCU-CHEK AVIVA PLUS test strip  01/17/15   [provider]  ACCU-CHEK SOFTCLIX LANCETS lancets  01/16/15   [provider]  albuterol (PROVENTIL HFA;VENTOLIN HFA) 108 (90 BASE) MCG/ACT inhaler Inhale 2 puffs into the lungs every 6 (six) hours as needed for wheezing or shortness of breath.    [provider]  amLODipine (NORVASC) 10 MG tablet Take 1 tablet (10 mg total) by mouth daily. Patient taking differently: Take 5 mg by mouth daily.  05/24/15   Nita Sells, MD  aspirin EC 81 MG tablet Take 81 mg by mouth daily.    [provider]  atorvastatin (LIPITOR) 20 MG tablet Take 1 tablet (20 mg total) by mouth daily at 6 PM. Patient not taking: Reported on 08/18/2018 05/24/15   Nita Sells, MD  atorvastatin (LIPITOR) 40 MG tablet Take 40 mg by mouth daily.    [provider]  B-D UF III MINI PEN NEEDLES 31G X 5 MM MISC  01/21/15   [provider]  Blood Glucose Monitoring Suppl (ACCU-CHEK AVIVA PLUS) W/DEVICE KIT  01/16/15   [provider]  calcitRIOL (ROCALTROL) 0.5 MCG capsule Take 1 capsule (0.5 mcg total) by mouth Every Tuesday,Thursday,and Saturday with dialysis. 05/24/15   Nita Sells, MD  cefTAZidime 2 g in dextrose 5 % 50 mL Inject 2 g into the vein Every Tuesday,Thursday,and Saturday with dialysis. Patient not taking: Reported on 08/18/2018 05/24/15   Nita Sells, MD  cloNIDine (CATAPRES) 0.1 MG tablet Take 0.1 mg by mouth 3 (three) times daily. 12/29/17   [provider]  diphenhydrAMINE (BENADRYL) 25 MG tablet Take 25 mg by mouth as needed for itching.    [provider]  HUMULIN 70/30 KWIKPEN (70-30) 100 UNIT/ML PEN Inject 5-20 Units into the skin 2 (two) times daily as needed (CBG over  175).  11/27/14   [provider]  hydrALAZINE (APRESOLINE) 25 MG tablet Take 1 tablet (25 mg total) by mouth every 6 (six) hours. Patient not taking: Reported on 08/18/2018 05/24/15   Nita Sells, MD  Insulin Glargine (LANTUS SOLOSTAR) 100 UNIT/ML Solostar Pen Inject 20 Units into the skin at bedtime. 06/06/18   [provider]  lisinopril (PRINIVIL,ZESTRIL) 20 MG tablet Take 20 mg by mouth daily.    [provider]  LYRICA 75 MG capsule Take 75 mg by mouth 3 (three) times daily.  04/23/15   [provider]  metoCLOPramide (REGLAN) 5 MG tablet Take 5 mg by mouth 3 (three) times daily as needed for indigestion. 03/18/18   [provider]  pantoprazole (PROTONIX) 40 MG tablet Take 1 tablet (40 mg total) by mouth daily. Patient not taking: Reported on 08/18/2018 05/24/15   Nita Sells, MD  sevelamer (RENVELA) 800 MG tablet Take 2,400 mg by mouth 3 (three) times daily with meals.     [provider]  silver sulfADIAZINE (SILVADENE) 1 % cream Apply 1 application  topically daily. 03/29/15   Bronson Ing, DPM  ULTICARE INSULIN SYRINGE 29G X 1/2" 0.5 ML MISC  04/08/15   [provider]  Vancomycin (VANCOCIN) 750 MG/150ML SOLN Inject 150 mLs (750 mg total) into the vein Every Tuesday,Thursday,and Saturday with dialysis. Patient not taking: Reported on 08/18/2018 05/24/15   Nita Sells, MD   Physical Exam: Blood pressure (!) 146/54, pulse 93, temperature (!) 100.6 F (38.1 C), temperature source Rectal, resp. rate 18, height 5' (1.524 m), weight 49.6 kg, SpO2 100 %. 1. General:  in No Acute distress   Chronically ill  -appearing 2. Psychological:somnolent but arousable simple to verbal stimulation oriented to self place not year 3. Head/ENT:    Dry Mucous Membranes                          Head Non traumatic, neck supple                            Poor Dentition 4. SKIN:   decreased Skin turgor,  Skin clean Dry and intact  no rash 5. Heart: Regular rate and rhythm no  Murmur, no Rub or gallop 6. Lungs:occasional  wheezes and crackles   7. Abdomen: Soft,  non-tender, Non distended  bowel sounds present 8. Lower extremities: no clubbing, cyanosis, or edema bilateral BKA 9. Neurologically Grossly intact, moving all 4 extremities equally   10. MSK: Normal range of motion   LABS:     Recent Labs  Lab 09/03/18 1631  WBC 7.0  NEUTROABS 5.9  HGB 16.8  HCT 51.8  MCV 91.2  PLT 98*   Basic Metabolic Panel: Recent Labs  Lab 09/03/18 1631  NA 135  K 3.6  CL 96*  CO2 21*  GLUCOSE 146*  BUN 23  CREATININE 4.29*  CALCIUM 9.8      Recent Labs  Lab 09/03/18 1631  AST 33  ALT 21  ALKPHOS 106  BILITOT 1.3*  PROT 9.1*  ALBUMIN 3.8   No results for input(s): LIPASE, AMYLASE in the last 168 hours. No results for input(s): AMMONIA in the last 168 hours.    HbA1C: No results for input(s): HGBA1C in the last 72 hours. CBG: Recent Labs  Lab 09/03/18 1611  GLUCAP 123*      Urine analysis: No results found for: COLORURINE, APPEARANCEUR, LABSPEC, PHURINE, GLUCOSEU, HGBUR, BILIRUBINUR, KETONESUR, PROTEINUR, UROBILINOGEN, NITRITE, LEUKOCYTESUR     Cultures:    Component Value Date/Time   SDES BLOOD LEFT ARM 05/14/2015 1536   SPECREQUEST BOTTLES DRAWN AEROBIC AND ANAEROBIC 5CC 05/14/2015 1536   CULT NO GROWTH 5 DAYS 05/14/2015 1536   REPTSTATUS 05/19/2015 FINAL 05/14/2015 1536     Radiological Exams on Admission: Dg Chest Portable 1 View  Result Date: 09/03/2018 CLINICAL DATA:  Altered mental status. Cough and cold symptoms for 2 weeks. EXAM: PORTABLE CHEST 1 VIEW COMPARISON:  08/18/2018 FINDINGS: Normal heart size. No pleural effusion or edema. Aortic atherosclerosis. No airspace opacities. The visualized osseous structures are unremarkable. IMPRESSION: 1. No active disease 2.  Aortic Atherosclerosis (ICD10-I70.0). Electronically Signed   By: Kerby Moors M.D.   On: 09/03/2018 16:31     Chart has been reviewed    Assessment/Plan   67 y.o. male with medical history significant of ESRD on HD MWF,  DM2 insulin, bilateral BKA, PVD, hypertension, normal pressure hydrocephalus, elevated troponin, protein calorie malnutrition, Admitted for sepsis, COPD exacerbation, acute encephalopathy  and chest pain  Present on Admission: . Acute encephalopathy -   - most likely multifactorial secondary to combination of  infection  And hypoxia  - in ER got 545m    - treat underlining infection   - Hold contributing medications , narcotics as patient has had negative outcomes with that in the past  - if no improvement may need further imaging to evaluate for CNS pathology pathology such as MRI of the brain  Given history of normal pressure hydrocephalus will obtain CT of the head to see if there is any progression  - neurological exam appears to be nonfocal but patient unable to cooperate fully   - VBG unremarkable no evidence of hypercarbia    - no history of liver disease   . Hypoxia -chest x-ray showing no evidence of infectious process unsure if poor reading currently appears to be satting 100% on 2 L.  Given chest pain will obtain CT Angie of the chest to rule out PE d-dimer would be positive given extensive comorbidities . Chest pain -unclear etiology patient does not provide detailed history we will cycle cardiac enzymes first troponin somewhat elevated at 0.08 currently chest pain-free EKG without acute changes continue to cycle cardiac enzymes and monitor carefully.  Notify cardiology of patient's been admitted . COPD exacerbation ([place]  -  -  - Will initiate: inhaled steroid for now given mild to moderate severity  -  Antibiotics given sepsis , - Albuterol  PRN, - scheduled duoneb,  -  Breo or Dulera at discharge   -  Mucinex.  Titrate O2 to saturation >90%. Follow patients respiratory status.  Order respiratory panel and influenza PCR    Currently mentating well no  evidence of symptomatic hypercarbia  . Elevated troponin -likely demand ischemia but will often evaluate for any possibility of PE need to cycle if continues to rise will need heparin and cardiology consult tonight otherwise we will email him to see in a.m.   .Marland KitchenSepsis ([place] -unclear source lactic acid has been going up will monitor in stepdown overnight given severe sepsis with associated endorgan damage such as elevation in lactic acid and troponin and acute encephalopathy.  Continue broad-spectrum IV antibiotics.  Judicial use of IV fluids given end-stage renal disease and no evidence of hypotension Await results of blood cultures patient is anuric chest x-ray not showing any evidence of pneumonia  . Tobacco abuse - - Spoke about importance of quitting spent 5 minutes discussing options for treatment, prior attempts at quitting, and dangers of smoking  -At this point patient is NOT  interested in quitting  - order nicotine patch   - nursing tobacco cessation protocol  . Protein-calorie malnutrition, severe ([place] -order nutritional consult and prealbumin . PAD (peripheral artery disease) (HCC) -continue aspirin . Hypertension -given sepsis allow some permissive hypertension for now we will restart home medications when able . HLD (hyperlipidemia) stable continue home medications . DM (diabetes mellitus) type II, controlled, with peripheral vascular disorder ([place] -  - Order Sensitive SSI   - continue home insulin regimen   Lantus 20 units,  -  check TSH and HgA1C   ESRD-hemodialysis Monday Wednesday Friday if patient is here on Monday will need nephrology consult otherwise continue current hemodialysis regimen    Other plan as per orders.  DVT prophylaxis:    Lovenox     Code Status:  FULL CODE  per patient   I had personally discussed CODE STATUS with patient  Family Communication:   Family not  at  Bedside    Disposition Plan:     To home once workup is complete and patient  is stable                     Would benefit from PT/OT eval prior to DC  Ordered                                    Nutrition    consulted                  Consults called: none  Admission status:  Obs    Level of care        SDU tele indefinitely please discontinue once patient no longer qualifies        Mechele Kittleson 09/03/2018, 8:10 PM    [facility]  Pager [phone]   after 2 AM please page floor coverage PA If 7AM-7PM, please contact the day team taking care of the patient  [email or web address]  Password [email or web address]

## 2018-09-03 NOTE — Progress Notes (Signed)
CRITICAL VALUE ALERT  Critical Value:  Trop 0.15  Date & Time Notied:  09/03/18 2317  Provider Notified: Roel Cluck   Orders Received/Actions taken: RN waiting for new orders.

## 2018-09-03 NOTE — ED Provider Notes (Signed)
[facility] EMERGENCY DEPARTMENT Provider Note   CSN: [id number] Arrival date & time: 09/03/18  1539     History   Chief Complaint Chief Complaint  Patient presents with  . Altered Mental Status    HPI Garrett Salas is a 67 y.o. male.  67 y/o male with a PMH of DM, HTN, Hyperlipidemia, ESRD, right BKA presents to the ED with a chief complaint of AMS. Patient is he was at dialysis this morning and began to feel ill.  Reports he felt a pressure on his chest with no radiation.  Also reports he is had a cough for the past 2 weeks, unable to produce sputum, he also reports some shortness of breath.The shortness of breath is worst with walking. Patient was recently seen in the ED for weakness along with nausea and vomiting.Patient is currently on dialysis and does not produce urine. Upon arrival patient was arousable but had slow responses, history was limited.      Past Medical History:  Diagnosis Date  . Diabetes mellitus    Type 2. Diagnosed in mid 51s. Currently insulin requiring  . Diabetic neuropathy ([place])   . Diabetic retinopathy    legally blind  . Dialysis patient ([place])   . ESRD (end stage renal disease) on dialysis ([place])    due to diabetic nephropathy. T/Th/Sat  . Hx of right BKA ([place])   . Hyperlipidemia   . Hypertension   . Peripheral vascular disease ([place])   . Tobacco abuse     Patient Active Problem List   Diagnosis Date Noted  . Phantom limb pain- Left stump 06/21/2015  . Postoperative anemia due to acute blood loss 05/18/2015  . Protein-calorie malnutrition, severe ([place]) 05/17/2015  . Critical lower limb ischemia   . ESRD on hemodialysis ([place])   . Hypertensive urgency   . Acute encephalopathy   . Elevated troponin   . Generalized weakness 05/14/2015  . Gangrene of foot ([place]) 05/14/2015  . Hypertensive heart disease 05/14/2015  . Gangrene of lower extremity ([place]) 05/14/2015  . S/P BKA (below knee amputation) bilateral ([place]) 02/07/2015    . PAD (peripheral artery disease) ([place]) 01/07/2015  . ESRD on dialysis ([place]) 01/04/2015  . HLD (hyperlipidemia) 01/04/2015  . DM (diabetes mellitus) type II controlled peripheral vascular disorder 01/04/2015  . Atherosclerosis of native arteries of the extremities with gangrene ([place]) 01/04/2015  . Dyspnea 05/18/2011  . Hypertension   . Tobacco abuse     Past Surgical History:  Procedure Laterality Date  . ABDOMINAL AORTAGRAM N/A 01/07/2015   Procedure: ABDOMINAL Maxcine Ham;  Surgeon: Conrad Jacksonburg, MD;  Location: [facility] CATH LAB;  Service: Cardiovascular;  Laterality: N/A;  . AMPUTATION Right 01/09/2015   Procedure: AMPUTATION BELOW KNEE RIGHT LEG;  Surgeon: Conrad Skidmore, MD;  Location: [facility];  Service: Vascular;  Laterality: Right;  . AMPUTATION Left 05/16/2015   Procedure: LEFT BELOW THE KNEE AMPUTATION;  Surgeon: Conrad Strasburg, MD;  Location: [facility];  Service: Vascular;  Laterality: Left;  . AV FISTULA PLACEMENT    . CARDIAC CATHETERIZATION  05/06/2011   no significant CAD, EF 55-60%, LVEDP :15   . none          Home Medications    Prior to Admission medications   Medication Sig Start Date End Date Taking? Authorizing Provider  ACCU-CHEK AVIVA PLUS test strip  01/17/15   [provider]  ACCU-CHEK SOFTCLIX LANCETS lancets  01/16/15   [provider]  albuterol (PROVENTIL  HFA;VENTOLIN HFA) 108 (90 BASE) MCG/ACT inhaler Inhale 2 puffs into the lungs every 6 (six) hours as needed for wheezing or shortness of breath.    [provider]  amLODipine (NORVASC) 10 MG tablet Take 1 tablet (10 mg total) by mouth daily. Patient taking differently: Take 5 mg by mouth daily.  05/24/15   Nita Sells, MD  aspirin EC 81 MG tablet Take 81 mg by mouth daily.    [provider]  atorvastatin (LIPITOR) 20 MG tablet Take 1 tablet (20 mg total) by mouth daily at 6 PM. Patient not taking: Reported on 08/18/2018 05/24/15   Nita Sells, MD  atorvastatin (LIPITOR)  40 MG tablet Take 40 mg by mouth daily.    [provider]  B-D UF III MINI PEN NEEDLES 31G X 5 MM MISC  01/21/15   [provider]  Blood Glucose Monitoring Suppl (ACCU-CHEK AVIVA PLUS) W/DEVICE KIT  01/16/15   [provider]  calcitRIOL (ROCALTROL) 0.5 MCG capsule Take 1 capsule (0.5 mcg total) by mouth Every Tuesday,Thursday,and Saturday with dialysis. 05/24/15   Nita Sells, MD  cefTAZidime 2 g in dextrose 5 % 50 mL Inject 2 g into the vein Every Tuesday,Thursday,and Saturday with dialysis. Patient not taking: Reported on 08/18/2018 05/24/15   Nita Sells, MD  cloNIDine (CATAPRES) 0.1 MG tablet Take 0.1 mg by mouth 3 (three) times daily. 12/29/17   [provider]  diphenhydrAMINE (BENADRYL) 25 MG tablet Take 25 mg by mouth as needed for itching.    [provider]  HUMULIN 70/30 KWIKPEN (70-30) 100 UNIT/ML PEN Inject 5-20 Units into the skin 2 (two) times daily as needed (CBG over 175).  11/27/14   [provider]  hydrALAZINE (APRESOLINE) 25 MG tablet Take 1 tablet (25 mg total) by mouth every 6 (six) hours. Patient not taking: Reported on 08/18/2018 05/24/15   Nita Sells, MD  Insulin Glargine (LANTUS SOLOSTAR) 100 UNIT/ML Solostar Pen Inject 20 Units into the skin at bedtime. 06/06/18   [provider]  lisinopril (PRINIVIL,ZESTRIL) 20 MG tablet Take 20 mg by mouth daily.    [provider]  LYRICA 75 MG capsule Take 75 mg by mouth 3 (three) times daily.  04/23/15   [provider]  metoCLOPramide (REGLAN) 5 MG tablet Take 5 mg by mouth 3 (three) times daily as needed for indigestion. 03/18/18   [provider]  pantoprazole (PROTONIX) 40 MG tablet Take 1 tablet (40 mg total) by mouth daily. Patient not taking: Reported on 08/18/2018 05/24/15   Nita Sells, MD  sevelamer (RENVELA) 800 MG tablet Take 2,400 mg by mouth 3 (three) times daily with meals.     [provider]  silver  sulfADIAZINE (SILVADENE) 1 % cream Apply 1 application topically daily. 03/29/15   Bronson Ing, DPM  ULTICARE INSULIN SYRINGE 29G X 1/2" 0.5 ML MISC  04/08/15   [provider]  Vancomycin (VANCOCIN) 750 MG/150ML SOLN Inject 150 mLs (750 mg total) into the vein Every Tuesday,Thursday,and Saturday with dialysis. Patient not taking: Reported on 08/18/2018 05/24/15   Nita Sells, MD    Family History Family History  Problem Relation Age of Onset  . Diabetes Mother   . Heart disease Unknown   . Alcohol abuse Unknown   . Diabetes Sister   . Diabetes Brother     Social History Social History   Tobacco Use  . Smoking status: Light Tobacco Smoker    Packs/day: 1.00    Years: 30.00  Pack years: 30.00    Types: Cigarettes  . Smokeless tobacco: Never Used  Substance Use Topics  . Alcohol use: No    Alcohol/week: 0.0 standard drinks  . Drug use: Yes    Types: Marijuana     Allergies   Patient has no known allergies.   Review of Systems Review of Systems  Unable to perform ROS: Other  Respiratory: Positive for cough and shortness of breath.   Cardiovascular: Positive for chest pain.     Physical Exam Updated Vital Signs BP (!) 146/54   Pulse 93   Temp (!) 100.6 F (38.1 C) (Rectal)   Resp 18   Ht 5' (1.524 m)   Wt 49.6 kg   SpO2 100%   BMI 21.36 kg/m   Physical Exam  Constitutional: He appears well-developed and well-nourished.  Ill appearing  HENT:  Head: Normocephalic and atraumatic.  Neck: Normal range of motion. Neck supple.  Cardiovascular: Normal heart sounds.  Pulmonary/Chest: No respiratory distress. He has decreased breath sounds. He has no rales. He exhibits no tenderness.  On 2L [place], stating at 99%   Abdominal: Soft. There is no tenderness.  Musculoskeletal: He exhibits no tenderness.  Neurological: He is alert.  Skin: Skin is warm and dry.  Nursing note and vitals reviewed.    ED Treatments / Results  Labs (all labs  ordered are listed, but only abnormal results are displayed) Labs Reviewed  CBC WITH DIFFERENTIAL/PLATELET - Abnormal; Notable for the following components:      Result Value   RDW 15.9 (*)    Platelets 98 (*)    All other components within normal limits  COMPREHENSIVE METABOLIC PANEL - Abnormal; Notable for the following components:   Chloride 96 (*)    CO2 21 (*)    Glucose, Bld 146 (*)    Creatinine, Ser 4.29 (*)    Total Protein 9.1 (*)    Total Bilirubin 1.3 (*)    GFR calc non Af Amer 13 (*)    GFR calc Af Amer 15 (*)    Anion gap 18 (*)    All other components within normal limits  CBG MONITORING, ED - Abnormal; Notable for the following components:   Glucose-Capillary 123 (*)    All other components within normal limits  I-STAT CG4 LACTIC ACID, ED - Abnormal; Notable for the following components:   Lactic Acid, Venous 1.95 (*)    All other components within normal limits  CULTURE, BLOOD (ROUTINE X 2)  CULTURE, BLOOD (ROUTINE X 2)  URINALYSIS, ROUTINE W REFLEX MICROSCOPIC  BLOOD GAS, VENOUS  I-STAT CG4 LACTIC ACID, ED  I-STAT TROPONIN, ED    EKG EKG Interpretation  Date/Time:  Saturday September 03 2018 16:07:12 EDT Ventricular Rate:  91 PR Interval:    QRS Duration: 98 QT Interval:  380 QTC Calculation: 468 R Axis:   -46 Text Interpretation:  Sinus rhythm Left anterior fascicular block LVH with secondary repolarization abnormality Anterior ST elevation, probably due to LVH No significant change since last tracing Confirmed by Blanchie Dessert [phone]) on 09/03/2018 4:17:28 PM Also confirmed by Blanchie Dessert [phone]), editor Philomena Doheny [phone])  on 09/03/2018 4:20:09 PM   Radiology Dg Chest Portable 1 View  Result Date: 09/03/2018 CLINICAL DATA:  Altered mental status. Cough and cold symptoms for 2 weeks. EXAM: PORTABLE CHEST 1 VIEW COMPARISON:  08/18/2018 FINDINGS: Normal heart size. No pleural effusion or edema. Aortic atherosclerosis. No airspace  opacities. The visualized osseous structures are unremarkable. IMPRESSION: 1.  No active disease 2.  Aortic Atherosclerosis (ICD10-I70.0). Electronically Signed   By: Kerby Moors M.D.   On: 09/03/2018 16:31    Procedures Procedures (including critical care time)  Medications Ordered in ED Medications  metroNIDAZOLE (FLAGYL) IVPB 500 mg (0 mg Intravenous Stopped 09/03/18 1831)  vancomycin (VANCOCIN) IVPB 1000 mg/200 mL premix (1,000 mg Intravenous New Bag/Given 09/03/18 1742)  vancomycin (VANCOCIN) IVPB 500 mg/100 ml premix (has no administration in time range)  ceFEPIme (MAXIPIME) 2 g in sodium chloride 0.9 % 100 mL IVPB (has no administration in time range)  ceFEPIme (MAXIPIME) 2 g in sodium chloride 0.9 % 100 mL IVPB (0 g Intravenous Stopped 09/03/18 1742)  acetaminophen (TYLENOL) tablet 650 mg (650 mg Oral Given 09/03/18 1829)     Initial Impression / Assessment and Plan / ED Course  I have reviewed the triage vital signs and the nursing notes.  Pertinent labs & imaging results that were available during my care of the patient were reviewed by me and considered in my medical decision making (see chart for details).    Patient presents with cough, shortness of breath on dialysis. Patient Seen in the ED on 10/3 for weakness, fatigue along with abdominal pain.  She was discharged home with follow-up but patient reports he is now feeling worse.  His work-up today in the ED showed a decrease in his platelets at 98.  CMP showed creatinine of 4.29, patient was dialyzed this morning moved here from his previous visit at 9.  EGD was 123.  This work-up was obtained as patient presented with a heart rate of 81 and a fever of 100.3. DG chest 2 view showed no acute abnormality, it was within normal limits.No pleural effusion or pneumothorax.  EKG was unchanged from previous.  Patient was started on broad-spectrum antibiotics for broad coverage of fever from unknown source.  Blood cultures were also  obtained along with urinalysis and lactic acid. At this time will call to place admission for fever of unknown origin and sepsis.  6:08 PM Consult placed to hospitalist.  6:37 PM Spoke to hospitalist will discontinue patient's O2 along with add a VBG and troponin, she will come see and evaluate patient.   Final Clinical Impressions(s) / ED Diagnoses   Final diagnoses:  None    ED Discharge Orders    None       Janeece Fitting, PA-C 09/03/18 1837    Blanchie Dessert, MD 09/03/18 2203

## 2018-09-03 NOTE — ED Notes (Signed)
Delay in vanc admin due to pt is a hard stick and vanc not compatible with cefepime. Will hang once cefepime is completed

## 2018-09-04 ENCOUNTER — Ambulatory Visit (HOSPITAL_COMMUNITY): Payer: Medicare Other | Attending: Internal Medicine

## 2018-09-04 DIAGNOSIS — G9340 Encephalopathy, unspecified: Secondary | ICD-10-CM | POA: Diagnosis not present

## 2018-09-04 DIAGNOSIS — E785 Hyperlipidemia, unspecified: Secondary | ICD-10-CM | POA: Diagnosis not present

## 2018-09-04 DIAGNOSIS — J1289 Other viral pneumonia: Secondary | ICD-10-CM | POA: Diagnosis not present

## 2018-09-04 DIAGNOSIS — G92 Toxic encephalopathy: Secondary | ICD-10-CM | POA: Diagnosis not present

## 2018-09-04 DIAGNOSIS — E1122 Type 2 diabetes mellitus with diabetic chronic kidney disease: Secondary | ICD-10-CM | POA: Diagnosis not present

## 2018-09-04 DIAGNOSIS — I120 Hypertensive chronic kidney disease with stage 5 chronic kidney disease or end stage renal disease: Secondary | ICD-10-CM | POA: Diagnosis not present

## 2018-09-04 DIAGNOSIS — E43 Unspecified severe protein-calorie malnutrition: Secondary | ICD-10-CM | POA: Diagnosis not present

## 2018-09-04 DIAGNOSIS — E1151 Type 2 diabetes mellitus with diabetic peripheral angiopathy without gangrene: Secondary | ICD-10-CM | POA: Diagnosis not present

## 2018-09-04 DIAGNOSIS — G912 (Idiopathic) normal pressure hydrocephalus: Secondary | ICD-10-CM | POA: Diagnosis not present

## 2018-09-04 DIAGNOSIS — R079 Chest pain, unspecified: Secondary | ICD-10-CM | POA: Diagnosis not present

## 2018-09-04 DIAGNOSIS — A419 Sepsis, unspecified organism: Secondary | ICD-10-CM | POA: Diagnosis not present

## 2018-09-04 DIAGNOSIS — N186 End stage renal disease: Secondary | ICD-10-CM | POA: Diagnosis not present

## 2018-09-04 DIAGNOSIS — R7989 Other specified abnormal findings of blood chemistry: Secondary | ICD-10-CM | POA: Diagnosis not present

## 2018-09-04 DIAGNOSIS — B9789 Other viral agents as the cause of diseases classified elsewhere: Secondary | ICD-10-CM | POA: Diagnosis not present

## 2018-09-04 LAB — GLUCOSE, CAPILLARY
GLUCOSE-CAPILLARY: 110 mg/dL — AB (ref 70–99)
GLUCOSE-CAPILLARY: 87 mg/dL (ref 70–99)

## 2018-09-04 LAB — RESPIRATORY PANEL BY PCR
Adenovirus: NOT DETECTED
BORDETELLA PERTUSSIS-RVPCR: NOT DETECTED
Chlamydophila pneumoniae: NOT DETECTED
Coronavirus 229E: NOT DETECTED
Coronavirus HKU1: NOT DETECTED
Coronavirus NL63: NOT DETECTED
Coronavirus OC43: NOT DETECTED
INFLUENZA A-RVPPCR: NOT DETECTED
INFLUENZA B-RVPPCR: NOT DETECTED
METAPNEUMOVIRUS-RVPPCR: NOT DETECTED
Mycoplasma pneumoniae: NOT DETECTED
PARAINFLUENZA VIRUS 2-RVPPCR: NOT DETECTED
PARAINFLUENZA VIRUS 4-RVPPCR: NOT DETECTED
Parainfluenza Virus 1: NOT DETECTED
Parainfluenza Virus 3: NOT DETECTED
RESPIRATORY SYNCYTIAL VIRUS-RVPPCR: NOT DETECTED
Rhinovirus / Enterovirus: DETECTED — AB

## 2018-09-04 LAB — BLOOD CULTURE ID PANEL (REFLEXED)
ACINETOBACTER BAUMANNII: NOT DETECTED
CANDIDA ALBICANS: NOT DETECTED
CANDIDA TROPICALIS: NOT DETECTED
Candida glabrata: NOT DETECTED
Candida krusei: NOT DETECTED
Candida parapsilosis: NOT DETECTED
ENTEROBACTERIACEAE SPECIES: NOT DETECTED
Enterobacter cloacae complex: NOT DETECTED
Enterococcus species: NOT DETECTED
Escherichia coli: NOT DETECTED
Haemophilus influenzae: NOT DETECTED
KLEBSIELLA PNEUMONIAE: NOT DETECTED
Klebsiella oxytoca: NOT DETECTED
Listeria monocytogenes: NOT DETECTED
Methicillin resistance: DETECTED — AB
NEISSERIA MENINGITIDIS: NOT DETECTED
Proteus species: NOT DETECTED
Pseudomonas aeruginosa: NOT DETECTED
SERRATIA MARCESCENS: NOT DETECTED
STAPHYLOCOCCUS AUREUS BCID: NOT DETECTED
STREPTOCOCCUS PYOGENES: NOT DETECTED
STREPTOCOCCUS SPECIES: NOT DETECTED
Staphylococcus species: DETECTED — AB
Streptococcus agalactiae: NOT DETECTED
Streptococcus pneumoniae: NOT DETECTED

## 2018-09-04 LAB — COMPREHENSIVE METABOLIC PANEL
ALT: 18 U/L (ref 0–44)
AST: 26 U/L (ref 15–41)
Albumin: 3.2 g/dL — ABNORMAL LOW (ref 3.5–5.0)
Alkaline Phosphatase: 76 U/L (ref 38–126)
Anion gap: 18 — ABNORMAL HIGH (ref 5–15)
BUN: 32 mg/dL — ABNORMAL HIGH (ref 8–23)
CHLORIDE: 96 mmol/L — AB (ref 98–111)
CO2: 20 mmol/L — ABNORMAL LOW (ref 22–32)
Calcium: 8.6 mg/dL — ABNORMAL LOW (ref 8.9–10.3)
Creatinine, Ser: 5.16 mg/dL — ABNORMAL HIGH (ref 0.61–1.24)
GFR, EST AFRICAN AMERICAN: 12 mL/min — AB (ref >60)
GFR, EST NON AFRICAN AMERICAN: 11 mL/min — AB (ref >60)
Glucose, Bld: 112 mg/dL — ABNORMAL HIGH (ref 70–99)
POTASSIUM: 3.4 mmol/L — AB (ref 3.5–5.1)
SODIUM: 134 mmol/L — AB (ref 135–145)
TOTAL PROTEIN: 7.4 g/dL (ref 6.5–8.1)
Total Bilirubin: 1 mg/dL (ref 0.3–1.2)

## 2018-09-04 LAB — CBC
HEMATOCRIT: 43.5 % (ref 39.0–52.0)
HEMOGLOBIN: 13.3 g/dL (ref 13.0–17.0)
MCH: 28.8 pg (ref 26.0–34.0)
MCHC: 30.6 g/dL (ref 30.0–36.0)
MCV: 94.2 fL (ref 80.0–100.0)
NRBC: 0 % (ref 0.0–0.2)
Platelets: 88 10*3/uL — ABNORMAL LOW (ref 150–400)
RBC: 4.62 MIL/uL (ref 4.22–5.81)
RDW: 15.9 % — AB (ref 11.5–15.5)
WBC: 7.8 10*3/uL (ref 4.0–10.5)

## 2018-09-04 LAB — HIV ANTIBODY (ROUTINE TESTING W REFLEX): HIV Screen 4th Generation wRfx: NONREACTIVE

## 2018-09-04 LAB — PHOSPHORUS: Phosphorus: 4.5 mg/dL (ref 2.5–4.6)

## 2018-09-04 LAB — MAGNESIUM: MAGNESIUM: 1.9 mg/dL (ref 1.7–2.4)

## 2018-09-04 LAB — PREALBUMIN: PREALBUMIN: 15 mg/dL — AB (ref 18–38)

## 2018-09-04 LAB — HEMOGLOBIN A1C
Hgb A1c MFr Bld: 4.9 % (ref 4.8–5.6)
Mean Plasma Glucose: 93.93 mg/dL

## 2018-09-04 LAB — PROCALCITONIN: Procalcitonin: 0.97 ng/mL

## 2018-09-04 LAB — TSH: TSH: 0.949 u[IU]/mL (ref 0.350–4.500)

## 2018-09-04 LAB — TROPONIN I: Troponin I: 0.15 ng/mL (ref <0.03)

## 2018-09-04 MED ORDER — IPRATROPIUM-ALBUTEROL 0.5-2.5 (3) MG/3ML IN SOLN: 3.0000 mL | Freq: Four times a day (QID) | RESPIRATORY_TRACT | Status: DC | PRN

## 2018-09-04 NOTE — Progress Notes (Signed)
Attempted to do the patient's echo at 8:15am.  Patient had the desire to eat breakfast prior to the exam.  As a result, exam delayed and will be attempted at a later time.  Nurse notified.

## 2018-09-04 NOTE — Discharge Summary (Addendum)
Addendum - post discharge received a phone call from micro lab that 1 out of 2 sets of blood cultures growing coag negative staph, patient is symptom-free, I called talk to him while he was at home on his cell phone number.  He is afebrile and at his baseline.  He was instructed that if he runs a fever or he has any discomfort around his dialysis access site to come back to the ER.  This likely is contamination.    [place] MVV:[id number] DOB: 26-Aug-1951 DOA: 09/03/2018  PCP: [name], MD  Admit date: 09/03/2018  Discharge date: 09/04/2018  Admitted From: Home  Disposition:  Home   Recommendations for Outpatient Follow-up: Discharge received a call from micro lab that 1 out of 2 sets of blood cultures growing coag negative staph, patient does not have a pacemaker  Follow up with PCP in 1-2 weeks  PCP Please obtain BMP/CBC, 2 view CXR in 1week,  (see Discharge instructions)   PCP Please follow up on the following pending results:    Home Health: None  Equipment/Devices: None  Consultations: None Discharge Condition: Fair   CODE STATUS: Full Diet Recommendation: Renal, low carbohydrate.  Chief Complaint  Patient presents with  . Altered Mental Status     Brief history of present illness from the day of admission and additional interim summary    Garrett Salas is a 67 y.o. male with medical history significant of ESRD on HD MWF,  DM2 insulin, bilateral BKA, PVD, hypertension, normal pressure hydrocephalus, chronically elevated troponin, protein calorie malnutrition, presented with fever fatigue and cough for the last [redacted] week along with some somnolence.  He was diagnosed with rhinovirus pneumonia causing mild toxic encephalopathy.  His troponin was at its chronic elevated level.                                                        Hospital Course    Acute toxic encephalopathy caused by rhinovirus pneumonia/URI.  Treated with supportive care back to baseline, no headache head CT unremarkable, no focal deficits, patient symptom-free eager to be discharged home, appears nontoxic, no headache.  Will be discharged home with supportive care with PCP follow-up.  No oxygen demand at this time.  Chronically elevated troponin.  At chronic baseline level.  Trend was flat, EKG nonacute no chest pain.  No further intervention.  This was not an NSTEMI.  ESRD.  Dialysis per schedule.  M, W and F schedule.  Bilateral BKA, has prosthesis.  Supportive care.  DM type II.  Follow home regimen unchanged.  Essential hypertension.  Normal pressure hydrocephalus.  Home regimen continued.  No acute issues.    Discharge diagnosis     Active Problems:   Tobacco abuse   Hypertension   HLD (hyperlipidemia)   DM (diabetes mellitus) type II, controlled,  with peripheral vascular disorder (HCC)   PAD (peripheral artery disease) (HCC)   S/P BKA (below knee amputation) bilateral (HCC)   ESRD on hemodialysis ([place])   Acute encephalopathy   Elevated troponin   Protein-calorie malnutrition, severe ([place])   Sepsis ([place])   Hypoxia   Chest pain   COPD exacerbation [facility])    Discharge instructions    Discharge Instructions    Discharge instructions   Complete by:  As directed    Wear a facemask when around people for the next 1 week.  Get 3 facemasks form the nurse before you leave the hospital.  Follow with Primary MD [name], MD in 7 days   Get CBC, CMP, 2 view Chest X ray -  checked  by Primary MD  5-7 days    Activity: As tolerated with Full fall precautions use walker/cane & assistance as needed  Disposition Home    Diet: Renal Low Carb, diet with 1.5 L/day total fluid restriction.  Accuchecks 4 times/day, Once in AM empty stomach and then before each meal. Log  in all results and show them to your Prim.MD in 3 days. If any glucose reading is under 80 or above 300 call your Prim MD immidiately. Follow Low glucose instructions for glucose under 80 as instructed.   Special Instructions: If you have smoked or chewed Tobacco  in the last 2 yrs please stop smoking, stop any regular Alcohol  and or any Recreational drug use.  On your next visit with your primary care physician please Get Medicines reviewed and adjusted.  Please request your Prim.MD to go over all Hospital Tests and Procedure/Radiological results at the follow up, please get all Hospital records sent to your Prim MD by signing hospital release before you go home.  If you experience worsening of your admission symptoms, develop shortness of breath, life threatening emergency, suicidal or homicidal thoughts you must seek medical attention immediately by calling 911 or calling your MD immediately  if symptoms less severe.  You Must read complete instructions/literature along with all the possible adverse reactions/side effects for all the Medicines you take and that have been prescribed to you. Take any new Medicines after you have completely understood and accpet all the possible adverse reactions/side effects.   Increase activity slowly   Complete by:  As directed       Discharge Medications   Allergies as of 09/04/2018   No Known Allergies     Medication List    TAKE these medications   ACCU-CHEK AVIVA PLUS test strip Generic drug:  glucose blood   ACCU-CHEK AVIVA PLUS w/Device Kit   ACCU-CHEK SOFTCLIX LANCETS lancets   albuterol 108 (90 Base) MCG/ACT inhaler Commonly known as:  PROVENTIL HFA;VENTOLIN HFA Inhale 2 puffs into the lungs every 6 (six) hours as needed for wheezing or shortness of breath.   amLODipine 10 MG tablet Commonly known as:  NORVASC Take 1 tablet (10 mg total) by mouth daily.   atorvastatin 40 MG tablet Commonly known as:  LIPITOR Take 40 mg by  mouth daily.   atorvastatin 20 MG tablet Commonly known as:  LIPITOR Take 1 tablet (20 mg total) by mouth daily at 6 PM.   B-D UF III MINI PEN NEEDLES 31G X 5 MM Misc Generic drug:  Insulin Pen Needle   calcitRIOL 0.5 MCG capsule Commonly known as:  ROCALTROL Take 1 capsule (0.5 mcg total) by mouth Every Tuesday,Thursday,and Saturday with dialysis.   cloNIDine 0.1 MG tablet  Commonly known as:  CATAPRES Take 0.1 mg by mouth 3 (three) times daily.   diphenhydrAMINE 25 MG tablet Commonly known as:  BENADRYL Take 25 mg by mouth as needed for itching.   HUMULIN 70/30 KWIKPEN (70-30) 100 UNIT/ML PEN Generic drug:  Insulin Isophane & Regular Human Inject 5-20 Units into the skin 2 (two) times daily as needed (CBG over 175).   hydrALAZINE 25 MG tablet Commonly known as:  APRESOLINE Take 1 tablet (25 mg total) by mouth every 6 (six) hours.   LYRICA 75 MG capsule Generic drug:  pregabalin Take 75 mg by mouth 3 (three) times daily.   metoCLOPramide 5 MG tablet Commonly known as:  REGLAN Take 5 mg by mouth 3 (three) times daily before meals.   pantoprazole 40 MG tablet Commonly known as:  PROTONIX Take 1 tablet (40 mg total) by mouth daily.   sevelamer carbonate 800 MG tablet Commonly known as:  RENVELA Take 2,400 mg by mouth 3 (three) times daily with meals.   ULTICARE INSULIN SYRINGE 29G X 1/2" 0.5 ML Misc Generic drug:  INSULIN SYRINGE .5CC/29G       Follow-up Information    [name], MD. Schedule an appointment as soon as possible for a visit in 1 week(s).   Specialty:  Internal Medicine Contact information: [address] [place] [address] [phone]           Major procedures and Radiology Reports - PLEASE review detailed and final reports thoroughly  -       Ct Head Wo Contrast  Result Date: 09/03/2018 CLINICAL DATA:  Altered mental status EXAM: CT HEAD WITHOUT CONTRAST TECHNIQUE: Contiguous axial images were obtained  from the base of the skull through the vertex without intravenous contrast. COMPARISON:  08/18/2018 head CT FINDINGS: Brain: There is no mass, hemorrhage or extra-axial collection. There is generalized atrophy without lobar predilection. There is no acute or chronic infarction. There is hypoattenuation of the periventricular white matter, most commonly indicating chronic ischemic microangiopathy. Vascular: Atherosclerotic calcification of the internal carotid arteries at the skull base. No abnormal hyperdensity of the major intracranial arteries or dural venous sinuses. Skull: The visualized skull base, calvarium and extracranial soft tissues are normal. Sinuses/Orbits: No fluid levels or advanced mucosal thickening of the visualized paranasal sinuses. No mastoid or middle ear effusion. The orbits are normal. IMPRESSION: Chronic small vessel disease and generalized atrophy without acute intracranial abnormality. Electronically Signed   By: Ulyses Jarred M.D.   On: 09/03/2018 21:49     Ct Angio Chest Pe W Or Wo Contrast  Result Date: 09/03/2018 CLINICAL DATA:  Altered mental status.  Chest pressure. EXAM: CT ANGIOGRAPHY CHEST WITH CONTRAST TECHNIQUE: Multidetector CT imaging of the chest was performed using the standard protocol during bolus administration of intravenous contrast. Multiplanar CT image reconstructions and MIPs were obtained to evaluate the vascular anatomy. CONTRAST:  144m ISOVUE-370 IOPAMIDOL (ISOVUE-370) INJECTION 76% COMPARISON:  None. FINDINGS: Cardiovascular: --Pulmonary arteries: Contrast injection is sufficient to demonstrate satisfactory opacification of the pulmonary arteries to the segmental level. There is no pulmonary embolus. The main pulmonary artery is within normal limits for size. --Aorta: Limited opacification of the aorta due to bolus timing optimization for the pulmonary arteries. Conventional 3 vessel aortic branching pattern. The aortic course and caliber are normal. There  is mild aortic atherosclerosis. --Heart: Mild cardiomegaly. No pericardial effusion. Mediastinum/Nodes: No mediastinal, hilar or axillary lymphadenopathy. The visualized thyroid and thoracic esophageal course are unremarkable. Lungs/Pleura: There  are tree-in-bud opacities within the right upper and lower lobes. Left lung is clear. No pleural effusion or pneumothorax. Upper Abdomen: Contrast bolus timing is not optimized for evaluation of the abdominal organs. Within this limitation, the visualized organs of the upper abdomen are normal. Musculoskeletal: No chest wall abnormality. No acute or significant osseous findings. Review of the MIP images confirms the above findings. IMPRESSION: 1. No pulmonary embolus. 2. Tree-in-bud opacities within the right upper and lower lobes suggest aspiration or endobronchial infection. 3. Mild cardiomegaly and aortic atherosclerosis (ICD10-I70.0). Electronically Signed   By: Ulyses Jarred M.D.   On: 09/03/2018 22:26   Dg Chest Portable 1 View  Result Date: 09/03/2018 CLINICAL DATA:  Altered mental status. Cough and cold symptoms for 2 weeks. EXAM: PORTABLE CHEST 1 VIEW COMPARISON:  08/18/2018 FINDINGS: Normal heart size. No pleural effusion or edema. Aortic atherosclerosis. No airspace opacities. The visualized osseous structures are unremarkable. IMPRESSION: 1. No active disease 2.  Aortic Atherosclerosis (ICD10-I70.0). Electronically Signed   By: Kerby Moors M.D.   On: 09/03/2018 16:31    Micro Results     Recent Results (from the past 240 hour(s))  Respiratory Panel by PCR     Status: Abnormal   Collection Time: 09/03/18  7:56 PM  Result Value Ref Range Status   Adenovirus NOT DETECTED NOT DETECTED Final   Coronavirus 229E NOT DETECTED NOT DETECTED Final   Coronavirus HKU1 NOT DETECTED NOT DETECTED Final   Coronavirus NL63 NOT DETECTED NOT DETECTED Final   Coronavirus OC43 NOT DETECTED NOT DETECTED Final   Metapneumovirus NOT DETECTED NOT DETECTED Final    Rhinovirus / Enterovirus DETECTED (A) NOT DETECTED Final   Influenza A NOT DETECTED NOT DETECTED Final   Influenza B NOT DETECTED NOT DETECTED Final   Parainfluenza Virus 1 NOT DETECTED NOT DETECTED Final   Parainfluenza Virus 2 NOT DETECTED NOT DETECTED Final   Parainfluenza Virus 3 NOT DETECTED NOT DETECTED Final   Parainfluenza Virus 4 NOT DETECTED NOT DETECTED Final   Respiratory Syncytial Virus NOT DETECTED NOT DETECTED Final   Bordetella pertussis NOT DETECTED NOT DETECTED Final   Chlamydophila pneumoniae NOT DETECTED NOT DETECTED Final   Mycoplasma pneumoniae NOT DETECTED NOT DETECTED Final    Comment: Performed at [facility], [place] [address], [place], [place] [address]    Today   Subjective    [facility] today has no headache,no chest abdominal pain,no new weakness tingling or numbness, feels much better wants to go home today.     Objective   Blood pressure 136/61, pulse 80, temperature 98.4 F (36.9 C), temperature source Oral, resp. rate 17, height 5' (1.524 m), weight 49.6 kg, SpO2 91 %.  No intake or output data in the 24 hours ending 09/04/18 0836  Exam  Awake Alert, Oriented x 3, No new F.N deficits, Normal affect [place].AT,PERRAL Supple Neck,No JVD, No cervical lymphadenopathy appriciated.  Symmetrical Chest wall movement, Good air movement bilaterally, CTAB RRR,No Gallops,Rubs or new Murmurs, No Parasternal Heave +ve B.Sounds, Abd Soft, Non tender, No organomegaly appriciated, No rebound -guarding or rigidity. No Cyanosis, Clubbing or edema, No new Rash or bruise, bi- lateral BKA prosthesis by bedside   Data Review   CBC w Diff:  Lab Results  Component Value Date   WBC 7.8 09/04/2018   HGB 13.3 09/04/2018   HCT 43.5 09/04/2018   HCT 29.5 (L) 05/20/2015   PLT 88 (L) 09/04/2018   LYMPHOPCT 9 09/03/2018   MONOPCT 6 09/03/2018  EOSPCT 0 09/03/2018   BASOPCT 0 09/03/2018    CMP:  Lab Results  Component Value Date   NA 134 (L) 09/04/2018   K  3.4 (L) 09/04/2018   CL 96 (L) 09/04/2018   CO2 20 (L) 09/04/2018   BUN 32 (H) 09/04/2018   CREATININE 5.16 (H) 09/04/2018   PROT 7.4 09/04/2018   ALBUMIN 3.2 (L) 09/04/2018   BILITOT 1.0 09/04/2018   ALKPHOS 76 09/04/2018   AST 26 09/04/2018   ALT 18 09/04/2018  .   Total Time in preparing paper work, data evaluation and todays exam - 57 minutes  Lala Lund M.D on 09/04/2018 at 8:36 AM  [facility]   Office  [phone]

## 2018-09-04 NOTE — Care Management Note (Signed)
Case Management Note  Patient Details  Name: Garrett Salas MRN: [id number] Date of Birth: 02-08-1951  Subjective/Objective:                 Consult for COPD   Action/Plan:  No new meds for DC on AVS. Patient from home w wife. Denies barriers obtaining meds, DME, or accessing healthcare. Patient has aid through [facility]. No CM needs identified.  PCP Hale Bogus. Coverage Medicare/ Medicaid  Expected Discharge Date:  09/04/18               Expected Discharge Plan:  Home/Self Care  In-House Referral:     Discharge planning Services  CM Consult  Post Acute Care Choice:    Choice offered to:     DME Arranged:    DME Agency:     HH Arranged:    HH Agency:     Status of Service:  Completed, signed off  If discussed at [employer] of Stay Meetings, dates discussed:    Additional Comments:  Carles Collet, RN 09/04/2018, 10:43 AM

## 2018-09-04 NOTE — Discharge Instructions (Signed)
Wear a facemask when around people for the next 1 week.  Get 3 facemasks form the nurse before you leave the hospital.  Follow with Primary MD Garrett Salas., MD in 7 days   Get CBC, CMP, 2 view Chest X ray -  checked  by Primary MD  5-7 days    Activity: As tolerated with Full fall precautions use walker/cane & assistance as needed  Disposition Home    Diet: Renal diet with 1.5 L/day total fluid restriction.  Special Instructions: If you have smoked or chewed Tobacco  in the last 2 yrs please stop smoking, stop any regular Alcohol  and or any Recreational drug use.  On your next visit with your primary care physician please Get Medicines reviewed and adjusted.  Please request your Prim.MD to go over all Hospital Tests and Procedure/Radiological results at the follow up, please get all Hospital records sent to your Prim MD by signing hospital release before you go home.  If you experience worsening of your admission symptoms, develop shortness of breath, life threatening emergency, suicidal or homicidal thoughts you must seek medical attention immediately by calling 911 or calling your MD immediately  if symptoms less severe.  You Must read complete instructions/literature along with all the possible adverse reactions/side effects for all the Medicines you take and that have been prescribed to you. Take any new Medicines after you have completely understood and accpet all the possible adverse reactions/side effects.

## 2018-09-04 NOTE — Progress Notes (Signed)
Nsg Discharge Note  Admit Date:  09/03/2018 Discharge date: 09/04/2018   [facility] to be D/C'd Home per MD order.  AVS completed.  Copy for chart, and copy for patient signed, and dated. Patient/caregiver able to verbalize understanding.  Discharge Medication: Allergies as of 09/04/2018   No Known Allergies     Medication List    TAKE these medications   ACCU-CHEK AVIVA PLUS test strip Generic drug:  glucose blood   ACCU-CHEK AVIVA PLUS w/Device Kit   ACCU-CHEK SOFTCLIX LANCETS lancets   albuterol 108 (90 Base) MCG/ACT inhaler Commonly known as:  PROVENTIL HFA;VENTOLIN HFA Inhale 2 puffs into the lungs every 6 (six) hours as needed for wheezing or shortness of breath.   amLODipine 10 MG tablet Commonly known as:  NORVASC Take 1 tablet (10 mg total) by mouth daily.   atorvastatin 40 MG tablet Commonly known as:  LIPITOR Take 40 mg by mouth daily.   atorvastatin 20 MG tablet Commonly known as:  LIPITOR Take 1 tablet (20 mg total) by mouth daily at 6 PM.   B-D UF III MINI PEN NEEDLES 31G X 5 MM Misc Generic drug:  Insulin Pen Needle   calcitRIOL 0.5 MCG capsule Commonly known as:  ROCALTROL Take 1 capsule (0.5 mcg total) by mouth Every Tuesday,Thursday,and Saturday with dialysis.   cloNIDine 0.1 MG tablet Commonly known as:  CATAPRES Take 0.1 mg by mouth 3 (three) times daily.   diphenhydrAMINE 25 MG tablet Commonly known as:  BENADRYL Take 25 mg by mouth as needed for itching.   HUMULIN 70/30 KWIKPEN (70-30) 100 UNIT/ML PEN Generic drug:  Insulin Isophane & Regular Human Inject 5-20 Units into the skin 2 (two) times daily as needed (CBG over 175).   hydrALAZINE 25 MG tablet Commonly known as:  APRESOLINE Take 1 tablet (25 mg total) by mouth every 6 (six) hours.   LYRICA 75 MG capsule Generic drug:  pregabalin Take 75 mg by mouth 3 (three) times daily.   metoCLOPramide 5 MG tablet Commonly known as:  REGLAN Take 5 mg by mouth 3 (three) times daily  before meals.   pantoprazole 40 MG tablet Commonly known as:  PROTONIX Take 1 tablet (40 mg total) by mouth daily.   sevelamer carbonate 800 MG tablet Commonly known as:  RENVELA Take 2,400 mg by mouth 3 (three) times daily with meals.   ULTICARE INSULIN SYRINGE 29G X 1/2" 0.5 ML Misc Generic drug:  INSULIN SYRINGE .5CC/29G       Discharge Assessment: Vitals:   09/04/18 0343 09/04/18 0815  BP: 136/61   Pulse:    Resp: 17   Temp: 98.4 F (36.9 C)   SpO2: 98% 91%   Skin clean, dry and intact without evidence of skin break down, no evidence of skin tears noted. IV catheter discontinued intact. Site without signs and symptoms of complications - no redness or edema noted at insertion site, patient denies c/o pain - only slight tenderness at site.  Dressing with slight pressure applied.  D/c Instructions-Education: Discharge instructions given to patient/family with verbalized understanding. D/c education completed with patient/family including follow up instructions, medication list, d/c activities limitations if indicated, with other d/c instructions as indicated by MD - patient able to verbalize understanding, all questions fully answered. Patient instructed to return to ED, call 911, or call MD for any changes in condition.  Patient escorted via [place], and D/C home via private auto.  Hiram Comber, RN 09/04/2018 11:06 AM

## 2018-09-06 DIAGNOSIS — N186 End stage renal disease: Secondary | ICD-10-CM | POA: Diagnosis not present

## 2018-09-06 DIAGNOSIS — N2581 Secondary hyperparathyroidism of renal origin: Secondary | ICD-10-CM | POA: Diagnosis not present

## 2018-09-06 DIAGNOSIS — D509 Iron deficiency anemia, unspecified: Secondary | ICD-10-CM | POA: Diagnosis not present

## 2018-09-06 DIAGNOSIS — E1129 Type 2 diabetes mellitus with other diabetic kidney complication: Secondary | ICD-10-CM | POA: Diagnosis not present

## 2018-09-06 DIAGNOSIS — E162 Hypoglycemia, unspecified: Secondary | ICD-10-CM | POA: Diagnosis not present

## 2018-09-06 LAB — CULTURE, BLOOD (ROUTINE X 2)

## 2018-09-08 DIAGNOSIS — D509 Iron deficiency anemia, unspecified: Secondary | ICD-10-CM | POA: Diagnosis not present

## 2018-09-08 DIAGNOSIS — N2581 Secondary hyperparathyroidism of renal origin: Secondary | ICD-10-CM | POA: Diagnosis not present

## 2018-09-08 DIAGNOSIS — E1129 Type 2 diabetes mellitus with other diabetic kidney complication: Secondary | ICD-10-CM | POA: Diagnosis not present

## 2018-09-08 DIAGNOSIS — N186 End stage renal disease: Secondary | ICD-10-CM | POA: Diagnosis not present

## 2018-09-08 DIAGNOSIS — E162 Hypoglycemia, unspecified: Secondary | ICD-10-CM | POA: Diagnosis not present

## 2018-09-08 LAB — CULTURE, BLOOD (ROUTINE X 2)
Culture: NO GROWTH
Special Requests: ADEQUATE

## 2018-09-09 DIAGNOSIS — N186 End stage renal disease: Secondary | ICD-10-CM | POA: Diagnosis not present

## 2018-09-09 DIAGNOSIS — T82858A Stenosis of vascular prosthetic devices, implants and grafts, initial encounter: Secondary | ICD-10-CM | POA: Diagnosis not present

## 2018-09-10 DIAGNOSIS — N2581 Secondary hyperparathyroidism of renal origin: Secondary | ICD-10-CM | POA: Diagnosis not present

## 2018-09-10 DIAGNOSIS — N186 End stage renal disease: Secondary | ICD-10-CM | POA: Diagnosis not present

## 2018-09-10 DIAGNOSIS — D509 Iron deficiency anemia, unspecified: Secondary | ICD-10-CM | POA: Diagnosis not present

## 2018-09-10 DIAGNOSIS — E1129 Type 2 diabetes mellitus with other diabetic kidney complication: Secondary | ICD-10-CM | POA: Diagnosis not present

## 2018-09-10 DIAGNOSIS — E162 Hypoglycemia, unspecified: Secondary | ICD-10-CM | POA: Diagnosis not present

## 2018-09-13 DIAGNOSIS — E162 Hypoglycemia, unspecified: Secondary | ICD-10-CM | POA: Diagnosis not present

## 2018-09-13 DIAGNOSIS — N186 End stage renal disease: Secondary | ICD-10-CM | POA: Diagnosis not present

## 2018-09-13 DIAGNOSIS — D509 Iron deficiency anemia, unspecified: Secondary | ICD-10-CM | POA: Diagnosis not present

## 2018-09-13 DIAGNOSIS — N2581 Secondary hyperparathyroidism of renal origin: Secondary | ICD-10-CM | POA: Diagnosis not present

## 2018-09-13 DIAGNOSIS — E1129 Type 2 diabetes mellitus with other diabetic kidney complication: Secondary | ICD-10-CM | POA: Diagnosis not present

## 2018-09-15 DIAGNOSIS — E162 Hypoglycemia, unspecified: Secondary | ICD-10-CM | POA: Diagnosis not present

## 2018-09-15 DIAGNOSIS — D509 Iron deficiency anemia, unspecified: Secondary | ICD-10-CM | POA: Diagnosis not present

## 2018-09-15 DIAGNOSIS — N186 End stage renal disease: Secondary | ICD-10-CM | POA: Diagnosis not present

## 2018-09-15 DIAGNOSIS — N2581 Secondary hyperparathyroidism of renal origin: Secondary | ICD-10-CM | POA: Diagnosis not present

## 2018-09-15 DIAGNOSIS — E1129 Type 2 diabetes mellitus with other diabetic kidney complication: Secondary | ICD-10-CM | POA: Diagnosis not present

## 2018-09-16 DIAGNOSIS — N186 End stage renal disease: Secondary | ICD-10-CM | POA: Diagnosis not present

## 2018-09-16 DIAGNOSIS — Z992 Dependence on renal dialysis: Secondary | ICD-10-CM | POA: Diagnosis not present

## 2018-09-16 DIAGNOSIS — E1129 Type 2 diabetes mellitus with other diabetic kidney complication: Secondary | ICD-10-CM | POA: Diagnosis not present

## 2018-09-17 DIAGNOSIS — E1129 Type 2 diabetes mellitus with other diabetic kidney complication: Secondary | ICD-10-CM | POA: Diagnosis not present

## 2018-09-17 DIAGNOSIS — N186 End stage renal disease: Secondary | ICD-10-CM | POA: Diagnosis not present

## 2018-09-17 DIAGNOSIS — N2581 Secondary hyperparathyroidism of renal origin: Secondary | ICD-10-CM | POA: Diagnosis not present

## 2018-09-20 DIAGNOSIS — N2581 Secondary hyperparathyroidism of renal origin: Secondary | ICD-10-CM | POA: Diagnosis not present

## 2018-09-20 DIAGNOSIS — N186 End stage renal disease: Secondary | ICD-10-CM | POA: Diagnosis not present

## 2018-09-20 DIAGNOSIS — E1129 Type 2 diabetes mellitus with other diabetic kidney complication: Secondary | ICD-10-CM | POA: Diagnosis not present

## 2018-09-22 DIAGNOSIS — E1129 Type 2 diabetes mellitus with other diabetic kidney complication: Secondary | ICD-10-CM | POA: Diagnosis not present

## 2018-09-22 DIAGNOSIS — N186 End stage renal disease: Secondary | ICD-10-CM | POA: Diagnosis not present

## 2018-09-22 DIAGNOSIS — N2581 Secondary hyperparathyroidism of renal origin: Secondary | ICD-10-CM | POA: Diagnosis not present

## 2018-09-23 DIAGNOSIS — B348 Other viral infections of unspecified site: Secondary | ICD-10-CM | POA: Diagnosis not present

## 2018-09-23 DIAGNOSIS — R4182 Altered mental status, unspecified: Secondary | ICD-10-CM | POA: Diagnosis not present

## 2018-09-23 DIAGNOSIS — R7881 Bacteremia: Secondary | ICD-10-CM | POA: Diagnosis not present

## 2018-09-24 DIAGNOSIS — N2581 Secondary hyperparathyroidism of renal origin: Secondary | ICD-10-CM | POA: Diagnosis not present

## 2018-09-24 DIAGNOSIS — E1129 Type 2 diabetes mellitus with other diabetic kidney complication: Secondary | ICD-10-CM | POA: Diagnosis not present

## 2018-09-24 DIAGNOSIS — N186 End stage renal disease: Secondary | ICD-10-CM | POA: Diagnosis not present

## 2018-09-27 DIAGNOSIS — N2581 Secondary hyperparathyroidism of renal origin: Secondary | ICD-10-CM | POA: Diagnosis not present

## 2018-09-27 DIAGNOSIS — E1129 Type 2 diabetes mellitus with other diabetic kidney complication: Secondary | ICD-10-CM | POA: Diagnosis not present

## 2018-09-27 DIAGNOSIS — N186 End stage renal disease: Secondary | ICD-10-CM | POA: Diagnosis not present

## 2018-09-29 DIAGNOSIS — E1129 Type 2 diabetes mellitus with other diabetic kidney complication: Secondary | ICD-10-CM | POA: Diagnosis not present

## 2018-09-29 DIAGNOSIS — N186 End stage renal disease: Secondary | ICD-10-CM | POA: Diagnosis not present

## 2018-09-29 DIAGNOSIS — N2581 Secondary hyperparathyroidism of renal origin: Secondary | ICD-10-CM | POA: Diagnosis not present

## 2018-10-01 DIAGNOSIS — N2581 Secondary hyperparathyroidism of renal origin: Secondary | ICD-10-CM | POA: Diagnosis not present

## 2018-10-01 DIAGNOSIS — E1129 Type 2 diabetes mellitus with other diabetic kidney complication: Secondary | ICD-10-CM | POA: Diagnosis not present

## 2018-10-01 DIAGNOSIS — N186 End stage renal disease: Secondary | ICD-10-CM | POA: Diagnosis not present

## 2018-10-04 DIAGNOSIS — N186 End stage renal disease: Secondary | ICD-10-CM | POA: Diagnosis not present

## 2018-10-04 DIAGNOSIS — N2581 Secondary hyperparathyroidism of renal origin: Secondary | ICD-10-CM | POA: Diagnosis not present

## 2018-10-04 DIAGNOSIS — E1129 Type 2 diabetes mellitus with other diabetic kidney complication: Secondary | ICD-10-CM | POA: Diagnosis not present

## 2018-10-06 DIAGNOSIS — N186 End stage renal disease: Secondary | ICD-10-CM | POA: Diagnosis not present

## 2018-10-06 DIAGNOSIS — N2581 Secondary hyperparathyroidism of renal origin: Secondary | ICD-10-CM | POA: Diagnosis not present

## 2018-10-06 DIAGNOSIS — E1129 Type 2 diabetes mellitus with other diabetic kidney complication: Secondary | ICD-10-CM | POA: Diagnosis not present

## 2018-10-08 DIAGNOSIS — N186 End stage renal disease: Secondary | ICD-10-CM | POA: Diagnosis not present

## 2018-10-08 DIAGNOSIS — N2581 Secondary hyperparathyroidism of renal origin: Secondary | ICD-10-CM | POA: Diagnosis not present

## 2018-10-08 DIAGNOSIS — E1129 Type 2 diabetes mellitus with other diabetic kidney complication: Secondary | ICD-10-CM | POA: Diagnosis not present

## 2018-10-11 DIAGNOSIS — E1129 Type 2 diabetes mellitus with other diabetic kidney complication: Secondary | ICD-10-CM | POA: Diagnosis not present

## 2018-10-11 DIAGNOSIS — N2581 Secondary hyperparathyroidism of renal origin: Secondary | ICD-10-CM | POA: Diagnosis not present

## 2018-10-11 DIAGNOSIS — N186 End stage renal disease: Secondary | ICD-10-CM | POA: Diagnosis not present

## 2018-10-13 DIAGNOSIS — E1129 Type 2 diabetes mellitus with other diabetic kidney complication: Secondary | ICD-10-CM | POA: Diagnosis not present

## 2018-10-13 DIAGNOSIS — N2581 Secondary hyperparathyroidism of renal origin: Secondary | ICD-10-CM | POA: Diagnosis not present

## 2018-10-13 DIAGNOSIS — N186 End stage renal disease: Secondary | ICD-10-CM | POA: Diagnosis not present

## 2018-10-15 DIAGNOSIS — N186 End stage renal disease: Secondary | ICD-10-CM | POA: Diagnosis not present

## 2018-10-15 DIAGNOSIS — E1129 Type 2 diabetes mellitus with other diabetic kidney complication: Secondary | ICD-10-CM | POA: Diagnosis not present

## 2018-10-15 DIAGNOSIS — N2581 Secondary hyperparathyroidism of renal origin: Secondary | ICD-10-CM | POA: Diagnosis not present

## 2018-10-16 DIAGNOSIS — N186 End stage renal disease: Secondary | ICD-10-CM | POA: Diagnosis not present

## 2018-10-16 DIAGNOSIS — E1129 Type 2 diabetes mellitus with other diabetic kidney complication: Secondary | ICD-10-CM | POA: Diagnosis not present

## 2018-10-16 DIAGNOSIS — Z992 Dependence on renal dialysis: Secondary | ICD-10-CM | POA: Diagnosis not present

## 2018-10-18 DIAGNOSIS — D631 Anemia in chronic kidney disease: Secondary | ICD-10-CM | POA: Diagnosis not present

## 2018-10-18 DIAGNOSIS — E1129 Type 2 diabetes mellitus with other diabetic kidney complication: Secondary | ICD-10-CM | POA: Diagnosis not present

## 2018-10-18 DIAGNOSIS — N186 End stage renal disease: Secondary | ICD-10-CM | POA: Diagnosis not present

## 2018-10-18 DIAGNOSIS — N2581 Secondary hyperparathyroidism of renal origin: Secondary | ICD-10-CM | POA: Diagnosis not present

## 2018-10-20 DIAGNOSIS — N186 End stage renal disease: Secondary | ICD-10-CM | POA: Diagnosis not present

## 2018-10-20 DIAGNOSIS — E1129 Type 2 diabetes mellitus with other diabetic kidney complication: Secondary | ICD-10-CM | POA: Diagnosis not present

## 2018-10-20 DIAGNOSIS — D631 Anemia in chronic kidney disease: Secondary | ICD-10-CM | POA: Diagnosis not present

## 2018-10-20 DIAGNOSIS — N2581 Secondary hyperparathyroidism of renal origin: Secondary | ICD-10-CM | POA: Diagnosis not present

## 2018-10-22 DIAGNOSIS — E1129 Type 2 diabetes mellitus with other diabetic kidney complication: Secondary | ICD-10-CM | POA: Diagnosis not present

## 2018-10-22 DIAGNOSIS — N186 End stage renal disease: Secondary | ICD-10-CM | POA: Diagnosis not present

## 2018-10-22 DIAGNOSIS — D631 Anemia in chronic kidney disease: Secondary | ICD-10-CM | POA: Diagnosis not present

## 2018-10-22 DIAGNOSIS — N2581 Secondary hyperparathyroidism of renal origin: Secondary | ICD-10-CM | POA: Diagnosis not present

## 2018-10-25 DIAGNOSIS — E1129 Type 2 diabetes mellitus with other diabetic kidney complication: Secondary | ICD-10-CM | POA: Diagnosis not present

## 2018-10-25 DIAGNOSIS — D631 Anemia in chronic kidney disease: Secondary | ICD-10-CM | POA: Diagnosis not present

## 2018-10-25 DIAGNOSIS — N2581 Secondary hyperparathyroidism of renal origin: Secondary | ICD-10-CM | POA: Diagnosis not present

## 2018-10-25 DIAGNOSIS — N186 End stage renal disease: Secondary | ICD-10-CM | POA: Diagnosis not present

## 2018-10-27 DIAGNOSIS — D631 Anemia in chronic kidney disease: Secondary | ICD-10-CM | POA: Diagnosis not present

## 2018-10-27 DIAGNOSIS — E1129 Type 2 diabetes mellitus with other diabetic kidney complication: Secondary | ICD-10-CM | POA: Diagnosis not present

## 2018-10-27 DIAGNOSIS — N186 End stage renal disease: Secondary | ICD-10-CM | POA: Diagnosis not present

## 2018-10-27 DIAGNOSIS — N2581 Secondary hyperparathyroidism of renal origin: Secondary | ICD-10-CM | POA: Diagnosis not present

## 2018-10-29 DIAGNOSIS — D631 Anemia in chronic kidney disease: Secondary | ICD-10-CM | POA: Diagnosis not present

## 2018-10-29 DIAGNOSIS — N186 End stage renal disease: Secondary | ICD-10-CM | POA: Diagnosis not present

## 2018-10-29 DIAGNOSIS — N2581 Secondary hyperparathyroidism of renal origin: Secondary | ICD-10-CM | POA: Diagnosis not present

## 2018-10-29 DIAGNOSIS — E1129 Type 2 diabetes mellitus with other diabetic kidney complication: Secondary | ICD-10-CM | POA: Diagnosis not present

## 2018-11-01 DIAGNOSIS — D631 Anemia in chronic kidney disease: Secondary | ICD-10-CM | POA: Diagnosis not present

## 2018-11-01 DIAGNOSIS — N2581 Secondary hyperparathyroidism of renal origin: Secondary | ICD-10-CM | POA: Diagnosis not present

## 2018-11-01 DIAGNOSIS — E1129 Type 2 diabetes mellitus with other diabetic kidney complication: Secondary | ICD-10-CM | POA: Diagnosis not present

## 2018-11-01 DIAGNOSIS — N186 End stage renal disease: Secondary | ICD-10-CM | POA: Diagnosis not present

## 2018-11-03 DIAGNOSIS — D631 Anemia in chronic kidney disease: Secondary | ICD-10-CM | POA: Diagnosis not present

## 2018-11-03 DIAGNOSIS — E1129 Type 2 diabetes mellitus with other diabetic kidney complication: Secondary | ICD-10-CM | POA: Diagnosis not present

## 2018-11-03 DIAGNOSIS — N186 End stage renal disease: Secondary | ICD-10-CM | POA: Diagnosis not present

## 2018-11-03 DIAGNOSIS — N2581 Secondary hyperparathyroidism of renal origin: Secondary | ICD-10-CM | POA: Diagnosis not present

## 2018-11-05 DIAGNOSIS — N186 End stage renal disease: Secondary | ICD-10-CM | POA: Diagnosis not present

## 2018-11-05 DIAGNOSIS — N2581 Secondary hyperparathyroidism of renal origin: Secondary | ICD-10-CM | POA: Diagnosis not present

## 2018-11-05 DIAGNOSIS — D631 Anemia in chronic kidney disease: Secondary | ICD-10-CM | POA: Diagnosis not present

## 2018-11-05 DIAGNOSIS — E1129 Type 2 diabetes mellitus with other diabetic kidney complication: Secondary | ICD-10-CM | POA: Diagnosis not present

## 2018-11-07 DIAGNOSIS — N186 End stage renal disease: Secondary | ICD-10-CM | POA: Diagnosis not present

## 2018-11-07 DIAGNOSIS — E1129 Type 2 diabetes mellitus with other diabetic kidney complication: Secondary | ICD-10-CM | POA: Diagnosis not present

## 2018-11-07 DIAGNOSIS — N2581 Secondary hyperparathyroidism of renal origin: Secondary | ICD-10-CM | POA: Diagnosis not present

## 2018-11-07 DIAGNOSIS — D631 Anemia in chronic kidney disease: Secondary | ICD-10-CM | POA: Diagnosis not present

## 2018-11-10 DIAGNOSIS — N186 End stage renal disease: Secondary | ICD-10-CM | POA: Diagnosis not present

## 2018-11-10 DIAGNOSIS — D631 Anemia in chronic kidney disease: Secondary | ICD-10-CM | POA: Diagnosis not present

## 2018-11-10 DIAGNOSIS — E1129 Type 2 diabetes mellitus with other diabetic kidney complication: Secondary | ICD-10-CM | POA: Diagnosis not present

## 2018-11-10 DIAGNOSIS — N2581 Secondary hyperparathyroidism of renal origin: Secondary | ICD-10-CM | POA: Diagnosis not present

## 2018-11-12 DIAGNOSIS — N186 End stage renal disease: Secondary | ICD-10-CM | POA: Diagnosis not present

## 2018-11-12 DIAGNOSIS — E1129 Type 2 diabetes mellitus with other diabetic kidney complication: Secondary | ICD-10-CM | POA: Diagnosis not present

## 2018-11-12 DIAGNOSIS — N2581 Secondary hyperparathyroidism of renal origin: Secondary | ICD-10-CM | POA: Diagnosis not present

## 2018-11-12 DIAGNOSIS — D631 Anemia in chronic kidney disease: Secondary | ICD-10-CM | POA: Diagnosis not present

## 2018-11-14 DIAGNOSIS — E1129 Type 2 diabetes mellitus with other diabetic kidney complication: Secondary | ICD-10-CM | POA: Diagnosis not present

## 2018-11-14 DIAGNOSIS — N2581 Secondary hyperparathyroidism of renal origin: Secondary | ICD-10-CM | POA: Diagnosis not present

## 2018-11-14 DIAGNOSIS — D631 Anemia in chronic kidney disease: Secondary | ICD-10-CM | POA: Diagnosis not present

## 2018-11-14 DIAGNOSIS — N186 End stage renal disease: Secondary | ICD-10-CM | POA: Diagnosis not present

## 2019-02-17 ENCOUNTER — Other Ambulatory Visit: Payer: Self-pay

## 2019-02-17 DIAGNOSIS — I70261 Atherosclerosis of native arteries of extremities with gangrene, right leg: Secondary | ICD-10-CM

## 2019-02-21 ENCOUNTER — Ambulatory Visit (HOSPITAL_COMMUNITY)
Admission: RE | Admit: 2019-02-21 | Discharge: 2019-02-21 | Disposition: A | Discharge status: Home / Self Care | Payer: Medicare Other | Source: Ambulatory Visit | Attending: Vascular Surgery | Admitting: Vascular Surgery

## 2019-02-21 ENCOUNTER — Ambulatory Visit (INDEPENDENT_AMBULATORY_CARE_PROVIDER_SITE_OTHER): Payer: Medicare Other | Admitting: Vascular Surgery

## 2019-02-21 ENCOUNTER — Other Ambulatory Visit: Payer: Self-pay

## 2019-02-21 ENCOUNTER — Encounter: Payer: Self-pay | Admitting: Vascular Surgery

## 2019-02-21 VITALS — BP 149/72 | HR 64 | Temp 99.1°F | Resp 18 | Ht 60.0 in | Wt 109.0 lb

## 2019-02-21 DIAGNOSIS — L98491 Non-pressure chronic ulcer of skin of other sites limited to breakdown of skin: Secondary | ICD-10-CM | POA: Diagnosis not present

## 2019-02-21 DIAGNOSIS — I70261 Atherosclerosis of native arteries of extremities with gangrene, right leg: Secondary | ICD-10-CM | POA: Insufficient documentation

## 2019-02-21 NOTE — Progress Notes (Signed)
[facility]  Patient name: Garrett Salas MRN: [id number] DOB: [birth year] Sex: male  REASON FOR CONSULT: Evaluation of ischemic ulcer right second finger  HPI: Garrett Salas is a 68 y.o. male, who is here today for evaluation.  He has a long history of end-stage renal disease.  He has a functioning right upper arm AV fistula which is been present for many years.  He had had several prior failed left arm accesses.  He has a history of bilateral below-knee amputations due to peripheral vascular disease.  Over the last 6 months he is reported a superficial ulceration over the medial aspect of his mid second finger on the right.  He reports that this is had some peeling of skin and he does have some chronic pain associated with this.  Past Medical History:  Diagnosis Date   Diabetes mellitus    Type 2. Diagnosed in mid 1s. Currently insulin requiring   Diabetic neuropathy ([place])    Diabetic retinopathy    legally blind   Dialysis patient ([place])    ESRD (end stage renal disease) on dialysis ([place])    due to diabetic nephropathy. T/Th/Sat   Hx of right BKA ([facility])    Hyperlipidemia    Hypertension    Peripheral vascular disease ([place])    Tobacco abuse     Family History  Problem Relation Age of Onset   Diabetes Mother    Heart disease Other    Alcohol abuse Other    Diabetes Sister    Diabetes Brother     SOCIAL HISTORY: Social History   Socioeconomic History   Marital status: Married    Spouse name: Not on file   Number of children: 1   Years of education: Not on file   Highest education level: Not on file  Occupational History   Occupation: retired  Scientist, product/process development strain: Not on file   Food insecurity:    Worry: Not on file    Inability: Not on Lexicographer needs:    Medical: Not on file    Non-medical: Not on file  Tobacco Use   Smoking status: Light  Tobacco Smoker    Packs/day: 1.00    Years: 30.00    Pack years: 30.00    Types: Cigarettes   Smokeless tobacco: Never Used  Substance and Sexual Activity   Alcohol use: No    Alcohol/week: 0.0 standard drinks   Drug use: Yes    Types: Marijuana   Sexual activity: Not on file  Lifestyle   Physical activity:    Days per week: Not on file    Minutes per session: Not on file   Stress: Not on file  Relationships   Social connections:    Talks on phone: Not on file    Gets together: Not on file    Attends religious service: Not on file    Active member of club or organization: Not on file    Attends meetings of clubs or organizations: Not on file    Relationship status: Not on file   Intimate partner violence:    Fear of current or ex partner: Not on file    Emotionally abused: Not on file    Physically abused: Not on file    Forced sexual activity: Not on file  Other Topics Concern   Not on file  Social History Narrative   Not on file    No Known  Allergies  Current Outpatient Medications  Medication Sig Dispense Refill   ACCU-CHEK AVIVA PLUS test strip      ACCU-CHEK SOFTCLIX LANCETS lancets      albuterol (PROVENTIL HFA;VENTOLIN HFA) 108 (90 BASE) MCG/ACT inhaler Inhale 2 puffs into the lungs every 6 (six) hours as needed for wheezing or shortness of breath.     amLODipine (NORVASC) 10 MG tablet Take 1 tablet (10 mg total) by mouth daily. 30 tablet 0   atorvastatin (LIPITOR) 40 MG tablet Take 40 mg by mouth daily.     AURYXIA 1 GM 210 MG(Fe) tablet TAKE 2 TABLETS BY MOUTH THREE TIMES A DAY WITH MEALS.   SWALLOW WHOLE, DO NOT CHEW OR CRUSH MEDICATION     B-D UF III MINI PEN NEEDLES 31G X 5 MM MISC      Blood Glucose Monitoring Suppl (ACCU-CHEK AVIVA PLUS) W/DEVICE KIT      calcitRIOL (ROCALTROL) 0.5 MCG capsule Take 1 capsule (0.5 mcg total) by mouth Every Tuesday,Thursday,and Saturday with dialysis. 30 capsule    cloNIDine (CATAPRES) 0.1 MG tablet  Take 0.1 mg by mouth 3 (three) times daily.     diphenhydrAMINE (BENADRYL) 25 MG tablet Take 25 mg by mouth as needed for itching.     HUMULIN 70/30 KWIKPEN (70-30) 100 UNIT/ML PEN Inject 5-20 Units into the skin 2 (two) times daily as needed (CBG over 175).   5   hydrALAZINE (APRESOLINE) 25 MG tablet Take 1 tablet (25 mg total) by mouth every 6 (six) hours.     LYRICA 75 MG capsule Take 75 mg by mouth 3 (three) times daily.      metoCLOPramide (REGLAN) 5 MG tablet Take 5 mg by mouth 3 (three) times daily before meals.      pantoprazole (PROTONIX) 40 MG tablet Take 1 tablet (40 mg total) by mouth daily.     sevelamer (RENVELA) 800 MG tablet Take 2,400 mg by mouth 3 (three) times daily with meals.      ULTICARE INSULIN SYRINGE 29G X 1/2" 0.5 ML MISC      No current facility-administered medications for this visit.     REVIEW OF SYSTEMS:  _0  denotes positive finding, _1  denotes negative finding Cardiac  Comments:  Chest pain or chest pressure:    Shortness of breath upon exertion:    Short of breath when lying flat:    Irregular heart rhythm:        Vascular    Pain in calf, thigh, or hip brought on by ambulation:    Pain in feet at night that wakes you up from your sleep:     Blood clot in your veins:    Leg swelling:         Pulmonary    Oxygen at home:    Productive cough:     Wheezing:         Neurologic    Sudden weakness in arms or legs:     Sudden numbness in arms or legs:     Sudden onset of difficulty speaking or slurred speech:    Temporary loss of vision in one eye:     Problems with dizziness:         Gastrointestinal    Blood in stool:     Vomited blood:         Genitourinary    Burning when urinating:     Blood in urine:        Psychiatric    Major  depression:         Hematologic    Bleeding problems:    Problems with blood clotting too easily:        Skin    Rashes or ulcers:        Constitutional    Fever or chills:      PHYSICAL  EXAM: Vitals:   02/21/19 1021  BP: (!) 149/72  Pulse: 64  Resp: 18  Temp: 99.1 F (37.3 C)  SpO2: 95%  Weight: 109 lb (49.4 kg)  Height: 5' (1.524 m)    GENERAL: The patient is a well-nourished male, in no acute distress. The vital signs are documented above. CARDIOVASCULAR: His radial pulses bilaterally are absent.  He has extremely calcified vessels which are palpable at the radial level bilaterally.  He does not have ulnar pulses. PULMONARY: There is good air exchange  ABDOMEN: Soft and non-tender  MUSCULOSKELETAL: There are no major deformities or cyanosis.  Bilateral below-knee amputations NEUROLOGIC: No focal weakness or paresthesias are detected. SKIN: There are no ulcers or rashes noted.  1 cm ulceration over the medial aspect of his right second finger PSYCHIATRIC: The patient has a normal affect.  DATA:  Noninvasive studies reveals augmentation of flow with compression of his right arm fistula.  On listening with Doppler, he does have increased flow in the right radial artery with compression of the right upper arm fistula  MEDICAL ISSUES: Patient has extremely severe peripheral vascular occlusive disease with bilateral lower extremity amputations and ischemia to both hands.  I did explain that his chronic fistula may be adding to his slow healing of the ulceration of his finger.  I did explain the option of ligation of the fistula but then would be required to depend on a catheter or attempt a leg graft and a leg with a below-knee amputation.  Patient is quite resistant to this option.  Also explained that we could proceed with fistulogram to determine if there are any options for improvement of flow but feel this is highly unlikely.  He is quite insistent on observation only.  Will let us know if he has any progression in pain or tissue loss.   Rosetta Posner, MD FACS [facility] Tel [phone] Pager [phone]

## 2019-05-15 ENCOUNTER — Encounter: Payer: Self-pay | Admitting: *Deleted

## 2019-05-15 NOTE — Progress Notes (Signed)
Schedule fistulogram of right Arteriovenous fistula to evaluate blood flow to right hand /right second finger per Dr. Luther Parody 02/21/2019 office note. Per Dr. Donzetta Matters.

## 2019-05-15 NOTE — Progress Notes (Signed)
Spoke with JADA at [facility]. Patient will schedule fistulogram for 05/22/2019. All pre-procedure instructions(see letter) as well as insulin adjustment guidelines faxed to [facility] to be reviewed with patient.

## 2019-05-17 ENCOUNTER — Other Ambulatory Visit (HOSPITAL_COMMUNITY): Admission: RE | Admit: 2019-05-17 | Payer: Medicare Other | Source: Ambulatory Visit

## 2019-05-17 NOTE — Progress Notes (Signed)
I spoke with Mr Garrett Salas this afternoon to arrange for him to arrive for Covid 19 testing at [facility] drive thru prior to his procedure at [facility] on 05/22/19. He very angrily stated "I already told ya'll I'm not going to [facility]. I am only going to [facility] at 09:45 on Monday. I am not going anywhere else. I've already made arrangements and stop telling me to go to [facility]. My wife is driving me and we don't have enough gas. Ya'll need to stop calling me"  I told him I understood his transportation issues and told him he could be tested DOS and to have a nice weekend. He then hun up on me.

## 2019-05-22 ENCOUNTER — Ambulatory Visit (HOSPITAL_COMMUNITY)
Admission: RE | Admit: 2019-05-22 | Discharge: 2019-05-22 | Disposition: A | Discharge status: Home / Self Care | Payer: Medicare Other | Attending: Vascular Surgery | Admitting: Vascular Surgery

## 2019-05-22 ENCOUNTER — Other Ambulatory Visit: Payer: Self-pay

## 2019-05-22 ENCOUNTER — Encounter (HOSPITAL_COMMUNITY)
Admission: RE | Disposition: A | Discharge status: Home / Self Care | Payer: Self-pay | Source: Home / Self Care | Attending: Vascular Surgery

## 2019-05-22 ENCOUNTER — Other Ambulatory Visit (HOSPITAL_COMMUNITY): Payer: Medicare Other

## 2019-05-22 DIAGNOSIS — Z992 Dependence on renal dialysis: Secondary | ICD-10-CM | POA: Diagnosis not present

## 2019-05-22 DIAGNOSIS — E1122 Type 2 diabetes mellitus with diabetic chronic kidney disease: Secondary | ICD-10-CM | POA: Insufficient documentation

## 2019-05-22 DIAGNOSIS — E11621 Type 2 diabetes mellitus with foot ulcer: Secondary | ICD-10-CM | POA: Diagnosis not present

## 2019-05-22 DIAGNOSIS — Z794 Long term (current) use of insulin: Secondary | ICD-10-CM | POA: Diagnosis not present

## 2019-05-22 DIAGNOSIS — Z89511 Acquired absence of right leg below knee: Secondary | ICD-10-CM | POA: Diagnosis not present

## 2019-05-22 DIAGNOSIS — Y832 Surgical operation with anastomosis, bypass or graft as the cause of abnormal reaction of the patient, or of later complication, without mention of misadventure at the time of the procedure: Secondary | ICD-10-CM | POA: Diagnosis not present

## 2019-05-22 DIAGNOSIS — I120 Hypertensive chronic kidney disease with stage 5 chronic kidney disease or end stage renal disease: Secondary | ICD-10-CM | POA: Insufficient documentation

## 2019-05-22 DIAGNOSIS — E785 Hyperlipidemia, unspecified: Secondary | ICD-10-CM | POA: Diagnosis not present

## 2019-05-22 DIAGNOSIS — L98499 Non-pressure chronic ulcer of skin of other sites with unspecified severity: Secondary | ICD-10-CM | POA: Diagnosis not present

## 2019-05-22 DIAGNOSIS — E11319 Type 2 diabetes mellitus with unspecified diabetic retinopathy without macular edema: Secondary | ICD-10-CM | POA: Insufficient documentation

## 2019-05-22 DIAGNOSIS — F17210 Nicotine dependence, cigarettes, uncomplicated: Secondary | ICD-10-CM | POA: Diagnosis not present

## 2019-05-22 DIAGNOSIS — T82858A Stenosis of vascular prosthetic devices, implants and grafts, initial encounter: Secondary | ICD-10-CM | POA: Insufficient documentation

## 2019-05-22 DIAGNOSIS — N186 End stage renal disease: Secondary | ICD-10-CM

## 2019-05-22 DIAGNOSIS — I7025 Atherosclerosis of native arteries of other extremities with ulceration: Secondary | ICD-10-CM | POA: Diagnosis not present

## 2019-05-22 DIAGNOSIS — T82898A Other specified complication of vascular prosthetic devices, implants and grafts, initial encounter: Secondary | ICD-10-CM | POA: Diagnosis not present

## 2019-05-22 DIAGNOSIS — Z89512 Acquired absence of left leg below knee: Secondary | ICD-10-CM | POA: Diagnosis not present

## 2019-05-22 DIAGNOSIS — Z1159 Encounter for screening for other viral diseases: Secondary | ICD-10-CM | POA: Insufficient documentation

## 2019-05-22 DIAGNOSIS — E1151 Type 2 diabetes mellitus with diabetic peripheral angiopathy without gangrene: Secondary | ICD-10-CM | POA: Diagnosis not present

## 2019-05-22 DIAGNOSIS — E1140 Type 2 diabetes mellitus with diabetic neuropathy, unspecified: Secondary | ICD-10-CM | POA: Insufficient documentation

## 2019-05-22 DIAGNOSIS — Z833 Family history of diabetes mellitus: Secondary | ICD-10-CM | POA: Insufficient documentation

## 2019-05-22 DIAGNOSIS — Z8249 Family history of ischemic heart disease and other diseases of the circulatory system: Secondary | ICD-10-CM | POA: Diagnosis not present

## 2019-05-22 DIAGNOSIS — Z79899 Other long term (current) drug therapy: Secondary | ICD-10-CM | POA: Diagnosis not present

## 2019-05-22 HISTORY — PX: A/V FISTULAGRAM: CATH118298

## 2019-05-22 HISTORY — PX: UPPER EXTREMITY ANGIOGRAPHY: CATH118270

## 2019-05-22 HISTORY — PX: UPPER EXTREMITY VENOGRAPHY: CATH118272

## 2019-05-22 LAB — SARS CORONAVIRUS 2 BY RT PCR (HOSPITAL ORDER, PERFORMED IN ~~LOC~~ HOSPITAL LAB): SARS Coronavirus 2: NEGATIVE

## 2019-05-22 LAB — GLUCOSE, CAPILLARY
Glucose-Capillary: 183 mg/dL — ABNORMAL HIGH (ref 70–99)
Glucose-Capillary: 89 mg/dL (ref 70–99)
Glucose-Capillary: 89 mg/dL (ref 70–99)

## 2019-05-22 SURGERY — A/V FISTULAGRAM
Anesthesia: LOCAL | Laterality: Right

## 2019-05-22 MED ORDER — IODIXANOL 320 MG/ML IV SOLN
INTRAVENOUS | Status: DC | PRN
  Administered 2019-05-22: 120 mL via INTRAVENOUS

## 2019-05-22 MED ORDER — SODIUM CHLORIDE 0.9% FLUSH: 3.0000 mL | INTRAVENOUS | Status: DC | PRN

## 2019-05-22 MED ORDER — LIDOCAINE HCL (PF) 1 % IJ SOLN
INTRAMUSCULAR | Status: AC
  Filled 2019-05-22: qty 30

## 2019-05-22 MED ORDER — HYDRALAZINE HCL 20 MG/ML IJ SOLN
INTRAMUSCULAR | Status: AC
  Filled 2019-05-22: qty 1

## 2019-05-22 MED ORDER — LIDOCAINE HCL (PF) 1 % IJ SOLN
INTRAMUSCULAR | Status: DC | PRN
  Administered 2019-05-22: 5 mL

## 2019-05-22 MED ORDER — HEPARIN (PORCINE) IN NACL 1000-0.9 UT/500ML-% IV SOLN
INTRAVENOUS | Status: DC | PRN
  Administered 2019-05-22: 500 mL

## 2019-05-22 MED ORDER — HYDRALAZINE HCL 20 MG/ML IJ SOLN
INTRAMUSCULAR | Status: DC | PRN
  Administered 2019-05-22: 20 mg via INTRAVENOUS

## 2019-05-22 MED ORDER — HEPARIN (PORCINE) IN NACL 1000-0.9 UT/500ML-% IV SOLN
INTRAVENOUS | Status: AC
  Filled 2019-05-22: qty 500

## 2019-05-22 SURGICAL SUPPLY — 14 items
BAG SNAP BAND KOVER 36X36 (MISCELLANEOUS) ×4 IMPLANT
CATH ANGIO 5F BER2 65CM (CATHETERS) ×4 IMPLANT
COVER DOME SNAP 22 D (MISCELLANEOUS) ×4 IMPLANT
DEVICE TORQUE .025-.038 (MISCELLANEOUS) ×4 IMPLANT
GUIDEWIRE ANGLED .035X150CM (WIRE) ×4 IMPLANT
KIT MICROPUNCTURE NIT STIFF (SHEATH) ×4 IMPLANT
PROTECTION STATION PRESSURIZED (MISCELLANEOUS) ×4
SHEATH PINNACLE 5F 10CM (SHEATH) ×4 IMPLANT
SHEATH PROBE COVER 6X72 (BAG) ×8 IMPLANT
STATION PROTECTION PRESSURIZED (MISCELLANEOUS) ×3 IMPLANT
STOPCOCK MORSE 400PSI 3WAY (MISCELLANEOUS) ×4 IMPLANT
TRAY PV CATH (CUSTOM PROCEDURE TRAY) ×4 IMPLANT
TUBING CIL FLEX 10 FLL-RA (TUBING) ×8 IMPLANT
WIRE TORQFLEX AUST .018X40CM (WIRE) ×8 IMPLANT

## 2019-05-22 NOTE — H&P (Signed)
REASON FOR CONSULT: Evaluation of ischemic ulcer right second finger  HPI:  Garrett Salas is a 68 y.o. male, who is here today for evaluation.  He has a long history of end-stage renal disease.  He has a functioning right upper arm AV fistula which is been present for many years. He had had several prior failed left arm accesses.  He has a history of bilateral below-knee amputations due to peripheral vascular disease. Over the last 6 months he is reported a superficial ulceration over the medial aspect of his mid second finger on the right.  He reports that this is had some peeling of skin and he does have some chronic pain associated with this.      Past Medical History:  Diagnosis Date  . Diabetes mellitus    Type 2. Diagnosed in mid 85s. Currently insulin requiring  . Diabetic neuropathy ([place])   . Diabetic retinopathy    legally blind  . Dialysis patient ([place])   . ESRD (end stage renal disease) on dialysis ([place])    due to diabetic nephropathy. T/Th/Sat  . Hx of right BKA ([place])   . Hyperlipidemia   . Hypertension   . Peripheral vascular disease ([place])   . Tobacco abuse          Family History  Problem Relation Age of Onset  . Diabetes Mother   . Heart disease Other   . Alcohol abuse Other   . Diabetes Sister   . Diabetes Brother     SOCIAL HISTORY: Social History        Socioeconomic History  . Marital status: Married    Spouse name: Not on file  . Number of children: 1  . Years of education: Not on file  . Highest education level: Not on file  Occupational History  . Occupation: retired  Scientific laboratory technician  . Financial resource strain: Not on file  . Food insecurity:    Worry: Not on file    Inability: Not on file  . Transportation needs:    Medical: Not on file    Non-medical: Not on file  Tobacco Use  . Smoking status: Light Tobacco Smoker    Packs/day: 1.00    Years: 30.00    Pack years: 30.00    Types:  Cigarettes  . Smokeless tobacco: Never Used  Substance and Sexual Activity  . Alcohol use: No    Alcohol/week: 0.0 standard drinks  . Drug use: Yes    Types: Marijuana  . Sexual activity: Not on file  Lifestyle  . Physical activity:    Days per week: Not on file    Minutes per session: Not on file  . Stress: Not on file  Relationships  . Social connections:    Talks on phone: Not on file    Gets together: Not on file    Attends religious service: Not on file    Active member of club or organization: Not on file    Attends meetings of clubs or organizations: Not on file    Relationship status: Not on file  . Intimate partner violence:    Fear of current or ex partner: Not on file    Emotionally abused: Not on file    Physically abused: Not on file    Forced sexual activity: Not on file  Other Topics Concern  . Not on file  Social History Narrative  . Not on file    No Known Allergies  Current Outpatient Medications  Medication Sig Dispense Refill  . ACCU-CHEK AVIVA PLUS test strip     . ACCU-CHEK SOFTCLIX LANCETS lancets     . albuterol (PROVENTIL HFA;VENTOLIN HFA) 108 (90 BASE) MCG/ACT inhaler Inhale 2 puffs into the lungs every 6 (six) hours as needed for wheezing or shortness of breath.    Marland Kitchen amLODipine (NORVASC) 10 MG tablet Take 1 tablet (10 mg total) by mouth daily. 30 tablet 0  . atorvastatin (LIPITOR) 40 MG tablet Take 40 mg by mouth daily.    Lorin Picket 1 GM 210 MG(Fe) tablet TAKE 2 TABLETS BY MOUTH THREE TIMES A DAY WITH MEALS.   SWALLOW WHOLE, DO NOT CHEW OR CRUSH MEDICATION    . B-D UF III MINI PEN NEEDLES 31G X 5 MM MISC     . Blood Glucose Monitoring Suppl (ACCU-CHEK AVIVA PLUS) W/DEVICE KIT     . calcitRIOL (ROCALTROL) 0.5 MCG capsule Take 1 capsule (0.5 mcg total) by mouth Every Tuesday,Thursday,and Saturday with dialysis. 30 capsule   . cloNIDine (CATAPRES) 0.1 MG tablet Take 0.1 mg by mouth 3 (three)  times daily.    . diphenhydrAMINE (BENADRYL) 25 MG tablet Take 25 mg by mouth as needed for itching.    Marland Kitchen HUMULIN 70/30 KWIKPEN (70-30) 100 UNIT/ML PEN Inject 5-20 Units into the skin 2 (two) times daily as needed (CBG over 175).   5  . hydrALAZINE (APRESOLINE) 25 MG tablet Take 1 tablet (25 mg total) by mouth every 6 (six) hours.    Marland Kitchen LYRICA 75 MG capsule Take 75 mg by mouth 3 (three) times daily.     . metoCLOPramide (REGLAN) 5 MG tablet Take 5 mg by mouth 3 (three) times daily before meals.     . pantoprazole (PROTONIX) 40 MG tablet Take 1 tablet (40 mg total) by mouth daily.    . sevelamer (RENVELA) 800 MG tablet Take 2,400 mg by mouth 3 (three) times daily with meals.     Marland Kitchen ULTICARE INSULIN SYRINGE 29G X 1/2" 0.5 ML MISC      No current facility-administered medications for this visit.     REVIEW OF SYSTEMS:  [X]  denotes positive finding, [ ]  denotes negative finding Cardiac  Comments:  Chest pain or chest pressure:    Shortness of breath upon exertion:    Short of breath when lying flat:    Irregular heart rhythm:        Vascular    Pain in calf, thigh, or hip brought on by ambulation:    Pain in feet at night that wakes you up from your sleep:     Blood clot in your veins:    Leg swelling:         Pulmonary    Oxygen at home:    Productive cough:     Wheezing:         Neurologic    Sudden weakness in arms or legs:     Sudden numbness in arms or legs:     Sudden onset of difficulty speaking or slurred speech:    Temporary loss of vision in one eye:     Problems with dizziness:         Gastrointestinal    Blood in stool:     Vomited blood:         Genitourinary    Burning when urinating:     Blood in urine:        Psychiatric    Major depression:  Hematologic    Bleeding problems:    Problems with blood clotting too easily:        Skin    Rashes  or ulcers:        Constitutional    Fever or chills:      PHYSICAL EXAM: There were no vitals filed for this visit.  GENERAL: The patient is a well-nourished male, in no acute distress. The vital signs are documented above. CARDIOVASCULAR: His radial pulses bilaterally are absent.  He has extremely calcified vessels which are palpable at the radial level bilaterally.  He does not have ulnar pulses. PULMONARY: There is good air exchange  ABDOMEN: Soft and non-tender  MUSCULOSKELETAL: There are no major deformities or cyanosis.  Bilateral below-knee amputations NEUROLOGIC: No focal weakness or paresthesias are detected. SKIN: There are no ulcers or rashes noted.  1 cm ulceration over the medial aspect of his right second finger PSYCHIATRIC: The patient has a normal affect.  DATA:  Noninvasive studies reveals augmentation of flow with compression of his right arm fistula.    A/P: Patient has extremely severe peripheral vascular occlusive disease with bilateral lower extremity amputations and ischemia to both hands.  I did explain that his chronic fistula may be adding to his slow healing of the ulceration of his finger.  I did explain the option of ligation of the fistula but then would be required to depend on a catheter or attempt a leg graft and a leg with a below-knee amputation.  Patient is quite resistant to this option.  Also explained that we could proceed with fistulogram to determine if there are any options for improvement of flow but feel this is highly unlikely.  He is quite insistent on observation only.  Will let us know if he has any progression in pain or tissue loss.   Anayely Constantine C. Donzetta Matters, MD [facility] [place] Office: [phone] Pager: [phone]

## 2019-05-22 NOTE — Discharge Instructions (Signed)

## 2019-05-22 NOTE — Op Note (Signed)
    Patient name: Garrett Salas MRN: [id number] DOB: [birth year] Sex: male  05/22/2019 Pre-operative Diagnosis: Right hand ischemia, end-stage renal disease. Post-operative diagnosis:  Same Surgeon:  Eda Paschal. Donzetta Matters, MD Procedure Performed: 1.  Ultrasound-guided cannulation of right arm AV fistula x2 2.  Right upper arm fistulogram 3.  Right upper extremity angiogram with selection of right axillary artery 4.  Left upper extremity venogram  Indications: 68 year old male with end-stage renal disease on dialysis via right upper arm AV fistula.  He has previous access in the left arm that failed.  Findings: He has heavy calcification of his right upper extremity arterial tree.  There is one area of calcification near the anastomosis.  Distally he has radial runoff only to the hand which is very diseased.  There is not appear to be any venous outflow in the left innominate vein.  Right arm AV fistula is patent has some areas of stenosis is heavily calcified 2 segments of pseudoaneurysm but does have central flow patent.   Given these findings I discussed with the patient that he needs to have his fistula ligated and will need tunneled dialysis catheter.  I have also discussed with him given that his left upper extremity does not seem to be suitable for access he will need thigh graft he does have a palpable common femoral artery on the right with a below-knee amputation there.  This time he is not willing to proceed with thigh graft but is willing to discuss ligation of right arm fistula and placement of tunneled catheter.   Procedure:  The patient was identified in the holding area and taken to room 8.  The patient was then placed supine on the table and prepped and draped in the usual sterile fashion.  A time out was called.  Ultrasound was used to evaluate the right upper extremity in the usual fashion.  The fistula was identified cannulated with ultrasound visualization with micropuncture needle  followed by wire and sheath.  An image was saved the permanent record.  Right upper extremity fistulogram was performed including retrograde images.  I then elected to remove the sheath sutured closed the access site.  Ultrasound was used to direct the needle in the opposite direction cannulated with direct ultrasound visualization puncture needle followed by wire and sheath.  Images saved the permanent record again.  We did directed our wire into the brachial artery.  5 French sheath was placed.  We directed the catheter and Glidewire into the axillary artery on the right and perform right upper extremity angiogram.  There did not appear to be any flow-limiting stenosis except for one area next to the anastomosis.  It appeared at this time he would need to have his fistula ligated to preserve his good flow to the hand as possible particularly given the diseased ulnar artery with diseased but patent radial artery and diseased small arteries in the hand on the right.  We did perform left upper extremity venography through the existing IV as well with the above findings.  I discussed with him the need for tunneled dialysis catheter as well as ligation of the right arm fistula and possible placement of right thigh AV graft which she is currently not excepting.  After we completed the procedure the sheath was removed and suture ligated with 4 Monocryl suture.  He tolerated procedure without any complication.    Raquel Racey C. Donzetta Matters, MD [facility] [place] Office: [phone] Pager: [phone]

## 2019-05-22 NOTE — Progress Notes (Signed)
Client states cannot sign name due to pain right hand

## 2019-05-22 NOTE — Progress Notes (Signed)
Dr Donzetta Matters notified of B/P and okay to d/c home and no new orders

## 2019-05-23 ENCOUNTER — Encounter (HOSPITAL_COMMUNITY): Payer: Self-pay | Admitting: Vascular Surgery

## 2019-05-23 ENCOUNTER — Other Ambulatory Visit: Payer: Self-pay

## 2019-05-23 LAB — POCT I-STAT, CHEM 8
BUN: 43 mg/dL — ABNORMAL HIGH (ref 8–23)
Calcium, Ion: 1.05 mmol/L — ABNORMAL LOW (ref 1.15–1.40)
Chloride: 94 mmol/L — ABNORMAL LOW (ref 98–111)
Creatinine, Ser: 7.4 mg/dL — ABNORMAL HIGH (ref 0.61–1.24)
Glucose, Bld: 142 mg/dL — ABNORMAL HIGH (ref 70–99)
HCT: 33 % — ABNORMAL LOW (ref 39.0–52.0)
Hemoglobin: 11.2 g/dL — ABNORMAL LOW (ref 13.0–17.0)
Potassium: 3.9 mmol/L (ref 3.5–5.1)
Sodium: 130 mmol/L — ABNORMAL LOW (ref 135–145)
TCO2: 24 mmol/L (ref 22–32)

## 2019-05-23 NOTE — Addendum Note (Signed)
Addended by: York Cerise C on: 05/23/2019 12:14 PM   Modules accepted: Orders

## 2019-05-23 NOTE — Anesthesia Preprocedure Evaluation (Addendum)
Anesthesia Evaluation  Patient identified by MRN, date of birth, ID band Patient awake    Reviewed: Allergy & Precautions, NPO status , Patient's Chart, lab work & pertinent test results  Airway Mallampati: II  TM Distance: >3 FB Neck ROM: Full    Dental  (+) Edentulous Upper, Edentulous Lower   Pulmonary COPD,  COPD inhaler, Current Smoker,     + decreased breath sounds      Cardiovascular hypertension, Pt. on medications + Peripheral Vascular Disease   Rhythm:Regular Rate:Normal     Neuro/Psych negative psych ROS   GI/Hepatic Neg liver ROS, GERD  Medicated,  Endo/Other  diabetes, Type 2, Insulin Dependent  Renal/GU ESRF and DialysisRenal disease     Musculoskeletal negative musculoskeletal ROS (+)   Abdominal Normal abdominal exam  (+)   Peds  Hematology   Anesthesia Other Findings   Reproductive/Obstetrics                            Anesthesia Physical Anesthesia Plan  ASA: III  Anesthesia Plan: General   Post-op Pain Management:    Induction: Intravenous  PONV Risk Score and Plan: 2 and Ondansetron and Treatment may vary due to age or medical condition  Airway Management Planned: Oral ETT  Additional Equipment: None  Intra-op Plan:   Post-operative Plan: Extubation in OR  Informed Consent: I have reviewed the patients History and Physical, chart, labs and discussed the procedure including the risks, benefits and alternatives for the proposed anesthesia with the patient or authorized representative who has indicated his/her understanding and acceptance.     Dental advisory given  Plan Discussed with: CRNA  Anesthesia Plan Comments: (Chronically elevated troponin. Cath 2012 with minimal CAD.  TTE 2016: - Left ventricle: The cavity size was normal. Wall thickness was  increased in a pattern of severe LVH. Systolic function was  normal. The estimated ejection fraction  was in the range of 55%  to 60%. Wall motion was normal; there were no regional wall  motion abnormalities. Features are consistent with a pseudonormal  left ventricular filling pattern, with concomitant abnormal  relaxation and increased filling pressure (grade 2 diastolic  dysfunction).  - Aortic valve: There was mild regurgitation.  - Left atrium: The atrium was mildly dilated.   Cath 2012: STUDY CONCLUSIONS: 1. Normal LV systolic function with an estimated ejection fraction of     60%. 2. Borderline elevated left ventricular end-diastolic pressure. 3. Mild luminal irregularities without any evidence of obstructive     coronary artery disease.)     Anesthesia Quick Evaluation

## 2019-05-23 NOTE — Progress Notes (Signed)
Spoke with pt for pre-op call. Pt was very impatient during the phone call, kept asking how much longer. Pt denies cardiac history. Pt is a type 2 diabetic. Last A1C was 6.1 on 12/12/18. There was no diabetic medications listed on med list, he states that he takes Lantus in the evening and "regular insulin" in the AM. He didn't know the dosage and he states no one could get it for him at this time. I instructed pt to take 1/2 of his regular dose of Lantus this evening and not to take the "regular" insulin in the AM. Instructed him to check his blood sugar when he gets up in the AM.If blood sugar is 70 or below, treat with 1/2 cup of clear juice (apple or cranberry) and recheck blood sugar 15 minutes after drinking juice. He voiced understanding.    Pt had Covid test done yesterday and it was negative. He went to dialysis, but otherwise has been in quarantine.    Coronavirus Screening  Have you experienced the following symptoms:  Cough NO Fever (>100.22F)  NO Runny nose NO Sore throat NO Difficulty breathing/shortness of breath NO  Have you or a family member traveled in the last 14 days and where? NO   Patient reminded that hospital visitation restrictions are in effect and the importance of the restrictions.

## 2019-05-24 ENCOUNTER — Other Ambulatory Visit: Payer: Self-pay | Admitting: *Deleted

## 2019-05-24 ENCOUNTER — Ambulatory Visit (HOSPITAL_COMMUNITY): Payer: Medicare Other

## 2019-05-24 ENCOUNTER — Ambulatory Visit (HOSPITAL_COMMUNITY)
Admission: RE | Admit: 2019-05-24 | Discharge: 2019-05-24 | Disposition: A | Discharge status: Home / Self Care | Payer: Medicare Other | Attending: Vascular Surgery | Admitting: Vascular Surgery

## 2019-05-24 ENCOUNTER — Encounter (HOSPITAL_COMMUNITY): Payer: Self-pay

## 2019-05-24 ENCOUNTER — Ambulatory Visit (HOSPITAL_COMMUNITY): Payer: Medicare Other | Admitting: Physician Assistant

## 2019-05-24 ENCOUNTER — Other Ambulatory Visit: Payer: Self-pay

## 2019-05-24 ENCOUNTER — Encounter (HOSPITAL_COMMUNITY)
Admission: RE | Disposition: A | Discharge status: Home / Self Care | Payer: Self-pay | Source: Home / Self Care | Attending: Vascular Surgery

## 2019-05-24 DIAGNOSIS — F17210 Nicotine dependence, cigarettes, uncomplicated: Secondary | ICD-10-CM | POA: Insufficient documentation

## 2019-05-24 DIAGNOSIS — I120 Hypertensive chronic kidney disease with stage 5 chronic kidney disease or end stage renal disease: Secondary | ICD-10-CM | POA: Diagnosis present

## 2019-05-24 DIAGNOSIS — E1151 Type 2 diabetes mellitus with diabetic peripheral angiopathy without gangrene: Secondary | ICD-10-CM | POA: Diagnosis not present

## 2019-05-24 DIAGNOSIS — Z794 Long term (current) use of insulin: Secondary | ICD-10-CM | POA: Diagnosis not present

## 2019-05-24 DIAGNOSIS — E785 Hyperlipidemia, unspecified: Secondary | ICD-10-CM | POA: Diagnosis not present

## 2019-05-24 DIAGNOSIS — E1122 Type 2 diabetes mellitus with diabetic chronic kidney disease: Secondary | ICD-10-CM | POA: Diagnosis not present

## 2019-05-24 DIAGNOSIS — Z992 Dependence on renal dialysis: Secondary | ICD-10-CM

## 2019-05-24 DIAGNOSIS — T82898A Other specified complication of vascular prosthetic devices, implants and grafts, initial encounter: Secondary | ICD-10-CM | POA: Diagnosis not present

## 2019-05-24 DIAGNOSIS — Z89511 Acquired absence of right leg below knee: Secondary | ICD-10-CM | POA: Insufficient documentation

## 2019-05-24 DIAGNOSIS — Z79899 Other long term (current) drug therapy: Secondary | ICD-10-CM | POA: Insufficient documentation

## 2019-05-24 DIAGNOSIS — J449 Chronic obstructive pulmonary disease, unspecified: Secondary | ICD-10-CM | POA: Insufficient documentation

## 2019-05-24 DIAGNOSIS — N186 End stage renal disease: Secondary | ICD-10-CM | POA: Diagnosis not present

## 2019-05-24 DIAGNOSIS — Z419 Encounter for procedure for purposes other than remedying health state, unspecified: Secondary | ICD-10-CM

## 2019-05-24 DIAGNOSIS — L98491 Non-pressure chronic ulcer of skin of other sites limited to breakdown of skin: Secondary | ICD-10-CM

## 2019-05-24 HISTORY — PX: LIGATION OF ARTERIOVENOUS  FISTULA: SHX5948

## 2019-05-24 HISTORY — DX: Chronic obstructive pulmonary disease, unspecified: J44.9

## 2019-05-24 HISTORY — PX: INSERTION OF DIALYSIS CATHETER: SHX1324

## 2019-05-24 LAB — GLUCOSE, CAPILLARY: Glucose-Capillary: 132 mg/dL — ABNORMAL HIGH (ref 70–99)

## 2019-05-24 LAB — POCT I-STAT 4, (NA,K, GLUC, HGB,HCT)
Glucose, Bld: 150 mg/dL — ABNORMAL HIGH (ref 70–99)
HCT: 33 % — ABNORMAL LOW (ref 39.0–52.0)
Hemoglobin: 11.2 g/dL — ABNORMAL LOW (ref 13.0–17.0)
Potassium: 3.5 mmol/L (ref 3.5–5.1)
Sodium: 132 mmol/L — ABNORMAL LOW (ref 135–145)

## 2019-05-24 SURGERY — LIGATION OF ARTERIOVENOUS  FISTULA
Anesthesia: General | Site: Chest | Laterality: Right

## 2019-05-24 MED ORDER — ONDANSETRON HCL 4 MG/2ML IJ SOLN
INTRAMUSCULAR | Status: AC
  Filled 2019-05-24: qty 2

## 2019-05-24 MED ORDER — MIDAZOLAM HCL 2 MG/2ML IJ SOLN
INTRAMUSCULAR | Status: AC
  Filled 2019-05-24: qty 2

## 2019-05-24 MED ORDER — CEFAZOLIN SODIUM-DEXTROSE 2-4 GM/100ML-% IV SOLN
2.0000 g | INTRAVENOUS | Status: AC
  Administered 2019-05-24: 2 g via INTRAVENOUS
  Filled 2019-05-24: qty 100

## 2019-05-24 MED ORDER — SODIUM CHLORIDE 0.9 % IV SOLN
INTRAVENOUS | Status: DC | PRN
  Administered 2019-05-24: 500 mL

## 2019-05-24 MED ORDER — LIDOCAINE HCL (PF) 1 % IJ SOLN
INTRAMUSCULAR | Status: AC
  Filled 2019-05-24: qty 30

## 2019-05-24 MED ORDER — SODIUM CHLORIDE 0.9 % IV SOLN
INTRAVENOUS | Status: AC
  Filled 2019-05-24: qty 1.2

## 2019-05-24 MED ORDER — LIDOCAINE 2% (20 MG/ML) 5 ML SYRINGE
INTRAMUSCULAR | Status: AC
  Filled 2019-05-24: qty 5

## 2019-05-24 MED ORDER — CHLORHEXIDINE GLUCONATE 4 % EX LIQD: 60.0000 mL | Freq: Once | CUTANEOUS | Status: DC

## 2019-05-24 MED ORDER — PROPOFOL 10 MG/ML IV BOLUS
INTRAVENOUS | Status: DC | PRN
  Administered 2019-05-24: 140 mg via INTRAVENOUS

## 2019-05-24 MED ORDER — LIDOCAINE 2% (20 MG/ML) 5 ML SYRINGE
INTRAMUSCULAR | Status: DC | PRN
  Administered 2019-05-24: 40 mg via INTRAVENOUS

## 2019-05-24 MED ORDER — LABETALOL HCL 5 MG/ML IV SOLN
INTRAVENOUS | Status: AC
  Administered 2019-05-24: 11:00:00 5 mg via INTRAVENOUS
  Filled 2019-05-24: qty 4

## 2019-05-24 MED ORDER — FENTANYL CITRATE (PF) 100 MCG/2ML IJ SOLN
25.0000 ug | INTRAMUSCULAR | Status: DC | PRN
  Administered 2019-05-24 (×2): 25 ug via INTRAVENOUS

## 2019-05-24 MED ORDER — FENTANYL CITRATE (PF) 100 MCG/2ML IJ SOLN
INTRAMUSCULAR | Status: AC
  Administered 2019-05-24: 11:00:00 25 ug via INTRAVENOUS
  Filled 2019-05-24: qty 2

## 2019-05-24 MED ORDER — IODIXANOL 320 MG/ML IV SOLN
INTRAVENOUS | Status: DC | PRN
  Administered 2019-05-24: 20 mL

## 2019-05-24 MED ORDER — SUCCINYLCHOLINE CHLORIDE 200 MG/10ML IV SOSY
PREFILLED_SYRINGE | INTRAVENOUS | Status: AC
  Filled 2019-05-24: qty 10

## 2019-05-24 MED ORDER — HYDROCODONE-ACETAMINOPHEN 5-325 MG PO TABS: 1.0000 | ORAL_TABLET | Freq: Four times a day (QID) | ORAL | 0 refills | Status: DC | PRN

## 2019-05-24 MED ORDER — HEPARIN SODIUM (PORCINE) 1000 UNIT/ML IJ SOLN
INTRAMUSCULAR | Status: AC
  Filled 2019-05-24: qty 1

## 2019-05-24 MED ORDER — 0.9 % SODIUM CHLORIDE (POUR BTL) OPTIME
TOPICAL | Status: DC | PRN
  Administered 2019-05-24: 10:00:00 1000 mL

## 2019-05-24 MED ORDER — FENTANYL CITRATE (PF) 100 MCG/2ML IJ SOLN
INTRAMUSCULAR | Status: DC | PRN
  Administered 2019-05-24: 25 ug via INTRAVENOUS
  Administered 2019-05-24: 50 ug via INTRAVENOUS
  Administered 2019-05-24: 25 ug via INTRAVENOUS
  Administered 2019-05-24: 50 ug via INTRAVENOUS

## 2019-05-24 MED ORDER — FENTANYL CITRATE (PF) 250 MCG/5ML IJ SOLN
INTRAMUSCULAR | Status: AC
  Filled 2019-05-24: qty 5

## 2019-05-24 MED ORDER — ONDANSETRON HCL 4 MG/2ML IJ SOLN
INTRAMUSCULAR | Status: DC | PRN
  Administered 2019-05-24: 4 mg via INTRAVENOUS

## 2019-05-24 MED ORDER — PROPOFOL 10 MG/ML IV BOLUS
INTRAVENOUS | Status: AC
  Filled 2019-05-24: qty 20

## 2019-05-24 MED ORDER — LABETALOL HCL 5 MG/ML IV SOLN
5.0000 mg | INTRAVENOUS | Status: AC | PRN
  Administered 2019-05-24 (×2): 5 mg via INTRAVENOUS

## 2019-05-24 MED ORDER — SUCCINYLCHOLINE CHLORIDE 20 MG/ML IJ SOLN
INTRAMUSCULAR | Status: DC | PRN
  Administered 2019-05-24: 100 mg via INTRAVENOUS

## 2019-05-24 MED ORDER — LIDOCAINE-EPINEPHRINE (PF) 1 %-1:200000 IJ SOLN
INTRAMUSCULAR | Status: AC
  Filled 2019-05-24: qty 30

## 2019-05-24 MED ORDER — SODIUM CHLORIDE 0.9 % IV SOLN
INTRAVENOUS | Status: DC
  Administered 2019-05-24: 08:00:00 via INTRAVENOUS

## 2019-05-24 MED ORDER — HEPARIN SODIUM (PORCINE) 1000 UNIT/ML IJ SOLN
INTRAMUSCULAR | Status: DC | PRN
  Administered 2019-05-24: 3400 [IU] via INTRAVENOUS

## 2019-05-24 MED ORDER — SODIUM CHLORIDE 0.9 % IV SOLN
INTRAVENOUS | Status: DC | PRN
  Administered 2019-05-24: 35 ug/min via INTRAVENOUS

## 2019-05-24 SURGICAL SUPPLY — 61 items
BAG DECANTER FOR FLEXI CONT (MISCELLANEOUS) ×7 IMPLANT
BIOPATCH RED 1 DISK 7.0 (GAUZE/BANDAGES/DRESSINGS) ×4 IMPLANT
BIOPATCH RED 1IN DISK 7.0MM (GAUZE/BANDAGES/DRESSINGS) ×1
CANISTER SUCT 3000ML PPV (MISCELLANEOUS) ×5 IMPLANT
CATH BEACON 5.038 65CM KMP-01 (CATHETERS) ×2 IMPLANT
CATH PALINDROME RT-P 15FX19CM (CATHETERS) IMPLANT
CATH PALINDROME RT-P 15FX23CM (CATHETERS) ×2 IMPLANT
CATH PALINDROME RT-P 15FX28CM (CATHETERS) IMPLANT
CATH PALINDROME RT-P 15FX55CM (CATHETERS) IMPLANT
CLIP VESOCCLUDE MED 6/CT (CLIP) ×5 IMPLANT
CLIP VESOCCLUDE SM WIDE 6/CT (CLIP) ×5 IMPLANT
COVER PROBE W GEL 5X96 (DRAPES) ×5 IMPLANT
COVER SURGICAL LIGHT HANDLE (MISCELLANEOUS) ×5 IMPLANT
COVER WAND RF STERILE (DRAPES) ×5 IMPLANT
DERMABOND ADVANCED (GAUZE/BANDAGES/DRESSINGS) ×4
DERMABOND ADVANCED .7 DNX12 (GAUZE/BANDAGES/DRESSINGS) ×3 IMPLANT
DEVICE TORQUE KENDALL .025-038 (MISCELLANEOUS) ×2 IMPLANT
DRAPE C-ARM 42X72 X-RAY (DRAPES) ×5 IMPLANT
DRAPE CHEST BREAST 15X10 FENES (DRAPES) ×3 IMPLANT
DRAPE INCISE IOBAN 66X45 STRL (DRAPES) ×3 IMPLANT
DRSG COVADERM 4X6 (GAUZE/BANDAGES/DRESSINGS) ×2 IMPLANT
ELECT REM PT RETURN 9FT ADLT (ELECTROSURGICAL) ×5
ELECTRODE REM PT RTRN 9FT ADLT (ELECTROSURGICAL) ×3 IMPLANT
GAUZE 4X4 16PLY RFD (DISPOSABLE) ×3 IMPLANT
GLOVE BIO SURGEON STRL SZ7.5 (GLOVE) ×7 IMPLANT
GOWN STRL REUS W/ TWL LRG LVL3 (GOWN DISPOSABLE) ×6 IMPLANT
GOWN STRL REUS W/ TWL XL LVL3 (GOWN DISPOSABLE) ×3 IMPLANT
GOWN STRL REUS W/TWL LRG LVL3 (GOWN DISPOSABLE) ×4
GOWN STRL REUS W/TWL XL LVL3 (GOWN DISPOSABLE) ×4
GUIDEWIRE ANGLED .035X150CM (WIRE) ×2 IMPLANT
HEMOSTAT SNOW SURGICEL 2X4 (HEMOSTASIS) IMPLANT
INSERT FOGARTY SM (MISCELLANEOUS) ×5 IMPLANT
KIT BASIN OR (CUSTOM PROCEDURE TRAY) ×5 IMPLANT
KIT TURNOVER KIT B (KITS) ×5 IMPLANT
NDL 18GX1X1/2 (RX/OR ONLY) (NEEDLE) ×3 IMPLANT
NDL HYPO 25GX1X1/2 BEV (NEEDLE) ×3 IMPLANT
NEEDLE 18GX1X1/2 (RX/OR ONLY) (NEEDLE) ×5 IMPLANT
NEEDLE HYPO 25GX1X1/2 BEV (NEEDLE) IMPLANT
NS IRRIG 1000ML POUR BTL (IV SOLUTION) ×5 IMPLANT
PACK CV ACCESS (CUSTOM PROCEDURE TRAY) ×5 IMPLANT
PACK SURGICAL SETUP 50X90 (CUSTOM PROCEDURE TRAY) ×5 IMPLANT
PAD ARMBOARD 7.5X6 YLW CONV (MISCELLANEOUS) ×10 IMPLANT
SHEATH PINNACLE 5F 10CM (SHEATH) ×2 IMPLANT
SOAP 2 % CHG 4 OZ (WOUND CARE) ×3 IMPLANT
SUT ETHILON 3 0 PS 1 (SUTURE) ×5 IMPLANT
SUT MNCRL AB 4-0 PS2 18 (SUTURE) ×9 IMPLANT
SUT PROLENE 5 0 C 1 24 (SUTURE) ×2 IMPLANT
SUT PROLENE 6 0 BV (SUTURE) ×10 IMPLANT
SUT SILK 0 TIES 10X30 (SUTURE) ×3 IMPLANT
SUT VIC AB 3-0 SH 27 (SUTURE) ×4
SUT VIC AB 3-0 SH 27X BRD (SUTURE) ×6 IMPLANT
SYR 10ML LL (SYRINGE) ×9 IMPLANT
SYR 20CC LL (SYRINGE) ×10 IMPLANT
SYR 5ML LL (SYRINGE) ×5 IMPLANT
SYR CONTROL 10ML LL (SYRINGE) ×5 IMPLANT
TOWEL GREEN STERILE (TOWEL DISPOSABLE) ×5 IMPLANT
TOWEL GREEN STERILE FF (TOWEL DISPOSABLE) ×5 IMPLANT
TOWEL OR 17X26 4PK STRL BLUE (TOWEL DISPOSABLE) ×5 IMPLANT
UNDERPAD 30X30 (UNDERPADS AND DIAPERS) ×5 IMPLANT
WATER STERILE IRR 1000ML POUR (IV SOLUTION) ×5 IMPLANT
WIRE ROSEN 145CM (WIRE) ×2 IMPLANT

## 2019-05-24 NOTE — Anesthesia Procedure Notes (Signed)
Procedure Name: Intubation Date/Time: 05/24/2019 8:53 AM Performed by: Trinna Post., CRNA Pre-anesthesia Checklist: Patient identified, Emergency Drugs available, Suction available, Patient being monitored and Timeout performed Patient Re-evaluated:Patient Re-evaluated prior to induction Oxygen Delivery Method: Circle system utilized Preoxygenation: Pre-oxygenation with 100% oxygen Induction Type: IV induction, Rapid sequence and Cricoid Pressure applied Laryngoscope Size: Mac and 4 Grade View: Grade I Tube type: Oral Tube size: 7.5 mm Number of attempts: 1 Airway Equipment and Method: Stylet Placement Confirmation: ETT inserted through vocal cords under direct vision,  positive ETCO2 and breath sounds checked- equal and bilateral Secured at: 23 cm Tube secured with: Tape Dental Injury: Teeth and Oropharynx as per pre-operative assessment

## 2019-05-24 NOTE — Transfer of Care (Signed)
Immediate Anesthesia Transfer of Care Note  Patient: Garrett Salas  Procedure(s) Performed: LIGATION OF ARTERIOVENOUS  FISTULA RIGHT ARM (Right Arm Upper) INSERTION OF DIALYSIS CATHETER (N/A Chest)  Patient Location: PACU  Anesthesia Type:General  Level of Consciousness: drowsy  Airway & Oxygen Therapy: Patient Spontanous Breathing and Patient connected to face mask oxygen  Post-op Assessment: Report given to RN, Post -op Vital signs reviewed and stable and BP high but at patients baseline. Dr Smith Robert notified.   Post vital signs: Reviewed and stable  Last Vitals:  Vitals Value Taken Time  BP 256/96 05/24/19 1009  Temp    Pulse 72 05/24/19 1011  Resp 13 05/24/19 1011  SpO2 100 % 05/24/19 1011  Vitals shown include unvalidated device data.  Last Pain:  Vitals:   05/24/19 0708  PainSc: 10-Worst pain ever      Patients Stated Pain Goal: 5 (46/21/94 7125)  Complications: No apparent anesthesia complications

## 2019-05-24 NOTE — H&P (Signed)
History of Present Illness  Garrett Houstonis a 68 y.o.male, who ishere today for evaluation. He has a long history of end-stage renal disease. He has a functioning right upper arm AV fistula which is been present for many years. He had had several prior failed left arm accesses. He has a history of bilateral below-knee amputations due to peripheral vascular disease. Over the last 6 months he is reported a superficial ulceration over the medial aspect of his mid second finger on the right. He reports that this is had some peeling of skin and he does have some chronic pain associated with this.   Past Medical History:  Diagnosis Date  . COPD (chronic obstructive pulmonary disease) ([place])   . Diabetes mellitus    Type 2. Diagnosed in mid 56s. Currently insulin requiring  . Diabetic neuropathy ([place])   . Diabetic retinopathy    legally blind  . Dialysis patient ([place])   . ESRD (end stage renal disease) on dialysis ([place])    due to diabetic nephropathy. T/Th/Sat  . Hx of right BKA ([place])   . Hyperlipidemia   . Hypertension   . Peripheral vascular disease ([place])   . Tobacco abuse     Past Surgical History:  Procedure Laterality Date  . A/V FISTULAGRAM N/A 05/22/2019   Procedure: A/V FISTULAGRAM - Right Arm;  Surgeon: Waynetta Sandy, MD;  Location: [facility];  Service: Cardiovascular;  Laterality: N/A;  . ABDOMINAL AORTAGRAM N/A 01/07/2015   Procedure: ABDOMINAL Maxcine Ham;  Surgeon: Conrad [place], MD;  Location: [facility] CATH LAB;  Service: Cardiovascular;  Laterality: N/A;  . AMPUTATION Right 01/09/2015   Procedure: AMPUTATION BELOW KNEE RIGHT LEG;  Surgeon: Conrad [place], MD;  Location: [facility];  Service: Vascular;  Laterality: Right;  . AMPUTATION Left 05/16/2015   Procedure: LEFT BELOW THE KNEE AMPUTATION;  Surgeon: Conrad , MD;  Location: [facility];  Service: Vascular;  Laterality: Left;  . AV FISTULA PLACEMENT    . CARDIAC CATHETERIZATION  05/06/2011   no significant  CAD, EF 55-60%, LVEDP :15   . none    . UPPER EXTREMITY ANGIOGRAPHY Right 05/22/2019   Procedure: Upper Extremity Angiography;  Surgeon: Waynetta Sandy, MD;  Location: [facility];  Service: Cardiovascular;  Laterality: Right;  . UPPER EXTREMITY VENOGRAPHY Left 05/22/2019   Procedure: UPPER EXTREMITY VENOGRAPHY;  Surgeon: Waynetta Sandy, MD;  Location: [facility];  Service: Cardiovascular;  Laterality: Left;    No Known Allergies  Prior to Admission medications   Medication Sig Start Date End Date Taking? Authorizing Provider  ACCU-CHEK AVIVA PLUS test strip  01/17/15  Yes [provider]  ACCU-CHEK SOFTCLIX LANCETS lancets  01/16/15  Yes [provider]  albuterol (PROVENTIL HFA;VENTOLIN HFA) 108 (90 BASE) MCG/ACT inhaler Inhale 2 puffs into the lungs every 6 (six) hours as needed for wheezing or shortness of breath.   Yes [provider]  amLODipine (NORVASC) 10 MG tablet Take 1 tablet (10 mg total) by mouth daily. 05/24/15  Yes Nita Sells, MD  atorvastatin (LIPITOR) 40 MG tablet Take 40 mg by mouth daily.   Yes [provider]  AURYXIA 1 GM 210 MG(Fe) tablet Take 420 mg by mouth 3 (three) times daily with meals.  02/15/19  Yes [provider]  B-D UF III MINI PEN NEEDLES 31G X 5 MM MISC  01/21/15  Yes [provider]  Blood Glucose Monitoring Suppl (ACCU-CHEK AVIVA PLUS) W/DEVICE KIT  01/16/15  Yes [provider]  calcitRIOL (ROCALTROL) 0.5 MCG capsule Take 1 capsule (0.5 mcg total) by mouth Every Tuesday,Thursday,and Saturday with dialysis. 05/24/15  Yes Nita Sells, MD  cloNIDine (CATAPRES) 0.1 MG tablet Take 0.1 mg by mouth 3 (three) times daily. 12/29/17  Yes [provider]  diphenhydrAMINE (BENADRYL) 25 MG tablet Take 25 mg by mouth as needed for itching.   Yes [provider]  hydrALAZINE (APRESOLINE) 25 MG tablet Take 1 tablet (25 mg total) by mouth every 6 (six)  hours. 05/24/15  Yes Nita Sells, MD  insulin glargine (LANTUS) 100 UNIT/ML injection Inject into the skin every evening. Pt will tell us dosage day of surgery.   Yes [provider]  LYRICA 75 MG capsule Take 75 mg by mouth 3 (three) times daily as needed (pain).  04/23/15  Yes [provider]  metoCLOPramide (REGLAN) 5 MG tablet Take 5 mg by mouth 3 (three) times daily before meals.  03/18/18  Yes [provider]  Multiple Vitamin (DAILY VITE PO) Take 1 tablet by mouth Every Tuesday,Thursday,and Saturday with dialysis.   Yes [provider]  pantoprazole (PROTONIX) 40 MG tablet Take 1 tablet (40 mg total) by mouth daily. 05/24/15  Yes Nita Sells, MD  Flossie Buffy INSULIN SYRINGE 29G X 1/2" 0.5 ML MISC  04/08/15  Yes [provider]    Social History   Socioeconomic History  . Marital status: Married    Spouse name: Not on file  . Number of children: 1  . Years of education: Not on file  . Highest education level: Not on file  Occupational History  . Occupation: retired  Scientific laboratory technician  . Financial resource strain: Not on file  . Food insecurity    Worry: Not on file    Inability: Not on file  . Transportation needs    Medical: Not on file    Non-medical: Not on file  Tobacco Use  . Smoking status: Light Tobacco Smoker    Packs/day: 1.00    Years: 30.00    Pack years: 30.00    Types: Cigarettes  . Smokeless tobacco: Never Used  Substance and Sexual Activity  . Alcohol use: No    Alcohol/week: 0.0 standard drinks  . Drug use: Yes    Types: Marijuana  . Sexual activity: Not on file  Lifestyle  . Physical activity    Days per week: Not on file    Minutes per session: Not on file  . Stress: Not on file  Relationships  . Social Herbalist on phone: Not on file    Gets together: Not on file    Attends religious service: Not on file    Active member of club or organization: Not on file    Attends meetings of clubs  or organizations: Not on file    Relationship status: Not on file  . Intimate partner violence    Fear of current or ex partner: Not on file    Emotionally abused: Not on file    Physically abused: Not on file    Forced sexual activity: Not on file  Other Topics Concern  . Not on file  Social History Narrative  . Not on file     Family History  Problem Relation Age of Onset  . Diabetes Mother   . Heart disease Other   . Alcohol abuse Other   . Diabetes Sister   . Diabetes Brother     ROS:  negative except hpi  Physical Examination  Vitals:   05/24/19 0708  BP: (!) 190/65  Pulse: 64  Resp: 18  Temp: 98.8 F (37.1 C)  SpO2: 97%   Body mass index is 24.03 kg/m.  GENERAL: The patient is a well-nourishedmale, in no acute distress. The vital signs are documented above. CARDIOVASCULAR:His radial pulses bilaterally are absent. He has extremely calcified vessels which are palpable at the radial level bilaterally. He does not have ulnar pulses. PULMONARY: There is good air exchange  ABDOMEN:Soft and non-tender  MUSCULOSKELETAL:There are no major deformities or cyanosis.Bilateral below-knee amputations NEUROLOGIC:No focal weakness or paresthesias are detected. SKIN:There are no ulcers or rashes noted.1 cm ulceration over the medial aspect of his right second finger PSYCHIATRIC:The patient has a normal affect.  CBC    Component Value Date/Time   WBC 7.8 09/04/2018 0322   RBC 4.62 09/04/2018 0322   HGB 11.2 (L) 05/24/2019 0651   HCT 33.0 (L) 05/24/2019 0651   HCT 29.5 (L) 05/20/2015 1945   PLT 88 (L) 09/04/2018 0322   MCV 94.2 09/04/2018 0322   MCH 28.8 09/04/2018 0322   MCHC 30.6 09/04/2018 0322   RDW 15.9 (H) 09/04/2018 0322   LYMPHSABS 0.7 09/03/2018 1631   MONOABS 0.4 09/03/2018 1631   EOSABS 0.0 09/03/2018 1631   BASOSABS 0.0 09/03/2018 1631    BMET    Component Value Date/Time   NA 132 (L) 05/24/2019 0651   K 3.5 05/24/2019 0651   CL 94 (L)  05/22/2019 1123   CO2 20 (L) 09/04/2018 0322   GLUCOSE 150 (H) 05/24/2019 0651   BUN 43 (H) 05/22/2019 1123   CREATININE 7.40 (H) 05/22/2019 1123   CALCIUM 8.6 (L) 09/04/2018 0322   GFRNONAA 11 (L) 09/04/2018 0322   GFRAA 12 (L) 09/04/2018 0322    COAGS: Lab Results  Component Value Date   INR 1.12 09/03/2018   INR 1.11 05/16/2015   INR 1.08 01/07/2015       ASSESSMENT/PLAN: This is a 68 y.o. male with ischemic left hand with upper arm avf. Will ligate avf and place tdc. Also plan for thigh graft on right today in OR. He understands risks and benefits. Will more than likely require hand surgery evaluation as outpatient.    Tejasvi Brissett C. Donzetta Matters, MD [facility] Office: [phone] Pager: [phone]

## 2019-05-24 NOTE — Anesthesia Postprocedure Evaluation (Signed)
Anesthesia Post Note  Patient: Garrett Salas  Procedure(s) Performed: LIGATION OF ARTERIOVENOUS  FISTULA RIGHT ARM (Right Arm Upper) INSERTION OF DIALYSIS CATHETER (N/A Chest)     Patient location during evaluation: PACU Anesthesia Type: General Level of consciousness: awake and alert Pain management: pain level controlled Vital Signs Assessment: post-procedure vital signs reviewed and stable Respiratory status: spontaneous breathing, nonlabored ventilation, respiratory function stable and patient connected to nasal cannula oxygen Cardiovascular status: blood pressure returned to baseline and stable Postop Assessment: no apparent nausea or vomiting Anesthetic complications: no    Last Vitals:  Vitals:   05/24/19 1125 05/24/19 1148  BP: (!) 192/84 (!) 191/90  Pulse: 61 61  Resp: 14 16  Temp: (!) 36.3 C   SpO2: 97% 100%    Last Pain:  Vitals:   05/24/19 1148  PainSc: 0-No pain                 Effie Berkshire

## 2019-05-24 NOTE — Progress Notes (Signed)
Wasted Fentanyl 50 mcg IV in the stericycle. Witnessed by Catering manager.

## 2019-05-24 NOTE — Discharge Instructions (Signed)
Incision Care, Adult °An incision is a cut that a doctor makes in your skin for surgery (for a procedure). Most times, these cuts are closed after surgery. Your cut from surgery may be closed with stitches (sutures), staples, skin glue, or skin tape (adhesive strips). You may need to return to your doctor to have stitches or staples taken out. This may happen many days or many weeks after your surgery. The cut needs to be well cared for so it does not get infected. °How to care for your cut °Cut care ° °· Follow instructions from your doctor about how to take care of your cut. Make sure you: °? Wash your hands with soap and water before you change your bandage (dressing). If you cannot use soap and water, use hand sanitizer. °? Change your bandage as told by your doctor. °? Leave stitches, skin glue, or skin tape in place. They may need to stay in place for 2 weeks or longer. If tape strips get loose and curl up, you may trim the loose edges. Do not remove tape strips completely unless your doctor says it is okay. °· Check your cut area every day for signs of infection. Check for: °? More redness, swelling, or pain. °? More fluid or blood. °? Warmth. °? Pus or a bad smell. °· Ask your doctor how to clean the cut. This may include: °? Using mild soap and water. °? Using a clean towel to pat the cut dry after you clean it. °? Putting a cream or ointment on the cut. Do this only as told by your doctor. °? Covering the cut with a clean bandage. °· Ask your doctor when you can leave the cut uncovered. °· Do not take baths, swim, or use a hot tub until your doctor says it is okay. Ask your doctor if you can take showers. You may only be allowed to take sponge baths for bathing. °Medicines °· If you were prescribed an antibiotic medicine, cream, or ointment, take the antibiotic or put it on the cut as told by your doctor. Do not stop taking or putting on the antibiotic even if your condition gets better. °· Take  over-the-counter and prescription medicines only as told by your doctor. °General instructions °· Limit movement around your cut. This helps healing. °? Avoid straining, lifting, or exercise for the first month, or for as long as told by your doctor. °? Follow instructions from your doctor about going back to your normal activities. °? Ask your doctor what activities are safe. °· Protect your cut from the sun when you are outside for the first 6 months, or for as long as told by your doctor. Put on sunscreen around the scar or cover up the scar. °· Keep all follow-up visits as told by your doctor. This is important. °Contact a doctor if: °· Your have more redness, swelling, or pain around the cut. °· You have more fluid or blood coming from the cut. °· Your cut feels warm to the touch. °· You have pus or a bad smell coming from the cut. °· You have a fever or shaking chills. °· You feel sick to your stomach (nauseous) or you throw up (vomit). °· You are dizzy. °· Your stitches or staples come undone. °Get help right away if: °· You have a red streak coming from your cut. °· Your cut bleeds through the bandage and the bleeding does not stop with gentle pressure. °· The edges of your cut   open up and separate. °· You have very bad (severe) pain. °· You have a rash. °· You are confused. °· You pass out (faint). °· You have trouble breathing and you have a fast heartbeat. °This information is not intended to replace advice given to you by your health care provider. Make sure you discuss any questions you have with your health care provider. °Document Released: 01/25/2012 Document Revised: 03/22/2017 Document Reviewed: 07/10/2016 °Elsevier Patient Education © 2020 [employer] ° °

## 2019-05-24 NOTE — Op Note (Signed)
    Patient name: Garrett Salas MRN: [id number] DOB: 1950/12/11 Sex: male  05/24/2019 Pre-operative Diagnosis: End-stage renal disease, right upper extremity steal Post-operative diagnosis:  Same Surgeon:  Eda Paschal. Donzetta Matters, MD Procedure Performed: 1.  US guided cannulation of left IJ vein 2.  Central venogram 3.  Placement of 23cm tunneled dialysis catheter 4.  Ligation of right arm avf   Indications: 68 year old male with end-stage renal disease on dialysis via right upper extremity AV fistula.  He now has ulceration of his right index finger.  Angiography did not demonstrate any lesions to repair of his right upper extremity arterial tree.  He is now indicated for ligation of his fistula and will need access via catheter.  We have also considered possible femoral graft.  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  Left IJ was patent.  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  We were able to cross this placed a 23 cm catheter centrally at the SVC atrial junction.  The right arm AV fistula was ligated and there was improved radial artery signal at the wrist after this.  There is no palpable left common femoral pulse the right common femoral artery is heavily calcified with 1+ pulse at best.   Procedure:  The patient was identified in the holding area and taken to the operating room where she was supine operative general anesthesia induced.  He was sterilely prepped and draped in the bilateral neck and chest and right upper extremity antibiotics were minister and timeout called.  Ultrasound was used to identify first the right internal jugular vein which was noted to be likely occluded centrally.  I was able to cannulate the left internal jugular vein.  I placed a J-wire but could not pass essentially.  5 French sheath was placed.  I use Glidewire and bare catheter to pass centrally.  I performed venography which demonstrated patency of his SVC near the atrial junction.  I was able to get a Rosen  wire into the IVC.  I then serially dilated the tract.  A 23 cm catheter was tunneled from a separate stab incision.  This was placed over the wire into the SVC atrial junction.  Catheter was assembled flushed with heparinized saline affixed the skin with 3-0 nylon suture.  Neck incision closed with 4 Monocryl and Dermabond was placed to both sides.  We turned our attention to the right upper extremity.  Ultrasound was used to identify the fistula.  Transverse incision was made I dissected out the fistula.  I clamped it and tested the radial signal which was actually improved after clamping the fistula.  Fistula was divided tied off distally.  Towards the antecubitum I oversewed with 5-0 Prolene suture in a running mattress fashion.  I irrigated the wound and closed in layers with Vicryl and Monocryl.  He was then awakened anesthesia having tolerated procedure without immediate complication.  All counts were correct at completion.  I elected not to placed a femoral graft in this patient given that he had minimal femoral pulse on the right and was heavily calcified.  EBL: 30 cc    Coy Rochford C. Donzetta Matters, MD [facility] Office: [phone] Pager: [phone]

## 2019-05-25 ENCOUNTER — Encounter (HOSPITAL_COMMUNITY): Payer: Self-pay | Admitting: Vascular Surgery

## 2019-06-07 ENCOUNTER — Other Ambulatory Visit: Payer: Self-pay | Admitting: Orthopedic Surgery

## 2019-06-09 ENCOUNTER — Encounter (HOSPITAL_COMMUNITY): Payer: Self-pay | Admitting: *Deleted

## 2019-06-09 ENCOUNTER — Other Ambulatory Visit: Payer: Self-pay

## 2019-06-09 NOTE — Progress Notes (Signed)
Pt requested that spouse, Elwyn Lade, complete SDW-pre-op assessment. Spouse denies that pt C/O SOB and chest pain. Spouse denies that pt is under the care of a cardiologist. Spouse stated that pt will not be able to have COVID-19 test at [facility] on Sat. Spouse made aware to have pt stop taking  vitamins, fish oil and herbal medications. Do not take any NSAIDs ie: Ibuprofen, Advil, Naproxen (Aleve), Motrin, BC and Goody Powder. Spouse made aware to have pt take 7 units of Lantus on Sunday night. Spouse made aware to have pt check BG every 2 hours prior to arrival to hospital on DOS, if BG > 220 take 1/2 of SS correction dose. Spouse  made aware to treat a BG < 70 with 4 ounces of apple or cranberry juice, wait 15 minutes after intervention to recheck BG, if BG remains < 70, call Short Stay unit to speak with a nurse.   Spouse denies that pt and family members tested positive for COVID-19.  Coronavirus Screening  Spouse denies that pt and family members experienced the following symptoms:  Cough yes/no: No Fever (>100.41F)  yes/no: No Runny nose yes/no: No Sore throat yes/no: No Difficulty breathing/shortness of breath  yes/no: No  Have you or a family member traveled in the last 14 days and where? yes/no: No  Spouse made aware that hospital visitation restrictions are in effect and the importance of the restrictions.   Spouse verbalized understanding of all pre-op instructions.

## 2019-06-12 ENCOUNTER — Ambulatory Visit (HOSPITAL_COMMUNITY): Payer: Medicare Other | Admitting: Registered Nurse

## 2019-06-12 ENCOUNTER — Encounter (HOSPITAL_COMMUNITY)
Admission: RE | Disposition: A | Discharge status: Home / Self Care | Payer: Self-pay | Source: Home / Self Care | Attending: Orthopedic Surgery

## 2019-06-12 ENCOUNTER — Other Ambulatory Visit: Payer: Self-pay

## 2019-06-12 ENCOUNTER — Ambulatory Visit (HOSPITAL_COMMUNITY)
Admission: RE | Admit: 2019-06-12 | Discharge: 2019-06-12 | Disposition: A | Discharge status: Home / Self Care | Payer: Medicare Other | Attending: Orthopedic Surgery | Admitting: Orthopedic Surgery

## 2019-06-12 ENCOUNTER — Encounter (HOSPITAL_COMMUNITY): Payer: Self-pay

## 2019-06-12 DIAGNOSIS — E1152 Type 2 diabetes mellitus with diabetic peripheral angiopathy with gangrene: Secondary | ICD-10-CM | POA: Insufficient documentation

## 2019-06-12 DIAGNOSIS — Z8249 Family history of ischemic heart disease and other diseases of the circulatory system: Secondary | ICD-10-CM | POA: Diagnosis not present

## 2019-06-12 DIAGNOSIS — Z794 Long term (current) use of insulin: Secondary | ICD-10-CM | POA: Insufficient documentation

## 2019-06-12 DIAGNOSIS — E1151 Type 2 diabetes mellitus with diabetic peripheral angiopathy without gangrene: Secondary | ICD-10-CM | POA: Diagnosis present

## 2019-06-12 DIAGNOSIS — E785 Hyperlipidemia, unspecified: Secondary | ICD-10-CM | POA: Diagnosis not present

## 2019-06-12 DIAGNOSIS — Z89512 Acquired absence of left leg below knee: Secondary | ICD-10-CM | POA: Diagnosis not present

## 2019-06-12 DIAGNOSIS — N186 End stage renal disease: Secondary | ICD-10-CM | POA: Insufficient documentation

## 2019-06-12 DIAGNOSIS — X58XXXA Exposure to other specified factors, initial encounter: Secondary | ICD-10-CM | POA: Diagnosis not present

## 2019-06-12 DIAGNOSIS — Z7951 Long term (current) use of inhaled steroids: Secondary | ICD-10-CM | POA: Insufficient documentation

## 2019-06-12 DIAGNOSIS — E11319 Type 2 diabetes mellitus with unspecified diabetic retinopathy without macular edema: Secondary | ICD-10-CM | POA: Insufficient documentation

## 2019-06-12 DIAGNOSIS — S61200A Unspecified open wound of right index finger without damage to nail, initial encounter: Secondary | ICD-10-CM | POA: Insufficient documentation

## 2019-06-12 DIAGNOSIS — Z811 Family history of alcohol abuse and dependence: Secondary | ICD-10-CM | POA: Diagnosis not present

## 2019-06-12 DIAGNOSIS — Z992 Dependence on renal dialysis: Secondary | ICD-10-CM | POA: Diagnosis not present

## 2019-06-12 DIAGNOSIS — Z20828 Contact with and (suspected) exposure to other viral communicable diseases: Secondary | ICD-10-CM | POA: Diagnosis not present

## 2019-06-12 DIAGNOSIS — F17210 Nicotine dependence, cigarettes, uncomplicated: Secondary | ICD-10-CM | POA: Diagnosis not present

## 2019-06-12 DIAGNOSIS — I120 Hypertensive chronic kidney disease with stage 5 chronic kidney disease or end stage renal disease: Secondary | ICD-10-CM | POA: Diagnosis not present

## 2019-06-12 DIAGNOSIS — I96 Gangrene, not elsewhere classified: Secondary | ICD-10-CM | POA: Insufficient documentation

## 2019-06-12 DIAGNOSIS — E1122 Type 2 diabetes mellitus with diabetic chronic kidney disease: Secondary | ICD-10-CM | POA: Insufficient documentation

## 2019-06-12 DIAGNOSIS — Z79899 Other long term (current) drug therapy: Secondary | ICD-10-CM | POA: Insufficient documentation

## 2019-06-12 DIAGNOSIS — Z833 Family history of diabetes mellitus: Secondary | ICD-10-CM | POA: Insufficient documentation

## 2019-06-12 DIAGNOSIS — E1140 Type 2 diabetes mellitus with diabetic neuropathy, unspecified: Secondary | ICD-10-CM | POA: Insufficient documentation

## 2019-06-12 DIAGNOSIS — J449 Chronic obstructive pulmonary disease, unspecified: Secondary | ICD-10-CM | POA: Diagnosis not present

## 2019-06-12 HISTORY — PX: AMPUTATION: SHX166

## 2019-06-12 HISTORY — DX: Presence of dental prosthetic device (complete) (partial): Z97.2

## 2019-06-12 HISTORY — DX: Gangrene, not elsewhere classified: I96

## 2019-06-12 LAB — CBC
HCT: 31.5 % — ABNORMAL LOW (ref 39.0–52.0)
Hemoglobin: 10.2 g/dL — ABNORMAL LOW (ref 13.0–17.0)
MCH: 32.2 pg (ref 26.0–34.0)
MCHC: 32.4 g/dL (ref 30.0–36.0)
MCV: 99.4 fL (ref 80.0–100.0)
Platelets: 182 10*3/uL (ref 150–400)
RBC: 3.17 MIL/uL — ABNORMAL LOW (ref 4.22–5.81)
RDW: 18.4 % — ABNORMAL HIGH (ref 11.5–15.5)
WBC: 5.7 10*3/uL (ref 4.0–10.5)
nRBC: 0 % (ref 0.0–0.2)

## 2019-06-12 LAB — COMPREHENSIVE METABOLIC PANEL
ALT: 17 U/L (ref 0–44)
AST: 37 U/L (ref 15–41)
Albumin: 3.3 g/dL — ABNORMAL LOW (ref 3.5–5.0)
Alkaline Phosphatase: 75 U/L (ref 38–126)
Anion gap: 15 (ref 5–15)
BUN: 32 mg/dL — ABNORMAL HIGH (ref 8–23)
CO2: 21 mmol/L — ABNORMAL LOW (ref 22–32)
Calcium: 9.2 mg/dL (ref 8.9–10.3)
Chloride: 95 mmol/L — ABNORMAL LOW (ref 98–111)
Creatinine, Ser: 6.92 mg/dL — ABNORMAL HIGH (ref 0.61–1.24)
GFR calc Af Amer: 9 mL/min — ABNORMAL LOW (ref >60)
GFR calc non Af Amer: 7 mL/min — ABNORMAL LOW (ref >60)
Glucose, Bld: 120 mg/dL — ABNORMAL HIGH (ref 70–99)
Potassium: 4.1 mmol/L (ref 3.5–5.1)
Sodium: 131 mmol/L — ABNORMAL LOW (ref 135–145)
Total Bilirubin: 0.7 mg/dL (ref 0.3–1.2)
Total Protein: 7.4 g/dL (ref 6.5–8.1)

## 2019-06-12 LAB — GLUCOSE, CAPILLARY
Glucose-Capillary: 114 mg/dL — ABNORMAL HIGH (ref 70–99)
Glucose-Capillary: 95 mg/dL (ref 70–99)
Glucose-Capillary: 102 mg/dL — ABNORMAL HIGH (ref 70–99)

## 2019-06-12 LAB — SARS CORONAVIRUS 2 BY RT PCR (HOSPITAL ORDER, PERFORMED IN ~~LOC~~ HOSPITAL LAB): SARS Coronavirus 2: NEGATIVE

## 2019-06-12 SURGERY — AMPUTATION, FOOT, RAY
Anesthesia: General | Site: Finger | Laterality: Right

## 2019-06-12 MED ORDER — FENTANYL CITRATE (PF) 250 MCG/5ML IJ SOLN
INTRAMUSCULAR | Status: AC
  Filled 2019-06-12: qty 5

## 2019-06-12 MED ORDER — LABETALOL HCL 5 MG/ML IV SOLN
INTRAVENOUS | Status: DC | PRN
  Administered 2019-06-12: 2.5 mg via INTRAVENOUS

## 2019-06-12 MED ORDER — CHLORHEXIDINE GLUCONATE 4 % EX LIQD: 60.0000 mL | Freq: Once | CUTANEOUS | Status: DC

## 2019-06-12 MED ORDER — PROPOFOL 10 MG/ML IV BOLUS
INTRAVENOUS | Status: DC | PRN
  Administered 2019-06-12: 120 mg via INTRAVENOUS

## 2019-06-12 MED ORDER — FENTANYL CITRATE (PF) 250 MCG/5ML IJ SOLN
INTRAMUSCULAR | Status: DC | PRN
  Administered 2019-06-12 (×3): 25 ug via INTRAVENOUS
  Administered 2019-06-12: 50 ug via INTRAVENOUS

## 2019-06-12 MED ORDER — SODIUM CHLORIDE 0.9 % IV SOLN
INTRAVENOUS | Status: DC
  Administered 2019-06-12 (×2): via INTRAVENOUS

## 2019-06-12 MED ORDER — CEFAZOLIN SODIUM-DEXTROSE 2-4 GM/100ML-% IV SOLN
INTRAVENOUS | Status: AC
  Filled 2019-06-12: qty 100

## 2019-06-12 MED ORDER — MIDAZOLAM HCL 2 MG/2ML IJ SOLN
INTRAMUSCULAR | Status: AC
  Filled 2019-06-12: qty 2

## 2019-06-12 MED ORDER — BUPIVACAINE HCL (PF) 0.25 % IJ SOLN
INTRAMUSCULAR | Status: AC
  Filled 2019-06-12: qty 30

## 2019-06-12 MED ORDER — LIDOCAINE 2% (20 MG/ML) 5 ML SYRINGE
INTRAMUSCULAR | Status: DC | PRN
  Administered 2019-06-12: 100 mg via INTRAVENOUS

## 2019-06-12 MED ORDER — TRAMADOL HCL 50 MG PO TABS
50.0000 mg | ORAL_TABLET | Freq: Once | ORAL | Status: AC
  Administered 2019-06-12: 50 mg via ORAL

## 2019-06-12 MED ORDER — 0.9 % SODIUM CHLORIDE (POUR BTL) OPTIME
TOPICAL | Status: DC | PRN
  Administered 2019-06-12: 1000 mL

## 2019-06-12 MED ORDER — TRAMADOL HCL 50 MG PO TABS
ORAL_TABLET | ORAL | Status: AC
  Filled 2019-06-12: qty 1

## 2019-06-12 MED ORDER — CEFAZOLIN SODIUM-DEXTROSE 2-4 GM/100ML-% IV SOLN
2.0000 g | INTRAVENOUS | Status: AC
  Administered 2019-06-12: 2 g via INTRAVENOUS

## 2019-06-12 MED ORDER — AMLODIPINE BESYLATE 5 MG PO TABS
10.0000 mg | ORAL_TABLET | Freq: Once | ORAL | Status: AC
  Administered 2019-06-12: 10 mg via ORAL
  Filled 2019-06-12: qty 2

## 2019-06-12 MED ORDER — BUPIVACAINE HCL 0.25 % IJ SOLN
INTRAMUSCULAR | Status: DC | PRN
  Administered 2019-06-12: 10 mL

## 2019-06-12 MED ORDER — PROPOFOL 10 MG/ML IV BOLUS
INTRAVENOUS | Status: AC
  Filled 2019-06-12: qty 20

## 2019-06-12 MED ORDER — TRAMADOL HCL 50 MG PO TABS: ORAL_TABLET | ORAL | 0 refills | Status: DC

## 2019-06-12 MED ORDER — MIDAZOLAM HCL 5 MG/5ML IJ SOLN
INTRAMUSCULAR | Status: DC | PRN
  Administered 2019-06-12: 2 mg via INTRAVENOUS

## 2019-06-12 SURGICAL SUPPLY — 41 items
BLADE LONG MED 31MMX9MM (MISCELLANEOUS) ×1
BLADE LONG MED 31X9 (MISCELLANEOUS) ×2 IMPLANT
BNDG COHESIVE 1X5 TAN STRL LF (GAUZE/BANDAGES/DRESSINGS) ×3 IMPLANT
BNDG ELASTIC 2X5.8 VLCR STR LF (GAUZE/BANDAGES/DRESSINGS) ×2 IMPLANT
BNDG ESMARK 4X9 LF (GAUZE/BANDAGES/DRESSINGS) ×3 IMPLANT
BNDG GAUZE ELAST 4 BULKY (GAUZE/BANDAGES/DRESSINGS) ×2 IMPLANT
CONT SPEC 4OZ CLIKSEAL STRL BL (MISCELLANEOUS) ×2 IMPLANT
CORD BIPOLAR FORCEPS 12FT (ELECTRODE) ×3 IMPLANT
COVER WAND RF STERILE (DRAPES) ×3 IMPLANT
CUFF TOURN SGL QUICK 18X4 (TOURNIQUET CUFF) ×3 IMPLANT
CUFF TOURN SGL QUICK 24 (TOURNIQUET CUFF)
CUFF TRNQT CYL 24X4X16.5-23 (TOURNIQUET CUFF) IMPLANT
DECANTER SPIKE VIAL GLASS SM (MISCELLANEOUS) ×2 IMPLANT
DRSG PAD ABDOMINAL 8X10 ST (GAUZE/BANDAGES/DRESSINGS) ×2 IMPLANT
DURAPREP 26ML APPLICATOR (WOUND CARE) ×3 IMPLANT
GAUZE SPONGE 4X4 12PLY STRL (GAUZE/BANDAGES/DRESSINGS) ×2 IMPLANT
GAUZE XEROFORM 1X8 LF (GAUZE/BANDAGES/DRESSINGS) ×3 IMPLANT
GLOVE BIO SURGEON STRL SZ7.5 (GLOVE) ×3 IMPLANT
GLOVE BIOGEL PI IND STRL 8 (GLOVE) ×1 IMPLANT
GLOVE BIOGEL PI INDICATOR 8 (GLOVE) ×2
GOWN STRL REUS W/ TWL LRG LVL3 (GOWN DISPOSABLE) ×1 IMPLANT
GOWN STRL REUS W/ TWL XL LVL3 (GOWN DISPOSABLE) ×1 IMPLANT
GOWN STRL REUS W/TWL LRG LVL3 (GOWN DISPOSABLE) ×2
GOWN STRL REUS W/TWL XL LVL3 (GOWN DISPOSABLE) ×2
KIT BASIN OR (CUSTOM PROCEDURE TRAY) ×3 IMPLANT
KIT TURNOVER KIT B (KITS) ×3 IMPLANT
NDL HYPO 25GX1X1/2 BEV (NEEDLE) IMPLANT
NEEDLE HYPO 25GX1X1/2 BEV (NEEDLE) ×3 IMPLANT
NS IRRIG 1000ML POUR BTL (IV SOLUTION) ×3 IMPLANT
PACK ORTHO EXTREMITY (CUSTOM PROCEDURE TRAY) ×3 IMPLANT
PAD ARMBOARD 7.5X6 YLW CONV (MISCELLANEOUS) ×6 IMPLANT
PAD CAST 4YDX4 CTTN HI CHSV (CAST SUPPLIES) IMPLANT
PADDING CAST COTTON 4X4 STRL (CAST SUPPLIES)
SPECIMEN JAR SMALL (MISCELLANEOUS) ×3 IMPLANT
SUT CHROMIC 6 0 PS 4 (SUTURE) IMPLANT
SUT MON AB 5-0 P3 18 (SUTURE) ×2 IMPLANT
SUT MON AB 5-0 PS2 18 (SUTURE) ×6 IMPLANT
SUT VICRYL 4-0 PS2 18IN ABS (SUTURE) ×4 IMPLANT
SYR CONTROL 10ML LL (SYRINGE) ×2 IMPLANT
TOWEL GREEN STERILE (TOWEL DISPOSABLE) ×3 IMPLANT
UNDERPAD 30X30 (UNDERPADS AND DIAPERS) ×3 IMPLANT

## 2019-06-12 NOTE — Progress Notes (Signed)
Dr. Glennon Mac notified of patient's elevated BP, verbal orders received for 10mg  amlodipine.

## 2019-06-12 NOTE — H&P (Signed)
Garrett Salas is an 68 y.o. male.   Chief Complaint: right index necrosis HPI: 68 yo male with necrosis of radial side proximal phalanx right index finger and ischemia of distal tip.  He wishes to proceed with ray resection of right index finger.    Allergies: No Known Allergies  Past Medical History:  Diagnosis Date  . COPD (chronic obstructive pulmonary disease) ([place])   . Diabetes mellitus    Type 2. Diagnosed in mid 25s. Currently insulin requiring  . Diabetic neuropathy ([place])   . Diabetic retinopathy    legally blind  . Dialysis patient ([place])   . ESRD (end stage renal disease) on dialysis ([place])    due to diabetic nephropathy. T/Th/Sat  . Gangrene ([place])    right index  . Hx of right BKA ([place])   . Hyperlipidemia   . Hypertension   . Peripheral vascular disease ([place])   . Tobacco abuse   . Wears dentures     Past Surgical History:  Procedure Laterality Date  . A/V FISTULAGRAM N/A 05/22/2019   Procedure: A/V FISTULAGRAM - Right Arm;  Surgeon: Waynetta Sandy, MD;  Location: [place] [facility];  Service: Cardiovascular;  Laterality: N/A;  . ABDOMINAL AORTAGRAM N/A 01/07/2015   Procedure: ABDOMINAL Maxcine Ham;  Surgeon: Conrad Woodville, MD;  Location: [facility] CATH [facility];  Service: Cardiovascular;  Laterality: N/A;  . AMPUTATION Right 01/09/2015   Procedure: AMPUTATION BELOW KNEE RIGHT LEG;  Surgeon: Conrad Blakesburg, MD;  Location: [place];  Service: Vascular;  Laterality: Right;  . AMPUTATION Left 05/16/2015   Procedure: LEFT BELOW THE KNEE AMPUTATION;  Surgeon: Conrad Kanarraville, MD;  Location: [place];  Service: Vascular;  Laterality: Left;  . AV FISTULA PLACEMENT    . CARDIAC CATHETERIZATION  05/06/2011   no significant CAD, EF 55-60%, LVEDP :15   . INSERTION OF DIALYSIS CATHETER N/A 05/24/2019   Procedure: INSERTION OF DIALYSIS CATHETER;  Surgeon: Waynetta Sandy, MD;  Location: [place];  Service: Vascular;  Laterality: N/A;  . LIGATION OF ARTERIOVENOUS  FISTULA Right 05/24/2019    Procedure: LIGATION OF ARTERIOVENOUS  FISTULA RIGHT ARM;  Surgeon: Waynetta Sandy, MD;  Location: [place];  Service: Vascular;  Laterality: Right;  . MULTIPLE TOOTH EXTRACTIONS    . none    . UPPER EXTREMITY ANGIOGRAPHY Right 05/22/2019   Procedure: Upper Extremity Angiography;  Surgeon: Waynetta Sandy, MD;  Location: [place] [facility];  Service: Cardiovascular;  Laterality: Right;  . UPPER EXTREMITY VENOGRAPHY Left 05/22/2019   Procedure: UPPER EXTREMITY VENOGRAPHY;  Surgeon: Waynetta Sandy, MD;  Location: [place] [facility];  Service: Cardiovascular;  Laterality: Left;    Family History: Family History  Problem Relation Age of Onset  . Diabetes Mother   . Heart disease Other   . Alcohol abuse Other   . Diabetes Sister   . Diabetes Brother     Social History:   reports that he has been smoking cigarettes. He has a 15.00 pack-year smoking history. He has never used smokeless tobacco. He reports current drug use. Drug: Marijuana. He reports that he does not drink alcohol.  Medications: Medications Prior to Admission  Medication Sig Dispense Refill  . albuterol (PROVENTIL HFA;VENTOLIN HFA) 108 (90 BASE) MCG/ACT inhaler Inhale 2 puffs into the lungs every 6 (six) hours as needed for wheezing or shortness of breath.    Marland Kitchen amLODipine (NORVASC) 10 MG tablet Take 1 tablet (10 mg total) by mouth daily. 30 tablet  0  . atorvastatin (LIPITOR) 40 MG tablet Take 40 mg by mouth daily.    Lorin Picket 1 GM 210 MG(Fe) tablet Take 420 mg by mouth 3 (three) times daily with meals.     . calcitRIOL (ROCALTROL) 0.5 MCG capsule Take 1 capsule (0.5 mcg total) by mouth Every Tuesday,Thursday,and Saturday with dialysis. 30 capsule   . calcium carbonate (TUMS - DOSED IN MG ELEMENTAL CALCIUM) 500 MG chewable tablet Chew 1-2 tablets by mouth 3 (three) times daily as needed for indigestion or heartburn.    . cloNIDine (CATAPRES) 0.1 MG tablet Take 0.1 mg by mouth 3 (three) times daily.     . diphenhydrAMINE (BENADRYL) 25 MG tablet Take 25 mg by mouth as needed for itching.    Marland Kitchen HYDROcodone-acetaminophen (NORCO) 5-325 MG tablet Take 1 tablet by mouth every 6 (six) hours as needed for moderate pain. 10 tablet 0  . insulin glargine (LANTUS) 100 UNIT/ML injection Inject 15 Units into the skin every evening.     . insulin regular (NOVOLIN R) 100 units/mL injection Inject 0-5 Units into the skin 3 (three) times daily before meals. Sliding Scale Insulin    . LYRICA 75 MG capsule Take 75 mg by mouth See admin instructions. Take 1 capsule (75 mg) by mouth scheduled in the morning & up to twice daily as needed for pain.    Marland Kitchen metoCLOPramide (REGLAN) 5 MG tablet Take 5 mg by mouth 3 (three) times daily before meals.     . Multiple Vitamin (DAILY VITE PO) Take 1 tablet by mouth Every Tuesday,Thursday,and Saturday with dialysis.    Marland Kitchen ACCU-CHEK AVIVA PLUS test strip     . ACCU-CHEK SOFTCLIX LANCETS lancets     . B-D UF III MINI PEN NEEDLES 31G X 5 MM MISC     . Blood Glucose Monitoring Suppl (ACCU-CHEK AVIVA PLUS) W/DEVICE KIT     . ULTICARE INSULIN SYRINGE 29G X 1/2" 0.5 ML MISC       Results for orders placed or performed during the hospital encounter of 06/12/19 (from the past 48 hour(s))  CBC     Status: Abnormal   Collection Time: 06/12/19 11:34 AM  Result Value Ref Range   WBC 5.7 4.0 - 10.5 K/uL   RBC 3.17 (L) 4.22 - 5.81 MIL/uL   Hemoglobin 10.2 (L) 13.0 - 17.0 g/dL   HCT 31.5 (L) 39.0 - 52.0 %   MCV 99.4 80.0 - 100.0 fL   MCH 32.2 26.0 - 34.0 pg   MCHC 32.4 30.0 - 36.0 g/dL   RDW 18.4 (H) 11.5 - 15.5 %   Platelets 182 150 - 400 K/uL   nRBC 0.0 0.0 - 0.2 %    Comment: Performed at [facility], [address], [place], [place] [address]  Glucose, capillary     Status: Abnormal   Collection Time: 06/12/19 11:37 AM  Result Value Ref Range   Glucose-Capillary 114 (H) 70 - 99 mg/dL    No results found.   A comprehensive review of systems was negative.  Blood  pressure (!) 220/83, pulse (!) 59, temperature 97.9 F (36.6 C), temperature source Oral, resp. rate 16, height 5' 4"  (1.626 m), weight 63.5 kg, SpO2 100 %.  General appearance: alert, cooperative and appears stated age Head: Normocephalic, without obvious abnormality, atraumatic Neck: supple, symmetrical, trachea midline Cardio: regular rate and rhythm, S1, S2 normal, no murmur, click, rub or gallop Resp: clear to auscultation bilaterally Extremities: Intact sensation all digits.  Ischemia of tip right index finger.  +epl/fpl/io.  Dry non healing wound radial side index finger proximal phalanx. Pulses: 2+ and symmetric Skin: Skin color, texture, turgor normal. No rashes or lesions Neurologic: Grossly normal Incision/Wound: as above  Assessment/Plan Right index finger necrosis.  He wishes to proceed with ray resection right index finger.  Non operative and operative treatment options have been discussed with the patient and patient wishes to proceed with operative treatment. Risks, benefits, and alternatives of surgery have been discussed and the patient agrees with the plan of care.   Leanora Cover 06/12/2019, 12:38 PM

## 2019-06-12 NOTE — Anesthesia Preprocedure Evaluation (Signed)
Anesthesia Evaluation  Patient identified by MRN, date of birth, ID band Patient awake    Reviewed: Allergy & Precautions, NPO status , Patient's Chart, lab work & pertinent test results  Airway Mallampati: I  TM Distance: >3 FB Neck ROM: Full    Dental   Pulmonary Current Smoker,    Pulmonary exam normal        Cardiovascular hypertension, Pt. on medications Normal cardiovascular exam     Neuro/Psych    GI/Hepatic   Endo/Other  diabetes, Type 2, Insulin Dependent  Renal/GU Dialysis and ESRFRenal disease     Musculoskeletal   Abdominal   Peds  Hematology   Anesthesia Other Findings   Reproductive/Obstetrics                             Anesthesia Physical Anesthesia Plan  ASA: III  Anesthesia Plan: General   Post-op Pain Management:    Induction: Intravenous  PONV Risk Score and Plan: 1 and Ondansetron and Treatment may vary due to age or medical condition  Airway Management Planned: LMA  Additional Equipment:   Intra-op Plan:   Post-operative Plan: Extubation in OR  Informed Consent: I have reviewed the patients History and Physical, chart, labs and discussed the procedure including the risks, benefits and alternatives for the proposed anesthesia with the patient or authorized representative who has indicated his/her understanding and acceptance.       Plan Discussed with: CRNA and Surgeon  Anesthesia Plan Comments:         Anesthesia Quick Evaluation

## 2019-06-12 NOTE — Discharge Instructions (Signed)

## 2019-06-12 NOTE — Op Note (Signed)
NAME: Cushing RECORD NO: [id number] DATE OF BIRTH: [birth year] FACILITY: [facility] LOCATION: [facility] OR PHYSICIAN: Tennis Must, MD   OPERATIVE REPORT   DATE OF PROCEDURE: 06/12/19    PREOPERATIVE DIAGNOSIS:   Right index finger necrosis   POSTOPERATIVE DIAGNOSIS:   Right index finger necrosis   PROCEDURE:   Right index finger ray resection   SURGEON:  Leanora Cover, M.D.   ASSISTANT: none   ANESTHESIA:  General   INTRAVENOUS FLUIDS:  Per anesthesia flow sheet.   ESTIMATED BLOOD LOSS:  Minimal.   COMPLICATIONS:  None.   SPECIMENS:   Right index finger to pathology for gross exam   TOURNIQUET TIME:    Total Tourniquet Time Documented: Upper Arm (Right) - 78 minutes Total: Upper Arm (Right) - 78 minutes    DISPOSITION:  Stable to PACU.   INDICATIONS: 68 year old male with  end-stage renal disease on dialysis with necrosis of right index finger.  He has had a shunt tied off on the right side.  He has a nonhealing wound of the right index finger and ischemia at the tip.  He wishes to undergo ray resection.  Risks, benefits and alternatives of surgery were discussed including the risks of blood loss, infection, damage to nerves, vessels, tendons, ligaments, bone for surgery, need for additional surgery, complications with wound healing, continued pain, stiffness.  He voiced understanding of these risks and elected to proceed.  OPERATIVE COURSE:  After being identified preoperatively by myself,  the patient and I agreed on the procedure and site of the procedure.  The surgical site was marked.  Surgical consent had been signed. He was given IV antibiotics as preoperative antibiotic prophylaxis. He was transferred to the operating room and placed on the operating table in supine position with the Right upper extremity on an arm board.  General anesthesia was induced by the anesthesiologist.  Right upper extremity was prepped and draped in normal sterile orthopedic fashion.   A surgical pause was performed between the surgeons, anesthesia, and operating room staff and all were in agreement as to the patient, procedure, and site of procedure.  Tourniquet at the proximal aspect of the forearm was inflated to 250 mmHg after exsanguination of the arm with an Esmarch bandage.    Incision was made on along the dorsum of the index finger metacarpal and around the index finger just distal to the webspace.  This is carried in subcutaneous tissues by spreading technique.  Bipolar electrocautery was used to obtain hemostasis.  There was some bleeding due to high blood pressure and a venous tourniquet.  The radial and ulnar neurovascular bundles were identified.  The digital nerves were placed under traction bipolar and allowed to retract.  The digital arteries were identified and tied with 4-0 Vicryl suture and then bipolar distal to the tie.  The soft tissues were freed up from around the index finger.  The finger was amputated through the MP joint.  This portion was sent to pathology for gross exam.  The metacarpal was then amputated proximally in a beveled fashion.  The metacarpal was then freed up with soft tissue attachments and removed.  Bipolar electrocautery was used to obtain hemostasis.  The digital arteries were again identified and tied off more proximally so that they were not red at the end of the wound.  The digital nerves were placed under traction again bipolar and allowed to retract into soft tissues.  The wound was copiously irrigated with sterile saline.  It was then closed with 5-0 Monocryl suture in a horizontal mattress fashion.  Good tension-free re-apposition was obtained.  The dogear proximally was removed.  The area was injected with quarter percent plain Marcaine to aid in postoperative analgesia.  The wound was then dressed with sterile Xeroform 4 x 4's and ABD and wrapped with Kerlix and Ace bandage.  The tourniquet was deflated at 78 minutes.  Fingertips were pink  with brisk capillary refill after deflation of tourniquet.  The operative  drapes were broken down.  The patient was awoken from anesthesia safely.  He was transferred back to the stretcher and taken to PACU in stable condition.  I will see him back in the office in 1 week for postoperative followup.  I will give him a prescription for Tramadol 50 mg 1-2 tabs PO q6 hours prn pain, dispense # 30.   Leanora Cover, MD Electronically signed, 06/12/19

## 2019-06-12 NOTE — Anesthesia Postprocedure Evaluation (Signed)
Anesthesia Post Note  Patient: Mildred  Procedure(s) Performed: AMPUTATION RAY RIGHT INDEX FINGER (Right Finger)     Patient location during evaluation: PACU Anesthesia Type: General Level of consciousness: awake and alert Pain management: pain level controlled Vital Signs Assessment: post-procedure vital signs reviewed and stable Respiratory status: spontaneous breathing, nonlabored ventilation, respiratory function stable and patient connected to nasal cannula oxygen Cardiovascular status: blood pressure returned to baseline and stable Postop Assessment: no apparent nausea or vomiting Anesthetic complications: no    Last Vitals:  Vitals:   06/12/19 1309 06/12/19 1610  BP: (!) 207/75 (!) 173/72  Pulse:  (!) 54  Resp:  (!) 9  Temp:  36.5 C  SpO2:  100%    Last Pain:  Vitals:   06/12/19 1610  TempSrc:   PainSc: Chill[place] DAVID

## 2019-06-12 NOTE — Anesthesia Procedure Notes (Signed)
Procedure Name: LMA Insertion Date/Time: 06/12/2019 2:19 PM Performed by: Jearld Pies, CRNA Pre-anesthesia Checklist: Patient identified, Emergency Drugs available, Suction available and Patient being monitored Patient Re-evaluated:Patient Re-evaluated prior to induction Oxygen Delivery Method: Circle System Utilized Preoxygenation: Pre-oxygenation with 100% oxygen Induction Type: IV induction Ventilation: Mask ventilation without difficulty LMA: LMA inserted LMA Size: 4.0 Number of attempts: 1 Airway Equipment and Method: Bite block Placement Confirmation: positive ETCO2 Tube secured with: Tape Dental Injury: Teeth and Oropharynx as per pre-operative assessment

## 2019-06-12 NOTE — Transfer of Care (Signed)
Immediate Anesthesia Transfer of Care Note  Patient: DIONE MCCOMBIE  Procedure(s) Performed: AMPUTATION RAY RIGHT INDEX FINGER (Right Finger)  Patient Location: PACU  Anesthesia Type:General  Level of Consciousness: sedated and drowsy  Airway & Oxygen Therapy: Patient Spontanous Breathing and Patient connected to face mask oxygen  Post-op Assessment: Report given to RN and Post -op Vital signs reviewed and stable  Post vital signs: Reviewed and stable  Last Vitals:  Vitals Value Taken Time  BP 173/72 06/12/19 1610  Temp    Pulse 56 06/12/19 1613  Resp 8 06/12/19 1613  SpO2 100 % 06/12/19 1613  Vitals shown include unvalidated device data.  Last Pain:  Vitals:   06/12/19 1610  TempSrc:   PainSc: (P) Asleep         Complications: No apparent anesthesia complications

## 2019-06-12 NOTE — Progress Notes (Signed)
Verbal consent given by patient,  Patient unable to sign due to dominant hand to to be operated on. Two  RN's witnessed.

## 2019-06-13 ENCOUNTER — Encounter (HOSPITAL_COMMUNITY): Payer: Self-pay | Admitting: Orthopedic Surgery

## 2019-06-21 ENCOUNTER — Other Ambulatory Visit: Payer: Self-pay | Admitting: Orthopedic Surgery

## 2019-06-22 ENCOUNTER — Other Ambulatory Visit: Payer: Self-pay | Admitting: Orthopedic Surgery

## 2019-06-22 ENCOUNTER — Encounter (HOSPITAL_COMMUNITY): Payer: Self-pay | Admitting: *Deleted

## 2019-06-22 ENCOUNTER — Other Ambulatory Visit: Payer: Self-pay

## 2019-06-22 NOTE — Progress Notes (Signed)
Mr Plotts denies chest pain or shortness of breath. Patient denies that he nor anyone in his home has had any s/s of covid. Mr Deloatch said that he cannot go to [facility] for [employer], "my wife cannot drive all over [place] and I am blind, I don't want to hear any foolish mesh from any nurses that say I have to go there early for my test, it's like they think I'm stupid." I told patient that he may come in 3 hours early for surgery, yes it is preferred that they go to [facility], but if they can not find it it will be done here, just make sure you are 3 hours early- 0800. Mr. Arthurs is a Type II diabetic, I instructed him to take 7 units of Insulin on Sunday pm.  I instructed patient to check CBG after awaking and every 2 hours until arrival  to the hospital.  I Instructed patient if CBG is less than 70 to take 4 Glucose Tablets. Recheck CBG in 15 minutes then call pre- op desk at [phone] for further instructions.  I instructed patient he may drink clear liquids until 3 hours prior to surgery- 0800. We went over what beverages are 'clear liquids'. Patient states that he drinks Gatorade; I instructed patient to drink G2.

## 2019-06-26 ENCOUNTER — Ambulatory Visit (HOSPITAL_COMMUNITY): Payer: Medicare Other | Admitting: Certified Registered Nurse Anesthetist

## 2019-06-26 ENCOUNTER — Encounter (HOSPITAL_COMMUNITY): Payer: Self-pay

## 2019-06-26 ENCOUNTER — Encounter (HOSPITAL_COMMUNITY)
Admission: RE | Disposition: A | Discharge status: Home / Self Care | Payer: Self-pay | Source: Home / Self Care | Attending: Orthopedic Surgery

## 2019-06-26 ENCOUNTER — Other Ambulatory Visit: Payer: Self-pay

## 2019-06-26 ENCOUNTER — Ambulatory Visit (HOSPITAL_COMMUNITY)
Admission: RE | Admit: 2019-06-26 | Discharge: 2019-06-26 | Disposition: A | Discharge status: Home / Self Care | Payer: Medicare Other | Attending: Orthopedic Surgery | Admitting: Orthopedic Surgery

## 2019-06-26 DIAGNOSIS — F17210 Nicotine dependence, cigarettes, uncomplicated: Secondary | ICD-10-CM | POA: Diagnosis not present

## 2019-06-26 DIAGNOSIS — Z89511 Acquired absence of right leg below knee: Secondary | ICD-10-CM | POA: Diagnosis not present

## 2019-06-26 DIAGNOSIS — E1122 Type 2 diabetes mellitus with diabetic chronic kidney disease: Secondary | ICD-10-CM | POA: Diagnosis not present

## 2019-06-26 DIAGNOSIS — I998 Other disorder of circulatory system: Secondary | ICD-10-CM | POA: Diagnosis present

## 2019-06-26 DIAGNOSIS — J449 Chronic obstructive pulmonary disease, unspecified: Secondary | ICD-10-CM | POA: Insufficient documentation

## 2019-06-26 DIAGNOSIS — E11319 Type 2 diabetes mellitus with unspecified diabetic retinopathy without macular edema: Secondary | ICD-10-CM | POA: Insufficient documentation

## 2019-06-26 DIAGNOSIS — E1140 Type 2 diabetes mellitus with diabetic neuropathy, unspecified: Secondary | ICD-10-CM | POA: Diagnosis not present

## 2019-06-26 DIAGNOSIS — N186 End stage renal disease: Secondary | ICD-10-CM | POA: Insufficient documentation

## 2019-06-26 DIAGNOSIS — E785 Hyperlipidemia, unspecified: Secondary | ICD-10-CM | POA: Insufficient documentation

## 2019-06-26 DIAGNOSIS — Z794 Long term (current) use of insulin: Secondary | ICD-10-CM | POA: Diagnosis not present

## 2019-06-26 DIAGNOSIS — Z992 Dependence on renal dialysis: Secondary | ICD-10-CM | POA: Diagnosis not present

## 2019-06-26 DIAGNOSIS — E1152 Type 2 diabetes mellitus with diabetic peripheral angiopathy with gangrene: Secondary | ICD-10-CM | POA: Insufficient documentation

## 2019-06-26 DIAGNOSIS — I120 Hypertensive chronic kidney disease with stage 5 chronic kidney disease or end stage renal disease: Secondary | ICD-10-CM | POA: Diagnosis not present

## 2019-06-26 DIAGNOSIS — Z79899 Other long term (current) drug therapy: Secondary | ICD-10-CM | POA: Insufficient documentation

## 2019-06-26 DIAGNOSIS — Z20828 Contact with and (suspected) exposure to other viral communicable diseases: Secondary | ICD-10-CM | POA: Diagnosis not present

## 2019-06-26 DIAGNOSIS — Z89512 Acquired absence of left leg below knee: Secondary | ICD-10-CM | POA: Diagnosis not present

## 2019-06-26 HISTORY — PX: AMPUTATION: SHX166

## 2019-06-26 HISTORY — DX: Personal history of other medical treatment: Z92.89

## 2019-06-26 LAB — POCT I-STAT 4, (NA,K, GLUC, HGB,HCT)
Glucose, Bld: 94 mg/dL (ref 70–99)
HCT: 33 % — ABNORMAL LOW (ref 39.0–52.0)
Hemoglobin: 11.2 g/dL — ABNORMAL LOW (ref 13.0–17.0)
Potassium: 4.8 mmol/L (ref 3.5–5.1)
Sodium: 131 mmol/L — ABNORMAL LOW (ref 135–145)

## 2019-06-26 LAB — GLUCOSE, CAPILLARY
Glucose-Capillary: 88 mg/dL (ref 70–99)
Glucose-Capillary: 118 mg/dL — ABNORMAL HIGH (ref 70–99)

## 2019-06-26 LAB — SARS CORONAVIRUS 2 BY RT PCR (HOSPITAL ORDER, PERFORMED IN ~~LOC~~ HOSPITAL LAB): SARS Coronavirus 2: NEGATIVE

## 2019-06-26 SURGERY — AMPUTATION DIGIT
Anesthesia: Choice | Laterality: Right

## 2019-06-26 SURGERY — AMPUTATION DIGIT
Anesthesia: General | Site: Finger | Laterality: Right

## 2019-06-26 MED ORDER — PROPOFOL 10 MG/ML IV BOLUS
INTRAVENOUS | Status: DC | PRN
  Administered 2019-06-26: 120 mg via INTRAVENOUS

## 2019-06-26 MED ORDER — EPHEDRINE SULFATE-NACL 50-0.9 MG/10ML-% IV SOSY
PREFILLED_SYRINGE | INTRAVENOUS | Status: DC | PRN
  Administered 2019-06-26 (×2): 10 mg via INTRAVENOUS

## 2019-06-26 MED ORDER — STERILE WATER FOR IRRIGATION IR SOLN
Status: DC | PRN
  Administered 2019-06-26: 1000 mL

## 2019-06-26 MED ORDER — ONDANSETRON HCL 4 MG/2ML IJ SOLN: 4.0000 mg | Freq: Once | INTRAMUSCULAR | Status: DC | PRN

## 2019-06-26 MED ORDER — 0.9 % SODIUM CHLORIDE (POUR BTL) OPTIME
TOPICAL | Status: DC | PRN
  Administered 2019-06-26: 1000 mL

## 2019-06-26 MED ORDER — HYDROCODONE-ACETAMINOPHEN 5-325 MG PO TABS: ORAL_TABLET | ORAL | 0 refills | Status: DC

## 2019-06-26 MED ORDER — MIDAZOLAM HCL 2 MG/2ML IJ SOLN
INTRAMUSCULAR | Status: AC
  Filled 2019-06-26: qty 2

## 2019-06-26 MED ORDER — HYDROMORPHONE HCL 1 MG/ML IJ SOLN: 0.2500 mg | INTRAMUSCULAR | Status: DC | PRN

## 2019-06-26 MED ORDER — FENTANYL CITRATE (PF) 250 MCG/5ML IJ SOLN
INTRAMUSCULAR | Status: DC | PRN
  Administered 2019-06-26: 50 ug via INTRAVENOUS

## 2019-06-26 MED ORDER — EPHEDRINE 5 MG/ML INJ
INTRAVENOUS | Status: AC
  Filled 2019-06-26: qty 10

## 2019-06-26 MED ORDER — LIDOCAINE 2% (20 MG/ML) 5 ML SYRINGE
INTRAMUSCULAR | Status: DC | PRN
  Administered 2019-06-26: 100 mg via INTRAVENOUS

## 2019-06-26 MED ORDER — ONDANSETRON HCL 4 MG/2ML IJ SOLN
INTRAMUSCULAR | Status: AC
  Filled 2019-06-26: qty 2

## 2019-06-26 MED ORDER — PHENYLEPHRINE 40 MCG/ML (10ML) SYRINGE FOR IV PUSH (FOR BLOOD PRESSURE SUPPORT)
PREFILLED_SYRINGE | INTRAVENOUS | Status: AC
  Filled 2019-06-26: qty 10

## 2019-06-26 MED ORDER — PHENYLEPHRINE 40 MCG/ML (10ML) SYRINGE FOR IV PUSH (FOR BLOOD PRESSURE SUPPORT)
PREFILLED_SYRINGE | INTRAVENOUS | Status: DC | PRN
  Administered 2019-06-26: 80 ug via INTRAVENOUS
  Administered 2019-06-26 (×2): 40 ug via INTRAVENOUS

## 2019-06-26 MED ORDER — CHLORHEXIDINE GLUCONATE 4 % EX LIQD: 60.0000 mL | Freq: Once | CUTANEOUS | Status: DC

## 2019-06-26 MED ORDER — MEPERIDINE HCL 25 MG/ML IJ SOLN: 6.2500 mg | INTRAMUSCULAR | Status: DC | PRN

## 2019-06-26 MED ORDER — LABETALOL HCL 5 MG/ML IV SOLN
5.0000 mg | Freq: Once | INTRAVENOUS | Status: AC
  Administered 2019-06-26: 5 mg via INTRAVENOUS
  Filled 2019-06-26: qty 4

## 2019-06-26 MED ORDER — SODIUM CHLORIDE 0.9 % IV SOLN
INTRAVENOUS | Status: DC | PRN
  Administered 2019-06-26: 09:00:00 via INTRAVENOUS

## 2019-06-26 MED ORDER — CLONIDINE HCL 0.2 MG PO TABS
0.1000 mg | ORAL_TABLET | Freq: Once | ORAL | Status: AC
  Administered 2019-06-26: 0.1 mg via ORAL
  Filled 2019-06-26: qty 1

## 2019-06-26 MED ORDER — SODIUM CHLORIDE 0.9 % IV SOLN
INTRAVENOUS | Status: DC
  Administered 2019-06-26: 09:00:00 via INTRAVENOUS

## 2019-06-26 MED ORDER — CEFAZOLIN SODIUM-DEXTROSE 2-4 GM/100ML-% IV SOLN
2.0000 g | INTRAVENOUS | Status: AC
  Administered 2019-06-26: 11:00:00 2 g via INTRAVENOUS
  Filled 2019-06-26: qty 100

## 2019-06-26 MED ORDER — FENTANYL CITRATE (PF) 250 MCG/5ML IJ SOLN
INTRAMUSCULAR | Status: AC
  Filled 2019-06-26: qty 5

## 2019-06-26 MED ORDER — ONDANSETRON HCL 4 MG/2ML IJ SOLN
INTRAMUSCULAR | Status: DC | PRN
  Administered 2019-06-26: 4 mg via INTRAVENOUS

## 2019-06-26 SURGICAL SUPPLY — 45 items
BLADE LONG MED 31MMX9MM (MISCELLANEOUS) ×1
BLADE LONG MED 31X9 (MISCELLANEOUS) ×2 IMPLANT
BNDG COHESIVE 1X5 TAN STRL LF (GAUZE/BANDAGES/DRESSINGS) ×3 IMPLANT
BNDG COHESIVE 2X5 TAN STRL LF (GAUZE/BANDAGES/DRESSINGS) ×3 IMPLANT
BNDG ESMARK 4X9 LF (GAUZE/BANDAGES/DRESSINGS) ×3 IMPLANT
BNDG GAUZE ELAST 4 BULKY (GAUZE/BANDAGES/DRESSINGS) ×3 IMPLANT
CONT SPEC 4OZ CLIKSEAL STRL BL (MISCELLANEOUS) ×3 IMPLANT
CORD BIPOLAR FORCEPS 12FT (ELECTRODE) ×3 IMPLANT
COVER WAND RF STERILE (DRAPES) ×3 IMPLANT
CUFF TOURN SGL QUICK 18X4 (TOURNIQUET CUFF) ×3 IMPLANT
CUFF TOURN SGL QUICK 24 (TOURNIQUET CUFF)
CUFF TRNQT CYL 24X4X16.5-23 (TOURNIQUET CUFF) IMPLANT
DRSG XEROFORM 1X8 (GAUZE/BANDAGES/DRESSINGS) ×3 IMPLANT
DURAPREP 26ML APPLICATOR (WOUND CARE) ×3 IMPLANT
GAUZE SPONGE 4X4 12PLY STRL (GAUZE/BANDAGES/DRESSINGS) IMPLANT
GAUZE SPONGE 4X4 12PLY STRL LF (GAUZE/BANDAGES/DRESSINGS) ×3 IMPLANT
GAUZE XEROFORM 1X8 LF (GAUZE/BANDAGES/DRESSINGS) ×3 IMPLANT
GLOVE BIO SURGEON STRL SZ7.5 (GLOVE) ×3 IMPLANT
GLOVE BIOGEL PI IND STRL 8 (GLOVE) ×1 IMPLANT
GLOVE BIOGEL PI INDICATOR 8 (GLOVE) ×2
GOWN STRL REUS W/ TWL LRG LVL3 (GOWN DISPOSABLE) ×1 IMPLANT
GOWN STRL REUS W/ TWL XL LVL3 (GOWN DISPOSABLE) ×1 IMPLANT
GOWN STRL REUS W/TWL LRG LVL3 (GOWN DISPOSABLE) ×2
GOWN STRL REUS W/TWL XL LVL3 (GOWN DISPOSABLE) ×2
KIT BASIN OR (CUSTOM PROCEDURE TRAY) ×3 IMPLANT
KIT TURNOVER KIT B (KITS) ×3 IMPLANT
NEEDLE HYPO 25GX1X1/2 BEV (NEEDLE) IMPLANT
NS IRRIG 1000ML POUR BTL (IV SOLUTION) ×3 IMPLANT
PACK ORTHO EXTREMITY (CUSTOM PROCEDURE TRAY) ×3 IMPLANT
PAD ARMBOARD 7.5X6 YLW CONV (MISCELLANEOUS) ×6 IMPLANT
PAD CAST 4YDX4 CTTN HI CHSV (CAST SUPPLIES) IMPLANT
PADDING CAST COTTON 4X4 STRL (CAST SUPPLIES)
SPECIMEN JAR SMALL (MISCELLANEOUS) ×3 IMPLANT
SPLINT FINGER (SOFTGOODS) ×3 IMPLANT
SUT CHROMIC 6 0 PS 4 (SUTURE) IMPLANT
SUT MON AB 5-0 P3 18 (SUTURE) ×3 IMPLANT
SUT MON AB 5-0 PS2 18 (SUTURE) ×3 IMPLANT
SUT VIC AB 4-0 P-3 18XBRD (SUTURE) ×1 IMPLANT
SUT VIC AB 4-0 P3 18 (SUTURE) ×2
SUT VICRYL 4-0 PS2 18IN ABS (SUTURE) IMPLANT
SYR CONTROL 10ML LL (SYRINGE) IMPLANT
TOWEL GREEN STERILE (TOWEL DISPOSABLE) ×3 IMPLANT
TUBE CONNECTING 12'X1/4 (SUCTIONS) ×1
TUBE CONNECTING 12X1/4 (SUCTIONS) ×2 IMPLANT
UNDERPAD 30X30 (UNDERPADS AND DIAPERS) ×3 IMPLANT

## 2019-06-26 NOTE — Anesthesia Procedure Notes (Signed)
Procedure Name: LMA Insertion Date/Time: 06/26/2019 10:55 AM Performed by: Harden Mo, CRNA Pre-anesthesia Checklist: Patient identified, Emergency Drugs available, Suction available and Patient being monitored Patient Re-evaluated:Patient Re-evaluated prior to induction Oxygen Delivery Method: Circle System Utilized Preoxygenation: Pre-oxygenation with 100% oxygen Induction Type: IV induction LMA: LMA inserted LMA Size: 4.0 Number of attempts: 1 Airway Equipment and Method: Bite block Placement Confirmation: positive ETCO2 Tube secured with: Tape Dental Injury: Teeth and Oropharynx as per pre-operative assessment

## 2019-06-26 NOTE — Discharge Instructions (Addendum)

## 2019-06-26 NOTE — Transfer of Care (Signed)
Immediate Anesthesia Transfer of Care Note  Patient: [place]  Procedure(s) Performed: RIGHT SMALL FINGER AMPUTATION (Right Finger)  Patient Location: PACU  Anesthesia Type:General  Level of Consciousness: awake and drowsy  Airway & Oxygen Therapy: Patient Spontanous Breathing  Post-op Assessment: Report given to RN and Post -op Vital signs reviewed and stable  Post vital signs: Reviewed and stable  Last Vitals:  Vitals Value Taken Time  BP    Temp    Pulse 71 06/26/19 1207  Resp 12 06/26/19 1207  SpO2 99 % 06/26/19 1207  Vitals shown include unvalidated device data.  Last Pain:  Vitals:   06/26/19 0900  PainSc: 8          Complications: No apparent anesthesia complications

## 2019-06-26 NOTE — Anesthesia Postprocedure Evaluation (Signed)
Anesthesia Post Note  Patient: Garrett Salas  Procedure(s) Performed: RIGHT SMALL FINGER AMPUTATION (Right Finger)     Patient location during evaluation: PACU Anesthesia Type: General Level of consciousness: awake and alert Pain management: pain level controlled Vital Signs Assessment: post-procedure vital signs reviewed and stable Respiratory status: spontaneous breathing, nonlabored ventilation, respiratory function stable and patient connected to nasal cannula oxygen Cardiovascular status: blood pressure returned to baseline and stable Postop Assessment: no apparent nausea or vomiting Anesthetic complications: no    Last Vitals:  Vitals:   06/26/19 1422 06/26/19 1452  BP: (!) 208/83 (!) 202/88  Pulse: 71 74  Resp: 10 15  Temp: (!) 36.2 C 36.7 C  SpO2: 96% 98%    Last Pain:  Vitals:   06/26/19 1422  PainSc: 0-No pain                 Andy Allende DAVID

## 2019-06-26 NOTE — H&P (Signed)
Garrett Salas is an 68 y.o. male.   Chief Complaint: right small finger necrosis/ischemia HPI: 68 yo male with ESRD and ischemia in right hand.  Has had right index finger ray resection.  Now with ischemia and distal necrosis of right small finger.  He wishes to proceed with amputation right small finger.  Allergies: No Known Allergies  Past Medical History:  Diagnosis Date  . COPD (chronic obstructive pulmonary disease) ([place])   . Diabetes mellitus    Type 2. Diagnosed in mid 57s. Currently insulin requiring  . Diabetic neuropathy ([place])   . Diabetic retinopathy    legally blind  . Dialysis patient ([place])   . ESRD (end stage renal disease) on dialysis ([place])    due to diabetic nephropathy. T/Th/Sat  . Gangrene ([place])    right index  . History of blood transfusion   . Hx of right BKA ([place])   . Hyperlipidemia   . Hypertension   . Peripheral vascular disease ([place])   . Tobacco abuse   . Wears dentures     Past Surgical History:  Procedure Laterality Date  . A/V FISTULAGRAM N/A 05/22/2019   Procedure: A/V FISTULAGRAM - Right Arm;  Surgeon: Waynetta Sandy, MD;  Location: [facility] [facility];  Service: Cardiovascular;  Laterality: N/A;  . ABDOMINAL AORTAGRAM N/A 01/07/2015   Procedure: ABDOMINAL Maxcine Ham;  Surgeon: Conrad [place], MD;  Location: [facility];  Service: Cardiovascular;  Laterality: N/A;  . AMPUTATION Right 01/09/2015   Procedure: AMPUTATION BELOW KNEE RIGHT LEG;  Surgeon: Conrad [place], MD;  Location: [place];  Service: Vascular;  Laterality: Right;  . AMPUTATION Left 05/16/2015   Procedure: LEFT BELOW THE KNEE AMPUTATION;  Surgeon: Conrad , MD;  Location: [place];  Service: Vascular;  Laterality: Left;  . AMPUTATION Right 06/12/2019   Procedure: AMPUTATION RAY RIGHT INDEX FINGER;  Surgeon: Leanora Cover, MD;  Location: [place];  Service: Orthopedics;  Laterality: Right;  . AV FISTULA PLACEMENT    . CARDIAC CATHETERIZATION  05/06/2011   no significant CAD, EF 55-60%,  LVEDP :15   . INSERTION OF DIALYSIS CATHETER N/A 05/24/2019   Procedure: INSERTION OF DIALYSIS CATHETER;  Surgeon: Waynetta Sandy, MD;  Location: [place];  Service: Vascular;  Laterality: N/A;  . LIGATION OF ARTERIOVENOUS  FISTULA Right 05/24/2019   Procedure: LIGATION OF ARTERIOVENOUS  FISTULA RIGHT ARM;  Surgeon: Waynetta Sandy, MD;  Location: [place];  Service: Vascular;  Laterality: Right;  . MULTIPLE TOOTH EXTRACTIONS    . none    . UPPER EXTREMITY ANGIOGRAPHY Right 05/22/2019   Procedure: Upper Extremity Angiography;  Surgeon: Waynetta Sandy, MD;  Location: [place] [facility];  Service: Cardiovascular;  Laterality: Right;  . UPPER EXTREMITY VENOGRAPHY Left 05/22/2019   Procedure: UPPER EXTREMITY VENOGRAPHY;  Surgeon: Waynetta Sandy, MD;  Location: [place] [facility];  Service: Cardiovascular;  Laterality: Left;    Family History: Family History  Problem Relation Age of Onset  . Diabetes Mother   . Heart disease Other   . Alcohol abuse Other   . Diabetes Sister   . Diabetes Brother     Social History:   reports that he has been smoking cigarettes. He has a 2.25 pack-year smoking history. He has never used smokeless tobacco. He reports current drug use. Drug: Marijuana. He reports that he does not drink alcohol.  Medications: Medications Prior to Admission  Medication Sig Dispense Refill  . albuterol (PROVENTIL HFA;VENTOLIN HFA)  108 (90 BASE) MCG/ACT inhaler Inhale 2 puffs into the lungs every 6 (six) hours as needed for wheezing or shortness of breath.    Marland Kitchen amLODipine (NORVASC) 10 MG tablet Take 1 tablet (10 mg total) by mouth daily. 30 tablet 0  . atorvastatin (LIPITOR) 40 MG tablet Take 40 mg by mouth daily.    Lorin Picket 1 GM 210 MG(Fe) tablet Take 420 mg by mouth 3 (three) times daily with meals.     . cloNIDine (CATAPRES) 0.1 MG tablet Take 0.1 mg by mouth 3 (three) times daily.    Marland Kitchen ACCU-CHEK AVIVA PLUS test strip     . ACCU-CHEK SOFTCLIX  LANCETS lancets     . B-D UF III MINI PEN NEEDLES 31G X 5 MM MISC     . Blood Glucose Monitoring Suppl (ACCU-CHEK AVIVA PLUS) W/DEVICE KIT     . calcitRIOL (ROCALTROL) 0.5 MCG capsule Take 1 capsule (0.5 mcg total) by mouth Every Tuesday,Thursday,and Saturday with dialysis. 30 capsule   . calcium carbonate (TUMS - DOSED IN MG ELEMENTAL CALCIUM) 500 MG chewable tablet Chew 1-2 tablets by mouth 3 (three) times daily as needed for indigestion or heartburn.    . diphenhydrAMINE (BENADRYL) 25 MG tablet Take 25 mg by mouth as needed for itching.    Marland Kitchen HYDROcodone-acetaminophen (NORCO) 5-325 MG tablet Take 1 tablet by mouth every 6 (six) hours as needed for moderate pain. 10 tablet 0  . insulin glargine (LANTUS) 100 UNIT/ML injection Inject 15 Units into the skin every evening.     . insulin regular (NOVOLIN R) 100 units/mL injection Inject 0-5 Units into the skin 3 (three) times daily before meals. Sliding Scale Insulin    . LYRICA 75 MG capsule Take 75 mg by mouth See admin instructions. Take 1 capsule (75 mg) by mouth scheduled in the morning & up to twice daily as needed for pain.    Marland Kitchen metoCLOPramide (REGLAN) 5 MG tablet Take 5 mg by mouth 3 (three) times daily before meals.     . Multiple Vitamin (DAILY VITE PO) Take 1 tablet by mouth Every Tuesday,Thursday,and Saturday with dialysis.    Marland Kitchen traMADol (ULTRAM) 50 MG tablet 1-2 tabs PO q6 hours prn pain 30 tablet 0  . ULTICARE INSULIN SYRINGE 29G X 1/2" 0.5 ML MISC       Results for orders placed or performed during the hospital encounter of 06/26/19 (from the past 48 hour(s))  SARS Coronavirus 2 [facility] order, Performed in [facility] hospital lab) Nasopharyngeal Nasopharyngeal Swab     Status: None   Collection Time: 06/26/19  8:32 AM   Specimen: Nasopharyngeal Swab  Result Value Ref Range   SARS Coronavirus 2 NEGATIVE NEGATIVE    Comment: (NOTE) If result is NEGATIVE SARS-CoV-2 target nucleic acids are NOT DETECTED. The SARS-CoV-2 RNA is  generally detectable in upper and lower  respiratory specimens during the acute phase of infection. The lowest  concentration of SARS-CoV-2 viral copies this assay can detect is 250  copies / mL. A negative result does not preclude SARS-CoV-2 infection  and should not be used as the sole basis for treatment or other  patient management decisions.  A negative result may occur with  improper specimen collection / handling, submission of specimen other  than nasopharyngeal swab, presence of viral mutation(s) within the  areas targeted by this assay, and inadequate number of viral copies  (<250 copies / mL). A negative result must be combined with clinical  observations, patient history,  and epidemiological information. If result is POSITIVE SARS-CoV-2 target nucleic acids are DETECTED. The SARS-CoV-2 RNA is generally detectable in upper and lower  respiratory specimens dur ing the acute phase of infection.  Positive  results are indicative of active infection with SARS-CoV-2.  Clinical  correlation with patient history and other diagnostic information is  necessary to determine patient infection status.  Positive results do  not rule out bacterial infection or co-infection with other viruses. If result is PRESUMPTIVE POSTIVE SARS-CoV-2 nucleic acids MAY BE PRESENT.   A presumptive positive result was obtained on the submitted specimen  and confirmed on repeat testing.  While 2019 novel coronavirus  (SARS-CoV-2) nucleic acids may be present in the submitted sample  additional confirmatory testing may be necessary for epidemiological  and / or clinical management purposes  to differentiate between  SARS-CoV-2 and other Sarbecovirus currently known to infect humans.  If clinically indicated additional testing with an alternate test  methodology [phone]) is advised. The SARS-CoV-2 RNA is generally  detectable in upper and lower respiratory sp ecimens during the acute  phase of  infection. The expected result is Negative. Fact Sheet for Patients:  StrictlyIdeas.no Fact Sheet for Healthcare Providers: BankingDealers.co.za This test is not yet approved or cleared by the [place] FDA and has been authorized for detection and/or diagnosis of SARS-CoV-2 by FDA under an Emergency Use Authorization (EUA).  This EUA will remain in effect (meaning this test can be used) for the duration of the COVID-19 declaration under Section 564(b)(1) of the Act, 21 U.S.C. section 360bbb-3(b)(1), unless the authorization is terminated or revoked sooner. Performed at [facility], [place] [address], [place], [place] [address]   Glucose, capillary     Status: None   Collection Time: 06/26/19  8:51 AM  Result Value Ref Range   Glucose-Capillary 88 70 - 99 mg/dL  I-STAT 4, (NA,K, GLUC, HGB,HCT)     Status: Abnormal   Collection Time: 06/26/19  9:12 AM  Result Value Ref Range   Sodium 131 (L) 135 - 145 mmol/L   Potassium 4.8 3.5 - 5.1 mmol/L   Glucose, Bld 94 70 - 99 mg/dL   HCT 33.0 (L) 39.0 - 52.0 %   Hemoglobin 11.2 (L) 13.0 - 17.0 g/dL    No results found.   A comprehensive review of systems was negative.  Blood pressure (!) 255/78, pulse (!) 52, resp. rate 16, height 5' 4" (1.626 m), weight 63.5 kg, SpO2 100 %.  General appearance: alert, cooperative and appears stated age Head: Normocephalic, without obvious abnormality, atraumatic Neck: supple, symmetrical, trachea midline Cardio: regular rate and rhythm Resp: clear to auscultation bilaterally Extremities: Intact sensation all digits.  Poor capillary refill and turgor. +epl/fpl/io.  Right small finger with ischemia and distal necrosis with elevation of nail plate Pulses: 2+ and symmetric Skin: Skin color, texture, turgor normal. No rashes or lesions Neurologic: Grossly normal Incision/Wound: as above  Assessment/Plan Right small finger ischemia and distal  necrosis.  Non operative and operative treatment options have been discussed with the patient and patient wishes to proceed with operative treatment. Risks, benefits, and alternatives of surgery have been discussed and the patient agrees with the plan of care.   Leanora Cover 06/26/2019, 9:34 AM

## 2019-06-26 NOTE — Progress Notes (Addendum)
Dr. Conrad Felton notified about patient's elevated BP, verbal orders given, will continue to monitor.

## 2019-06-26 NOTE — Op Note (Addendum)
NAME: Garrett Salas RECORD NO: [id number] DATE OF BIRTH: Jul 17, 1951 FACILITY: [facility] LOCATION: [facility] OR PHYSICIAN: Tennis Must, MD   OPERATIVE REPORT   DATE OF PROCEDURE: 06/26/19    PREOPERATIVE DIAGNOSIS:   Right small finger ischemia and necrosis   POSTOPERATIVE DIAGNOSIS:   Right small finger ischemia and necrosis   PROCEDURE:   Right small finger amputation through MP joint   SURGEON:  Leanora Cover, M.D.   ASSISTANT: none   ANESTHESIA:  General   INTRAVENOUS FLUIDS:  Per anesthesia flow sheet.   ESTIMATED BLOOD LOSS:  Minimal.   COMPLICATIONS:  None.   SPECIMENS:   Right small finger to pathology for gross only   TOURNIQUET TIME:    Total Tourniquet Time Documented: Forearm (Right) - 45 minutes Total: Forearm (Right) - 45 minutes    DISPOSITION:  Stable to PACU.   INDICATIONS: 68 year old male with ischemia and necrosis of right small finger.  He wishes to undergo amputation for management of this issue. Risks, benefits and alternatives of surgery were discussed including the risks of blood loss, infection, damage to nerves, vessels, tendons, ligaments, bone for surgery, need for additional surgery, complications with wound healing, continued pain, stiffness.  He voiced understanding of these risks and elected to proceed.  OPERATIVE COURSE:  After being identified preoperatively by myself,  the patient and I agreed on the procedure and site of the procedure.  The surgical site was marked.  Surgical consent had been signed. He was given IV antibiotics as preoperative antibiotic prophylaxis. He was transferred to the operating room and placed on the operating table in supine position with the Right upper extremity on an arm board.  General anesthesia was induced by the anesthesiologist.  Right upper extremity was prepped and draped in normal sterile orthopedic fashion.  A surgical pause was performed between the surgeons, anesthesia, and operating room staff  and all were in agreement as to the patient, procedure, and site of procedure.  Tourniquet at the proximal aspect of the forearm was inflated to 275 mmHg after exsanguination of the arm with an Esmarch bandage.    This was later augmented with a Esmarch tourniquet at the wrist due to continued bleeding.  Incision was made around the base of the index finger leaving a volar skin flap.  This was carried in subcutaneous tissues by spreading technique.  Bipolar electrocautery was used to obtain hemostasis.  The radial and ulnar digital neurovascular bundles were identified.  The digital nerves were placed under traction bipolar and allowed to retract into the soft tissues.  The digital arteries were tied with a Vicryl suture and then bipolared.  This was later tied more proximally once the appropriate skin flap and been made.  The finger soft tissues were then released.  The digit was amputated through the MP joint.  And bipolar electrocautery was used to obtain hemostasis.  The digital arteries were resutured more proximally as appropriate for the skin flap and then bipolared and resected.  The wound was copiously irrigated with sterile saline.  Was then closed with 5-0 Monocryl suture in an interrupted fashion.  Good reapproximation without tension was obtained.  Perceptible contour was obtained.  The wound was dressed with sterile Xeroform 4 x 4's and wrapped with a Kerlix and a Coban dressing lightly.  The tourniquet was deflated at 45 minutes.  Fingertips were pink with brisk capillary refill after deflation of tourniquet.  The operative  drapes were broken down.  The patient  was awoken from anesthesia safely.  He was transferred back to the stretcher and taken to PACU in stable condition.  I will see him back in the office in 1 week for postoperative followup.  I will give him a prescription for Norco 5/325 1-2 tabs PO q6 hours prn pain, dispense # 30.   Leanora Cover, MD Electronically signed, 06/26/19

## 2019-06-26 NOTE — Anesthesia Preprocedure Evaluation (Addendum)
Anesthesia Evaluation  Patient identified by MRN, date of birth, ID band Patient awake    Reviewed: Allergy & Precautions, NPO status , Patient's Chart, lab work & pertinent test results  Airway Mallampati: I  TM Distance: >3 FB Neck ROM: Full    Dental  (+) Edentulous Upper, Edentulous Lower, Dental Advisory Given   Pulmonary COPD, Current Smoker and Patient abstained from smoking.,    Pulmonary exam normal        Cardiovascular hypertension, Pt. on medications Normal cardiovascular exam     Neuro/Psych    GI/Hepatic   Endo/Other  diabetes, Type 2, Insulin Dependent  Renal/GU ESRF and DialysisRenal disease     Musculoskeletal   Abdominal   Peds  Hematology   Anesthesia Other Findings   Reproductive/Obstetrics                            Anesthesia Physical Anesthesia Plan  ASA: III  Anesthesia Plan: General   Post-op Pain Management:    Induction: Intravenous  PONV Risk Score and Plan: 1 and Treatment may vary due to age or medical condition  Airway Management Planned: LMA  Additional Equipment:   Intra-op Plan:   Post-operative Plan: Extubation in OR  Informed Consent: I have reviewed the patients History and Physical, chart, labs and discussed the procedure including the risks, benefits and alternatives for the proposed anesthesia with the patient or authorized representative who has indicated his/her understanding and acceptance.       Plan Discussed with: CRNA and Surgeon  Anesthesia Plan Comments:         Anesthesia Quick Evaluation

## 2019-06-27 ENCOUNTER — Encounter (HOSPITAL_COMMUNITY): Payer: Self-pay | Admitting: Orthopedic Surgery

## 2020-07-22 IMAGING — CT CT HEAD W/O CM
1 series · 16 of 30 positions shown, 20 images · non-contrast
Comparison: 08/18/2018 head CT

CLINICAL DATA: Altered mental status

EXAM:
CT HEAD WITHOUT CONTRAST
TECHNIQUE: Contiguous axial images were obtained from the base of the skull
through the vertex without intravenous contrast.

[Series 3: head 5.0 h30s · axial · 0.46mm/px · z∈[-174,-29]mm · 16 of 33 slices shown, 20 images]
[im 2/33  brain]
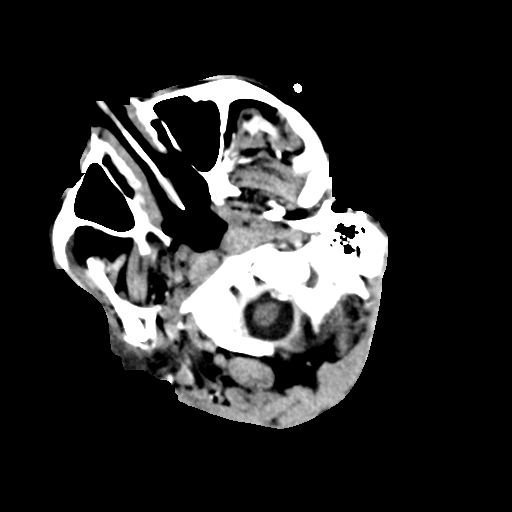
[im 2/33  bone]
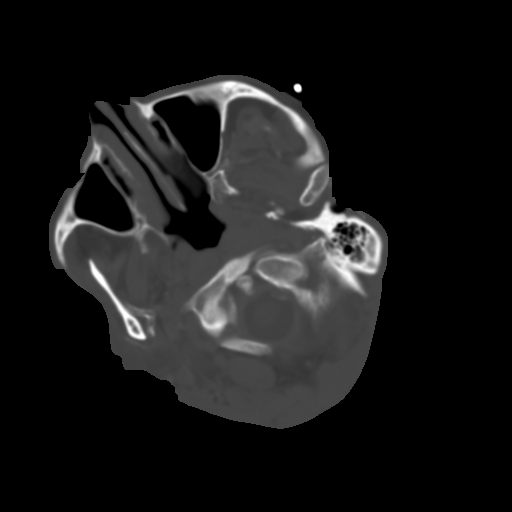
[im 4/33  brain]
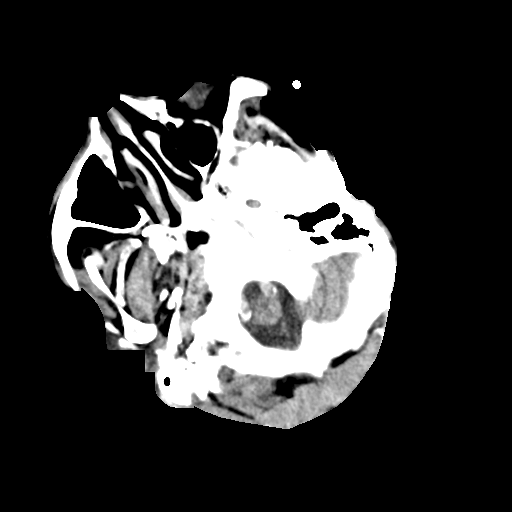
[im 6/33  brain]
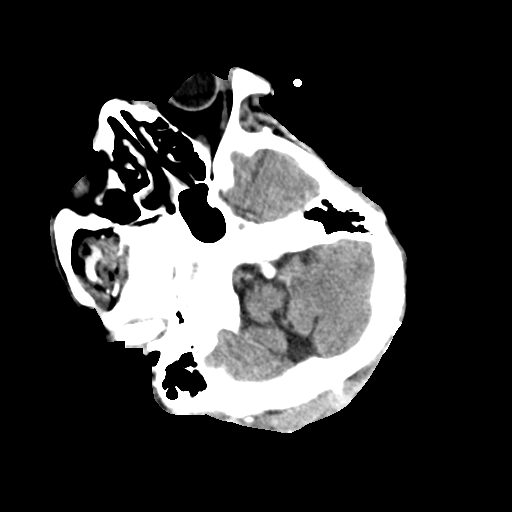
[im 8/33  brain]
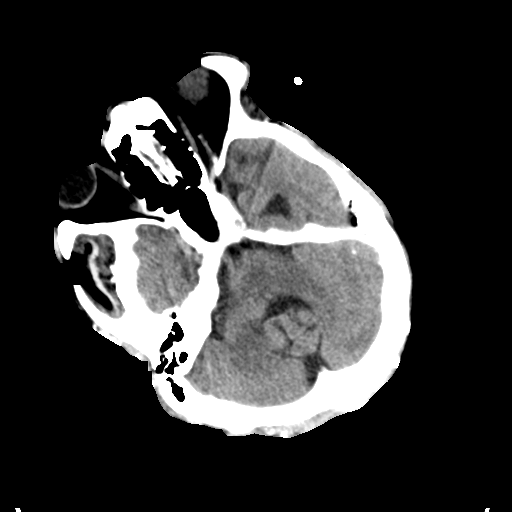
[im 9/33  brain]
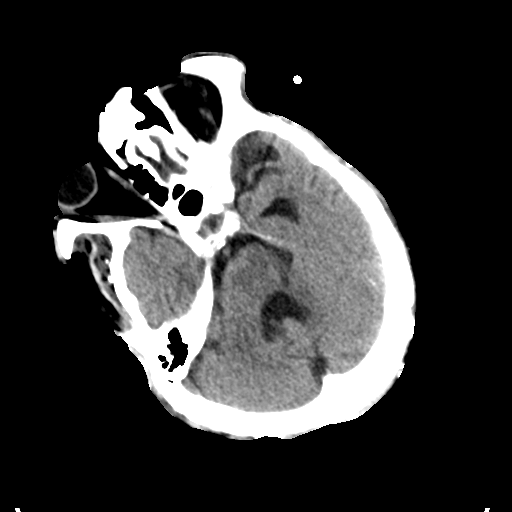
[im 9/33  bone]
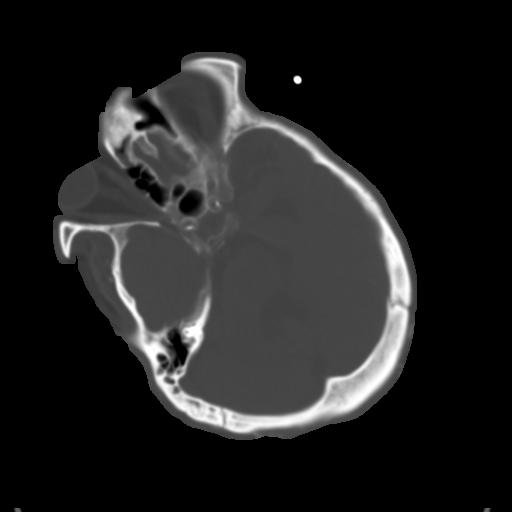
[im 12/33  brain]
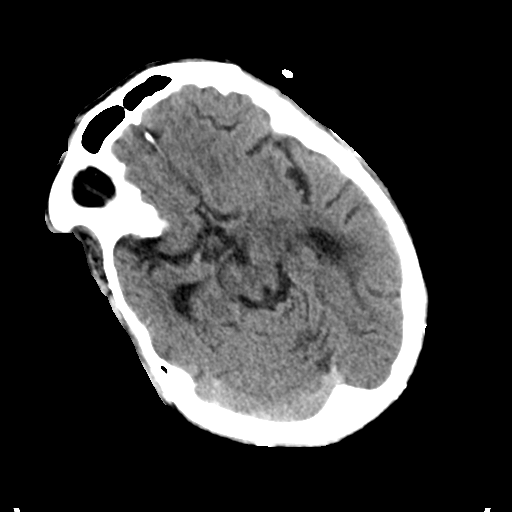
[im 14/33  brain]
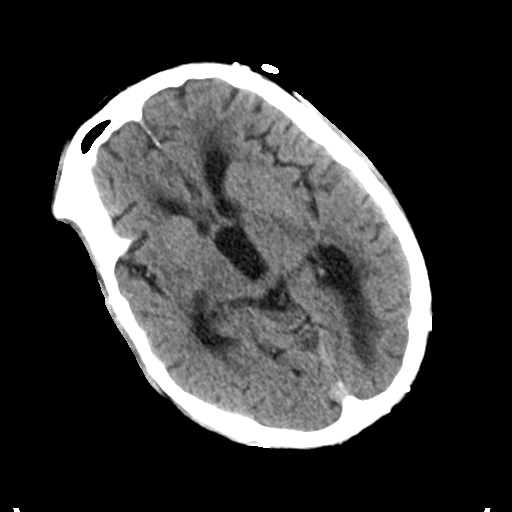
[im 16/33  brain]
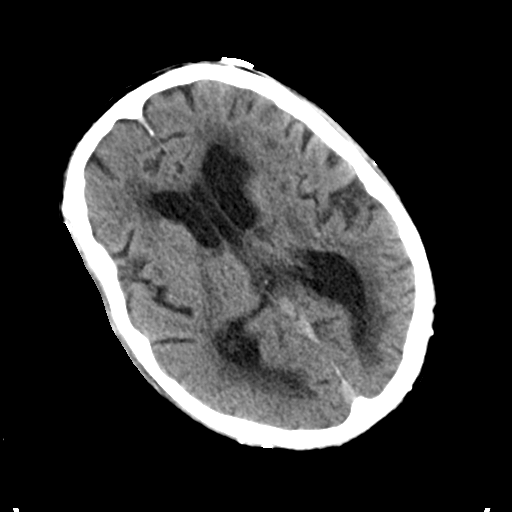
[im 17/33  brain]
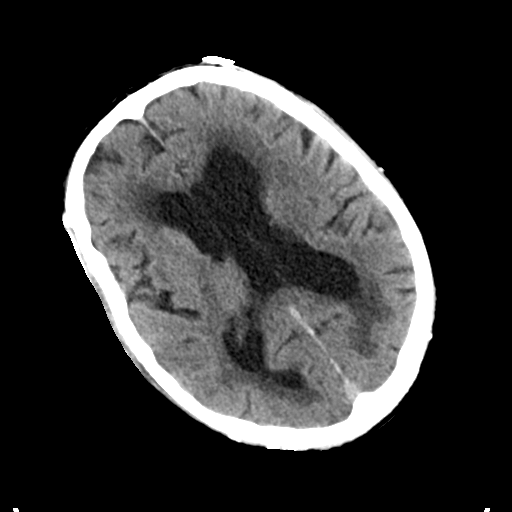
[im 17/33  bone]
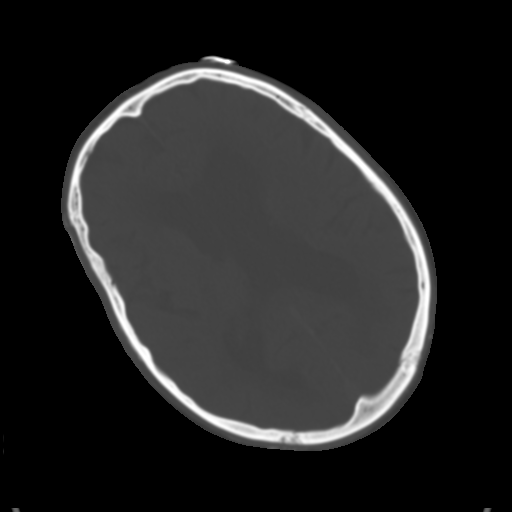
[im 19/33  brain]
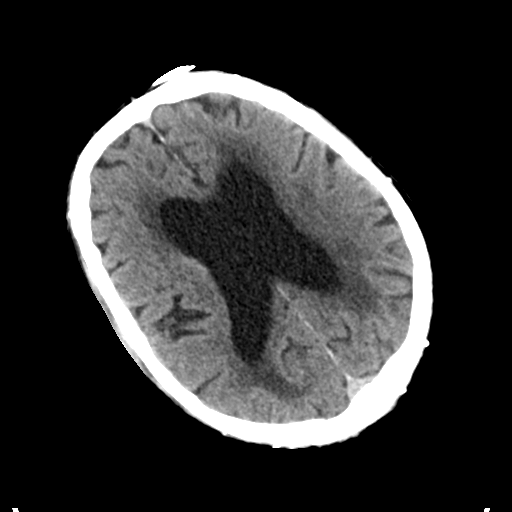
[im 21/33  brain]
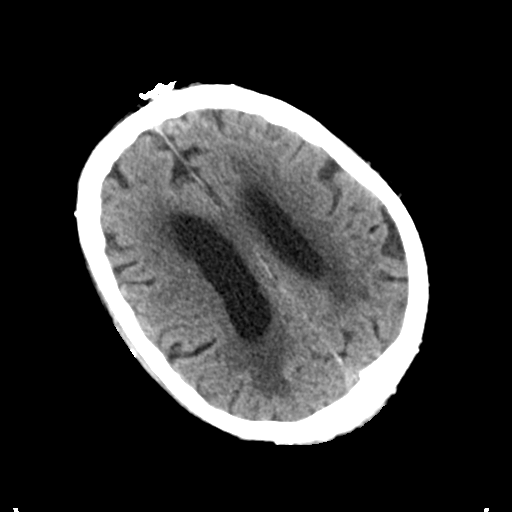
[im 24/33  brain]
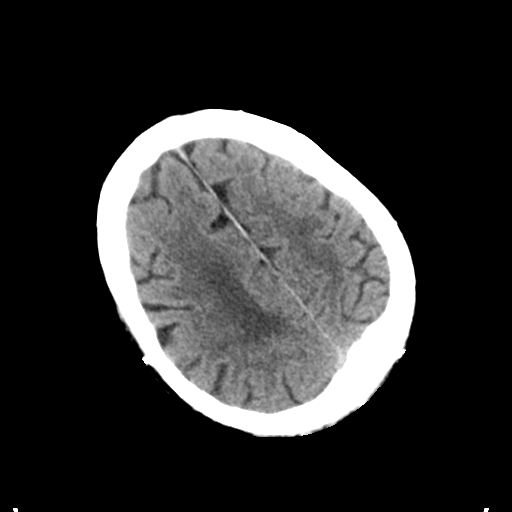
[im 25/33  brain]
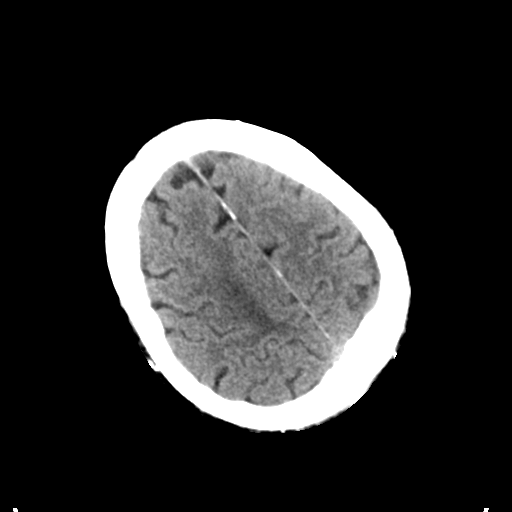
[im 25/33  bone]
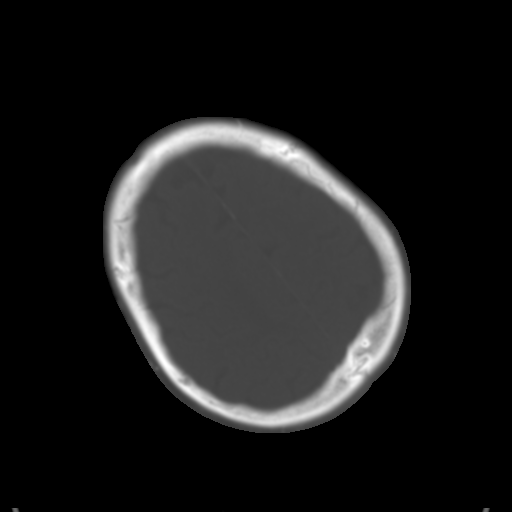
[im 27/33  brain]
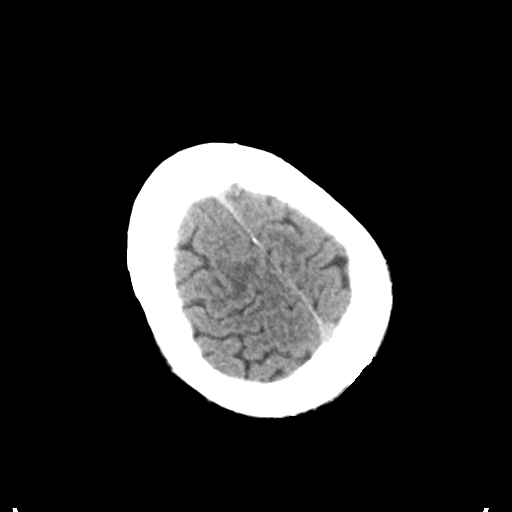
[im 29/33  brain]
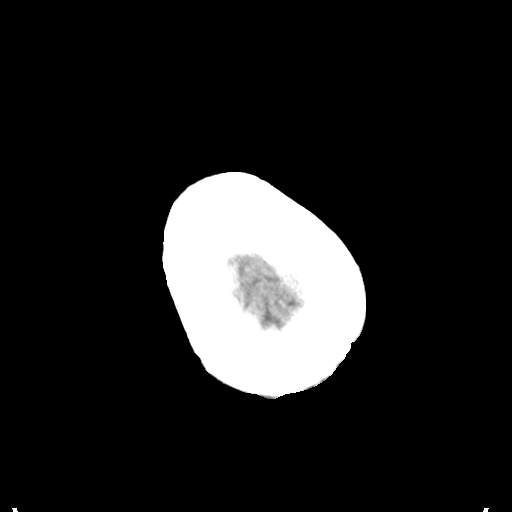
[im 31/33  brain]
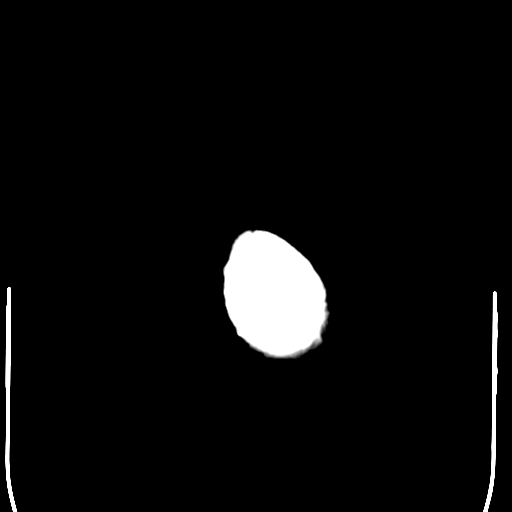

[16 of 30 positions shown; findings below may reference images not displayed]

FINDINGS: Brain: There is no mass, hemorrhage or extra-axial collection. There
is generalized atrophy without lobar predilection. There is no acute
or chronic infarction. There is hypoattenuation of the
periventricular white matter, most commonly indicating chronic
ischemic microangiopathy.

Vascular: Atherosclerotic calcification of the internal carotid
arteries at the skull base. No abnormal hyperdensity of the major
intracranial arteries or dural venous sinuses.

Skull: The visualized skull base, calvarium and extracranial soft
tissues are normal.

Sinuses/Orbits: No fluid levels or advanced mucosal thickening of
the visualized paranasal sinuses. No mastoid or middle ear effusion.
The orbits are normal.
IMPRESSION: Chronic small vessel disease and generalized atrophy without acute
intracranial abnormality.

## 2020-07-22 IMAGING — DX DG CHEST 1V PORT
1 series · 1 of 1 positions shown · non-contrast
Comparison: 08/18/2018

CLINICAL DATA: Altered mental status. Cough and cold symptoms for 2
weeks.

EXAM:
PORTABLE CHEST 1 VIEW

[chest]
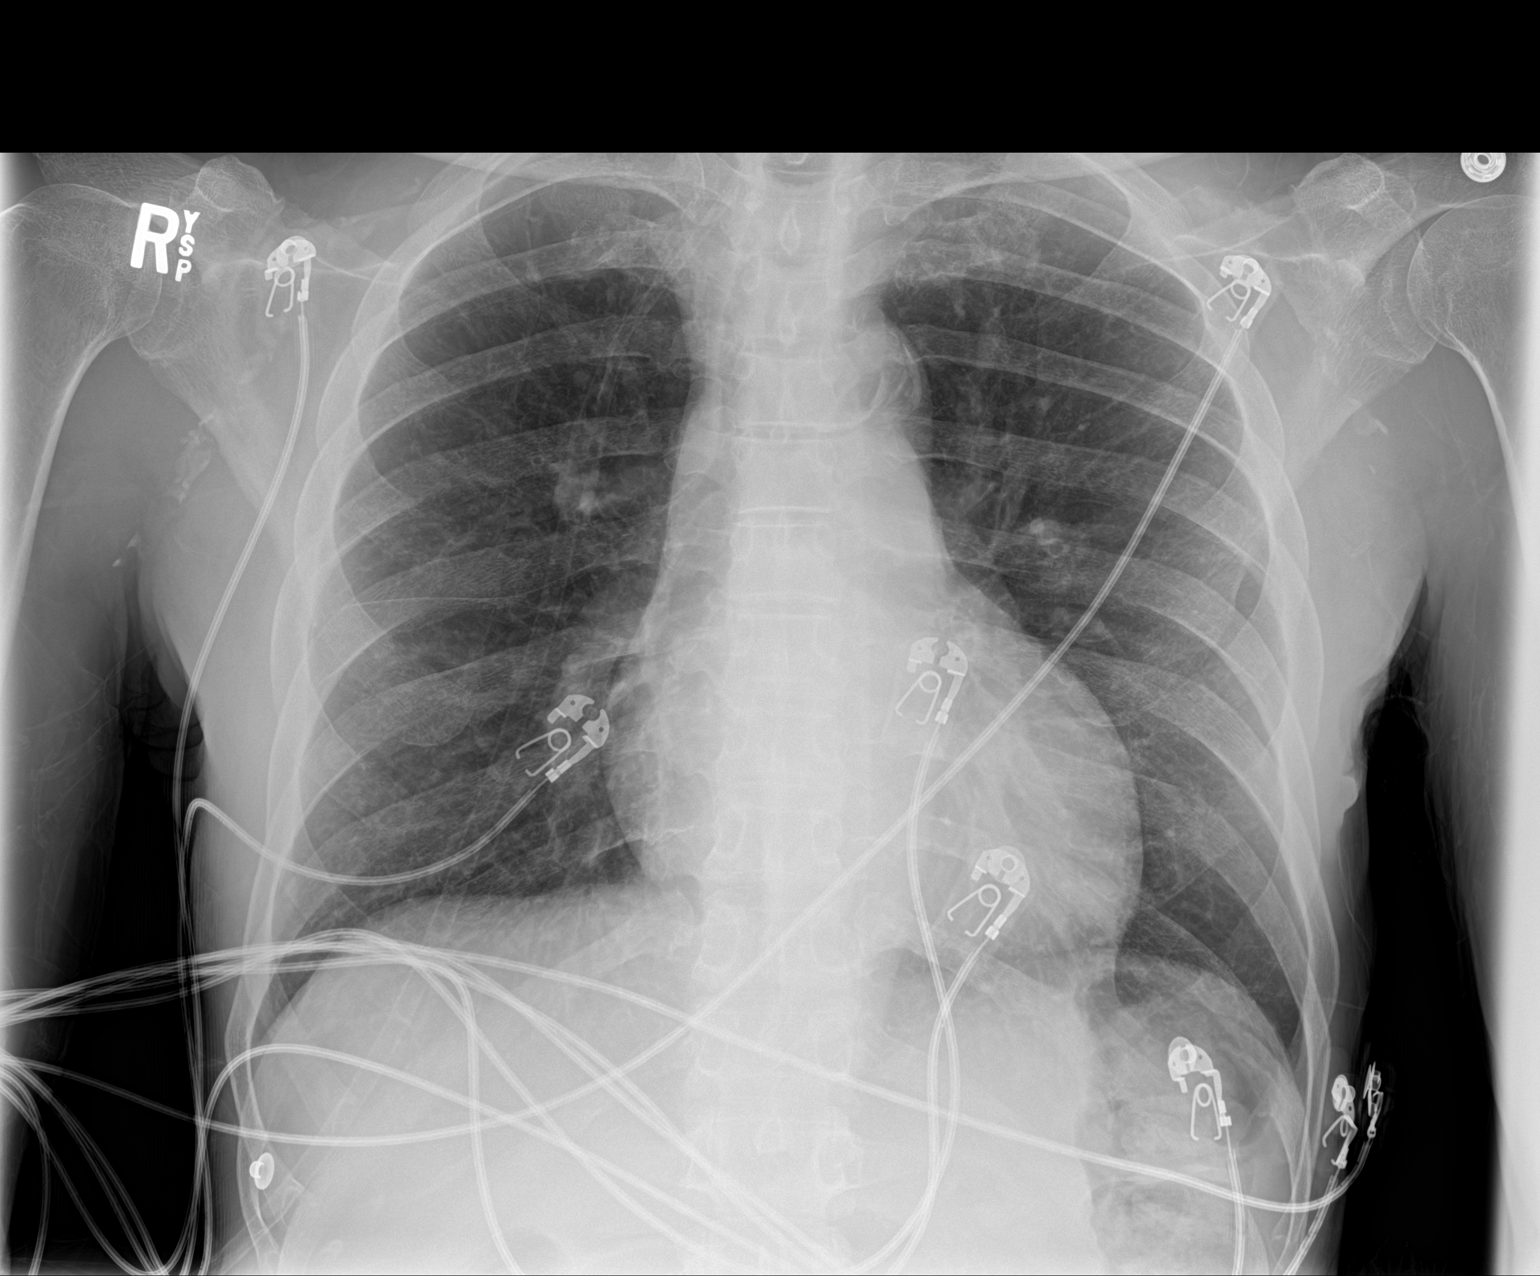

[1 of 1 positions shown; findings below may reference images not displayed]

FINDINGS: Normal heart size. No pleural effusion or edema. Aortic
atherosclerosis. No airspace opacities. The visualized osseous
structures are unremarkable.
IMPRESSION: 1. No active disease
2.  Aortic Atherosclerosis ([id number]).

## 2021-04-11 IMAGING — DX PORTABLE CHEST - 1 VIEW
1 series · 1 of 1 positions shown · non-contrast
Comparison: Chest x-ray 09/03/2018.

CLINICAL DATA: 68-year-old male status post dialysis catheter
insertion.

EXAM:
PORTABLE CHEST 1 VIEW

[chest]
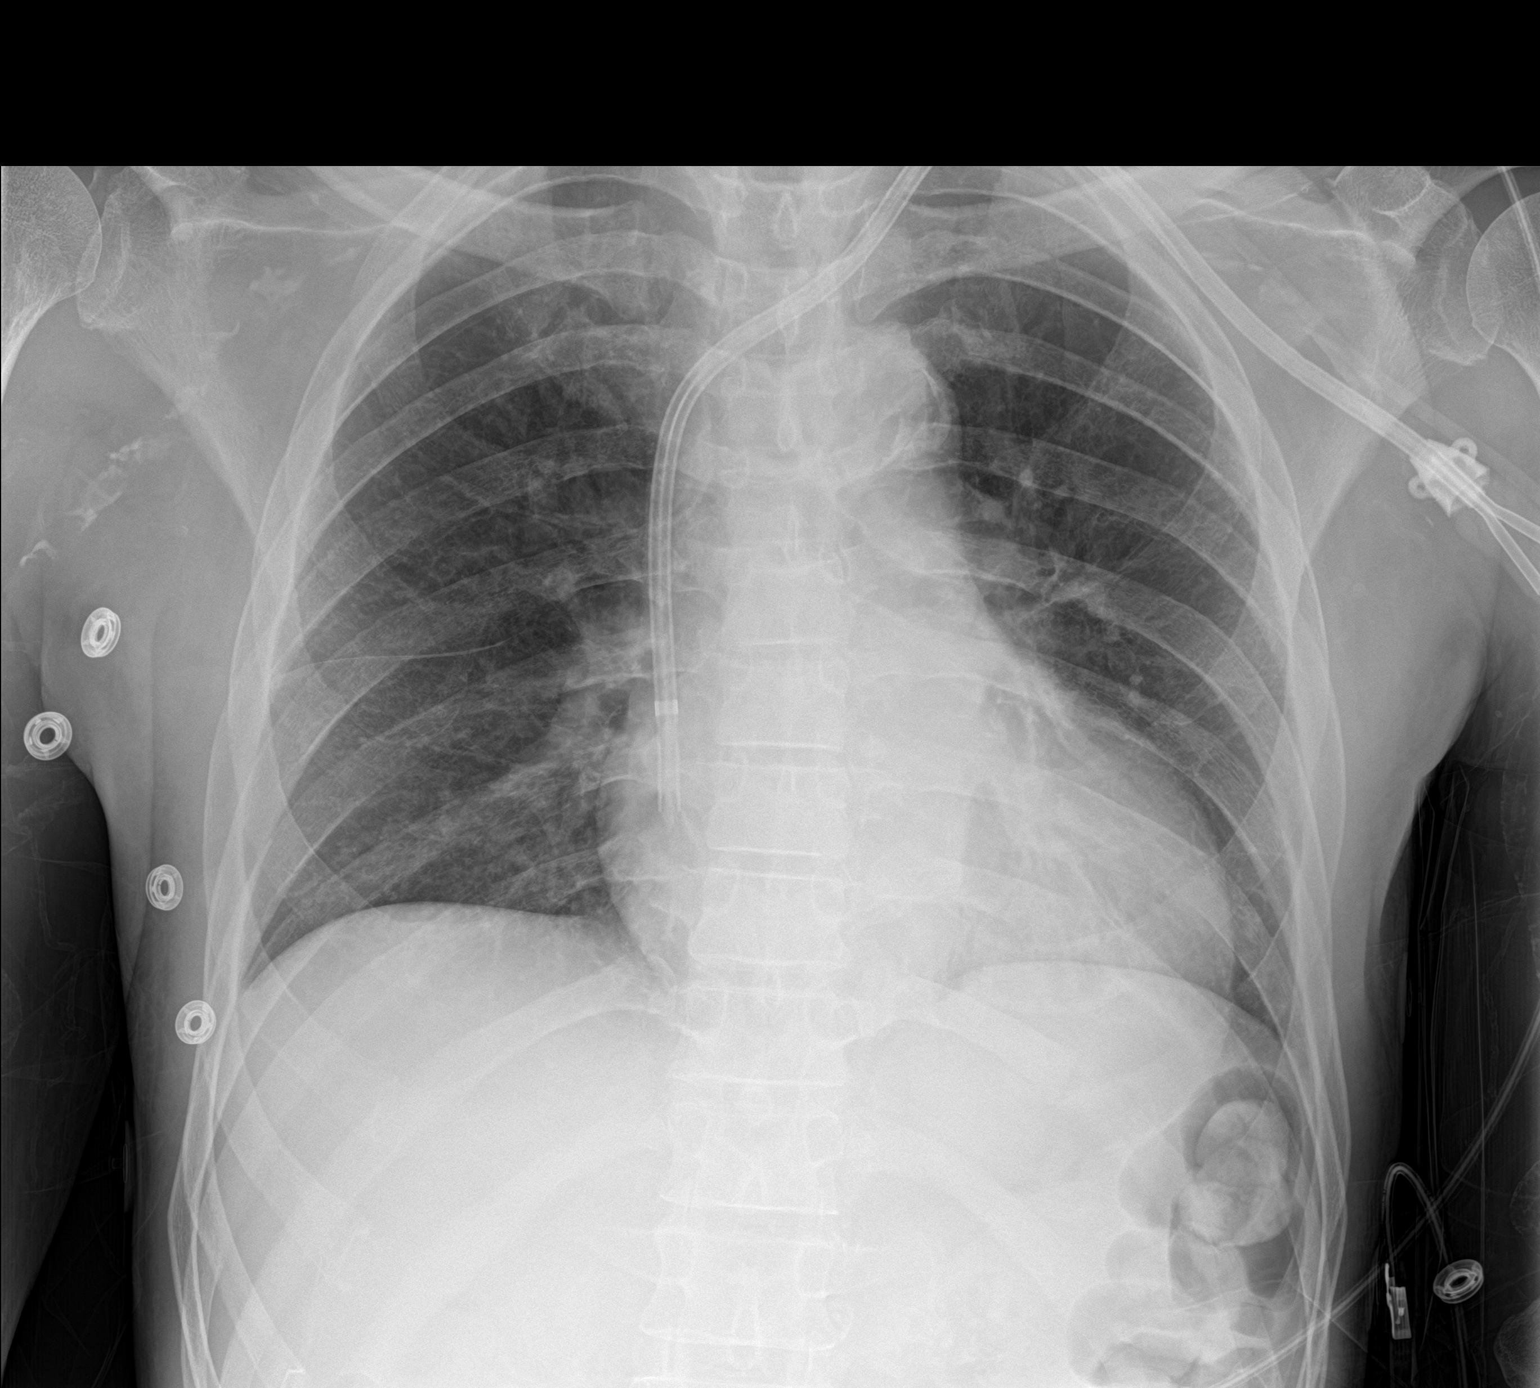

[1 of 1 positions shown; findings below may reference images not displayed]

FINDINGS: New left internal jugular PermCath with tip terminating in the right
atrium. Lung volumes are normal. No consolidative airspace disease.
No pleural effusions. No pneumothorax. No pulmonary nodule or mass
noted. Pulmonary vasculature and the cardiomediastinal silhouette
are within normal limits. Atherosclerosis in the thoracic aorta.
IMPRESSION: 1. Tip of new left IJ PermCath is in the right atrium.
2. No pneumothorax or other acute findings.
3. Aortic atherosclerosis.

## 2021-12-31 ENCOUNTER — Encounter (HOSPITAL_BASED_OUTPATIENT_CLINIC_OR_DEPARTMENT_OTHER): Payer: Medicare (Managed Care) | Admitting: General Surgery

## 2022-01-24 ENCOUNTER — Emergency Department (HOSPITAL_COMMUNITY): Payer: Medicare (Managed Care)

## 2022-01-24 ENCOUNTER — Other Ambulatory Visit: Payer: Self-pay

## 2022-01-24 ENCOUNTER — Inpatient Hospital Stay (HOSPITAL_COMMUNITY)
Admission: EM | Admit: 2022-01-24 | Discharge: 2022-01-27 | DRG: 064 | Disposition: A | Discharge status: Home Health | Payer: Medicare (Managed Care) | Source: Ambulatory Visit | Attending: Internal Medicine | Admitting: Internal Medicine

## 2022-01-24 ENCOUNTER — Encounter (HOSPITAL_COMMUNITY): Payer: Self-pay | Admitting: Internal Medicine

## 2022-01-24 DIAGNOSIS — I358 Other nonrheumatic aortic valve disorders: Secondary | ICD-10-CM | POA: Diagnosis present

## 2022-01-24 DIAGNOSIS — X58XXXA Exposure to other specified factors, initial encounter: Secondary | ICD-10-CM | POA: Diagnosis present

## 2022-01-24 DIAGNOSIS — I120 Hypertensive chronic kidney disease with stage 5 chronic kidney disease or end stage renal disease: Secondary | ICD-10-CM | POA: Diagnosis present

## 2022-01-24 DIAGNOSIS — R278 Other lack of coordination: Secondary | ICD-10-CM | POA: Diagnosis present

## 2022-01-24 DIAGNOSIS — R29701 NIHSS score 1: Secondary | ICD-10-CM | POA: Diagnosis present

## 2022-01-24 DIAGNOSIS — Z992 Dependence on renal dialysis: Secondary | ICD-10-CM | POA: Diagnosis not present

## 2022-01-24 DIAGNOSIS — E785 Hyperlipidemia, unspecified: Secondary | ICD-10-CM | POA: Diagnosis present

## 2022-01-24 DIAGNOSIS — Y9289 Other specified places as the place of occurrence of the external cause: Secondary | ICD-10-CM

## 2022-01-24 DIAGNOSIS — Z66 Do not resuscitate: Secondary | ICD-10-CM | POA: Diagnosis present

## 2022-01-24 DIAGNOSIS — E1122 Type 2 diabetes mellitus with diabetic chronic kidney disease: Secondary | ICD-10-CM | POA: Diagnosis present

## 2022-01-24 DIAGNOSIS — E1165 Type 2 diabetes mellitus with hyperglycemia: Secondary | ICD-10-CM | POA: Diagnosis present

## 2022-01-24 DIAGNOSIS — Z886 Allergy status to analgesic agent status: Secondary | ICD-10-CM

## 2022-01-24 DIAGNOSIS — E871 Hypo-osmolality and hyponatremia: Secondary | ICD-10-CM | POA: Diagnosis present

## 2022-01-24 DIAGNOSIS — E11319 Type 2 diabetes mellitus with unspecified diabetic retinopathy without macular edema: Secondary | ICD-10-CM | POA: Diagnosis present

## 2022-01-24 DIAGNOSIS — D631 Anemia in chronic kidney disease: Secondary | ICD-10-CM | POA: Diagnosis present

## 2022-01-24 DIAGNOSIS — Z89611 Acquired absence of right leg above knee: Secondary | ICD-10-CM

## 2022-01-24 DIAGNOSIS — Z89612 Acquired absence of left leg above knee: Secondary | ICD-10-CM

## 2022-01-24 DIAGNOSIS — M898X9 Other specified disorders of bone, unspecified site: Secondary | ICD-10-CM | POA: Diagnosis present

## 2022-01-24 DIAGNOSIS — I2510 Atherosclerotic heart disease of native coronary artery without angina pectoris: Secondary | ICD-10-CM | POA: Diagnosis present

## 2022-01-24 DIAGNOSIS — Z89511 Acquired absence of right leg below knee: Secondary | ICD-10-CM

## 2022-01-24 DIAGNOSIS — J449 Chronic obstructive pulmonary disease, unspecified: Secondary | ICD-10-CM | POA: Diagnosis present

## 2022-01-24 DIAGNOSIS — E8809 Other disorders of plasma-protein metabolism, not elsewhere classified: Secondary | ICD-10-CM | POA: Diagnosis present

## 2022-01-24 DIAGNOSIS — Z79899 Other long term (current) drug therapy: Secondary | ICD-10-CM

## 2022-01-24 DIAGNOSIS — I6381 Other cerebral infarction due to occlusion or stenosis of small artery: Principal | ICD-10-CM | POA: Diagnosis present

## 2022-01-24 DIAGNOSIS — R253 Fasciculation: Secondary | ICD-10-CM | POA: Diagnosis present

## 2022-01-24 DIAGNOSIS — Z794 Long term (current) use of insulin: Secondary | ICD-10-CM

## 2022-01-24 DIAGNOSIS — F17210 Nicotine dependence, cigarettes, uncomplicated: Secondary | ICD-10-CM | POA: Diagnosis present

## 2022-01-24 DIAGNOSIS — N186 End stage renal disease: Secondary | ICD-10-CM | POA: Diagnosis present

## 2022-01-24 DIAGNOSIS — I69398 Other sequelae of cerebral infarction: Secondary | ICD-10-CM

## 2022-01-24 DIAGNOSIS — E1140 Type 2 diabetes mellitus with diabetic neuropathy, unspecified: Secondary | ICD-10-CM | POA: Diagnosis present

## 2022-01-24 DIAGNOSIS — R944 Abnormal results of kidney function studies: Secondary | ICD-10-CM | POA: Diagnosis present

## 2022-01-24 DIAGNOSIS — I639 Cerebral infarction, unspecified: Secondary | ICD-10-CM | POA: Diagnosis not present

## 2022-01-24 DIAGNOSIS — R680 Hypothermia, not associated with low environmental temperature: Secondary | ICD-10-CM | POA: Diagnosis present

## 2022-01-24 DIAGNOSIS — H548 Legal blindness, as defined in USA: Secondary | ICD-10-CM | POA: Diagnosis present

## 2022-01-24 DIAGNOSIS — Z20822 Contact with and (suspected) exposure to covid-19: Secondary | ICD-10-CM | POA: Diagnosis present

## 2022-01-24 DIAGNOSIS — F419 Anxiety disorder, unspecified: Secondary | ICD-10-CM | POA: Diagnosis not present

## 2022-01-24 DIAGNOSIS — G9341 Metabolic encephalopathy: Secondary | ICD-10-CM | POA: Diagnosis present

## 2022-01-24 DIAGNOSIS — Z833 Family history of diabetes mellitus: Secondary | ICD-10-CM

## 2022-01-24 DIAGNOSIS — S81802A Unspecified open wound, left lower leg, initial encounter: Secondary | ICD-10-CM | POA: Diagnosis present

## 2022-01-24 DIAGNOSIS — E872 Acidosis, unspecified: Secondary | ICD-10-CM | POA: Diagnosis present

## 2022-01-24 DIAGNOSIS — S81801A Unspecified open wound, right lower leg, initial encounter: Secondary | ICD-10-CM | POA: Diagnosis present

## 2022-01-24 DIAGNOSIS — I6389 Other cerebral infarction: Secondary | ICD-10-CM | POA: Diagnosis not present

## 2022-01-24 DIAGNOSIS — Z888 Allergy status to other drugs, medicaments and biological substances status: Secondary | ICD-10-CM

## 2022-01-24 DIAGNOSIS — E1151 Type 2 diabetes mellitus with diabetic peripheral angiopathy without gangrene: Secondary | ICD-10-CM | POA: Diagnosis present

## 2022-01-24 LAB — PROTIME-INR
INR: 1 (ref 0.8–1.2)
Prothrombin Time: 13.2 seconds (ref 11.4–15.2)

## 2022-01-24 LAB — RESP PANEL BY RT-PCR (FLU A&B, COVID) ARPGX2
Influenza A by PCR: NEGATIVE
Influenza B by PCR: NEGATIVE
SARS Coronavirus 2 by RT PCR: NEGATIVE

## 2022-01-24 LAB — COMPREHENSIVE METABOLIC PANEL
ALT: 17 U/L (ref 0–44)
AST: 30 U/L (ref 15–41)
Albumin: 3.2 g/dL — ABNORMAL LOW (ref 3.5–5.0)
Alkaline Phosphatase: 74 U/L (ref 38–126)
Anion gap: 19 — ABNORMAL HIGH (ref 5–15)
BUN: 41 mg/dL — ABNORMAL HIGH (ref 8–23)
CO2: 18 mmol/L — ABNORMAL LOW (ref 22–32)
Calcium: 8.6 mg/dL — ABNORMAL LOW (ref 8.9–10.3)
Chloride: 94 mmol/L — ABNORMAL LOW (ref 98–111)
Creatinine, Ser: 6.65 mg/dL — ABNORMAL HIGH (ref 0.61–1.24)
GFR, Estimated: 8 mL/min — ABNORMAL LOW (ref >60)
Glucose, Bld: 170 mg/dL — ABNORMAL HIGH (ref 70–99)
Potassium: 4.5 mmol/L (ref 3.5–5.1)
Sodium: 131 mmol/L — ABNORMAL LOW (ref 135–145)
Total Bilirubin: 0.7 mg/dL (ref 0.3–1.2)
Total Protein: 7.3 g/dL (ref 6.5–8.1)

## 2022-01-24 LAB — CBC WITH DIFFERENTIAL/PLATELET
Abs Immature Granulocytes: 0.02 10*3/uL (ref 0.00–0.07)
Basophils Absolute: 0 10*3/uL (ref 0.0–0.1)
Basophils Relative: 1 %
Eosinophils Absolute: 0.1 10*3/uL (ref 0.0–0.5)
Eosinophils Relative: 2 %
HCT: 38.4 % — ABNORMAL LOW (ref 39.0–52.0)
Hemoglobin: 12.5 g/dL — ABNORMAL LOW (ref 13.0–17.0)
Immature Granulocytes: 0 %
Lymphocytes Relative: 14 %
Lymphs Abs: 0.9 10*3/uL (ref 0.7–4.0)
MCH: 30 pg (ref 26.0–34.0)
MCHC: 32.6 g/dL (ref 30.0–36.0)
MCV: 92.1 fL (ref 80.0–100.0)
Monocytes Absolute: 0.7 10*3/uL (ref 0.1–1.0)
Monocytes Relative: 11 %
Neutro Abs: 4.5 10*3/uL (ref 1.7–7.7)
Neutrophils Relative %: 72 %
Platelets: 143 10*3/uL — ABNORMAL LOW (ref 150–400)
RBC: 4.17 MIL/uL — ABNORMAL LOW (ref 4.22–5.81)
RDW: 15 % (ref 11.5–15.5)
WBC: 6.3 10*3/uL (ref 4.0–10.5)
nRBC: 0 % (ref 0.0–0.2)

## 2022-01-24 LAB — TSH: TSH: 2.159 u[IU]/mL (ref 0.350–4.500)

## 2022-01-24 LAB — LIPID PANEL
Cholesterol: 128 mg/dL (ref 0–200)
HDL: 44 mg/dL (ref >40)
LDL Cholesterol: 75 mg/dL (ref 0–99)
Total CHOL/HDL Ratio: 2.9 RATIO
Triglycerides: 47 mg/dL (ref <150)
VLDL: 9 mg/dL (ref 0–40)

## 2022-01-24 LAB — HEPATITIS B SURFACE ANTIBODY,QUALITATIVE: Hep B S Ab: REACTIVE — AB

## 2022-01-24 LAB — CBG MONITORING, ED: Glucose-Capillary: 133 mg/dL — ABNORMAL HIGH (ref 70–99)

## 2022-01-24 LAB — PHOSPHORUS
Phosphorus: 5.7 mg/dL — ABNORMAL HIGH (ref 2.5–4.6)
Phosphorus: 5.7 mg/dL — ABNORMAL HIGH (ref 2.5–4.6)

## 2022-01-24 LAB — APTT: aPTT: 29 seconds (ref 24–36)

## 2022-01-24 LAB — HEPATITIS B SURFACE ANTIGEN: Hepatitis B Surface Ag: NONREACTIVE

## 2022-01-24 LAB — AMMONIA: Ammonia: 32 umol/L (ref 9–35)

## 2022-01-24 LAB — HIV ANTIBODY (ROUTINE TESTING W REFLEX): HIV Screen 4th Generation wRfx: NONREACTIVE

## 2022-01-24 LAB — MAGNESIUM: Magnesium: 2.1 mg/dL (ref 1.7–2.4)

## 2022-01-24 MED ORDER — ACETAMINOPHEN 650 MG RE SUPP: 650.0000 mg | Freq: Four times a day (QID) | RECTAL | Status: DC | PRN

## 2022-01-24 MED ORDER — CALCITRIOL 0.25 MCG PO CAPS
0.2500 ug | ORAL_CAPSULE | ORAL | Status: DC
Start: 2022-01-27 — End: 2022-01-27
  Administered 2022-01-27: 0.25 ug via ORAL
  Filled 2022-01-24: qty 1

## 2022-01-24 MED ORDER — ONDANSETRON HCL 4 MG/2ML IJ SOLN: 4.0000 mg | Freq: Four times a day (QID) | INTRAMUSCULAR | Status: DC | PRN

## 2022-01-24 MED ORDER — ONDANSETRON HCL 4 MG PO TABS
4.0000 mg | ORAL_TABLET | Freq: Four times a day (QID) | ORAL | Status: DC | PRN
  Administered 2022-01-25: 4 mg via ORAL
  Filled 2022-01-24: qty 1

## 2022-01-24 MED ORDER — SENNOSIDES-DOCUSATE SODIUM 8.6-50 MG PO TABS: 1.0000 | ORAL_TABLET | Freq: Every evening | ORAL | Status: DC | PRN

## 2022-01-24 MED ORDER — FERRIC CITRATE 1 GM 210 MG(FE) PO TABS
420.0000 mg | ORAL_TABLET | Freq: Three times a day (TID) | ORAL | Status: DC
  Administered 2022-01-25 – 2022-01-27 (×6): 420 mg via ORAL
  Filled 2022-01-24 (×10): qty 2

## 2022-01-24 MED ORDER — ACETAMINOPHEN 325 MG PO TABS
650.0000 mg | ORAL_TABLET | Freq: Four times a day (QID) | ORAL | Status: DC | PRN
  Administered 2022-01-25: 650 mg via ORAL
  Filled 2022-01-24: qty 2

## 2022-01-24 MED ORDER — CHLORHEXIDINE GLUCONATE CLOTH 2 % EX PADS
6.0000 | MEDICATED_PAD | Freq: Every day | CUTANEOUS | Status: DC
  Administered 2022-01-25 – 2022-01-27 (×3): 6 via TOPICAL

## 2022-01-24 MED ORDER — AMLODIPINE BESYLATE 5 MG PO TABS
5.0000 mg | ORAL_TABLET | Freq: Every day | ORAL | Status: DC
  Administered 2022-01-24 – 2022-01-27 (×4): 5 mg via ORAL
  Filled 2022-01-24 (×4): qty 1

## 2022-01-24 MED ORDER — ATORVASTATIN CALCIUM 40 MG PO TABS
40.0000 mg | ORAL_TABLET | Freq: Every day | ORAL | Status: DC
  Administered 2022-01-24 – 2022-01-27 (×4): 40 mg via ORAL
  Filled 2022-01-24 (×4): qty 1

## 2022-01-24 MED ORDER — HEPARIN SODIUM (PORCINE) 5000 UNIT/ML IJ SOLN
5000.0000 [IU] | Freq: Three times a day (TID) | INTRAMUSCULAR | Status: DC
  Administered 2022-01-24 – 2022-01-27 (×8): 5000 [IU] via SUBCUTANEOUS
  Filled 2022-01-24 (×8): qty 1

## 2022-01-24 NOTE — ED Notes (Signed)
Pt being hypertensive systolic over 383, diastolic in the 33O. MD made aware. Also unable to obtain an oral or axillary temp on pt as pt has uncontrollable twitching movements.  ?

## 2022-01-24 NOTE — ED Notes (Signed)
Patient transported to X-ray 

## 2022-01-24 NOTE — ED Notes (Signed)
Admitting MD at bedside.

## 2022-01-24 NOTE — Consult Note (Addendum)
Neurology Consultation  Reason for Consult: Stroke Referring Physician: Antonietta Breach PA-C  CC: Right-sided numbness  History is obtained from: Patient, chart  HPI: Garrett Salas is a 71 y.o. male past medical history of ESRD on dialysis, diabetic retinopathy with legal blindness, diabetic neuropathy, diabetes hypertension, hyperlipidemia, peripheral vascular disease, status post bilateral BKA's presenting to the emergency room from dialysis which she was unable to complete because he was not feeling well.  He had generalized weakness along with tingling numbness on the right side at dialysis for which she was sent for further evaluation to the emergency room.  Due to lateralized complaint of right-sided tingling and numbness MRI was obtained which showed a small lacunar infarct in the right centrum semiovale with severe chronic ischemic injury and atrophy that has progressed from 2016. Although his symptoms do not match the location of the stroke, given the presence of the stroke and his risk factors, neurology was consulted for further stroke risk factor work-up   LKW: Unclear tpa given?: no, unclear last known well Premorbid modified Rankin scale (mRS): 5  ROS: Full ROS was performed and is negative except as noted in the HPI.  Past Medical History:  Diagnosis Date   COPD (chronic obstructive pulmonary disease) (HCC)    Diabetes mellitus    Type 2. Diagnosed in mid 76s. Currently insulin requiring   Diabetic neuropathy ([place])    Diabetic retinopathy    legally blind   Dialysis patient ([place])    ESRD (end stage renal disease) on dialysis ([place])    due to diabetic nephropathy. T/Th/Sat   Gangrene (HCC)    right index   History of blood transfusion    Hx of right BKA (HCC)    Hyperlipidemia    Hypertension    Peripheral vascular disease ([place])    Tobacco abuse    Wears dentures    Family History  Problem Relation Age of Onset   Diabetes Mother    Heart disease Other    Alcohol  abuse Other    Diabetes Sister    Diabetes Brother    Social History:   reports that he has been smoking cigarettes. He has a 2.25 pack-year smoking history. He has never used smokeless tobacco. He reports current drug use. Drug: Marijuana. He reports that he does not drink alcohol.  Medications No current facility-administered medications for this encounter.  Current Outpatient Medications:    ACCU-CHEK AVIVA PLUS test strip, , Disp: , Rfl:    ACCU-CHEK SOFTCLIX LANCETS lancets, , Disp: , Rfl:    albuterol (PROVENTIL HFA;VENTOLIN HFA) 108 (90 BASE) MCG/ACT inhaler, Inhale 2 puffs into the lungs every 6 (six) hours as needed for wheezing or shortness of breath., Disp: , Rfl:    amLODipine (NORVASC) 10 MG tablet, Take 1 tablet (10 mg total) by mouth daily., Disp: 30 tablet, Rfl: 0   atorvastatin (LIPITOR) 40 MG tablet, Take 40 mg by mouth daily., Disp: , Rfl:    AURYXIA 1 GM 210 MG(Fe) tablet, Take 420 mg by mouth 3 (three) times daily with meals. , Disp: , Rfl:    B-D UF III MINI PEN NEEDLES 31G X 5 MM MISC, , Disp: , Rfl:    Blood Glucose Monitoring Suppl (ACCU-CHEK AVIVA PLUS) W/DEVICE KIT, , Disp: , Rfl:    calcitRIOL (ROCALTROL) 0.5 MCG capsule, Take 1 capsule (0.5 mcg total) by mouth Every Tuesday,Thursday,and Saturday with dialysis., Disp: 30 capsule, Rfl:    calcium carbonate (TUMS - DOSED IN MG  ELEMENTAL CALCIUM) 500 MG chewable tablet, Chew 1-2 tablets by mouth 3 (three) times daily as needed for indigestion or heartburn., Disp: , Rfl:    cloNIDine (CATAPRES) 0.1 MG tablet, Take 0.1 mg by mouth 3 (three) times daily., Disp: , Rfl:    diphenhydrAMINE (BENADRYL) 25 MG tablet, Take 25 mg by mouth as needed for itching., Disp: , Rfl:    HYDROcodone-acetaminophen (NORCO) 5-325 MG tablet, Take 1 tablet by mouth every 6 (six) hours as needed for moderate pain., Disp: 10 tablet, Rfl: 0   HYDROcodone-acetaminophen (NORCO) 5-325 MG tablet, 1-2 tabs po q6 hours prn pain, Disp: 30 tablet, Rfl:  0   insulin glargine (LANTUS) 100 UNIT/ML injection, Inject 15 Units into the skin every evening. , Disp: , Rfl:    insulin regular (NOVOLIN R) 100 units/mL injection, Inject 0-5 Units into the skin 3 (three) times daily before meals. Sliding Scale Insulin, Disp: , Rfl:    LYRICA 75 MG capsule, Take 75 mg by mouth See admin instructions. Take 1 capsule (75 mg) by mouth scheduled in the morning & up to twice daily as needed for pain., Disp: , Rfl:    metoCLOPramide (REGLAN) 5 MG tablet, Take 5 mg by mouth 3 (three) times daily before meals. , Disp: , Rfl:    Multiple Vitamin (DAILY VITE PO), Take 1 tablet by mouth Every Tuesday,Thursday,and Saturday with dialysis., Disp: , Rfl:    ULTICARE INSULIN SYRINGE 29G X 1/2" 0.5 ML MISC, , Disp: , Rfl:   Exam: Current vital signs: BP (!) 157/72 (BP Location: Left Arm)    Pulse 75    Temp (!) 95.6 F (35.3 C) (Rectal)    Resp 15    SpO2 98%  Vital signs in last 24 hours: Temp:  [95.6 F (35.3 C)] 95.6 F (35.3 C) (03/11 1332) Pulse Rate:  [75-77] 75 (03/11 1304) Resp:  [15-20] 15 (03/11 1304) BP: (157-212)/(72-81) 157/72 (03/11 1304) SpO2:  [96 %-98 %] 98 % (03/11 1304) General: Awake alert no distress HEENT: Normocephalic atraumatic CVS: Regular rhythm Respiratory: Breathing well saturating normally on room air Neurologic exam Is awake alert oriented x2 Speech is mildly dysarthric Cranial nerve examination: Legally blind, face symmetric, facial sensation intact, shoulder shrug intact. Motor examination with no drift in any of the 4 extremities proximally with florid asterixis on outstretched arms and legs bilaterally Sensation intact to touch Coordination difficult to assess 1a Level of Conscious.: 0 1b LOC Questions: 2 1c LOC Commands: 0 2 Best Gaze: 0 3 Visual: 2 4 Facial Palsy: 0 5a Motor Arm - left: 0 5b Motor Arm - Right: 0 6a Motor Leg - Left: 0 6b Motor Leg - Right: 0 7 Limb Ataxia: 0 8 Sensory: 0 9 Best Language: 0 10  Dysarthria: 1 11 Extinct. and Inatten.: 0 TOTAL: 5  Labs I have reviewed labs in epic and the results pertinent to this consultation are:   CBC    Component Value Date/Time   WBC 6.3 01/24/2022 1139   RBC 4.17 (L) 01/24/2022 1139   HGB 12.5 (L) 01/24/2022 1139   HCT 38.4 (L) 01/24/2022 1139   HCT 29.5 (L) 05/20/2015 1945   PLT 143 (L) 01/24/2022 1139   MCV 92.1 01/24/2022 1139   MCH 30.0 01/24/2022 1139   MCHC 32.6 01/24/2022 1139   RDW 15.0 01/24/2022 1139   LYMPHSABS 0.9 01/24/2022 1139   MONOABS 0.7 01/24/2022 1139   EOSABS 0.1 01/24/2022 1139   BASOSABS 0.0 01/24/2022 1139  CMP     Component Value Date/Time   NA 131 (L) 01/24/2022 1139   K 4.5 01/24/2022 1139   CL 94 (L) 01/24/2022 1139   CO2 18 (L) 01/24/2022 1139   GLUCOSE 170 (H) 01/24/2022 1139   BUN 41 (H) 01/24/2022 1139   CREATININE 6.65 (H) 01/24/2022 1139   CALCIUM 8.6 (L) 01/24/2022 1139   PROT 7.3 01/24/2022 1139   ALBUMIN 3.2 (L) 01/24/2022 1139   AST 30 01/24/2022 1139   ALT 17 01/24/2022 1139   ALKPHOS 74 01/24/2022 1139   BILITOT 0.7 01/24/2022 1139   GFRNONAA 8 (L) 01/24/2022 1139   GFRAA 9 (L) 06/12/2019 1134    Imaging I have reviewed the images obtained: MRI of the brain personally reviewed with a small right centrum semiovale stroke along with severe chronic ischemic changes progressed since 2016  Assessment:  71 year old man with a myriad of vascular risk factors presenting for evaluation of generalized weakness and right-sided numbness.  MRI of the brain reveals a right centrum semiovale infarct-which does not correlate with his findings but given his risk factors and the presence of the stroke, will benefit from stroke risk factor work-up. On examination, has no focal weakness but is florid asterixis.  His current presentation with weakness might be related to uremia and hypertensive urgency because his stroke on the MRI does not correlate with right-sided symptoms given right  cerebral location.  Impression: Acute ischemic stroke Toxic metabolic encephalopathy Asterixis-likely secondary to uremia  Recommendations: -Admit to hospitalist -Frequent neurochecks -Telemetry -2D echo -A1c -Lipid panel -Carotid Dopplers -MRI head without contrast -Will avoid CT angiography given his ESRD -Aspirin 81 -Continue home atorvastatin 40 -PT OT speech therapy -Bedside swallow evaluation-okay to resume diet if passes swallow eval. -Asterixis likely due to uremia but I will also check ammonia level. -Also will check toxicology screen if still producing urine. -Unclear last known well-no need for permissive hypertension-goal blood pressure will be to avoid hypotension and keep systolic blood pressure between 100-140 on discharge. -Correction of toxic metabolic derangements per primary team including dialysis -Patient was hypothermic-we will defer to the primary team's to pursue work-up  Stroke team to follow  Discussed with K. Humes PA-C and J. Gilford Raid, MD  -- Amie Portland, MD Neurologist [facility] Pager: [phone]

## 2022-01-24 NOTE — Consult Note (Signed)
Renal Service Consult Note [facility] 01/24/2022 Garrett Blazing, MD Requesting Physician: Dr Philipp Ovens  Reason for Consult: ESRD pt w/ CVA HPI: The patient is a 71 y.o. year-old w/ hx of COPD, DM2, ESRD on HD, hx R BKA, HL, HTN, PAD who was sent to ED from dialysis due to generalized weakness (did not get any dialysis). In ED he had R sided weakness complaints. MRI showed lacunar infarct along w/ severe chronic ischemic changes. Neurology was consulted and pt admitted. We are asked to see for ESRD.    Pt seen in room.  Poor historian. No CP or abd pain.  Did not get any HD today. States has been on HD for about 20 yrs.   ROS - denies CP, no joint pain, no HA, no blurry vision, no rash, no diarrhea, no nausea/ vomiting,    Past Medical History  Past Medical History:  Diagnosis Date   COPD (chronic obstructive pulmonary disease) (HCC)    Diabetes mellitus    Type 2. Diagnosed in mid 36s. Currently insulin requiring   Diabetic neuropathy ([place])    Diabetic retinopathy    legally blind   Dialysis patient ([place])    ESRD (end stage renal disease) on dialysis ([place])    due to diabetic nephropathy. T/Th/Sat   Gangrene (HCC)    right index   History of blood transfusion    Hx of right BKA ([place])    Hyperlipidemia    Hypertension    Peripheral vascular disease ([place])    Tobacco abuse    Wears dentures    Past Surgical History  Past Surgical History:  Procedure Laterality Date   A/V FISTULAGRAM N/A 05/22/2019   Procedure: A/V FISTULAGRAM - Right Arm;  Surgeon: Waynetta Sandy, MD;  Location: [facility];  Service: Cardiovascular;  Laterality: N/A;   ABDOMINAL AORTAGRAM N/A 01/07/2015   Procedure: ABDOMINAL Maxcine Ham;  Surgeon: Conrad Rush Hill, MD;  Location: [facility] CATH LAB;  Service: Cardiovascular;  Laterality: N/A;   AMPUTATION Right 01/09/2015   Procedure: AMPUTATION BELOW KNEE RIGHT LEG;  Surgeon: Conrad McDowell, MD;  Location: [facility];  Service:  Vascular;  Laterality: Right;   AMPUTATION Left 05/16/2015   Procedure: LEFT BELOW THE KNEE AMPUTATION;  Surgeon: Conrad , MD;  Location: [facility];  Service: Vascular;  Laterality: Left;   AMPUTATION Right 06/12/2019   Procedure: AMPUTATION RAY RIGHT INDEX FINGER;  Surgeon: Leanora Cover, MD;  Location: [facility];  Service: Orthopedics;  Laterality: Right;   AMPUTATION Right 06/26/2019   Procedure: RIGHT SMALL FINGER AMPUTATION;  Surgeon: Leanora Cover, MD;  Location: [facility];  Service: Orthopedics;  Laterality: Right;   AV FISTULA PLACEMENT     CARDIAC CATHETERIZATION  05/06/2011   no significant CAD, EF 55-60%, LVEDP :15    INSERTION OF DIALYSIS CATHETER N/A 05/24/2019   Procedure: INSERTION OF DIALYSIS CATHETER;  Surgeon: Waynetta Sandy, MD;  Location: [place];  Service: Vascular;  Laterality: N/A;   LIGATION OF ARTERIOVENOUS  FISTULA Right 05/24/2019   Procedure: LIGATION OF ARTERIOVENOUS  FISTULA RIGHT ARM;  Surgeon: Waynetta Sandy, MD;  Location: [facility];  Service: Vascular;  Laterality: Right;   MULTIPLE TOOTH EXTRACTIONS     none     UPPER EXTREMITY ANGIOGRAPHY Right 05/22/2019   Procedure: Upper Extremity Angiography;  Surgeon: Waynetta Sandy, MD;  Location: [facility] CV [facility];  Service: Cardiovascular;  Laterality: Right;   UPPER EXTREMITY VENOGRAPHY Left  05/22/2019   Procedure: UPPER EXTREMITY VENOGRAPHY;  Surgeon: Waynetta Sandy, MD;  Location: [facility] CV [facility];  Service: Cardiovascular;  Laterality: Left;   Family History  Family History  Problem Relation Age of Onset   Diabetes Mother    Heart disease Other    Alcohol abuse Other    Diabetes Sister    Diabetes Brother    Social History  reports that he has been smoking cigarettes. He has a 2.25 pack-year smoking history. He has never used smokeless tobacco. He reports current drug use. Drug: Marijuana. He reports that he does not drink alcohol. Allergies  Allergies  Allergen Reactions    Lisinopril Anaphylaxis and Rash    Other reaction(s): Breathing Problems Angioedema    Aspirin     Other reaction(s): Other (See Comments) Bleeding from dialysis fistula   Home medications Prior to Admission medications   Medication Sig Start Date End Date Taking? Authorizing Provider  albuterol (PROVENTIL HFA;VENTOLIN HFA) 108 (90 BASE) MCG/ACT inhaler Inhale 2 puffs into the lungs every 6 (six) hours as needed for wheezing or shortness of breath.   Yes [provider]  amLODipine (NORVASC) 5 MG tablet Take 5 mg by mouth daily. 12/18/21  Yes [provider]  atorvastatin (LIPITOR) 40 MG tablet Take 40 mg by mouth daily.   Yes [provider]  AURYXIA 1 GM 210 MG(Fe) tablet Take 420 mg by mouth 3 (three) times daily with meals.  02/15/19  Yes [provider]  calcitRIOL (ROCALTROL) 0.5 MCG capsule Take 1 capsule (0.5 mcg total) by mouth Every Tuesday,Thursday,and Saturday with dialysis. 05/24/15  Yes Nita Sells, MD  calcium carbonate (TUMS - DOSED IN MG ELEMENTAL CALCIUM) 500 MG chewable tablet Chew 1-2 tablets by mouth 3 (three) times daily as needed for indigestion or heartburn.   Yes [provider]  diphenhydrAMINE (BENADRYL) 25 MG tablet Take 25 mg by mouth as needed for itching.   Yes [provider]  LYRICA 75 MG capsule Take 75 mg by mouth See admin instructions. Take 1 capsule (75 mg) by mouth scheduled in the morning & up to twice daily as needed for pain. 04/23/15  Yes [provider]  Multiple Vitamin (DAILY VITE PO) Take 1 tablet by mouth Every Tuesday,Thursday,and Saturday with dialysis.   Yes [provider]  sitaGLIPtin (JANUVIA) 25 MG tablet Take 25 mg by mouth daily.   Yes [provider]  ACCU-CHEK AVIVA PLUS test strip  01/17/15   [provider]  ACCU-CHEK SOFTCLIX LANCETS lancets  01/16/15   [provider]  amLODipine (NORVASC) 10 MG tablet Take 1 tablet (10 mg total) by  mouth daily. Patient not taking: Reported on 01/24/2022 05/24/15   Nita Sells, MD  B-D UF III MINI PEN NEEDLES 31G X 5 MM MISC  01/21/15   [provider]  Blood Glucose Monitoring Suppl (ACCU-CHEK AVIVA PLUS) W/DEVICE KIT  01/16/15   [provider]  HYDROcodone-acetaminophen (NORCO) 5-325 MG tablet Take 1 tablet by mouth every 6 (six) hours as needed for moderate pain. Patient not taking: Reported on 01/24/2022 05/24/19   Dagoberto Ligas, PA-C  HYDROcodone-acetaminophen [facility]) 5-325 MG tablet 1-2 tabs po q6 hours prn pain Patient not taking: Reported on 01/24/2022 06/26/19   Leanora Cover, MD  metoCLOPramide (REGLAN) 5 MG tablet Take 5 mg by mouth 3 (three) times daily before meals.  Patient not taking: Reported on 01/24/2022 03/18/18   [provider]  [place] X 1/2"  0.5 ML MISC  04/08/15   [provider]     Vitals:   01/24/22 1304 01/24/22 1332 01/24/22 1517 01/24/22 1557  BP: (!) 157/72  (!) 152/67 (!) 172/62  Pulse: 75  74 74  Resp: _0 Temp:  (!) 95.6 F (35.3 C)  97.6 F (36.4 C)  TempSrc:  Rectal  Oral  SpO2: 98%  99% 97%   Exam Gen alert, poor vision, responds appropriately Sclera anicteric, throat clear  No jvd or bruits Chest clear bilat to bases, no rales/ wheezing RRR no RG Abd soft ntnd no mass or ascites +bs GU normal male MS R hand finger amps Ext no LE edema, bilat BKA Neuro is alert, Ox 3 , nf   RIJ TDC intact      Home meds include - norvasc 5, lipitor, auryxia 2 ac tid, lyrica 75 bid, januvia, norco prn, reglan 5 tid , prns/ vits / supps        OP HD: TTS [facility]  3.5h  2k/3Ca bath   43.3kg  P2  TDC  Hep none  - rocaltrol 0.25 po tiw  - no esa, last Hb 11  - last HD 3/09   Assessment/ Plan: Acute CVA - acute lacunar CVA per MRI.  Per pmd.  ESRD - on HD TTS.  Plan HD possibly tonight vs tomorrow.  Asterixis - possibly uremia (but azotemia is not bad) vs Lyrica. Hold Lyrica and any  narcotics. HD as above.  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 Ca in range. Add on phos      Rob Jamita Mckelvin  MD 01/24/2022, 7:00 PM Recent Labs  Lab 01/24/22 1139  HGB 12.5*  ALBUMIN 3.2*  CALCIUM 8.6*  CREATININE 6.65*  K 4.5

## 2022-01-24 NOTE — ED Notes (Signed)
Bair hugger applied.

## 2022-01-24 NOTE — ED Triage Notes (Signed)
Patient bib EMS from dialysis center. Unable to complete dialysis today because patient had complaints ofnot feeling well. Has had generalized weakness and fatigue since yesterday morning. Double below the knee ampute with wounds that have been recently treated with antibiotics.  ? ? ?BP: 180 palpated ?HR: 75 ?O2: 96% on 2L PRN oxygen ?CBG: 208 ? ?

## 2022-01-24 NOTE — ED Notes (Signed)
Patient transported to MRI 

## 2022-01-24 NOTE — ED Notes (Signed)
Unable to obtain temp at this time. 

## 2022-01-24 NOTE — ED Notes (Signed)
Pt's rectal temp 95.6. EDP made aware.  ?

## 2022-01-24 NOTE — H&P (Addendum)
Date: 01/24/2022               Patient Name:  Garrett Salas MRN: [id number]  DOB: [birth year] Age / Sex: 71 y.o., male   PCP: [name], MD         Medical Service: Internal Medicine Teaching Service         Attending Physician: Dr. Velna Ochs, MD    First Contact: Dr. Marlou Sa Pager: [phone]  Second Contact: Dr. Coy Saunas Pager: [phone]       After Hours (After 5p/  First Contact Pager: [phone]  weekends / holidays): Second Contact Pager: [phone]   Chief Complaint: Weakness  History of Present Illness: Garrett Salas. [name] is a 71 year old male with past medical history of ESRD on HD TTSa, CVA, hyperlipidemia, type 2 diabetes, hypertension, bilateral BKA who presents with new onset weakness.  The patient states that he woke up around 430 this morning and felt weak on his right side leading him to skip HD session for today and come for further evaluation.  He endorses some dizziness and presyncope as well.  Last known normal before going to bed last night.  He denies headaches, dyspnea, chest pain, vision changes though he does note his vision is very poor at baseline.  He has total body twitching which he says is residual from previous stroke and worse today; last night he describes it as "peaceful." He endorses occasional confusion and says that he has had some trouble communicating his thoughts today.  He knows what he wants to say but is having difficulty getting his words out.  ED course: Initially hypothermic at 95.6 F.  CBC with no leukocytosis, normocytic anemia roughly at patient's baseline, low platelets 143.  CMP with hyponatremia 131, hypochloridemia 94, low bicarbonate 18, hyperglycemia 170, elevated BUN/Cr 41/6.65 though creatinine appears roughly baseline, hypoalbuminemia 3.2, GFR 8, anion gap 19. PT/INR, aPTT normal. COVID-19 and flu negative. Ammonia WNL. CXR showed low lung volumes without radiographic evidence of acute cardiopulmonary disease, aortic  atherosclerosis. MRI brain showed acute lacunar infarct in the right centrum semiovale, severe chronic ischemic injury and atrophy that has progressed from 2016.  Meds: His wife confirmed that he is not taking calcitriol or Januvia. Current Meds  Medication Sig   albuterol (PROVENTIL HFA;VENTOLIN HFA) 108 (90 BASE) MCG/ACT inhaler Inhale 2 puffs into the lungs every 6 (six) hours as needed for wheezing or shortness of breath.   amLODipine (NORVASC) 5 MG tablet Take 5 mg by mouth daily.   atorvastatin (LIPITOR) 40 MG tablet Take 40 mg by mouth daily.   AURYXIA 1 GM 210 MG(Fe) tablet Take 420 mg by mouth 3 (three) times daily with meals.    calcitRIOL (ROCALTROL) 0.5 MCG capsule Take 1 capsule (0.5 mcg total) by mouth Every Tuesday,Thursday,and Saturday with dialysis.   calcium carbonate (TUMS - DOSED IN MG ELEMENTAL CALCIUM) 500 MG chewable tablet Chew 1-2 tablets by mouth 3 (three) times daily as needed for indigestion or heartburn.   diphenhydrAMINE (BENADRYL) 25 MG tablet Take 25 mg by mouth as needed for itching.   LYRICA 75 MG capsule Take 75 mg by mouth See admin instructions. Take 1 capsule (75 mg) by mouth scheduled in the morning & up to twice daily as needed for pain.   Multiple Vitamin (DAILY VITE PO) Take 1 tablet by mouth Every Tuesday,Thursday,and Saturday with dialysis.   sitaGLIPtin (JANUVIA) 25 MG tablet Take 25 mg by mouth daily.     Allergies:  Allergies as of 01/24/2022 - Review Complete 01/24/2022  Allergen Reaction Noted   Lisinopril Anaphylaxis and Rash 03/11/2020   Aspirin  01/29/2020   Past Medical History:  Diagnosis Date   COPD (chronic obstructive pulmonary disease) (HCC)    Diabetes mellitus    Type 2. Diagnosed in mid 39s. Currently insulin requiring   Diabetic neuropathy ([place])    Diabetic retinopathy    legally blind   Dialysis patient ([place])    ESRD (end stage renal disease) on dialysis ([place])    due to diabetic nephropathy. T/Th/Sat   Gangrene (HCC)     right index   History of blood transfusion    Hx of right BKA (HCC)    Hyperlipidemia    Hypertension    Peripheral vascular disease ([place])    Tobacco abuse    Wears dentures     Family History: Patient does not give specifics, just says that family history is "very bad."  Social History:  Lives with: Wife and son in [place] Activity level: Reports being independent for ADLs.  Says that he uses a wheelchair and occasionally a walker, has bilateral prosthetics. PCP: At [facility] Substance use: Denies alcohol use.  Endorses smoking 4 cigarettes a day, "forever."  Smokes marijuana "any chance he gets," at least 4 times per day.  Denies other illicit substance use.  Review of Systems: A complete ROS was negative except as per HPI.   Physical Exam: Blood pressure (!) 172/62, pulse 74, temperature 97.6 F (36.4 C), temperature source Oral, resp. rate 14, SpO2 97 %. Constitutional: Chronically ill-appearing gentleman resting comfortably in bed.  No acute distress noted. Eyes: Lateral deviation of left eye. Cardio: Regular rate and rhythm.  Systolic murmur noted. Pulm: Clear to auscultation bilaterally.  No accessory muscle use. Abdomen: Soft, nontender, nondistended. MSK: Bilateral BKA. Skin: Warm and dry. Neuro: Mental Status: Patient is awake, alert, oriented x3.  Easily identifies wristwatch, glasses, face mask. No signs of aphasia or neglect Cranial Nerves: III,IV, VI: Patient does not track finger. V: Facial sensation is symmetric to light touch and temperature. VII: Facial movement is symmetric.  VIII: Hearing is intact to voice XI: Shoulder shrug is symmetric. XII: Tongue is midline without atrophy or fasciculations.  Motor: Normal effort thorughout, at least 5/5 bilateral UE, 5/5 bilateral LE.  Total body asterixis noted. Sensory: Sensation is grossly intact bilateral UE & LE Cerebellar: Finger-Nose intact bilaterally Psych: Pleasantly confused.  EKG:  personally reviewed my interpretation is sinus or ectopic atrial rhythm, probable left atrial enlargement, nonspecific IVCD with LAD, LVH with secondary repolarization abnormality.  CXR: personally reviewed my interpretation is low lung volumes without radiographic evidence of acute cardiopulmonary disease, aortic atherosclerosis.  MRI brain: acute lacunar infarct in the right centrum semiovale, severe chronic ischemic injury and atrophy that has progressed from 2016.  Assessment & Plan by Problem: Principal Problem:   Lacunar infarct, acute ([place])  Acute ischemic stroke Work-up thus far remarkable for MRI brain showing acute lacunar infarct in the right centrum semiovale and severe chronic ischemic injury and atrophy that is progressed from 2016.  Aspects of neurological exam are normal, including strength assessment, orientation question responses, identification of common objects, however his recollection of events preceding hospital presentation are incorrect.  He was quite hypertensive with BP max of 212/81. -Neurology following, appreciate their assistance -F/u echo -F/u HbA1c -F/u lipid panel -F/u carotid dopplers -PT/OT consulted  ESRD on HD TTSa Anion gap metabolic acidosis Metabolic encephalopathy Asterixis Patient is  pleasantly confused at this time.  He did not complete HD session today. Admission labs concerning for uremia as compared to baseline and he did not complete scheduled HD session today.  He also has low bicarb and increased anion gap.  Ammonia within normal limits.  He has overt total body asterixis which he claims to be secondary to previous CVA and worsened since he woke up this morning, however I feel that it is more likely that this is related to uremic state.  Mild hypothermia may also be contributing to altered mentation, though temperature has responded appropriately since presentation.  No obvious sign of infection at this time, does not meet SIRS criteria (heart  rate normal, respiratory rate normal, no leukocytosis). -Nephrology consulted, appreciate their assistance -F/u TSH -F/u Mg, phosphorus -Bear hugger in place  Hx type 2 diabetes mellitus Bilateral BKA Last A1c 05/2021 of 6.1%.  Though prescribed Januvia, patient is not taking it.  Admission labs revealed hyperglycemia at 170.  Patient reports that bilateral AKA were ultimate result of diabetic wound infections. -CBG per unit protocol -F/u HbA1c  Hx HTN Managed outpatient with amlodipine 5 mg daily.  Unclear if new CVA was related to significant hypertension noted at admission, BP max thus far of 212/81.  Of note he did not complete HD session today and this may be contributing to clinical picture. -Continue home amlodipine 5 mg daily -No indication for permissive hypertension at this time  Hx HLD Managed outpatient with atorvastatin 40 mg daily.  Most recent lipid profile 05/2021 with LDL 63.  ASCVD of 38.4% based on those results. -Continue home atorvastatin 40 mg daily -F/u lipid panel  Diet: Renal with 1200 mL fluid restriction, pending bedside swallow eval. CODE STATUS: DNR Dispo: Admit patient to Inpatient with expected length of stay greater than 2 midnights.  Signed: Farrel Gordon, DO 01/24/2022, 4:56 PM  Pager: [phone] After 5pm on weekdays and 1pm on weekends: On Call pager: [phone]

## 2022-01-24 NOTE — ED Provider Notes (Signed)
[facility] EMERGENCY DEPARTMENT Provider Note   CSN: [id number] Arrival date & time: 01/24/22  1022     History  Chief Complaint  Patient presents with   Weakness    Garrett Salas is a 71 y.o. male.  71 y/o male with hx of ESRD (T/Th/S dialysis), DM, HTN, COPD, HLD, chronic BLE wounds presents to the ED for evaluation of weakness. Presenting via EMS from his dialysis center where dialysis was unable to be completed. During my encounter with the patient he reports feeling increasingly sleepy and fatigued. He notes some nonspecific SOB w/o chest pain. Currently on supplemental O2, but with hx of intermittent O2 requirement which he uses PRN. Later reporting to [facility] that dialysis was stopped because he thought he was going to "have a stroke". Reports subjective numbness and weakness on his R side which has now resolved. No fevers, N/V. Apparently was recently tx with abx for management of chronic wounds to his BLE amputation stumps.  The history is provided by the patient. No language interpreter was used.      Home Medications Prior to Admission medications   Medication Sig Start Date End Date Taking? Authorizing Provider  ACCU-CHEK AVIVA PLUS test strip  01/17/15   [provider]  ACCU-CHEK SOFTCLIX LANCETS lancets  01/16/15   [provider]  albuterol (PROVENTIL HFA;VENTOLIN HFA) 108 (90 BASE) MCG/ACT inhaler Inhale 2 puffs into the lungs every 6 (six) hours as needed for wheezing or shortness of breath.    [provider]  amLODipine (NORVASC) 10 MG tablet Take 1 tablet (10 mg total) by mouth daily. 05/24/15   Rhetta Mura, MD  atorvastatin (LIPITOR) 40 MG tablet Take 40 mg by mouth daily.    [provider]  AURYXIA 1 GM 210 MG(Fe) tablet Take 420 mg by mouth 3 (three) times daily with meals.  02/15/19   [provider]  B-D UF III MINI PEN NEEDLES 31G X 5 MM MISC  01/21/15   [provider]  Blood  Glucose Monitoring Suppl (ACCU-CHEK AVIVA PLUS) W/DEVICE KIT  01/16/15   [provider]  calcitRIOL (ROCALTROL) 0.5 MCG capsule Take 1 capsule (0.5 mcg total) by mouth Every Tuesday,Thursday,and Saturday with dialysis. 05/24/15   Rhetta Mura, MD  calcium carbonate (TUMS - DOSED IN MG ELEMENTAL CALCIUM) 500 MG chewable tablet Chew 1-2 tablets by mouth 3 (three) times daily as needed for indigestion or heartburn.    [provider]  cloNIDine (CATAPRES) 0.1 MG tablet Take 0.1 mg by mouth 3 (three) times daily. 12/29/17   [provider]  diphenhydrAMINE (BENADRYL) 25 MG tablet Take 25 mg by mouth as needed for itching.    [provider]  HYDROcodone-acetaminophen (NORCO) 5-325 MG tablet Take 1 tablet by mouth every 6 (six) hours as needed for moderate pain. 05/24/19   Emilie Rutter, PA-C  HYDROcodone-acetaminophen (NORCO) 5-325 MG tablet 1-2 tabs po q6 hours prn pain 06/26/19   Betha Loa, MD  insulin glargine (LANTUS) 100 UNIT/ML injection Inject 15 Units into the skin every evening.     [provider]  insulin regular (NOVOLIN R) 100 units/mL injection Inject 0-5 Units into the skin 3 (three) times daily before meals. Sliding Scale Insulin    [provider]  LYRICA 75 MG capsule Take 75 mg by mouth See admin instructions. Take 1 capsule (75 mg) by mouth scheduled in the morning & up to twice daily as needed for pain. 04/23/15  [provider]  metoCLOPramide (REGLAN) 5 MG tablet Take 5 mg by mouth 3 (three) times daily before meals.  03/18/18   [provider]  Multiple Vitamin (DAILY VITE PO) Take 1 tablet by mouth Every Tuesday,Thursday,and Saturday with dialysis.    [provider]  Flossie Buffy INSULIN SYRINGE 29G X 1/2" 0.5 ML MISC  04/08/15   [provider]      Allergies    Patient has no known allergies.    Review of Systems   Review of Systems Ten systems reviewed and are negative for acute  change, except as noted in the HPI.    Physical Exam Updated Vital Signs BP (!) 157/72 (BP Location: Left Arm)    Pulse 75    Temp (!) 95.6 F (35.3 C) (Rectal)    Resp 15    SpO2 98%   Physical Exam Vitals and nursing note reviewed.  Constitutional:      General: He is not in acute distress.    Appearance: He is well-developed. He is not diaphoretic.     Comments: Alert, but appears sleepy. Will remain awake to answer questions. Speech is clear. Appears chronically ill, nontoxic.  HENT:     Head: Normocephalic and atraumatic.  Eyes:     General: No scleral icterus.    Conjunctiva/sclera: Conjunctivae normal.  Cardiovascular:     Rate and Rhythm: Normal rate and regular rhythm.     Pulses: Normal pulses.  Pulmonary:     Effort: Pulmonary effort is normal. No respiratory distress.     Breath sounds: No stridor. No wheezing.     Comments: Respirations even and unlabored Abdominal:     General: There is no distension.     Palpations: Abdomen is soft.  Musculoskeletal:        General: Normal range of motion.     Cervical back: Normal range of motion.     Comments: Chronic wounds to BLE stumps. No active purulence, drainage. No surrounding erythema, heat to touch, lymphangitic streaking.  Skin:    General: Skin is warm and dry.     Coloration: Skin is not pale.     Findings: No erythema or rash.  Neurological:     Mental Status: He is alert and oriented to person, place, and time.     Coordination: Coordination normal.     Comments: GCS 15. Speech is goal oriented. No cranial nerve deficits appreciated; symmetric eyebrow raise, no facial drooping, tongue midline. Patient moves extremities spontaneously and symmetrically. No obvious focal deficit. Gait unable to be assessed.  Psychiatric:        Behavior: Behavior normal.    ED Results / Procedures / Treatments   Labs (all labs ordered are listed, but only abnormal results are displayed) Labs Reviewed  CBC WITH  DIFFERENTIAL/PLATELET - Abnormal; Notable for the following components:      Result Value   RBC 4.17 (*)    Hemoglobin 12.5 (*)    HCT 38.4 (*)    Platelets 143 (*)    All other components within normal limits  COMPREHENSIVE METABOLIC PANEL - Abnormal; Notable for the following components:   Sodium 131 (*)    Chloride 94 (*)    CO2 18 (*)    Glucose, Bld 170 (*)    BUN 41 (*)    Creatinine, Ser 6.65 (*)    Calcium 8.6 (*)    Albumin 3.2 (*)    GFR, Estimated 8 (*)    Anion gap  19 (*)    All other components within normal limits  RESP PANEL BY RT-PCR (FLU A&B, COVID) ARPGX2  PROTIME-INR  APTT  AMMONIA  CBG MONITORING, ED    EKG EKG Interpretation  Date/Time:  Saturday January 24 2022 13:04:07 EST Ventricular Rate:  75 PR Interval:  146 QRS Duration: 126 QT Interval:  423 QTC Calculation: 473 R Axis:   -74 Text Interpretation: Sinus or ectopic atrial rhythm Probable left atrial enlargement Nonspecific IVCD with LAD LVH with secondary repolarization abnormality No significant change since last tracing Confirmed by Isla Pence [phone]) on 01/24/2022 1:09:56 PM  Radiology DG Chest 2 View  Result Date: 01/24/2022 CLINICAL DATA:  71 year old male with history of shortness of breath and weakness. EXAM: CHEST - 2 VIEW COMPARISON:  Chest x-ray 02/15/2021. FINDINGS: Left-sided internal jugular PermCath with tip terminating at the superior cavoatrial junction. Lung volumes are low. No consolidative airspace disease. No pleural effusions. No pneumothorax. No pulmonary nodule or mass noted. Pulmonary vasculature and the cardiomediastinal silhouette are within normal limits. Atherosclerotic calcifications in the thoracic aorta. IMPRESSION: 1. Low lung volumes without radiographic evidence of acute cardiopulmonary disease. 2. Aortic atherosclerosis. Electronically Signed   By: Vinnie Langton M.D.   On: 01/24/2022 11:38   MR BRAIN WO CONTRAST  Result Date: 01/24/2022 CLINICAL DATA:   Neuro deficit with acute stroke suspected EXAM: MRI HEAD WITHOUT CONTRAST TECHNIQUE: Multiplanar, multiecho pulse sequences of the brain and surrounding structures were obtained without intravenous contrast. COMPARISON:  Head CT 09/03/2018 and brain MRI 05/23/2015 FINDINGS: Brain: Subcentimeter restricted diffusion in the right corona radiata. Chronic blood products at a remote low right parietal cortex infarct which is small to moderate size. Confluent chronic small vessel ischemia in the hemispheric white matter. Innumerable small cerebellar infarcts remotely. Brain atrophy with ventriculomegaly. No obstructive hydrocephalus or extra-axial collection. Vascular: Major flow voids are preserved Skull and upper cervical spine: Normal marrow signal Sinuses/Orbits: Right cataract resection IMPRESSION: 1. Acute lacunar infarct in the right centrum semiovale. 2. Severe chronic ischemic injury and atrophy that has progressed from 2016. Electronically Signed   By: Jorje Guild M.D.   On: 01/24/2022 12:42    Procedures .Critical Care Performed by: Antonietta Breach, PA-C Authorized by: Antonietta Breach, PA-C   Critical care provider statement:    Critical care time (minutes):  35   Critical care time was exclusive of:  Separately billable procedures and treating other patients   Critical care was necessary to treat or prevent imminent or life-threatening deterioration of the following conditions:  CNS failure or compromise and metabolic crisis (Acute CVA)   Critical care was time spent personally by me on the following activities:  Development of treatment plan with patient or surrogate, discussions with consultants, evaluation of patient's response to treatment, examination of patient, ordering and review of laboratory studies, ordering and review of radiographic studies, ordering and performing treatments and interventions, pulse oximetry, re-evaluation of patient's condition, review of old charts and obtaining  history from patient or surrogate   I assumed direction of critical care for this patient from another provider in my specialty: no     Care discussed with: admitting provider      Medications Ordered in ED Medications - No data to display  ED Course/ Medical Decision Making/ A&P Clinical Course as of 01/24/22 1402  Sat Jan 24, 2022  1058 Call placed to wife for collateral information; no answer at number provided. [XB]  1478 RN informed myself of repeat BP readings. Per  chart review, patient appears to historically have a BP of 072-182 systolic at baseline in our system since 2020. This is likely exacerbated by his inability to complete dialysis today. [EQ]  3374 MRI IMPRESSION: 1. Acute lacunar infarct in the right centrum semiovale. 2. Severe chronic ischemic injury and atrophy that has progressed from 2016.  [UZ]  1460 Consultation placed to Dr. Rory Percy of Neurology who has reviewed the MRI and agrees with acute CVA findings. Neurology to see in consultation. Will admit to medicine. [QN]  9987 MD Rory Percy at bedside. [AJ]  5872 BMBOM with IM teaching service who will assess for admission. [QT]  9276 Re-attempted contact with wife at home number listed (which patient confirms) with no answer. No ability to leave voicemail message. [KH]    Clinical Course User Index [KH] Antonietta Breach, PA-C                           Medical Decision Making Amount and/or Complexity of Data Reviewed Labs: ordered. Radiology: ordered. ECG/medicine tests: ordered.  Risk Decision regarding hospitalization.   71 year old male presenting to the emergency department from dialysis.  Was unable to be dialyzed as he developed acute onset of weakness.  He reports this to have been most notable to his right side with associated paresthesias.  States that his weakness and paresthesias have since resolved.  Patient not appreciated to have any focal deficits on my examination.  His work-up in the ED is notable for an  acute lacunar CVA.  The patient has been assessed by neurology who will consult on this patient's case.  Plan for admission to the internal medicine teaching service.  He does also have evidence of metabolic acidosis.  This is favored to be secondary to his renal failure/uremia, especially given that he was not dialyzed today. Otherwise awake, alert, hemodynamically stable at this time.        Final Clinical Impression(s) / ED Diagnoses Final diagnoses:  Acute CVA (cerebrovascular accident) [facility])  Stage 5 chronic kidney disease on chronic dialysis [facility])    Rx / DC Orders ED Discharge Orders     None         Antonietta Breach, PA-C 01/24/22 1405    Isla Pence, MD 01/24/22 1450

## 2022-01-24 NOTE — Hospital Course (Addendum)
Acute ischemic stroke ?Patient presented from HD and was found to have acute lacunar infarct in the right centrum semiovale and severe chronic ischemic injury and atrophy that is progressed from 2016 on brain MRI. He was very hypertensive with BP max 212/81. TSH WNL, HbA1c 5.9%, LDL above goal at 75. Echocardiogram was limited and incomplete due to patient being uncooperative with study. What was able to be obtained showed severe concentric left ventricular hypertrophy, severe mitral annular calcification, moderate calcification of the aortic valve with moderate thickening of the valve. Carotid ultrasound only able to be done on the L because of lack of cooperation and showed 1-39% stenosis. He had some agitation and verbal aggression which has resolved at time of discharge. PT/OT was consulted with recommendation for home health OT. Neurological status was at baseline at time of discharge. ?  ?ESRD on HD TTSa ?Anion gap metabolic acidosis ?Metabolic encephalopathy ?Asterixis, resolved ?Patient presented from HD where he did not complete full session. Admission labs were concerning for uremia, low bicarbonate, and increased anion gap. He had asterixis and ammonia was normal. Nephrology was consulted for assistance with HD and magnesium and phosphorous labs were collected which were normal. Lyrica was held on admission given concerns that it was leading to asterixis. He underwent HD overnight 03/11.  ?  ?Hx type 2 diabetes mellitus ?Bilateral AKA ?HbA1c 5.9% and patient's wife confirmed that he is not using Januvia at home. CBG appropriate throughout admission. ?  ?Hx HTN ?Permissive hypertension given new stroke during this admission. He was restarted on home amlodipine 5 mg.  ?  ?Hx HLD ?Chronic condition. Lipid panel revealed LDL above goal at 75. Continued home atorvastatin 40 mg daily. ?

## 2022-01-25 ENCOUNTER — Inpatient Hospital Stay (HOSPITAL_COMMUNITY): Payer: Medicare (Managed Care)

## 2022-01-25 DIAGNOSIS — I6381 Other cerebral infarction due to occlusion or stenosis of small artery: Secondary | ICD-10-CM

## 2022-01-25 DIAGNOSIS — I6389 Other cerebral infarction: Secondary | ICD-10-CM

## 2022-01-25 LAB — BASIC METABOLIC PANEL
Anion gap: 13 (ref 5–15)
BUN: 16 mg/dL (ref 8–23)
CO2: 25 mmol/L (ref 22–32)
Calcium: 8.7 mg/dL — ABNORMAL LOW (ref 8.9–10.3)
Chloride: 97 mmol/L — ABNORMAL LOW (ref 98–111)
Creatinine, Ser: 4.04 mg/dL — ABNORMAL HIGH (ref 0.61–1.24)
GFR, Estimated: 15 mL/min — ABNORMAL LOW (ref >60)
Glucose, Bld: 168 mg/dL — ABNORMAL HIGH (ref 70–99)
Potassium: 3.6 mmol/L (ref 3.5–5.1)
Sodium: 135 mmol/L (ref 135–145)

## 2022-01-25 LAB — GLUCOSE, CAPILLARY
Glucose-Capillary: 103 mg/dL — ABNORMAL HIGH (ref 70–99)
Glucose-Capillary: 105 mg/dL — ABNORMAL HIGH (ref 70–99)
Glucose-Capillary: 155 mg/dL — ABNORMAL HIGH (ref 70–99)
Glucose-Capillary: 191 mg/dL — ABNORMAL HIGH (ref 70–99)

## 2022-01-25 LAB — ECHOCARDIOGRAM LIMITED
S' Lateral: 3.6 cm
Weight: 1763.68 oz

## 2022-01-25 LAB — CBC
HCT: 37.6 % — ABNORMAL LOW (ref 39.0–52.0)
Hemoglobin: 12.3 g/dL — ABNORMAL LOW (ref 13.0–17.0)
MCH: 30.3 pg (ref 26.0–34.0)
MCHC: 32.7 g/dL (ref 30.0–36.0)
MCV: 92.6 fL (ref 80.0–100.0)
Platelets: 160 10*3/uL (ref 150–400)
RBC: 4.06 MIL/uL — ABNORMAL LOW (ref 4.22–5.81)
RDW: 15 % (ref 11.5–15.5)
WBC: 5.5 10*3/uL (ref 4.0–10.5)
nRBC: 0 % (ref 0.0–0.2)

## 2022-01-25 MED ORDER — HYDROXYZINE HCL 10 MG PO TABS
10.0000 mg | ORAL_TABLET | Freq: Once | ORAL | Status: AC | PRN
  Administered 2022-01-25: 10 mg via ORAL
  Filled 2022-01-25: qty 1

## 2022-01-25 MED ORDER — DIPHENHYDRAMINE-ZINC ACETATE 2-0.1 % EX CREA
TOPICAL_CREAM | Freq: Once | CUTANEOUS | Status: AC | PRN
  Filled 2022-01-25: qty 28

## 2022-01-25 MED ORDER — DIAZEPAM 5 MG/ML IJ SOLN
5.0000 mg | Freq: Once | INTRAMUSCULAR | Status: AC | PRN
  Administered 2022-01-25: 5 mg via INTRAVENOUS
  Filled 2022-01-25: qty 2

## 2022-01-25 MED ORDER — CLOPIDOGREL BISULFATE 75 MG PO TABS
75.0000 mg | ORAL_TABLET | Freq: Every day | ORAL | Status: DC
  Administered 2022-01-25 – 2022-01-27 (×3): 75 mg via ORAL
  Filled 2022-01-25 (×3): qty 1

## 2022-01-25 MED ORDER — INSULIN ASPART 100 UNIT/ML IJ SOLN
0.0000 [IU] | Freq: Three times a day (TID) | INTRAMUSCULAR | Status: DC
  Administered 2022-01-25: 1 [IU] via SUBCUTANEOUS
  Administered 2022-01-26: 2 [IU] via SUBCUTANEOUS
  Administered 2022-01-26: 1 [IU] via SUBCUTANEOUS

## 2022-01-25 NOTE — Progress Notes (Addendum)
VASCULAR LAB ? ? ? ?Limited carotid duplex has been performed.  Unable to scan right carotid secondary to patient's confusion and noncompliance. ? ?See CV proc for preliminary results. ? ? ?Haidyn Chadderdon, RVT ?01/25/2022, 8:53 AM ? ?

## 2022-01-25 NOTE — Evaluation (Signed)
Occupational Therapy Evaluation Patient Details Name: Garrett Salas MRN: [id number] DOB: 05-Jun-1951 Today's Date: 01/25/2022   History of Present Illness Patient is a 71 y/o male who presents on 3/11 from dialysis due to weakness, fatigue and right sided numbness. Brain MRI- small lacunar infarct right centrum semiovale. PMH includes bil BKAs, ESRD on Hd TTS, PVD, DM.   Clinical Impression   Alivin was evaluated sp the above admission list, he reports his wife assists him as needed at baseline, he transfers to a wc for meals, uses depends for toileting and has assist for peri hygiene and dressing. His wife works and pt is alone at home during the day. He has not been able to don his bilat prosthetics due to wounds on his residual limbs. Pt completed with PT, pt required max A +2 to get from supine to EOB and had poor sitting balance needing min A. He is visually impaired at baseline and required set up for self feeding and cues. Overall he requires up to max A for ADLs He is likely close to his functional baseline.. Pt will benefit from OT acutely. Recommenced d/c back to home with [place].      Recommendations for follow up therapy are one component of a multi-disciplinary discharge planning process, led by the attending physician.  Recommendations may be updated based on patient status, additional functional criteria and insurance authorization.   Follow Up Recommendations  Home health OT    Assistance Recommended at Discharge Intermittent Supervision/Assistance  Patient can return home with the following A little help with walking and/or transfers;A little help with bathing/dressing/bathroom;Direct supervision/assist for medications management;Assist for transportation;Help with stairs or ramp for entrance    Functional Status Assessment  Patient has had a recent decline in their functional status and demonstrates the ability to make significant improvements in function in a reasonable and  predictable amount of time.  Equipment Recommendations  None recommended by OT       Precautions / Restrictions Precautions Precautions: Fall Precaution Comments: bilat BKA, legally blind Required Braces or Orthoses: Other Brace Other Brace: bilat prosthesis, not in the room- has not worn for 2 years per report Restrictions Weight Bearing Restrictions: No      Mobility Bed Mobility Overal bed mobility: Needs Assistance Bed Mobility: Sidelying to Sit   Sidelying to sit: Max assist, +2 for physical assistance, +2 for safety/equipment       General bed mobility comments: assist of 2 required to elevate trunk and scoot hips toward EOB    Transfers                   General transfer comment: deferred      Balance Overall balance assessment: Needs assistance Sitting-balance support: Feet supported Sitting balance-Leahy Scale: Poor Sitting balance - Comments: minA for sitting balance                                   ADL either performed or assessed with clinical judgement   ADL Overall ADL's : Needs assistance/impaired Eating/Feeding: Sitting;Minimal assistance Eating/Feeding Details (indicate cue type and reason): min A for sitting balance, set up and cues for vision Grooming: Minimal assistance;Sitting   Upper Body Bathing: Moderate assistance;Sitting   Lower Body Bathing: Maximal assistance;Sit to/from stand   Upper Body Dressing : Minimal assistance;Sitting   Lower Body Dressing: Maximal assistance;Sitting/lateral leans   Toilet Transfer: Maximal assistance;Transfer board;BSC/3in1   Toileting-  Clothing Manipulation and Hygiene: Maximal assistance;Sitting/lateral lean       Functional mobility during ADLs: Moderate assistance (at bed level) General ADL Comments: Pt limited by lethargy this session; required assist for poor sitting balance, impaired vision and poor activity tolerance     Vision Baseline Vision/History: 2 Legally  blind Ability to See in Adequate Light: 1 Impaired Patient Visual Report: No change from baseline Vision Assessment?: Vision impaired- to be further tested in functional context Additional Comments: difficult to assess, per pt he has poor vision at baseline. When asked what happened to his eye sight he stated "[place]"     Perception     Praxis      Pertinent Vitals/Pain Pain Assessment Pain Assessment: Faces Faces Pain Scale: Hurts little more Pain Location: LEs with scooting Pain Descriptors / Indicators: Discomfort, Guarding Pain Intervention(s): Limited activity within patient's tolerance, Monitored during session     Hand Dominance Right   Extremity/Trunk Assessment Upper Extremity Assessment Upper Extremity Assessment: Defer to OT evaluation   Lower Extremity Assessment Lower Extremity Assessment: Generalized weakness (Bil BKAs, tightness into adduction bilaterally and knee flexion contractures. Wounds on BLEs.)   Cervical / Trunk Assessment Cervical / Trunk Assessment: Kyphotic   Communication Communication Communication: No difficulties   Cognition Arousal/Alertness: Awake/alert, Lethargic Behavior During Therapy: WFL for tasks assessed/performed Overall Cognitive Status: Impaired/Different from baseline Area of Impairment: Orientation, Attention, Memory, Following commands, Problem solving                 Orientation Level: Disoriented to, Place Current Attention Level: Sustained Memory: Decreased recall of precautions Following Commands: Follows one step commands with increased time     Problem Solving: Slow processing, Decreased initiation, Requires verbal cues       General Comments  VSS on RA, multiple wounds on bilat LEs    Exercises     Shoulder Instructions      Home Living Family/patient expects to be discharged to:: Private residence Living Arrangements: Spouse/significant other Available Help at Discharge: Family;Available  PRN/intermittently Type of Home: Mobile home Home Access: Ramped entrance     Home Layout: One level     Bathroom Shower/Tub: Teacher, early years/pre: Standard     Home Equipment: Wheelchair - Airline pilot (2 wheels);Hospital bed   Additional Comments: wife works      Prior Functioning/Environment Prior Level of Function : Needs assist             Mobility Comments: mobilizes in wc, lateral scoots with slideboard "whenever I can find one." ADLs Comments: has bilat prosthetics, unable to don 2/2 bilat wounds. It has been 2 years since he has walked. dresses indep. wife assists with bathing. wear depends, doesnot transfer on/offf toilet        OT Problem List: Decreased strength;Decreased range of motion;Decreased activity tolerance;Impaired balance (sitting and/or standing);Decreased safety awareness;Decreased cognition;Pain      OT Treatment/Interventions: Self-care/ADL training;Therapeutic exercise;Balance training;Patient/family education;Manual therapy    OT Goals(Current goals can be found in the care plan section) Acute Rehab OT Goals Patient Stated Goal: home OT Goal Formulation: With patient Time For Goal Achievement: 02/08/22 Potential to Achieve Goals: Good ADL Goals Pt Will Perform Eating: Independently;sitting Pt Will Perform Lower Body Dressing: with modified independence;sitting/lateral leans Pt Will Transfer to Toilet: with min assist;with transfer board;bedside commode Pt Will Perform Toileting - Clothing Manipulation and hygiene: with modified independence;sitting/lateral leans  OT Frequency: Min 2X/week    Co-evaluation PT/OT/SLP Co-Evaluation/Treatment: Yes Reason for  Co-Treatment: To address functional/ADL transfers;Necessary to address cognition/behavior during functional activity (refusing all things today) PT goals addressed during session: Mobility/safety with mobility;Balance OT goals addressed during session:  ADL's and self-care      AM-PAC OT "6 Clicks" Daily Activity     Outcome Measure Help from another person eating meals?: A Little Help from another person taking care of personal grooming?: A Little Help from another person toileting, which includes using toliet, bedpan, or urinal?: A Lot Help from another person bathing (including washing, rinsing, drying)?: A Lot Help from another person to put on and taking off regular upper body clothing?: A Little Help from another person to put on and taking off regular lower body clothing?: A Lot 6 Click Score: 15   End of Session Nurse Communication: Mobility status  Activity Tolerance:   Patient left: in bed;with call bell/phone within reach (sitting EOB eating with NT present)  OT Visit Diagnosis: Other abnormalities of gait and mobility (R26.89);Muscle weakness (generalized) (M62.81);History of falling (Z91.81);Pain                Time: 4174-0814 OT Time Calculation (min): 28 min Charges:  OT General Charges $OT Visit: 1 Visit OT Evaluation $OT Eval Moderate Complexity: 1 Mod   Jandy Brackens A Ifeoluwa Bartz 01/25/2022, 6:45 PM

## 2022-01-25 NOTE — Progress Notes (Signed)
Made Dean, DO aware that patient refused labs and refusing meds this morning. DO acknowledged and stated they will be by to see patient soon.  ?

## 2022-01-25 NOTE — Plan of Care (Signed)
Pt is alert oriented x 1-2. Pt spastic, yells out when touched or repositioned. Pt had HD. Pts wife called for update.  ? ? ?Problem: Education: ?Goal: Knowledge of General Education information will improve ?Description: Including pain rating scale, medication(s)/side effects and non-pharmacologic comfort measures ?Outcome: Progressing ?  ?Problem: Health Behavior/Discharge Planning: ?Goal: Ability to manage health-related needs will improve ?Outcome: Progressing ?  ?Problem: Clinical Measurements: ?Goal: Ability to maintain clinical measurements within normal limits will improve ?Outcome: Progressing ?Goal: Will remain free from infection ?Outcome: Progressing ?Goal: Diagnostic test results will improve ?Outcome: Progressing ?Goal: Respiratory complications will improve ?Outcome: Progressing ?Goal: Cardiovascular complication will be avoided ?Outcome: Progressing ?  ?Problem: Activity: ?Goal: Risk for activity intolerance will decrease ?Outcome: Progressing ?  ?Problem: Nutrition: ?Goal: Adequate nutrition will be maintained ?Outcome: Progressing ?  ?Problem: Coping: ?Goal: Level of anxiety will decrease ?Outcome: Progressing ?  ?Problem: Elimination: ?Goal: Will not experience complications related to bowel motility ?Outcome: Progressing ?Goal: Will not experience complications related to urinary retention ?Outcome: Progressing ?  ?Problem: Pain Managment: ?Goal: General experience of comfort will improve ?Outcome: Progressing ?  ?Problem: Safety: ?Goal: Ability to remain free from injury will improve ?Outcome: Progressing ?  ?Problem: Skin Integrity: ?Goal: Risk for impaired skin integrity will decrease ?Outcome: Progressing ?  ?Problem: Education: ?Goal: Knowledge of disease or condition will improve ?Outcome: Progressing ?Goal: Knowledge of secondary prevention will improve (SELECT ALL) ?Outcome: Progressing ?Goal: Knowledge of patient specific risk factors will improve (INDIVIDUALIZE FOR PATIENT) ?Outcome:  Progressing ?  ?Problem: Self-Care: ?Goal: Ability to participate in self-care as condition permits will improve ?Outcome: Progressing ?Goal: Ability to communicate needs accurately will improve ?Outcome: Progressing ?  ?Problem: Ischemic Stroke/TIA Tissue Perfusion: ?Goal: Complications of ischemic stroke/TIA will be minimized ?Outcome: Progressing ?  ?

## 2022-01-25 NOTE — Evaluation (Signed)
Physical Therapy Evaluation ?Patient Details ?Name: Garrett Salas ?MRN: [id number] ?DOB: [birth year] ?Today's Date: 01/25/2022 ? ?History of Present Illness ? Patient is a 71 y/o male who presents on 3/11 from dialysis due to weakness, fatigue and right sided numbness. Brain MRI- small lacunar infarct right centrum semiovale. PMH includes bil BKAs, ESRD on Hd TTS, PVD, DM.  ?Clinical Impression ? Patient presents with generalized weakness, impaired balance, legally blind, and impaired mobility s/p above. Pt lives at home with wife in a mobile home and reports using w/c for mobility. Needs assist for transfers using slide board. Wife works so pt is home alone. Noted to have wounds on bil residual limbs. Reports he has not walked or donned prosthetics in 2 years. Today, pt requires Max A of 2 to sit EOB and scoot forwards to eat lunch. Min A progressing to min guard assist for balance EOB. Pt likely close to functional baseline based on reports. Will follow acutely to maximize independence/mobility and prevent further Salas deconditioning. ?   ? ?Recommendations for follow up therapy are one component of a multi-disciplinary discharge planning process, led by the attending physician.  Recommendations may be updated based on patient status, additional functional criteria and insurance authorization. ? ?Follow Up Recommendations No PT follow up ? ?  ?Assistance Recommended at Discharge Frequent or constant Supervision/Assistance  ?Patient can return home with the following ? A lot of help with walking and/or transfers;A lot of help with bathing/dressing/bathroom;Assist for transportation;Help with stairs or ramp for entrance;Direct supervision/assist for medications management;Assistance with cooking/housework;Direct supervision/assist for financial management ? ?  ?Equipment Recommendations None recommended by PT  ?Recommendations for Other Services ?    ?  ?Functional Status Assessment Patient has not had a recent  decline in their functional status  ? ?  ?Precautions / Restrictions Precautions ?Precautions: Fall ?Precaution Comments: bilat BKA, legally blind ?Required Braces or Orthoses: Other Brace ?Other Brace: bilat prosthesis, not in the room- has not worn for 2 years per report ?Restrictions ?Weight Bearing Restrictions: No  ? ?  ? ?Mobility ? Bed Mobility ?Overal bed mobility: Needs Assistance ?Bed Mobility: Sidelying to Sit ?  ?Sidelying to sit: Max assist, +2 for physical assistance, HOB elevated ?  ?  ?  ?General bed mobility comments: Assist of 2 to get to EOB and to scoot bottom reciprocally forward with cues for sequencing/technique. Reaching for rail with LUE. ?  ? ?Transfers ?  ?  ?  ?  ?  ?  ?  ?  ?  ?General transfer comment: Deferred ?  ? ?Ambulation/Gait ?  ?  ?  ?  ?  ?  ?  ?  ? ?Stairs ?  ?  ?  ?  ?  ? ?Wheelchair Mobility ?  ? ?Modified Rankin (Stroke Patients Only) ?Modified Rankin (Stroke Patients Only) ?Pre-Morbid Rankin Score: Severe disability ?Modified Rankin: Severe disability ? ?  ? ?Balance Overall balance assessment: Needs assistance ?Sitting-balance support: Feet unsupported, Single extremity supported ?Sitting balance-Leahy Scale: Poor ?Sitting balance - Comments: Requires min A progressing to Min guard assist with pillows supporting back; UE support ?  ?  ?  ?  ?  ?  ?  ?  ?  ?  ?  ?  ?  ?  ?  ?   ? ? ? ?Pertinent Vitals/Pain Pain Assessment ?Pain Assessment: Faces ?Faces Pain Scale: Hurts little more ?Pain Location: LEs with scooting ?Pain Descriptors / Indicators: Grimacing, Moaning ?Pain Intervention(s): Monitored during session  ? ? ?  Home Living Family/patient expects to be discharged to:: Private residence ?Living Arrangements: Spouse/significant other ?Available Help at Discharge: Family;Available PRN/intermittently ?Type of Home: Mobile home ?Home Access: Ramped entrance ?  ?  ?  ?Home Layout: One level ?Home Equipment: Wheelchair - Airline pilot (2 wheels);Salas  bed ?Additional Comments: wife works  ?  ?Prior Function Prior Level of Function : Needs assist ?  ?  ?  ?  ?  ?  ?Mobility Comments: mobilizes in wc, lateral scoots with slideboard "whenever I can find one." ?ADLs Comments: has bilat prosthetics, unable to don 2/2 bilat wounds. It has been 2 years since he has walked. dresses indep. wife assists with bathing. wear depends, doesnot transfer on/offf toilet ?  ? ? ?Hand Dominance  ? Dominant Hand: Right ? ?  ?Extremity/Trunk Assessment  ? Upper Extremity Assessment ?Upper Extremity Assessment: Defer to OT evaluation ?  ? ?Lower Extremity Assessment ?Lower Extremity Assessment: Generalized weakness (Bil BKAs, tightness into adduction bilaterally and knee flexion contractures. Wounds on BLEs.) ?  ? ?Cervical / Trunk Assessment ?Cervical / Trunk Assessment: Kyphotic  ?Communication  ? Communication: No difficulties  ?Cognition Arousal/Alertness: Awake/alert, Lethargic ?Behavior During Therapy: [facility] for tasks assessed/performed ?Overall Cognitive Status: Impaired/Different from baseline ?Area of Impairment: Orientation, Attention, Memory, Following commands, Problem solving ?  ?  ?  ?  ?  ?  ?  ?  ?Orientation Level: Disoriented to, Place, Time ?Current Attention Level: Sustained ?Memory: Decreased recall of precautions ?Following Commands: Follows one step commands with increased time ?  ?  ?Problem Solving: Slow processing, Decreased initiation, Requires verbal cues ?General Comments: Initially going in and out, falling asleep. ?  ?  ? ?  ?General Comments   ? ?  ?Exercises    ? ?Assessment/Plan  ?  ?PT Assessment Patient needs continued PT services  ?PT Problem List Decreased range of motion;Decreased strength;Decreased mobility;Decreased skin integrity;Pain;Decreased balance;Decreased activity tolerance ? ?   ?  ?PT Treatment Interventions Therapeutic exercise;Wheelchair mobility training;Balance training;Patient/family education;Therapeutic activities;Functional  mobility training;Neuromuscular re-education   ? ?PT Goals (Current goals can be found in the Care Plan section)  ?Acute Rehab PT Goals ?Patient Stated Goal: eat ?PT Goal Formulation: With patient ?Time For Goal Achievement: 02/08/22 ?Potential to Achieve Goals: Fair ? ?  ?Frequency Min 2X/week ?  ? ? ?Co-evaluation PT/OT/SLP Co-Evaluation/Treatment: Yes ?Reason for Co-Treatment: To address functional/ADL transfers;Necessary to address cognition/behavior during functional activity (refusing all things today) ?PT goals addressed during session: Mobility/safety with mobility;Balance ?OT goals addressed during session: ADL's and self-care ?  ? ? ?  ?AM-PAC PT "6 Clicks" Mobility  ?Outcome Measure Help needed turning from your back to your side while in a flat bed without using bedrails?: A Lot ?Help needed moving from lying on your back to sitting on the side of a flat bed without using bedrails?: Total ?Help needed moving to and from a bed to a chair (including a wheelchair)?: Total ?Help needed standing up from a chair using your arms (e.g., wheelchair or bedside chair)?: Total ?Help needed to walk in Salas room?: Total ?Help needed climbing 3-5 steps with a railing? : Total ?6 Click Score: 7 ? ?  ?End of Session   ?Activity Tolerance: Patient tolerated treatment well ?Patient left: in bed;with call bell/phone within reach;with nursing/sitter in room (sitting EOB eating lunch) ?Nurse Communication: Mobility status;Other (comment) (sitting EOB eating) ?PT Visit Diagnosis: Muscle weakness (generalized) (M62.81) ?  ? ?Time: 4580-9983 ?PT Time Calculation (min) (ACUTE ONLY): 28 min ? ? ?  Charges:   PT Evaluation ?$PT Eval Moderate Complexity: 1 Mod ?  ?  ?   ? ? ?Marisa Severin, PT, DPT ?Acute Rehabilitation Services ?Pager [phone] ?Office [phone] ? ? ? ? ?[place] ?01/25/2022, 4:17 PM ? ?

## 2022-01-25 NOTE — Progress Notes (Signed)
Patient refused to turn during MRI therefore they were unable to scan patient, made Charlean Merl, NP aware and she ordered PRN. Patient refused for labs to be drawn at this time as well. Will notify MD. Patient refusing medications at this time and refusing to turn off his right side.  ?

## 2022-01-25 NOTE — Progress Notes (Signed)
PT Cancellation Note ? ?Patient Details ?Name: Garrett Salas [facility] ?MRN: [id number] ?DOB: October 28, 1951 ? ? ?Cancelled Treatment:    Reason Eval/Treat Not Completed: Patient declined, no reason specified Pt off floor for HD. Will follow. ? ? ?[facility] ?01/25/2022, 9:23 AM ?Marisa Severin, PT, DPT ?Acute Rehabilitation Services ?Pager [phone] ?Office [phone] ? ? ? ?

## 2022-01-25 NOTE — Progress Notes (Signed)
[facility] ?Progress Note ? ?Subjective: seen in room, no c/o ? ?Vitals:  ? 01/24/22 2305 01/24/22 2335 01/25/22 0005 01/25/22 0408  ?BP: (!) 112/40 (!) 132/36 (!) 121/56 (!) 166/48  ?Pulse:    69  ?Resp: '12 14 10 16  '$ ?Temp:    97.8 ?F (36.6 ?C)  ?TempSrc:    Oral  ?SpO2:    100%  ?Weight:      ? ? ?Exam: ?Gen alert, poor vision, responsive ?No jvd or bruits ?Chest clear bilat to bases ?RRR no RG ?Abd soft ntnd no mass or ascites +bs ?MS R hand finger amps ?Ext no edema, bilat BKA ?Neuro alert, Ox 3 , nf ?  RIJ TDC intact ?  ?   ? Home meds include - norvasc 5, lipitor, auryxia 2 ac tid, lyrica 75 bid, januvia, norco prn, reglan 5 tid , prns/ vits / supps ?  ?  ?  ? OP HD: TTS [facility] ? 3.5h  2k/3Ca bath   43.3kg  P2  TDC  Hep none ? - rocaltrol 0.25 po tiw ? - no esa, last Hb 11 ? - last HD 3/09 ?  ?  ?Assessment/ Plan: ?Acute CVA - acute lacunar CVA per MRI.  Per pmd.  ?ESRD - on HD TTS.  Had HD last night / overnight here. Next HD 3/14.  ?Asterixis - mild, possibly uremia (but azotemia is not bad) vs Lyrica. Please hold Lyrica and avoid narcotics if possible.  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 Ca and phos in range.  ?  ?  ?  ? ? ? ? ?Rob Doctor, hospital ?01/25/2022, 8:12 AM ? ? ?Recent Labs  ?Lab 01/24/22 ?1139 01/24/22 ?1837 01/24/22 ?2123  ?HGB 12.5*  --   --   ?ALBUMIN 3.2*  --   --   ?CALCIUM 8.6*  --   --   ?PHOS  --  5.7* 5.7*  ?CREATININE 6.65*  --   --   ?K 4.5  --   --   ? ?Inpatient medications: ? amLODipine  5 mg Oral Daily  ? atorvastatin  40 mg Oral Daily  ? [START ON 01/27/2022] calcitRIOL  0.25 mcg Oral Q T,Th,Sa-HD  ? Chlorhexidine Gluconate Cloth  6 each Topical Q0600  ? ferric citrate  420 mg Oral TID WC  ? heparin  5,000 Units Subcutaneous Q8H  ? insulin aspart  0-6 Units Subcutaneous TID WC  ? ? ?acetaminophen **OR** acetaminophen, ondansetron **OR** ondansetron (ZOFRAN) IV,  senna-docusate ? ? ? ? ? ? ?

## 2022-01-25 NOTE — Progress Notes (Signed)
? ?HD#1 ?SUBJECTIVE:  ?Patient Summary: Garrett Salas. 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 ? ?Overnight Events: Patient has been agitated and refusing labs and vitals this morning. Further work-up for new CVA including carotid ultrasound and MRA head have been affected by this. ? ?Interim History: I spoke with the patient's wife, Garrett Salas, who says that here lately he has had some agitation recently and that when he is in pain from his bilateral BKA it is worse. She intends to visit soon and would like to try to talk to him by phone in the meantime. ? ?OBJECTIVE:  ?Vital Signs: ?Vitals:  ? 01/24/22 2335 01/25/22 0005 01/25/22 0408 01/25/22 1006  ?BP: (!) 132/36 (!) 121/56 (!) 166/48 (!) 187/60  ?Pulse:   69 67  ?Resp: '14 10 16 20  '$ ?Temp:   97.8 ?F (36.6 ?C) 97.7 ?F (36.5 ?C)  ?TempSrc:   Oral Oral  ?SpO2:   100% 100%  ?Weight:      ? ?Supplemental O2: Room Air ?SpO2: 100 % ? ?Filed Weights  ? 01/24/22 2105  ?Weight: 50 kg  ? ? ?No intake or output data in the 24 hours ending 01/25/22 1222 ?Net IO Since Admission: No IO data has been entered for this period [01/25/22 1222] ? ?Physical Exam: ?Constitutional: Chronically ill appearing gentleman laying in bed. No acute distress. ?Cardio:Regular rate and rhythm. Systolic murmur noted. ?Pulm:Normal work of breathing on room air. ?NPY:YFRTMYTRZ BKA with wound on R AKA, as shown below. ?Skin:Warm and dry. ?Neuro: Alert but sleepy, oriented to self but not to place. Recalls his wife's name. Does not know why he is here. Is easily agitated and jumps at most touch. Yells out in frustration. ?Psych:Agitated. ? ? ? ? ?Patient Lines/Drains/Airways Status   ? ? Active Line/Drains/Airways   ? ? Name Placement date Placement time Site Days  ? Peripheral IV 20 G Posterior;Right Hand --  --  Hand  --  ? Fistula / Graft Right  Upper arm --  --  Upper arm  --  ? Hemodialysis Catheter Left Internal jugular Double lumen Permanent (Tunneled) 05/24/19  0937  Internal jugular  977  ? Incision (Closed) 05/16/15 Leg Left 05/16/15  1426  -- 2446  ? Incision (Closed) 05/24/19 Chest Left 05/24/19  0952  -- 977  ? Incision (Closed) 05/24/19 Arm Right 05/24/19  0952  -- 977  ? Incision (Closed) 06/26/19 Hand Right 06/26/19  1112  -- 944  ? Wound / Incision (Open or Dehisced) 01/24/22 Non-pressure wound Knee Anterior;Right;Posterior non healing wound 01/24/22  1855  Knee  1  ? Wound / Incision (Open or Dehisced) 01/24/22 Non-pressure wound Knee Anterior;Left;Posterior;Lower non healing wound 01/24/22  1855  Knee  1  ? ?  ?  ? ?  ? ? ? ?ASSESSMENT/PLAN:  ?Assessment: ?Principal Problem: ?  Lacunar infarct, acute ([place]) ? ? ?Plan: ?Acute ischemic stroke ?Patient has had agitation this morning including yelling when touched, refusing lab draws and vitals, and not cooperating with movement necessary for correct body imaging for MRA brain and carotid ultrasound. TSH WNL, HbA1c pending, LDL above goal at 75. Echocardiogram obtained today limited and incomplete due to patient being uncooperative with study. What was able to be obtained showed severe concentric left ventricular hypertrophy, severe mitral annular calcification, moderate calcification of the aortic valve with moderate thickening of the  valve. Today he is agitated which is different from admission when he was pleasantly confused; at this time he continues to refuse labs and vitals and allows very minimal physical examination. He did receive a dose of Valium earlier in the day to help with anxiety during MRA. ?-Neurology following, appreciate their assistance ?-Echocardiogram and carotid ultrasound will likely need to be redone when confusion/agitation are improved. ?-F/u HbA1c ?-Hold lyrica and other narcotics ?-PT/OT consulted ? 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 phosphorous elevated at 5.7. Unfortunately he has been refusing lab draws so I am unable to trend renal function from admission at this time. Asterixis has resolved at this time. Lyrica held per nephrology at admission. Underwent HD 03/11 overnight. ?-Nephrology consulted, appreciate their assistance ? ?Hx type 2 diabetes mellitus ?Bilateral AKA ?HbA1c pending at this time, last noted to be 6.1% in July 2022. CBG have been appropriate.  ?-CBG per unit protocol ?-F/u HbA1c ? ?Hx HTN ?HTN with restarting of home amlodipine 5. ?-Permissive hypertension <220/120 okay but should normalize over next several days. ?-Continue home amlodipine 5 mg daily ? ?Hx HLD ?Lipid panel revealed LDL above goal at 75. Currently managed outpatient with atorvastatin 40 mg daily. ?-Continue home atorvastatin 40 mg daily ? ?Best Practice: ?Diet: Renal diet ?IVF: None ?VTE: heparin injection 5,000 Units Start: 01/24/22 1530 ?Code: DNR ?AB: None ?DISPO: Anticipated discharge in 2-3 days pending Medical stability. ? ?Signature: ?Farrel Gordon, D.O.  ?Internal Medicine Resident, PGY-1 ?[facility]  ?Pager: [phone] ?12:22 PM, 01/25/2022  ? ?Please contact the on call pager after 5 pm and on weekends at [phone]. ? ?

## 2022-01-25 NOTE — Progress Notes (Addendum)
STROKE TEAM PROGRESS NOTE  ? ?INTERVAL HISTORY ?No family at the bedside. Garrett Salas just returned from MRI, unable to complete scan due to patients refusal. Garrett Salas is laying on his side. Moves all extremities. Uncooperative with physical exam.  ? ?Vitals:  ? 01/24/22 2305 01/24/22 2335 01/25/22 0005 01/25/22 0408  ?BP: (!) 112/40 (!) 132/36 (!) 121/56 (!) 166/48  ?Pulse:    69  ?Resp: '12 14 10 16  '$ ?Temp:    97.8 ?F (36.6 ?C)  ?TempSrc:    Oral  ?SpO2:    100%  ?Weight:      ? ?CBC:  ?Recent Labs  ?Lab 01/24/22 ?1139  ?WBC 6.3  ?NEUTROABS 4.5  ?HGB 12.5*  ?HCT 38.4*  ?MCV 92.1  ?PLT 143*  ? ?Basic Metabolic Panel:  ?Recent Labs  ?Lab 01/24/22 ?1139 01/24/22 ?1837 01/24/22 ?2123  ?NA 131*  --   --   ?K 4.5  --   --   ?CL 94*  --   --   ?CO2 18*  --   --   ?GLUCOSE 170*  --   --   ?BUN 41*  --   --   ?CREATININE 6.65*  --   --   ?CALCIUM 8.6*  --   --   ?MG  --  2.1  --   ?PHOS  --  5.7* 5.7*  ? ?Lipid Panel:  ?Recent Labs  ?Lab 01/24/22 ?1837  ?CHOL 128  ?TRIG 47  ?HDL 44  ?CHOLHDL 2.9  ?VLDL 9  ?[place] 75  ? ?HgbA1c: No results for input(s): HGBA1C in the last 168 hours. ?Urine Drug Screen: No results for input(s): LABOPIA, COCAINSCRNUR, LABBENZ, AMPHETMU, THCU, LABBARB in the last 168 hours.  ?Alcohol Level No results for input(s): ETH in the last 168 hours. ? ?IMAGING past 24 hours ?DG Chest 2 View ? ?Result Date: 01/24/2022 ?CLINICAL DATA:  71 year old male with history of shortness of breath and weakness. EXAM: CHEST - 2 VIEW COMPARISON:  Chest x-ray 02/15/2021. FINDINGS: Left-sided internal jugular PermCath with tip terminating at the superior cavoatrial junction. Lung volumes are low. No consolidative airspace disease. No pleural effusions. No pneumothorax. No pulmonary nodule or mass noted. Pulmonary vasculature and the cardiomediastinal silhouette are within normal limits. Atherosclerotic calcifications in the thoracic aorta. IMPRESSION: 1. Low lung volumes without radiographic evidence of acute cardiopulmonary disease.  2. Aortic atherosclerosis. Electronically Signed   By: Vinnie Langton M.D.   On: 01/24/2022 11:38  ? ?MR BRAIN WO CONTRAST ? ?Result Date: 01/24/2022 ?CLINICAL DATA:  Neuro deficit with acute stroke suspected EXAM: MRI HEAD WITHOUT CONTRAST TECHNIQUE: Multiplanar, multiecho pulse sequences of the brain and surrounding structures were obtained without intravenous contrast. COMPARISON:  Head CT 09/03/2018 and brain MRI 05/23/2015 FINDINGS: Brain: Subcentimeter restricted diffusion in the right corona radiata. Chronic blood products at a remote low right parietal cortex infarct which is small to moderate size. Confluent chronic small vessel ischemia in the hemispheric white matter. Innumerable small cerebellar infarcts remotely. Brain atrophy with ventriculomegaly. No obstructive hydrocephalus or extra-axial collection. Vascular: Major flow voids are preserved Skull and upper cervical spine: Normal marrow signal Sinuses/Orbits: Right cataract resection IMPRESSION: 1. Acute lacunar infarct in the right centrum semiovale. 2. Severe chronic ischemic injury and atrophy that has progressed from 2016. Electronically Signed   By: Jorje Guild M.D.   On: 01/24/2022 12:42   ? ?PHYSICAL EXAM ? ?Physical Exam  ? ?Cardiovascular: Normal rate and regular rhythm.  ?Respiratory: Effort normal, non-labored breathing ? ?Neuro: ?Mental  Status: Garrett Salas is curled in bed laying on his right side.  When attempting to examine the Garrett Salas Garrett Salas yelled in pain and wanted to be left alone.  Garrett Salas was uncooperative. ?Garrett Salas is awake, alert, uncooperative with questions  ?Cranial Nerves: ?II: uncooperative with exams ?VII: Facial movement appears symmetric ?VIII: Hearing is intact to voice ?Ext: Bilateral amputation and lower extremity ? ? ? ?ASSESSMENT/PLAN ?Garrett Salas is a 71 y.o. male with history of  ESRD on dialysis, diabetic retinopathy with legal blindness, diabetic neuropathy, diabetes hypertension, hyperlipidemia, peripheral vascular  disease, status post bilateral BKA's presenting to the emergency room from dialysis which Garrett Salas was unable to complete because Garrett Salas was not feeling well. Tingling, numbness on the right side and generalized weakness. MRA head pending.  Agitation when assessed, Garrett Salas has refused lab draws, vital signs, and medications.  Garrett Salas did have dialysis overnight on 3/11.  Nephrology following ? ?Stroke:  Lacunar infarct in the right centrum semiovale likely secondary small vessel disease source ?MRI  Acute lacunar infarct in the right centrum semiovale. ?MRA Negative intracranial MRA ?2D Echo Incomplete study, left ventricular hypertrophy ?LDL 75 ?HgbA1c No results found for requested labs within last 26280 hours. ?VTE prophylaxis - heparin subq ?   ?Diet  ? Diet renal with fluid restriction Fluid restriction: 1200 mL Fluid; Room service appropriate? Yes; Fluid consistency: Thin  ? ?No antithrombotic prior to admission, now on clopidogrel 75 mg daily.  ?Therapy recommendations:  pending ?Disposition:  pending ? ?Hypertension ?Home meds:  amlodipine ?Stable ?Permissive hypertension (OK if < 220/120) but gradually normalize in 5-7 days ?Long-term BP goal normotensive ? ?Hyperlipidemia ?Home meds:  Atorvastatin '40mg'$ , resumed in hospital ?LDL 75, goal < 70 ?Continue statin at discharge ? ?Diabetes type II Controlled ?Home meds:  Januvia ?HgbA1c No results found for requested labs within last 26280 hours., goal < 7.0 ?CBGs ?Recent Labs  ?  01/24/22 ?1503 01/25/22 ?0646  ?GLUCAP 133* 103*  ?  ?SSI ? ?Other Stroke Risk Factors ?Advanced Age >/= 64  ?Cigarette smoker, advised to stop smoking ?Substance abuse - UDS:  THC No results found for requested labs within last 26280 hours., Cocaine No results found for requested labs within last 26280 hours.. Garrett Salas advised to stop using due to stroke risk. ? ? ?Other Active Problems ?ESRD on dialysis TTS ?Nephrology consulted ?Anemia ?Hgb 12 ?Asterixis- uremia vs lyrica, per nephrology hold lyrica  and narcotics ? ?Hospital day # 1 ? ?Garrett Salas seen and examined by NP/APP with MD. MD to update note as needed.  ? ?Janine Ores, DNP, FNP-BC ?[facility] ?Pager: [phone] ? ?ATTENDING ATTESTATION: ? ?71 year old past medical history of end-stage renal disease on dialysis diabetic retinopathy legally blind, diabetic neuropathy, hypertension peripheral vascular disease bilateral BKA's Garrett Salas has a small lacunar infarct in right centrum semiovale.  Garrett Salas refused examination and was uncooperative.  Garrett Salas is also refusing labs and medications.  MRA brain neg. Work-up as above. ? ?Neurology to sign off. Please call with questions.  ? ?Dr. Reeves Forth evaluated pt independently, reviewed imaging, chart, labs. Discussed and formulated plan with the APP. Please see APP note above for details.   Total 36 minutes spent on counseling Garrett Salas and coordinating care, writing notes and reviewing chart. ? ? ?Christe Tellez,MD  ? ?To contact Stroke Continuity provider, please refer to [email or web address] ?After hours, contact General Neurology ? ?

## 2022-01-26 LAB — HEMOGLOBIN A1C
Hgb A1c MFr Bld: 5.9 % — ABNORMAL HIGH (ref 4.8–5.6)
Mean Plasma Glucose: 123 mg/dL

## 2022-01-26 LAB — GLUCOSE, CAPILLARY
Glucose-Capillary: 79 mg/dL (ref 70–99)
Glucose-Capillary: 218 mg/dL — ABNORMAL HIGH (ref 70–99)
Glucose-Capillary: 160 mg/dL — ABNORMAL HIGH (ref 70–99)
Glucose-Capillary: 91 mg/dL (ref 70–99)

## 2022-01-26 MED ORDER — PREGABALIN 50 MG PO CAPS: 50.0000 mg | ORAL_CAPSULE | Freq: Three times a day (TID) | ORAL | 1 refills | Status: DC

## 2022-01-26 NOTE — Progress Notes (Signed)
Scabs to both stumps cleansed and betadine applied. Tolerated well. ?

## 2022-01-26 NOTE — TOC CAGE-AID Note (Signed)
Transition of Care (TOC) - CAGE-AID Screening ? ? ?Patient Details  ?Name: Garrett Salas ?MRN: [id number] ?Date of Birth: 04-Mar-1951 ? ?Transition of Care (TOC) CM/SW Contact:    ?Jenita Rayfield C Tarpley-Carter, LCSWA ?Phone Number: ?01/26/2022, 2:50 PM ? ? ?Clinical Narrative: ?Pt participated in [place].  Pt stated he does use substance, but does not use ETOH.  Pt was offered resources, due to usage of substance. ? ?Passenger transport manager, MSW, LCSW-A ?Pronouns:  She/Her/Hers ?[facility] ?Clinical Social Worker ?Direct Number:  [phone] ?[email or web address]     ? ? ?CAGE-AID Screening: ?  ? ?Have You Ever Felt You Ought to Cut Down on Your Drinking or Drug Use?: No ?Have People Annoyed You By Critizing Your Drinking Or Drug Use?: No ?Have You Felt Bad Or Guilty About Your Drinking Or Drug Use?: No ?Have You Ever Had a Drink or Used Drugs First Thing In The Morning to Steady Your Nerves or to Get Rid of a Hangover?: No ?CAGE-AID Score: 0 ? ?Substance Abuse Education Offered: Yes ? ?Substance abuse interventions: Educational Materials ? ? ? ? ? ? ?

## 2022-01-26 NOTE — Progress Notes (Signed)
Very unpleasant during  shift report rounds, started cursing, saying  " get away from here,". Reported by NT pt  refused  V/s  to be taken. No amount of explanation to convince him to have it done. Continue to monitor. ?

## 2022-01-26 NOTE — Progress Notes (Signed)
[facility] ?Progress Note ? ?  ? OP HD: TTS [place] ? 3.5h  2k/3Ca bath   43.3kg  P2  TDC  Hep none ? - rocaltrol 0.25 po tiw ? - no esa, last Hb 11 ? - last HD 3/09 ?  ?  ?Assessment/ Plan: ?Acute CVA - acute lacunar CVA per MRI.  Per pmd.  ?ESRD - on HD TTS.  Next HD 3/14.  ?Asterixis - mild, possibly uremia (but azotemia is not bad) vs Lyrica. Please hold Lyrica and avoid narcotics if possible.  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 Ca and phos in range.  ?  ? ?Subjective: seen in room, no c/o, denies f/c/n/v/dyspnea ? ?Vitals:  ? 01/25/22 2016 01/25/22 2340 01/26/22 0338 01/26/22 0800  ?BP: (!) 137/30 (!) 128/100 (!) 146/127   ?Pulse: 62 70 73   ?Resp: '20 18 18   '$ ?Temp: 98 ?F (36.7 ?C) 97.7 ?F (36.5 ?C) 97.8 ?F (36.6 ?C)   ?TempSrc: Oral Axillary Axillary   ?SpO2: 100% 100% 98% 98%  ?Weight:      ? ? ?Exam: ?Gen alert, poor vision, responsive ?No jvd or bruits ?Chest clear bilat to bases ?RRR no RG ?Abd soft ntnd no mass or ascites +bs ?MS R hand finger amps ?Ext no edema, bilat BKA ?Neuro alert, Ox 3 , nf ?  RIJ TDC intact ?  ?   ? Home meds include - norvasc 5, lipitor, auryxia 2 ac tid, lyrica 75 bid, januvia, norco prn, reglan 5 tid , prns/ vits / supps ?  ? ? ?Recent Labs  ?Lab 01/24/22 ?1139 01/24/22 ?1837 01/24/22 ?2123 01/25/22 ?1755  ?HGB 12.5*  --   --  12.3*  ?ALBUMIN 3.2*  --   --   --   ?CALCIUM 8.6*  --   --  8.7*  ?PHOS  --  5.7* 5.7*  --   ?CREATININE 6.65*  --   --  4.04*  ?K 4.5  --   --  3.6  ? ?Inpatient medications: ? amLODipine  5 mg Oral Daily  ? atorvastatin  40 mg Oral Daily  ? [START ON 01/27/2022] calcitRIOL  0.25 mcg Oral Q T,Th,Sa-HD  ? Chlorhexidine Gluconate Cloth  6 each Topical Q0600  ? clopidogrel  75 mg Oral Daily  ? ferric citrate  420 mg Oral TID WC  ? heparin  5,000 Units Subcutaneous Q8H  ? insulin aspart  0-6 Units Subcutaneous TID WC  ? ? ?acetaminophen **OR**  acetaminophen, ondansetron **OR** ondansetron (ZOFRAN) IV, senna-docusate ? ? ? ? ? ? ?

## 2022-01-26 NOTE — Consult Note (Addendum)
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  ?Pt states he has been followed in the past by the outpatient wound care center, but the most recent note in the record was from 10/22. According to their progress notes, these wounds have been present for several years and were described as dry chronic ischemic wounds/callous areas from patient crawling on his stumps and not wearing his prosthesis. ?Wound type: ?Left anterior knee with full thickness chronic wound; 4X4cm, dry eschar, raised above the skin level.  No odor, drainage, or fluctuance. ?Right anterior knee with full thickness chronic wound; 3X2cm, dry eschar, raised above the skin level.  No odor, drainage, or fluctuance. ?Right lower stump with  full thickness chronic wound; 1X1cm, dry eschar, raised above the skin level.  No odor, drainage, or fluctuance. ?Dressing procedure/placement/frequency: It is best practice to leave dry stable eschar intact to lower extremity wounds. Topical treatment orders provided for bedside nurses to perform as follows: Apply Betadine with a swab to left and right stump wounds Q day, leave open to air. ?Please re-consult if further assistance is needed.  Thank-you,  ?Julien Girt MSN, RN, [place], [place], CNS ?[phone]  ? ?  ?

## 2022-01-26 NOTE — TOC Initial Note (Signed)
Transition of Care (TOC) - Initial/Assessment Note  ? ? ?Patient Details  ?Name: Other [facility] ?MRN: [id number] ?Date of Birth: 1951-03-07 ? ?Transition of Care (TOC) CM/SW Contact:    ?Pollie Friar, RN ?Phone Number: ?01/26/2022, 11:08 AM ? ?Clinical Narrative:                 ?Patient is from home with his spouse. He is ESRD and attends HD TTS in [place]. Pt has needed DME at home.  ?Recommendations for home health services after discharge. Pt agreeable and has no preference on [facility] agency. He did ask to talk with the agency. CM has arranged [place] with [facility] and they have called him over the phone.  ?Pt has an aide 3 times a week that assists with transportation. At d/c he states his wife will provide needed transport home.  ?TOC following. ? ?Expected Discharge Plan: [place] ?Barriers to Discharge: Continued Medical Work up ? ? ?Patient Goals and CMS Choice ?  ?CMS Medicare.gov Compare Post Acute Care list provided to:: Patient ?Choice offered to / list presented to : Patient ? ?Expected Discharge Plan and Services ?Expected Discharge Plan: [place] ?  ?Discharge Planning Services: CM Consult ?Post Acute Care Choice: Home Health ?Living arrangements for the past 2 months: [place] ?                ?  ?  ?  ?  ?  ?HH Arranged: Therapist, sports, OT ?[place] Agency: [facility] ([place]) ?Date HH Agency Contacted: 01/26/22 ?  ?Representative spoke with at [place]: Corene Cornea ? ?Prior Living Arrangements/Services ?Living arrangements for the past 2 months: [place] ?Lives with:: Spouse ?Patient language and need for interpreter reviewed:: Yes ?       ?  ?  ?Current home services: DME (hospital bed/ shower seat/ wheelchair) ?Criminal Activity/Legal Involvement Pertinent to Current Situation/Hospitalization: No - Comment as needed ? ?Activities of Daily Living ?Home Assistive Devices/Equipment: None ?ADL Screening (condition at time of admission) ?Patient's  cognitive ability adequate to safely complete daily activities?: No ?Is the patient deaf or have difficulty hearing?: No ?Does the patient have difficulty seeing, even when wearing glasses/contacts?: No ?Does the patient have difficulty concentrating, remembering, or making decisions?: Yes ?Patient able to express need for assistance with ADLs?: Yes ?Does the patient have difficulty dressing or bathing?: Yes ?Independently performs ADLs?: No ?Does the patient have difficulty walking or climbing stairs?: Yes ?Weakness of Legs: Both ?Weakness of Arms/Hands: None ? ?Permission Sought/Granted ?  ?  ?   ?   ?   ?   ? ?Emotional Assessment ?Appearance:: Appears stated age ?Attitude/Demeanor/Rapport: Engaged ?Affect (typically observed): Accepting ?Orientation: : Oriented to Self, Oriented to Place, Oriented to Situation ?  ?Psych Involvement: No (comment) ? ?Admission diagnosis:  Lacunar infarct, acute ([place]) [I63.81] ?Acute CVA (cerebrovascular accident) ([place]) [I63.9] ?Stage 5 chronic kidney disease on chronic dialysis ([place]) [N18.6, Z99.2] ?Patient Active Problem List  ? Diagnosis Date Noted  ? Lacunar infarct, acute ([place]) 01/24/2022  ? Sepsis ([place]) 09/03/2018  ? Hypoxia 09/03/2018  ? Chest pain 09/03/2018  ? COPD exacerbation ([place]) 09/03/2018  ? Tobacco abuse 06/04/2017  ? Marijuana abuse, continuous 01/28/2016  ? Pneumonia involving right lung 01/28/2016  ? Thrombocytopenia ([place]) 01/28/2016  ? Acute respiratory failure with hypoxia ([place]) 01/28/2016  ? Elevated troponin I level 01/28/2016  ? Benign essential HTN 01/28/2016  ? S/P bilateral BKA (below knee amputation) (  [place]) 01/28/2016  ? Type 2 diabetes mellitus ([place]) 01/28/2016  ? Phantom limb pain- Left stump 06/21/2015  ? Postoperative anemia due to acute blood loss 05/18/2015  ? Protein-calorie malnutrition, severe ([place]) 05/17/2015  ? Critical lower limb ischemia (HCC)   ? ESRD on hemodialysis ([place])   ? Hypertensive urgency   ? Acute encephalopathy   ? Elevated  troponin   ? Generalized weakness 05/14/2015  ? Gangrene of foot ([place]) 05/14/2015  ? Hypertensive heart disease 05/14/2015  ? Gangrene of lower extremity ([place]) 05/14/2015  ? S/P BKA (below knee amputation) bilateral ([place]) 02/07/2015  ? PAD (peripheral artery disease) ([place]) 01/07/2015  ? ESRD on dialysis ([place]) 01/04/2015  ? HLD (hyperlipidemia) 01/04/2015  ? DM (diabetes mellitus) type II, controlled, with peripheral vascular disorder ([place]) 01/04/2015  ? Atherosclerosis of native arteries of the extremities with gangrene ([place]) 01/04/2015  ? Type 2 diabetes mellitus with diabetic peripheral angiopathy without gangrene, with long-term current use of insulin ([place]) 01/04/2015  ? Mixed hyperlipidemia 01/04/2015  ? Dyspnea 05/18/2011  ? Hypertension   ? ?PCP:  [name], MD ?Pharmacy:   ?[id number], [address] ?[address] ?[place] [address] ?Phone: [phone] Fax: [phone] ? ? ? ? ?Social Determinants of Health (SDOH) Interventions ?  ? ?Readmission Risk Interventions ?No flowsheet data found. ? ? ?

## 2022-01-26 NOTE — Progress Notes (Signed)
Still waiting for wife to come and pick him up. ?

## 2022-01-26 NOTE — Discharge Summary (Signed)
Name: Garrett Salas MRN: [id number] DOB: 1951/11/04 71 y.o. PCP: [name], MD  Date of Admission: 01/24/2022 10:22 AM Date of Discharge:  01/26/2022 Attending Physician: Dr. Dareen Piano  DISCHARGE DIAGNOSIS:  Primary Problem: Lacunar infarct, acute [facility])   Hospital Problems: Principal Problem:   Lacunar infarct, acute ([place])    DISCHARGE MEDICATIONS:   Allergies as of 01/26/2022       Reactions   Lisinopril Anaphylaxis, Rash   Other reaction(s): Breathing Problems Angioedema   Aspirin    Other reaction(s): Other (See Comments) Bleeding from dialysis fistula        Medication List     STOP taking these medications    HYDROcodone-acetaminophen 5-325 MG tablet Commonly known as: Norco       TAKE these medications    Accu-Chek Aviva Plus test strip Generic drug: glucose blood   Accu-Chek Aviva Plus w/Device Kit   Accu-Chek Softclix Lancets lancets   albuterol 108 (90 Base) MCG/ACT inhaler Commonly known as: VENTOLIN HFA Inhale 2 puffs into the lungs every 6 (six) hours as needed for wheezing or shortness of breath.   amLODipine 5 MG tablet Commonly known as: NORVASC Take 5 mg by mouth daily. What changed: Another medication with the same name was removed. Continue taking this medication, and follow the directions you see here.   atorvastatin 40 MG tablet Commonly known as: LIPITOR Take 40 mg by mouth daily.   Auryxia 1 GM 210 MG(Fe) tablet Generic drug: ferric citrate Take 420 mg by mouth 3 (three) times daily with meals.   B-D UF III MINI PEN NEEDLES 31G X 5 MM Misc Generic drug: Insulin Pen Needle   calcitRIOL 0.5 MCG capsule Commonly known as: ROCALTROL Take 1 capsule (0.5 mcg total) by mouth Every Tuesday,Thursday,and Saturday with dialysis.   calcium carbonate 500 MG chewable tablet Commonly known as: TUMS - dosed in mg elemental calcium Chew 1-2 tablets by mouth 3 (three) times daily as needed for indigestion or heartburn.    DAILY VITE PO Take 1 tablet by mouth Every Tuesday,Thursday,and Saturday with dialysis.   diphenhydrAMINE 25 MG tablet Commonly known as: BENADRYL Take 25 mg by mouth as needed for itching.   metoCLOPramide 5 MG tablet Commonly known as: REGLAN Take 5 mg by mouth 3 (three) times daily before meals.   pregabalin 50 MG capsule Commonly known as: Lyrica Take 1 capsule (50 mg total) by mouth 3 (three) times daily. Take 1 capsule by mouth scheduled in the morning & up to twice daily as needed for pain. What changed:  medication strength how much to take when to take this additional instructions   sitaGLIPtin 25 MG tablet Commonly known as: JANUVIA Take 25 mg by mouth daily.   UltiCare Insulin Syringe 29G X 1/2" 0.5 ML Misc Generic drug: INSULIN SYRINGE .5CC/29G        DISPOSITION AND FOLLOW-UP:  Mr.Garrett Salas was discharged from [facility] in Stable condition. At the hospital follow up visit please address:  Acute ischemic stroke Consider attempt to redo echocardiogram and carotid ultrasounds if felt to be indicated. Monitor for worsening in neurological status.   ESRD on HD TTSa Anion gap metabolic acidosis Metabolic encephalopathy Asterixis, resolved Assess for tolerance of Lyrica which was restarted at a lower dose and continued baseline neurological status.   Hx type 2 diabetes mellitus Bilateral AKA Patient is reportedly not actually taking Januvia at home. HbA1c 5.9%.    Hx HTN Permissive hypertension given new  stroke during this admission. He was restarted on home amlodipine 5 mg.    Hx HLD LDL was above goal, at 75. Consider adjusting statin therapy.  Follow-up Recommendations: Consults: None Labs: CBC and Kidney Profile Studies: Consider attempt to obtain carotid ultrasound and echocardiogram again. Medications:   Medication changes: Lyrica, now 50 mg three times daily: once each morning and up to twice daily as needed for  pain. No other changes to medications.  Follow-up Appointments:  Follow-up Information     [name], MD. Schedule an appointment as soon as possible for a visit.   Specialty: Internal Medicine Why: make appointment for hospital follow up Contact information: [place] [id number] [place] Follow up.   Why: The home health agency will contact you for the first home visit. Contact information: [place]:  Patient Summary: Acute ischemic stroke Patient presented from HD and was found to have acute lacunar infarct in the right centrum semiovale and severe chronic ischemic injury and atrophy that is progressed from 2016 on brain MRI. He was very hypertensive with BP max 212/81. TSH WNL, HbA1c 5.9%, LDL above goal at 75. Echocardiogram was limited and incomplete due to patient being uncooperative with study. What was able to be obtained showed severe concentric left ventricular hypertrophy, severe mitral annular calcification, moderate calcification of the aortic valve with moderate thickening of the valve. Carotid ultrasound only able to be done on the L because of lack of cooperation and showed 1-39% stenosis. He had some agitation and verbal aggression which has resolved at time of discharge. PT/OT was consulted with recommendation for home health OT. Neurological status was at baseline at time of discharge.   ESRD on HD TTSa Anion gap metabolic acidosis Metabolic encephalopathy Asterixis, resolved Patient presented from HD where he did not complete full session. Admission labs were concerning for uremia, low bicarbonate, and increased anion gap. He had asterixis and ammonia was normal. Nephrology was consulted for assistance with HD and magnesium and phosphorous labs were collected which were normal. Lyrica was held on admission given concerns that it was leading to  asterixis. He underwent HD overnight 03/11.    Hx type 2 diabetes mellitus Bilateral AKA HbA1c 5.9% and patient's wife confirmed that he is not using Januvia at home. CBG appropriate throughout admission.   Hx HTN Permissive hypertension given new stroke during this admission. He was restarted on home amlodipine 5 mg.    Hx HLD Chronic condition. Lipid panel revealed LDL above goal at 75. Continued home atorvastatin 40 mg daily.   DISCHARGE INSTRUCTIONS:   Discharge Instructions     Call MD for:  persistant dizziness or light-headedness   Complete by: As directed    Call MD for:  redness, tenderness, or signs of infection (pain, swelling, redness, odor or green/yellow discharge around incision site)   Complete by: As directed    Call MD for:  temperature >100.4   Complete by: As directed    Diet - low sodium heart healthy   Complete by: As directed    Discharge instructions   Complete by: As directed    Mr. Garrett Salas,  It was a pleasure to care for you during your stay at [facility]. When you came to the hospital you were found to have a new stroke and  we brought you in to the hospital to run more tests.  You will have one medication change when you go home, and that is a new lower dose of Lyrica at 50 mg each morning and up to two additional doses throughout the day for pain. Please follow-up with your PCP in the next 1-2 weeks.  I wish you the best, Dr. Marlou Sa   Increase activity slowly   Complete by: As directed    No wound care   Complete by: As directed        SUBJECTIVE:  Patient evaluated at bedside on day of discharge. He says that he is feeling a little bit groggy today but overall well. When asked if he remembers why he came into the hospital he says he thinks that he had a fever and problems with his R knee and was told by his wife to see a doctor. When reminded that he was at dialysis and brought on EMS from there he had some recollection. I reminded him  that he needs to be kind to staff and he reluctantly agreed. He has refused labs and vitals again this morning.  Discharge Vitals:   BP (!) 153/59 (BP Location: Right Wrist)    Pulse 87    Temp 97.8 F (36.6 C) (Axillary)    Resp 18    Wt 50 kg    SpO2 96%    BMI 18.92 kg/m   OBJECTIVE:  Constitutional: Chronically ill appearing gentleman sitting up in bed. No acute distress. Cardio:Regular rate and rhythm. Systolic murmur noted. Pulm:Normal work of breathing on room air. Clear to auscultation bilaterally. OQH:[id number] BKA with wound over anterior aspects of bilateral stumps. Skin:Warm and dry. Neuro: Alert but appropriately conversive, oriented to self. Recalls speaking with wife this morning and she told him to be nicer to staff. Moves all extremities spontaneously.  Psych:Pleasantly confused.  Pertinent Labs, Studies, and Procedures:  CBC Latest Ref Rng & Units 01/25/2022 01/24/2022 06/26/2019  WBC 4.0 - 10.5 K/uL 5.5 6.3 -  Hemoglobin 13.0 - 17.0 g/dL 12.3(L) 12.5(L) 11.2(L)  Hematocrit 39.0 - 52.0 % 37.6(L) 38.4(L) 33.0(L)  Platelets 150 - 400 K/uL 160 143(L) -    CMP Latest Ref Rng & Units 01/25/2022 01/24/2022 06/26/2019  Glucose 70 - 99 mg/dL 168(H) 170(H) 94  BUN 8 - 23 mg/dL 16 41(H) -  Creatinine 0.61 - 1.24 mg/dL 4.04(H) 6.65(H) -  Sodium 135 - 145 mmol/L 135 131(L) 131(L)  Potassium 3.5 - 5.1 mmol/L 3.6 4.5 4.8  Chloride 98 - 111 mmol/L 97(L) 94(L) -  CO2 22 - 32 mmol/L 25 18(L) -  Calcium 8.9 - 10.3 mg/dL 8.7(L) 8.6(L) -  Total Protein 6.5 - 8.1 g/dL - 7.3 -  Total Bilirubin 0.3 - 1.2 mg/dL - 0.7 -  Alkaline Phos 38 - 126 U/L - 74 -  AST 15 - 41 U/L - 30 -  ALT 0 - 44 U/L - 17 -    DG Chest 2 View  Result Date: 01/24/2022 CLINICAL DATA:  71 year old male with history of shortness of breath and weakness. EXAM: CHEST - 2 VIEW COMPARISON:  Chest x-ray 02/15/2021. FINDINGS: Left-sided internal jugular PermCath with tip terminating at the superior cavoatrial junction.  Lung volumes are low. No consolidative airspace disease. No pleural effusions. No pneumothorax. No pulmonary nodule or mass noted. Pulmonary vasculature and the cardiomediastinal silhouette are within normal limits. Atherosclerotic calcifications in the thoracic aorta. IMPRESSION: 1. Low lung volumes without radiographic evidence of acute cardiopulmonary  disease. 2. Aortic atherosclerosis. Electronically Signed   By: Vinnie Langton M.D.   On: 01/24/2022 11:38   MR ANGIO HEAD WO CONTRAST  Result Date: 01/25/2022 CLINICAL DATA:  Acute stroke suspected EXAM: MRA HEAD WITHOUT CONTRAST TECHNIQUE: Angiographic images of the Circle of Willis were acquired using MRA technique without intravenous contrast. COMPARISON:  Brain MRI from yesterday FINDINGS: Anterior circulation: Vessels are smooth and diffusely patent. Negative for aneurysm or vascular malformation Posterior circulation: Codominant vertebral arteries. Vertebral and basilar arteries are smoothly contoured and widely patent. Negative for aneurysm or vascular malformation Anatomic variants: Hypoplastic right A1 segment. Other: None. IMPRESSION: Negative intracranial MRA. Electronically Signed   By: Jorje Guild M.D.   On: 01/25/2022 11:57   MR BRAIN WO CONTRAST  Result Date: 01/24/2022 CLINICAL DATA:  Neuro deficit with acute stroke suspected EXAM: MRI HEAD WITHOUT CONTRAST TECHNIQUE: Multiplanar, multiecho pulse sequences of the brain and surrounding structures were obtained without intravenous contrast. COMPARISON:  Head CT 09/03/2018 and brain MRI 05/23/2015 FINDINGS: Brain: Subcentimeter restricted diffusion in the right corona radiata. Chronic blood products at a remote low right parietal cortex infarct which is small to moderate size. Confluent chronic small vessel ischemia in the hemispheric white matter. Innumerable small cerebellar infarcts remotely. Brain atrophy with ventriculomegaly. No obstructive hydrocephalus or extra-axial collection.  Vascular: Major flow voids are preserved Skull and upper cervical spine: Normal marrow signal Sinuses/Orbits: Right cataract resection IMPRESSION: 1. Acute lacunar infarct in the right centrum semiovale. 2. Severe chronic ischemic injury and atrophy that has progressed from 2016. Electronically Signed   By: Jorje Guild M.D.   On: 01/24/2022 12:42   VAS US CAROTID  Result Date: 01/25/2022 Carotid Arterial Duplex Study Patient Name:  Garrett Salas  Date of Exam:   01/25/2022 Medical Rec #: [id number]        Accession #:    [id number] Date of Birth: [birth year]        Patient Gender: M Patient Age:   69 years Exam Location:  [facility] Procedure:      VAS US CAROTID Referring Phys: Velna Ochs --------------------------------------------------------------------------------  Indications:       CVA. Risk Factors:      Hypertension, hyperlipidemia, Diabetes, current smoker, prior                    CVA, PAD. Other Factors:     ESRD, on dialysis. Medically non compliant. Marijuana abuse.                    Bilateral BKA. Limitations        Today's exam was limited due to the patient's inability or                    unwillingness to cooperate. Comparison Study:  No prior study Performing Technologist: Sharion Dove RVS  Examination Guidelines: A complete evaluation includes B-mode imaging, spectral Doppler, color Doppler, and power Doppler as needed of all accessible portions of each vessel. Bilateral testing is considered an integral part of a complete examination. Limited examinations for reoccurring indications may be performed as noted.  Left Carotid Findings: +----------+--------+--------+--------+----------------------+---------+             PSV cm/s EDV cm/s Stenosis Plaque Description     Comments   +----------+--------+--------+--------+----------------------+---------+  CCA Prox   60       12                irregular and  calcific             +----------+--------+--------+--------+----------------------+---------+  CCA Distal 71       18                irregular and calcific            +----------+--------+--------+--------+----------------------+---------+  ICA Prox   169      26       1-39%    calcific               Shadowing  +----------+--------+--------+--------+----------------------+---------+  ICA Distal 89       20                                                  +----------+--------+--------+--------+----------------------+---------+  ECA        183      20                                                  +----------+--------+--------+--------+----------------------+---------+ +----------+--------+--------+------------+-------------------+             PSV cm/s EDV cm/s Describe     Arm Pressure (mmHG)  +----------+--------+--------+------------+-------------------+  Subclavian                   Not assessed                      +----------+--------+--------+------------+-------------------+ +---------+--------+--+--------+----------+  Vertebral PSV cm/s 58 EDV cm/s Retrograde  +---------+--------+--+--------+----------+   Summary: Right Carotid: Unable to scan right side secondary to patient's refusal to turn                onto back or to turn head. Left Carotid: Velocities in the left ICA are consistent with a 1-39% stenosis. Vertebrals:  Left vertebral artery demonstrates retrograde flow. Subclavians: Left subclavian artery was not visualized. *See table(s) above for measurements and observations.  Electronically signed by Servando Snare MD on 01/25/2022 at 4:18:26 PM.    Final    ECHOCARDIOGRAM LIMITED  Result Date: 01/25/2022    ECHOCARDIOGRAM REPORT   Patient Name:   Garrett Salas Date of Exam: 01/25/2022 Medical Rec #:  [id number]       Height:       64.0 in Accession #:    [id number]      Weight:       110.2 lb Date of Birth:  24-Dec-1950       BSA:          1.519 m Patient Age:    17 years        BP:           166/48 mmHg Patient  Gender: M               HR:           68 bpm. Exam Location:  Inpatient Procedure: Limited Echo, Cardiac Doppler and Limited Color Doppler Indications:    stroke  History:        Patient has prior history of Echocardiogram examinations, most                 recent 01/08/2015.  Sonographer:    Johny Chess  RDCS Referring Phys: [id number] Velna Ochs  Sonographer Comments: Image acquisition challenging due to uncooperative patient. IMPRESSIONS  1. Incomplete study as patient became uncooperative and only 18 images obtained. Will need to repeat study once patient is more willing to undergo study.  2. There is severe concentric left ventricular hypertrophy.  3. Severe mitral annular calcification.  4. The aortic valve is tricuspid. There is moderate calcification of the aortic valve. There is moderate thickening of the aortic valve. Aortic valve regurgitation appears mild based on one view; other views not obtained as study terminated early. FINDINGS  Left Ventricle: The left ventricular internal cavity size was normal in size. There is severe concentric left ventricular hypertrophy. Left ventricular diastolic function could not be evaluated. Right Ventricle: The right ventricular size is not well visualized. Right vetricular wall thickness was not assessed. Right ventricular systolic function was not well visualized. Left Atrium: Left atrial size was not assessed. Right Atrium: Right atrial size was not assessed. Pericardium: The pericardium was not assessed. Mitral Valve: The mitral valve was not assessed. There is moderate thickening of the mitral valve leaflet(s). There is moderate calcification of the mitral valve leaflet(s). Severe mitral annular calcification. Tricuspid Valve: The tricuspid valve is normal in structure. Tricuspid valve regurgitation is trivial. Aortic Valve: The aortic valve is tricuspid. There is moderate calcification of the aortic valve. There is moderate thickening of the aortic valve.  Aortic valve regurgitation is mild. Pulmonic Valve: The pulmonic valve was normal in structure. Pulmonic valve regurgitation is trivial. Aorta: The aortic root and ascending aorta are structurally normal, with no evidence of dilitation. IAS/Shunts: The interatrial septum was not assessed.  LEFT VENTRICLE PLAX 2D LVIDd:         4.90 cm LVIDs:         3.60 cm LV PW:         1.30 cm LV IVS:        1.50 cm LVOT diam:     2.20 cm LVOT Area:     3.80 cm  LEFT ATRIUM         Index LA diam:    4.00 cm 2.63 cm/m   AORTA Ao Root diam: 3.30 cm Ao Asc diam:  3.20 cm  SHUNTS Systemic Diam: 2.20 cm Gwyndolyn Kaufman MD Electronically signed by Gwyndolyn Kaufman MD Signature Date/Time: 01/25/2022/12:04:04 PM    Final      Signed: Farrel Gordon, D.O.  Internal Medicine Resident, PGY-1 [facility]  Pager: [phone] 1:16 PM, 01/26/2022

## 2022-01-26 NOTE — Progress Notes (Signed)
All set for discharge home. Wife made aware, claiming to come and pick him up.  ?

## 2022-01-26 NOTE — TOC Transition Note (Signed)
Transition of Care (TOC) - CM/SW Discharge Note ? ? ?Patient Details  ?Name: Garrett Salas [facility] ?MRN: [id number] ?Date of Birth: 10-Apr-1951 ? ?Transition of Care (TOC) CM/SW Contact:  ?Pollie Friar, RN ?Phone Number: ?01/26/2022, 1:21 PM ? ? ?Clinical Narrative:    ?Patient discharging home with home health through [facility]. Information on the AVS. ?Pt states wife will provide transport home. ? ? ?Final next level of care: [place] ?Barriers to Discharge: No Barriers Identified ? ? ?Patient Goals and CMS Choice ?  ?CMS Medicare.gov Compare Post Acute Care list provided to:: Patient ?Choice offered to / list presented to : Patient ? ?Discharge Placement ?  ?           ?  ?  ?  ?  ? ?Discharge Plan and Services ?  ?Discharge Planning Services: CM Consult ?Post Acute Care Choice: Home Health          ?  ?  ?  ?  ?  ?HH Arranged: Therapist, sports, OT ?[place] Agency: [place] ?Date HH Agency Contacted: 01/26/22 ?  ?Representative spoke with at [facility]: Corene Cornea ? ?Social Determinants of Health (SDOH) Interventions ?  ? ? ?Readmission Risk Interventions ?No flowsheet data found. ? ? ? ? ?

## 2022-01-27 LAB — RENAL FUNCTION PANEL
Albumin: 3 g/dL — ABNORMAL LOW (ref 3.5–5.0)
Anion gap: 11 (ref 5–15)
BUN: 26 mg/dL — ABNORMAL HIGH (ref 8–23)
CO2: 25 mmol/L (ref 22–32)
Calcium: 8.1 mg/dL — ABNORMAL LOW (ref 8.9–10.3)
Chloride: 97 mmol/L — ABNORMAL LOW (ref 98–111)
Creatinine, Ser: 4.79 mg/dL — ABNORMAL HIGH (ref 0.61–1.24)
GFR, Estimated: 12 mL/min — ABNORMAL LOW (ref >60)
Glucose, Bld: 129 mg/dL — ABNORMAL HIGH (ref 70–99)
Phosphorus: 3.6 mg/dL (ref 2.5–4.6)
Potassium: 4 mmol/L (ref 3.5–5.1)
Sodium: 133 mmol/L — ABNORMAL LOW (ref 135–145)

## 2022-01-27 LAB — CBC
HCT: 32.6 % — ABNORMAL LOW (ref 39.0–52.0)
Hemoglobin: 10.3 g/dL — ABNORMAL LOW (ref 13.0–17.0)
MCH: 29.7 pg (ref 26.0–34.0)
MCHC: 31.6 g/dL (ref 30.0–36.0)
MCV: 93.9 fL (ref 80.0–100.0)
Platelets: 139 10*3/uL — ABNORMAL LOW (ref 150–400)
RBC: 3.47 MIL/uL — ABNORMAL LOW (ref 4.22–5.81)
RDW: 15 % (ref 11.5–15.5)
WBC: 4.2 10*3/uL (ref 4.0–10.5)
nRBC: 0 % (ref 0.0–0.2)

## 2022-01-27 LAB — GLUCOSE, CAPILLARY
Glucose-Capillary: 94 mg/dL (ref 70–99)
Glucose-Capillary: 112 mg/dL — ABNORMAL HIGH (ref 70–99)

## 2022-01-27 LAB — HEPATITIS B SURFACE ANTIBODY, QUANTITATIVE: Hep B S AB Quant (Post): 181.9 m[IU]/mL (ref >9.9)

## 2022-01-27 MED ORDER — HEPARIN SODIUM (PORCINE) 1000 UNIT/ML IJ SOLN
INTRAMUSCULAR | Status: AC
  Filled 2022-01-27: qty 1

## 2022-01-27 MED ORDER — AMLODIPINE BESYLATE 10 MG PO TABS: 10.0000 mg | ORAL_TABLET | Freq: Every day | ORAL | 2 refills | Status: AC

## 2022-01-27 MED ORDER — AMLODIPINE BESYLATE 5 MG PO TABS
5.0000 mg | ORAL_TABLET | Freq: Once | ORAL | Status: AC
  Administered 2022-01-27: 5 mg via ORAL
  Filled 2022-01-27: qty 1

## 2022-01-27 MED ORDER — AMLODIPINE BESYLATE 10 MG PO TABS: 10.0000 mg | ORAL_TABLET | Freq: Every day | ORAL | Status: DC

## 2022-01-27 NOTE — Progress Notes (Signed)
Patient evaluated at bedside during HD. He was sleepy but otherwise well and had no complaints. He tells the team that he had a disagreement with his wife on the phone yesterday and didn't want to go more in depth about the conversation. He says that he feels safe going home.  ? ?Constitutional:Sleepy in HD bed, no acute distress. ?HENT: ?Eyes: ?Neck: ?Cardio:Regular rate and rhythm. Systolic murmur noted. ?Pulm:Normal work of breathing on room air. Clear to anterior auscultation bilaterally. ?Chest: ?Abdomen:Soft, nontender, nondistended. ?GU: ?MPN:[id number] BKA with unchanged wounds. ?Skin:Warm and dry. ?Neuro:Alert but sleepy, responds appropriately to questions. ?Psych:Normal mood and affect. ? ?Assessment/Plan: No changes overnight. He is still medically stable for discharge. I have consulted TOC to assist in arranging a ride home today if his wife is unable to pick him up. ? ?Farrel Gordon, DO  ?Internal Medicine Resident PGY-1 ?[place]  ?Pager: [phone] ?

## 2022-01-27 NOTE — Progress Notes (Signed)
CM was able to speak to patient's spouse over the phone. She is reluctantly picking him up today. She was wanting LT care for him but CM has updated her that this will need to take place from home. The hospital doesn't place to LT care only rehab. Per therapy notes and pt he is at baseline so wont qualify for rehab.  ?

## 2022-01-27 NOTE — Care Management Important Message (Signed)
Important Message ? ?Patient Details  ?Name: Finlay Godbee [facility] ?MRN: [id number] ?Date of Birth: December 10, 1950 ? ? ?Medicare Important Message Given:  Yes ? ? ? ? ?Lewis Grivas ?01/27/2022, 2:10 PM ?

## 2022-01-27 NOTE — Progress Notes (Signed)
Pt wife has been called twice to confirm pts pick up time, no response. Pt was upset on phone with wife discussing picking him up. When RN returned to room pt was off phone and stated his wife was not picking him up but would be here today. Pt states he would like to talk to someone about facility placement. Provider paged and informed.  ?

## 2022-01-27 NOTE — Progress Notes (Signed)
Pt did not d/c yesterday as planned. Contacted [facility] this am to make clinic aware that pt to receive HD here today. Pt is supposed to d/c to home with wife today. Clinic advised of this plan and that pt will resume care on Thursday. ? ?Melven Sartorius ?Renal Navigator ?[phone] ?

## 2022-01-27 NOTE — Plan of Care (Signed)
Pt is alert oriented x 4. Pts wife did not pick him up. Provider informed. Wife has been called multiple times by RN,pt and provider. Pt denies pain. Pt has had one episode of diarrhea tonight. Pt cleaned. No distress noted. Pt asked to speak with someone to day about placement.  ? ?Problem: Education: ?Goal: Knowledge of General Education information will improve ?Description: Including pain rating scale, medication(s)/side effects and non-pharmacologic comfort measures ?Outcome: Progressing ?  ?Problem: Education: ?Goal: Knowledge of General Education information will improve ?Description: Including pain rating scale, medication(s)/side effects and non-pharmacologic comfort measures ?Outcome: Progressing ?  ?Problem: Health Behavior/Discharge Planning: ?Goal: Ability to manage health-related needs will improve ?Outcome: Progressing ?  ?Problem: Clinical Measurements: ?Goal: Ability to maintain clinical measurements within normal limits will improve ?Outcome: Progressing ?Goal: Will remain free from infection ?Outcome: Progressing ?Goal: Diagnostic test results will improve ?Outcome: Progressing ?Goal: Respiratory complications will improve ?Outcome: Progressing ?Goal: Cardiovascular complication will be avoided ?Outcome: Progressing ?  ?Problem: Activity: ?Goal: Risk for activity intolerance will decrease ?Outcome: Progressing ?  ?Problem: Nutrition: ?Goal: Adequate nutrition will be maintained ?Outcome: Progressing ?  ?Problem: Elimination: ?Goal: Will not experience complications related to bowel motility ?Outcome: Progressing ?Goal: Will not experience complications related to urinary retention ?Outcome: Progressing ?  ?Problem: Pain Managment: ?Goal: General experience of comfort will improve ?Outcome: Progressing ?  ?Problem: Skin Integrity: ?Goal: Risk for impaired skin integrity will decrease ?Outcome: Progressing ?  ?Problem: Safety: ?Goal: Ability to remain free from injury will improve ?Outcome:  Progressing ?  ?Problem: Skin Integrity: ?Goal: Risk for impaired skin integrity will decrease ?Outcome: Progressing ?  ?Problem: Education: ?Goal: Knowledge of disease or condition will improve ?Outcome: Progressing ?Goal: Knowledge of secondary prevention will improve (SELECT ALL) ?Outcome: Progressing ?Goal: Knowledge of patient specific risk factors will improve (INDIVIDUALIZE FOR PATIENT) ?Outcome: Progressing ?  ?Problem: Self-Care: ?Goal: Ability to participate in self-care as condition permits will improve ?Outcome: Progressing ?Goal: Ability to communicate needs accurately will improve ?Outcome: Progressing ?  ?Problem: Ischemic Stroke/TIA Tissue Perfusion: ?Goal: Complications of ischemic stroke/TIA will be minimized ?Outcome: Progressing ?  ?Problem: Ischemic Stroke/TIA Tissue Perfusion: ?Goal: Complications of ischemic stroke/TIA will be minimized ?Outcome: Progressing ?  ?

## 2022-01-27 NOTE — Progress Notes (Signed)
PT Cancellation Note ? ?Patient Details ?Name: Devlin Brink [facility] ?MRN: [id number] ?DOB: 03/14/1951 ? ? ?Cancelled Treatment:    Reason Eval/Treat Not Completed: Patient at procedure or test/unavailable. Pt off the floor at HD. Acute PT to return as able to progress mobility. ? ?Kittie Plater, PT, DPT ?Acute Rehabilitation Services ?Pager #: [phone] ?Office #: [phone] ? ? ? ?Tyton Abdallah M Avilene Marrin ?01/27/2022, 11:10 AM ? ? ?

## 2022-01-27 NOTE — Progress Notes (Signed)
[facility] ?Progress Note ? ?  ? OP HD: TTS [place] ? 3.5h  2k/3Ca bath   43.3kg  P2  TDC  Hep none ? - rocaltrol 0.25 po tiw ? - no esa, last Hb 11 ? - last HD 3/09 (outpt) ?  ?  ?Assessment/ Plan: ?Acute CVA - acute lacunar CVA per MRI.  Per pmd.  ?ESRD - on HD TTS.  Next HD 3/14.  ?Asterixis - mild, possibly uremia (but azotemia is not bad) vs Lyrica. Consider restarting Lyrica at lower dose and avoid narcotics if at all possible. 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 Ca and phos in range.  ?  ? ?Subjective: seen in room, no c/o, denies f/c/n/v/dyspnea ? ?Vitals:  ? 01/26/22 1727 01/26/22 2021 01/27/22 0115 01/27/22 0449  ?BP: (!) 128/98 (!) 157/79 (!) 165/53 140/89  ?Pulse: 89 78 72 72  ?Resp: '16 20 20 20  '$ ?Temp: 98.2 ?F (36.8 ?C) 98 ?F (36.7 ?C) 98.3 ?F (36.8 ?C)   ?TempSrc: Oral Oral Oral   ?SpO2:  100% 100% 98%  ?Weight:      ? ? ?Exam: ?Gen alert, poor vision, responsive ?No jvd or bruits ?Chest clear bilat to bases ?RRR no RG ?Abd soft ntnd no mass or ascites +bs ?MS R hand finger amps ?Ext no edema, bilat BKA ?Neuro alert, Ox 3 , nf ?  RIJ TDC intact ?  ?   ? Home meds include - norvasc 5, lipitor, auryxia 2 ac tid, lyrica 75 bid, januvia, norco prn, reglan 5 tid , prns/ vits / supps ?  ? ? ?Recent Labs  ?Lab 01/24/22 ?1139 01/24/22 ?1837 01/24/22 ?2123 01/25/22 ?1755  ?HGB 12.5*  --   --  12.3*  ?ALBUMIN 3.2*  --   --   --   ?CALCIUM 8.6*  --   --  8.7*  ?PHOS  --  5.7* 5.7*  --   ?CREATININE 6.65*  --   --  4.04*  ?K 4.5  --   --  3.6  ? ?Inpatient medications: ? amLODipine  5 mg Oral Daily  ? atorvastatin  40 mg Oral Daily  ? calcitRIOL  0.25 mcg Oral Q T,Th,Sa-HD  ? Chlorhexidine Gluconate Cloth  6 each Topical Q0600  ? clopidogrel  75 mg Oral Daily  ? ferric citrate  420 mg Oral TID WC  ? heparin  5,000 Units Subcutaneous Q8H  ? insulin  aspart  0-6 Units Subcutaneous TID WC  ? ? ?acetaminophen **OR** acetaminophen, ondansetron **OR** ondansetron (ZOFRAN) IV, senna-docusate ? ? ? ? ? ? ?

## 2022-01-29 ENCOUNTER — Telehealth: Payer: Self-pay | Admitting: Nephrology

## 2022-01-29 NOTE — Telephone Encounter (Signed)
Transition of Care Contact from Inpatient Facility ? ?Date of Discharge: 01/27/22 ?Date of Contact: 01/29/22 ?Method of contact: phone - attempted ? ?Attempted to contact patient to discuss transition of care from inpatient admission.  Patient did not answer the phone.  Will follow up with patient at dialysis.  ? ?Jen Mow, PA-C ?[facility] ?Pager: [phone] ? ?

## 2022-01-30 ENCOUNTER — Other Ambulatory Visit: Payer: Self-pay | Admitting: *Deleted

## 2022-01-30 NOTE — Progress Notes (Addendum)
FERRIS, FIELDEN ([id number]) ?Visit Report for 02/02/2022 ?Allergy List Details ?Patient Name: Date of Service: ?[name], [name] 02/02/2022 9:00 A M ?Medical Record Number: [id number] ?Patient Account Number: [id number] ?[name]: Treating RN: ?1951-06-30 (71 y.o. M) ?Primary Care Guy Toney: CA MPBELL, STEPHEN Other Clinician: ?Referring Retaj Hilbun: ?Treating Kaylon Hitz/Extender: Kalman Shan ?CA MPBELL, STEPHEN ?Weeks in Treatment: 0 ?Allergies ?Active Allergies ?lisinopril ?Reaction: Anaphylaxis, rash ?Severity: Severe ?Active: 03/11/2020 ?aspirin ?Reaction: Bleeding from dialysis fistula ?Active: 01/29/2020 ?Allergy Notes ?Electronic Signature(s) ?Signed: 01/30/2022 2:41:48 PM By: Donavan Burnet CHT EMT BS ?, , ?Entered By: Donavan Burnet on 01/30/2022 14:41:48 ?-------------------------------------------------------------------------------- ?Arrival Information Details ?Patient Name: Date of Service: ?[name], [name] 02/02/2022 9:00 A M ?Medical Record Number: [id number] ?Patient Account Number: [id number] ?[name]: Treating RN: ?1951/02/21 (71 y.o. M) [name], [name] ?Primary Care Isiaah Cuervo: CA MPBELL, STEPHEN Other Clinician: ?Referring Eirene Rather: ?Treating Kelsey Edman/Extender: Kalman Shan ?CA MPBELL, STEPHEN ?Weeks in Treatment: 0 ?Visit Information ?Patient Arrived: Wheel Chair ?Arrival Time: 09:08 ?Accompanied By: wife ?Transfer Assistance: Manual ?Patient Identification Verified: Yes ?Secondary Verification Process Completed: Yes ?Patient Requires Transmission-Based Precautions: No ?Patient Has Alerts: No ?Electronic Signature(s) ?Signed: 02/02/2022 4:35:29 PM By: Deon Pilling RN, BSN ?Entered By: Deon Pilling on 02/02/2022 09:11:38 ?-------------------------------------------------------------------------------- ?Clinic Level of Care Assessment Details ?Patient Name: Date of Service: ?[name], [name] 02/02/2022 9:00 A M ?Medical Record Number: [id number] ?Patient Account  Number: [id number] ?[name]: Treating RN: ?1950/11/22 (71 y.o. M) [name], [name] ?Primary Care Arielys Wandersee: CA MPBELL, STEPHEN Other Clinician: ?Referring Gerrald Basu: ?Treating Rayvion Stumph/Extender: Kalman Shan ?CA MPBELL, STEPHEN ?Weeks in Treatment: 0 ?Clinic Level of Care Assessment Items ?TOOL 2 Quantity Score ?X- 1 0 ?Use when only an EandM is performed on the INITIAL visit ?ASSESSMENTS - Nursing Assessment / Reassessment ?X- 1 20 ?General Physical Exam (combine w/ comprehensive assessment (listed just below) when performed on new pt. evals) ?X- 1 25 ?Comprehensive Assessment (HX, ROS, Risk Assessments, Wounds Hx, etc.) ?ASSESSMENTS - Wound and Skin A ssessment / Reassessment ?'[]'$  - 0 ?Simple Wound Assessment / Reassessment - one wound ?X- 2 5 ?Complex Wound Assessment / Reassessment - multiple wounds ?X- 1 10 ?Dermatologic / Skin Assessment (not related to wound area) ?ASSESSMENTS - Ostomy and/or Continence Assessment and Care ?'[]'$  - 0 ?Incontinence Assessment and Management ?'[]'$  - 0 ?Ostomy Care Assessment and Management (repouching, etc.) ?PROCESS - Coordination of Care ?'[]'$  - 0 ?Simple Patient / Family Education for ongoing care ?X- 1 20 ?Complex (extensive) Patient / Family Education for ongoing care ?X- 1 10 ?Staff obtains Consents, Records, T Results / Process Orders ?est ?X- 1 10 ?Staff telephones HHA, Nursing Homes / Clarify orders / etc ?'[]'$  - 0 ?Routine Transfer to another Facility (non-emergent condition) ?'[]'$  - 0 ?Routine Hospital Admission (non-emergent condition) ?'[]'$  - 0 ?New Admissions / Biomedical engineer / Ordering NPWT Apligraf, etc. ?, ?'[]'$  - 0 ?Emergency Hospital Admission (emergent condition) ?'[]'$  - 0 ?Simple Discharge Coordination ?X- 1 15 ?Complex (extensive) Discharge Coordination ?PROCESS - Special Needs ?'[]'$  - 0 ?Pediatric / Minor Patient Management ?'[]'$  - 0 ?Isolation Patient Management ?'[]'$  - 0 ?Hearing / Language / Visual special needs ?'[]'$  - 0 ?Assessment of Community assistance  (transportation, D/C planning, etc.) ?'[]'$  - 0 ?Additional assistance / Altered mentation ?'[]'$  - 0 ?Support Surface(s) Assessment (bed, cushion, seat, etc.) ?INTERVENTIONS - Wound Cleansing / Measurement ?X- 1 5 ?Wound Imaging (photographs - any number of wounds) ?'[]'$  - 0 ?Wound Tracing (instead of  photographs) ?'[]'$  - 0 ?Simple Wound Measurement - one wound ?X- 2 5 ?Complex Wound Measurement - multiple wounds ?'[]'$  - 0 ?Simple Wound Cleansing - one wound ?X- 2 5 ?Complex Wound Cleansing - multiple wounds ?INTERVENTIONS - Wound Dressings ?X - Small Wound Dressing one or multiple wounds 2 10 ?'[]'$  - 0 ?Medium Wound Dressing one or multiple wounds ?'[]'$  - 0 ?Large Wound Dressing one or multiple wounds ?'[]'$  - 0 ?Application of Medications - injection ?INTERVENTIONS - Miscellaneous ?'[]'$  - 0 ?External ear exam ?'[]'$  - 0 ?Specimen Collection (cultures, biopsies, blood, body fluids, etc.) ?'[]'$  - 0 ?Specimen(s) / Culture(s) sent or taken to Lab for analysis ?'[]'$  - 0 ?Patient Transfer (multiple staff / Civil Service fast streamer / Similar devices) ?'[]'$  - 0 ?Simple Staple / Suture removal (25 or less) ?'[]'$  - 0 ?Complex Staple / Suture removal (26 or more) ?'[]'$  - 0 ?Hypo / Hyperglycemic Management (close monitor of Blood Glucose) ?'[]'$  - 0 ?Ankle / Brachial Index (ABI) - do not check if billed separately ?Has the patient been seen at the hospital within the last three years: Yes ?Total Score: 165 ?Level Of Care: New/Established - Level 5 ?Electronic Signature(s) ?Signed: 02/02/2022 4:35:29 PM By: Deon Pilling RN, BSN ?Entered By: Deon Pilling on 02/02/2022 12:00:34 ?-------------------------------------------------------------------------------- ?Encounter Discharge Information Details ?Patient Name: Date of Service: ?[name], [name] 02/02/2022 9:00 A M ?Medical Record Number: [id number] ?Patient Account Number: [id number] ?[name]: Treating RN: ?05-14-1951 (71 y.o. M) [name], [name] ?Primary Care Trivia Heffelfinger: CA MPBELL, STEPHEN Other Clinician: ?Referring  Natalija Mavis: ?Treating Kimberlin Scheel/Extender: Kalman Shan ?CA MPBELL, STEPHEN ?Weeks in Treatment: 0 ?Encounter Discharge Information Items ?Discharge Condition: Stable ?Ambulatory Status: Wheelchair ?Discharge Destination: Home ?Transportation: Private Auto ?Accompanied By: wife ?Schedule Follow-up Appointment: No ?Clinical Summary of Care: Patient Declined ?Electronic Signature(s) ?Signed: 02/02/2022 4:35:29 PM By: Deon Pilling RN, BSN ?Entered By: Deon Pilling on 02/02/2022 12:01:50 ?-------------------------------------------------------------------------------- ?Lower Extremity Assessment Details ?Patient Name: Date of Service: ?[name], [name] 02/02/2022 9:00 A M ?Medical Record Number: [id number] ?Patient Account Number: [id number] ?[name]: ?Treating RN: ?[birth year] (71 y.o. M) [name], [name] ?Primary Care Barkley Kratochvil: CA MPBELL, STEPHEN ?Other Clinician: ?Referring Natika Geyer: ?Treating Lanette Ell/Extender: Kalman Shan ?CA MPBELL, STEPHEN ?Weeks in Treatment: 0 ?Electronic Signature(s) ?Signed: 02/02/2022 4:35:29 PM By: Deon Pilling RN, BSN ?Entered By: Deon Pilling on 02/02/2022 09:27:30 ?-------------------------------------------------------------------------------- ?Multi Wound Chart Details ?Patient Name: ?Date of Service: ?[name], [name] 02/02/2022 9:00 A M ?Medical Record Number: [id number] ?Patient Account Number: [id number] ?[name]: ?Treating RN: ?03/12/1951 (71 y.o. M) ?Primary Care Floye Fesler: CA MPBELL, STEPHEN ?Other Clinician: ?Referring Malley Hauter: ?Treating Chalmers Iddings/Extender: Kalman Shan ?CA MPBELL, STEPHEN ?Weeks in Treatment: 0 ?Vital Signs ?Height(in): 64 ?Pulse(bpm): 75 ?Weight(lbs): 144 ?Blood Pressure(mmHg): 220/90 ?Body Mass Index(BMI): 24.7 ?Temperature(??F): 97.5 ?Respiratory Rate(breaths/min): 16 ?Photos: [N/A:N/A] ?Right, Anterior Lower Leg Left Knee N/A ?Wound Location: ?Gradually Appeared Gradually Appeared N/A ?Wounding Event: ?Diabetic Wound/Ulcer of  the Lower Diabetic Wound/Ulcer of the Lower N/A ?Primary Etiology: ?Extremity Extremity ?Arterial Insufficiency Ulcer Abrasion N/A ?Secondary Etiology: ?Chronic Obstructive Pulmonary Chronic Obstructive Pulmon

## 2022-01-30 NOTE — Patient Outreach (Signed)
Received a red flag Emmi stroke notification for Garrett Salas.  ?I have assigned Garrett David, Garrett Salas to call for follow up and determine if there are any Case Management needs.  ?  ?Arville Care, CBCS, CMAA ?[place] Management Assistant ?[place] [facility] ?[phone]  ?

## 2022-01-30 NOTE — Patient Outreach (Signed)
[facility]) Care Management ? ?01/30/2022 ? ?[place] ?1951/03/21 ?[id number] ? ? ?RED ON EMMI ALERT - Stroke ?Day # 1 ?Date: 3/16 ?Red Alert Reason: Scheduled follow up appointment? NO ?Filled new prescriptions?  NO ? ? ?Outreach attempt #1, unsuccessful, unable to leave voice message.  Noted that member's wife has same listed number.   ? ? ?Plan: ?RN CM will send outreach letter and follow up within the next 3-4 business days. ? ?Valente David, RN, MSN, CCM ?[facility] Management  ?Community Care Manager ?[phone] ? ? ?

## 2022-02-02 ENCOUNTER — Encounter (HOSPITAL_BASED_OUTPATIENT_CLINIC_OR_DEPARTMENT_OTHER): Payer: Medicare (Managed Care) | Attending: Internal Medicine | Admitting: Internal Medicine

## 2022-02-02 ENCOUNTER — Other Ambulatory Visit: Payer: Self-pay

## 2022-02-02 DIAGNOSIS — Z89511 Acquired absence of right leg below knee: Secondary | ICD-10-CM | POA: Insufficient documentation

## 2022-02-02 DIAGNOSIS — Z992 Dependence on renal dialysis: Secondary | ICD-10-CM | POA: Insufficient documentation

## 2022-02-02 DIAGNOSIS — N186 End stage renal disease: Secondary | ICD-10-CM | POA: Diagnosis not present

## 2022-02-02 DIAGNOSIS — L97814 Non-pressure chronic ulcer of other part of right lower leg with necrosis of bone: Secondary | ICD-10-CM | POA: Diagnosis not present

## 2022-02-02 DIAGNOSIS — I120 Hypertensive chronic kidney disease with stage 5 chronic kidney disease or end stage renal disease: Secondary | ICD-10-CM | POA: Diagnosis not present

## 2022-02-02 DIAGNOSIS — G8929 Other chronic pain: Secondary | ICD-10-CM | POA: Insufficient documentation

## 2022-02-02 DIAGNOSIS — E11622 Type 2 diabetes mellitus with other skin ulcer: Secondary | ICD-10-CM | POA: Insufficient documentation

## 2022-02-02 DIAGNOSIS — J449 Chronic obstructive pulmonary disease, unspecified: Secondary | ICD-10-CM | POA: Insufficient documentation

## 2022-02-02 DIAGNOSIS — E1122 Type 2 diabetes mellitus with diabetic chronic kidney disease: Secondary | ICD-10-CM | POA: Diagnosis not present

## 2022-02-02 DIAGNOSIS — L97823 Non-pressure chronic ulcer of other part of left lower leg with necrosis of muscle: Secondary | ICD-10-CM | POA: Insufficient documentation

## 2022-02-02 DIAGNOSIS — Z89512 Acquired absence of left leg below knee: Secondary | ICD-10-CM | POA: Diagnosis not present

## 2022-02-02 NOTE — Progress Notes (Signed)
Garrett Salas, Garrett Salas ([id number]) ?Visit Report for 02/02/2022 ?Abuse Risk Screen Details ?Patient Name: Date of Service: ?[name], [name] 02/02/2022 9:00 A M ?Medical Record Number: [id number] ?Patient Account Number: [id number] ?[name]: Treating RN: ?[birth year] (71 y.o. M) [name], [name] ?Primary Care Glorene Leitzke: CA MPBELL, STEPHEN Other Clinician: ?Referring Garrett Salas: ?Treating Garrett Salas/Extender: Garrett Salas ?CA MPBELL, STEPHEN ?Weeks in Treatment: 0 ?Abuse Risk Screen Items ?Answer ?ABUSE RISK SCREEN: ?Has anyone close to you tried to hurt or harm you recentlyo No ?Do you feel uncomfortable with anyone in your familyo No ?Has anyone forced you do things that you didnt want to doo No ?Electronic Signature(s) ?Signed: 02/02/2022 4:35:29 PM By: Deon Pilling RN, BSN ?Entered By: Deon Pilling on 02/02/2022 09:25:23 ?-------------------------------------------------------------------------------- ?Activities of Daily Living Details ?Patient Name: Date of Service: ?[name], [name] 02/02/2022 9:00 A M ?Medical Record Number: [id number] ?Patient Account Number: [id number] ?[name]: Treating RN: ?[birth year] (71 y.o. M) [name], [name] ?Primary Care Domonick Sittner: CA MPBELL, STEPHEN Other Clinician: ?Referring Garrett Salas: ?Treating Weldon Nouri/Extender: Garrett Salas ?CA MPBELL, STEPHEN ?Weeks in Treatment: 0 ?Activities of Daily Living Items ?Answer ?Activities of Daily Living (Please select one for each item) ?Drive Automobile Not Able ?T Medications ?ake Completely Able ?Use T elephone Completely Able ?Care for Appearance Need Assistance ?Use T oilet Need Assistance ?Bath / Shower Need Assistance ?Dress Self Need Assistance ?Feed Self Need Assistance ?Walk Need Assistance ?Get In / Out Bed Need Assistance ?Housework Need Assistance ?Prepare Meals Need Assistance ?Handle Money Need Assistance ?Shop for Self Need Assistance ?Electronic Signature(s) ?Signed: 02/02/2022 4:35:29 PM By: Deon Pilling RN,  BSN ?Entered By: Deon Pilling on 02/02/2022 09:25:45 ?-------------------------------------------------------------------------------- ?Education Screening Details ?Patient Name: ?Date of Service: ?[name], [name] 02/02/2022 9:00 A M ?Medical Record Number: [id number] ?Patient Account Number: [id number] ?[name]: ?Treating RN: ?03-03-1951 (71 y.o. M) [name], [name] ?Primary Care Garrett Salas: CA MPBELL, STEPHEN ?Other Clinician: ?Referring Garrett Salas: ?Treating Garrett Salas/Extender: Garrett Salas ?CA MPBELL, STEPHEN ?Weeks in Treatment: 0 ?Primary Learner Assessed: Patient ?Learning Preferences/Education Level/Primary Language ?Learning Preference: Explanation, Demonstration, Printed Material ?Highest Education Level: High School ?Preferred Language: English ?Cognitive Barrier ?Language Barrier: No ?Translator Needed: No ?Memory Deficit: No ?Emotional Barrier: No ?Cultural/Religious Beliefs Affecting Medical Care: No ?Physical Barrier ?Impaired Vision: Yes ?Impaired Hearing: No ?Decreased Hand dexterity: Yes ?Knowledge/Comprehension ?Knowledge Level: High ?Comprehension Level: High ?Ability to understand written instructions: High ?Ability to understand verbal instructions: High ?Motivation ?Anxiety Level: Calm ?Cooperation: Cooperative ?Education Importance: Acknowledges Need ?Interest in Health Problems: Asks Questions ?Perception: Coherent ?Willingness to Engage in Self-Management High ?Activities: ?Readiness to Engage in Self-Management High ?Activities: ?Electronic Signature(s) ?Signed: 02/02/2022 4:35:29 PM By: Deon Pilling RN, BSN ?Entered By: Deon Pilling on 02/02/2022 09:26:28 ?-------------------------------------------------------------------------------- ?Fall Risk Assessment Details ?Patient Name: ?Date of Service: ?[name], [name] 02/02/2022 9:00 A M ?Medical Record Number: [id number] ?Patient Account Number: [id number] ?[name]: ?Treating RN: ?09/09/1951 (71 y.o. M) [name],  [name] ?Primary Care Berdie Malter: CA MPBELL, STEPHEN ?Other Clinician: ?Referring Garrett Salas: ?Treating Garrett Salas/Extender: Garrett Salas ?CA MPBELL, STEPHEN ?Weeks in Treatment: 0 ?Fall Risk Assessment Items ?Have you had 2 or more falls in the last 12 monthso 0 Yes ?Have you had any fall that resulted in injury in the last 12 monthso 0 Yes ?FALLS RISK SCREEN ?History of falling - immediate or within 3 months 25 Yes ?Secondary diagnosis (Do you have 2 or more medical diagnoseso) 0 No ?Ambulatory aid ?None/bed rest/wheelchair/nurse 0  Yes ?Crutches/cane/walker 0 No ?Furniture 0 No ?Intravenous therapy Access/Saline/Heparin Lock 0 No ?Gait/Transferring ?Normal/ bed rest/ wheelchair 0 Yes ?Weak (short steps with or without shuffle, stooped but able to lift head while walking, may seek 0 No ?support from furniture) ?Impaired (short steps with shuffle, may have difficulty arising from chair, head down, impaired 0 No ?balance) ?Mental Status ?Oriented to own ability 0 Yes ?Electronic Signature(s) ?Signed: 02/02/2022 4:35:29 PM By: Deon Pilling RN, BSN ?Entered By: Deon Pilling on 02/02/2022 09:26:58 ?-------------------------------------------------------------------------------- ?Foot Assessment Details ?Patient Name: ?Date of Service: ?[name], [name] 02/02/2022 9:00 A M ?Medical Record Number: [id number] ?Patient Account Number: [id number] ?[name]: ?Treating RN: ?Jun 15, 1951 (71 y.o. M) [name], [name] ?Primary Care Garrett Salas: CA MPBELL, STEPHEN ?Other Clinician: ?Referring Garrett Salas: ?Treating Garrett Salas/Extender: Garrett Salas ?CA MPBELL, STEPHEN ?Weeks in Treatment: 0 ?Foot Assessment Items ?Site Locations ?+ = Sensation present, - = Sensation absent, C = Callus, U = Ulcer ?R = Redness, W = Warmth, M = Maceration, PU = Pre-ulcerative lesion ?F = Fissure, S = Swelling, D = Dryness ?Assessment ?Right: Left: ?Other Deformity: No No ?Prior Foot Ulcer: No No ?Prior Amputation: Yes No ?Charcot Joint: No  No ?Ambulatory Status: Non-ambulatory ?Assistance Device: Wheelchair ?Gait: ?Notes ?Bilateral BKA ?Electronic Signature(s) ?Signed: 02/02/2022 4:35:29 PM By: Deon Pilling RN, BSN ?Entered By: Deon Pilling on 02/02/2022 09:27:25 ?-------------------------------------------------------------------------------- ?Nutrition Risk Screening Details ?Patient Name: ?Date of Service: ?[name], [name] 02/02/2022 9:00 A M ?Medical Record Number: [id number] ?Patient Account Number: [id number] ?[name]: ?Treating RN: ?06/18/1951 (71 y.o. M) [name], [name] ?Primary Care Emmanuelle Hibbitts: CA MPBELL, STEPHEN ?Other Clinician: ?Referring Pleas Carneal: ?Treating Maki Hege/Extender: Garrett Salas ?CA MPBELL, STEPHEN ?Weeks in Treatment: 0 ?Height (in): 64 ?Weight (lbs): 144 ?Body Mass Index (BMI): 24.7 ?Nutrition Risk Screening Items ?Score Screening ?NUTRITION RISK SCREEN: ?I have an illness or condition that made me change the kind and/or amount of food I eat 2 Yes ?I eat fewer than two meals per day 0 No ?I eat few fruits and vegetables, or milk products 0 No ?I have three or more drinks of beer, liquor or wine almost every day 0 No ?I have tooth or mouth problems that make it hard for me to eat 0 No ?I don't always have enough money to buy the food I need 0 No ?I eat alone most of the time 0 No ?I take three or more different prescribed or over-the-counter drugs a day 1 Yes ?Without wanting to, I have lost or gained 10 pounds in the last six months 0 No ?I am not always physically able to shop, cook and/or feed myself 2 Yes ?Nutrition Protocols ?Good Risk Protocol ?Provide education on elevated blood ?Moderate Risk Protocol 0 sugars and impact on wound healing, ?as applicable ?High Risk Proctocol ?Risk Level: Moderate Risk ?Score: 5 ?Electronic Signature(s) ?Signed: 02/02/2022 4:35:29 PM By: Deon Pilling RN, BSN ?Entered By: Deon Pilling on 02/02/2022 09:27:07 ?

## 2022-02-02 NOTE — Progress Notes (Signed)
HOBART, MARTE ([id number]) ?Visit Report for 02/02/2022 ?Chief Complaint Document Details ?Patient Name: Date of Service: ?[name], [name] 02/02/2022 9:00 A M ?Medical Record Number: [id number] ?Patient Account Number: [id number] ?[name]: Treating RN: ?Jan 05, 1951 (71 y.o. M) ?Primary Care Provider: CA MPBELL, STEPHEN Other Clinician: ?Referring Provider: ?Treating Provider/Extender: Kalman Shan ?CA MPBELL, STEPHEN ?Weeks in Treatment: 0 ?Information Obtained from: Patient ?Chief Complaint ?Bilateral lower extremity wounds ?Electronic Signature(s) ?Signed: 02/02/2022 10:26:54 AM By: Kalman Shan DO ?Entered By: Kalman Shan on 02/02/2022 10:18:17 ?-------------------------------------------------------------------------------- ?HPI Details ?Patient Name: Date of Service: ?[name], [name] 02/02/2022 9:00 A M ?Medical Record Number: [id number] ?Patient Account Number: [id number] ?[name]: Treating RN: ?03-02-1951 (71 y.o. M) ?Primary Care Provider: CA MPBELL, STEPHEN Other Clinician: ?Referring Provider: ?Treating Provider/Extender: Kalman Shan ?CA MPBELL, STEPHEN ?Weeks in Treatment: 0 ?History of Present Illness ?HPI Description: 02/02/2022 ?Mr. Garrett Salas is a 71 year old male with a past medical history of COPD current tobacco user, type 2 diabetes, peripheral arterial disease, bilateral BKA, ?end-stage renal disease on hemodialysis that presents the clinic for wounds to his left knee and anterior right shin. He states that he has not been using the ?wheelchair and walking on his knees. He developed the wounds a few months ago and has been keeping them open to air. He reports chronic pain to the wound ?beds. He currently denies systemic signs of infection. ?Electronic Signature(s) ?Signed: 02/02/2022 10:26:54 AM By: Kalman Shan DO ?Entered By: Kalman Shan on 02/02/2022  10:20:17 ?-------------------------------------------------------------------------------- ?Physical Exam Details ?Patient Name: Date of Service: ?[name], [name] 02/02/2022 9:00 A M ?Medical Record Number: [id number] ?Patient Account Number: [id number] ?[name]: Treating RN: ?Jul 30, 1951 (71 y.o. M) ?Primary Care Provider: CA MPBELL, STEPHEN Other Clinician: ?Referring Provider: ?Treating Provider/Extender: Kalman Shan ?CA MPBELL, STEPHEN ?Weeks in Treatment: 0 ?Constitutional ?respirations regular, non-labored and within target range for patient.Marland Kitchen ?Psychiatric ?pleasant and cooperative. ?Notes ?Left knee: Wound with nonviable tissue throughout. T ender to palpation. No surrounding signs of soft tissue infection ?Right lower extremity: T the anterior aspect there is an open wound that probes to bone. No surrounding soft tissue infection. ?o ?Electronic Signature(s) ?Signed: 02/02/2022 10:26:54 AM By: Kalman Shan DO ?Entered By: Kalman Shan on 02/02/2022 10:21:05 ?-------------------------------------------------------------------------------- ?Physician Orders Details ?Patient Name: Date of Service: ?[name], [name] 02/02/2022 9:00 A M ?Medical Record Number: [id number] ?Patient Account Number: [id number] ?[name]: Treating RN: ?August 12, 1951 (71 y.o. 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 ?A referral to orthopedics office -[facility] someone will call you with an appointment. Call their office if they do not call you within two days call ?them at [phone]. ?Pick up Dakins from your pharmacy. ?Follow up with Primary care provider for your elevated blood pressure. ?Licensed conveyancer ?May shower with protection but do not get wound dressing(s) wet. ?Off-Loading ?Other: - ensure no pressure  to wound areas. ?Home Health ?New wound care orders this week; continue Home Health for wound care. May utilize formulary equivalent dressing for wound treatment ?orders unless otherwise specified. - change three times a week. Patient to schedule an appointment with orthopedics. ?Other Home Health Orders/Instructions: - [facility] home [facility]. ?Wound Treatment ?Wound #1 - Lower Leg Wound Laterality: Right, Anterior ?Cleanser: Wound Cleanser [facility]) Every Other Day/30 Days ?Discharge Instructions: Cleanse the wound with wound cleanser prior to applying a clean dressing using gauze sponges, not tissue or cotton balls. ?Prim Dressing: Dakin's Solution [facility]) Every Other Day/30 Days ?ary ?Discharge Instructions: **patient to pick up from pharmacy the solution. Dakin's moisten gauze lightly applied to wound bed. ?Secondary Dressing: Woven Gauze Sponge, Non-Sterile 4x4 in [facility]) Every Other Day/30 Days ?Discharge Instructions: Apply over primary dressing as directed. ?Secured With: [employer], 4.5x3.1 (in/yd) [facility]) Every Other Day/30 Days ?Discharge Instructions: Secure with Kerlix as directed. ?Secured With: 12M Medipore H Soft Cloth Surgical T ape, 4 x 10 (in/yd) (Home Health) Every Other Day/30 Days ?Discharge Instructions: Secure with tape as directed. ?Wound #2 - Knee Wound Laterality: Left ?Cleanser: Wound Cleanser [facility]) Every Other Day/30 Days ?Discharge Instructions: Cleanse the wound with  wound cleanser prior to applying a clean dressing using gauze sponges, not tissue or cotton balls. ?Prim Dressing: Dakin's Solution [facility]) Every Other Day/30 Days ?ary ?Discharge Instructions: **patient to pick up from pharmacy the solution. Dakin's moisten gauze lightly applied to wound bed. ?Secondary Dressing: Woven Gauze Sponge, Non-Sterile 4x4 in [facility]) Every Other Day/30 Days ?Discharge Instructions: Apply over primary dressing as directed. ?Secured With: [employer], 4.5x3.1 (in/yd) [facility]) Every Other Day/30 Days ?Discharge Instructions: Secure with Kerlix as directed. ?Secured With: 12M Medipore H Soft Cloth Surgical T ape, 4 x 10 (in/yd) (Home Health) Every Other Day/30 Days ?Discharge Instructions: Secure with tape as directed. ?Custom Services ?orthopedics-[facility] - Referral to [facility] related to bilateral BKA wounds noted to both lower legs and left knee. Bone exposed to right ?leg. ?Electronic Signature(s) ?Signed: 02/02/2022 10:26:54 AM By: Kalman Shan DO ?Entered By: Kalman Shan on 02/02/2022 10:21:25 ?Prescription 02/02/2022 ?-------------------------------------------------------------------------------- ?Elam Dutch D. Kalman Shan DO ?Patient Name: Provider: ?12/21/1950 [id number] ?Date of Birth: NPI#: ?Jerilynn Mages [id number] ?Sex: DEA #: ?[phone] ?Phone #: License #: ?[place] ?Patient Address: ?[address] ?[place], [place] [address] [address] ?[place], [place] [address] ?[phone] ?Allergies ?lisinopril; aspirin ?Provider's Orders ?orthopedics-[facility] - Referral to [facility] related to bilateral BKA wounds noted to both lower legs and left knee. Bone exposed to right ?leg. ?Hand Signature: ?Date(s): ?Electronic Signature(s) ?Signed: 02/02/2022 10:26:54 AM By: Kalman Shan DO ?Entered By: Kalman Shan on 02/02/2022  10:21:26 ?-------------------------------------------------------------------------------- ?Problem List Details ?Patient Name: ?Date of Service: ?[name], [name] 02/02/2022 9:00 A M ?Medical Record Number: [id number] ?Patient Account Number: [id number] ?[name]: ?Treating RN: ?December 27, 1950 (71 y.o. M) ?Primary Care Provider: CA MPBELL, STEPHE

## 2022-02-04 ENCOUNTER — Other Ambulatory Visit: Payer: Self-pay | Admitting: *Deleted

## 2022-02-04 NOTE — Patient Outreach (Signed)
[facility]) Care Management ? ?02/04/2022 ? ?Garrett Salas ?[birth year] ?[id number] ? ? ?RED ON EMMI ALERT - Stroke ? ?Day # 1 ?Date: 3/16 ?Red Alert Reason: Scheduled follow up appointment? NO ?Filled new prescriptions?  NO ? ?Day # 6 ?Date: 3/21 ?Red Alert Reason: Feeling worse overall?  YES ? ? ?Outreach attempt #2, unsuccessful.  Unable to leave voice message, mailbox full.   ? ? ?Plan: ?RN CM will follow up within the next 3-4 business days. ? ?Valente David, RN, MSN, CCM ?[facility] Care Management  ?Community Care Manager ?[phone] ? ?

## 2022-02-05 ENCOUNTER — Ambulatory Visit: Payer: Self-pay | Admitting: *Deleted

## 2022-02-10 ENCOUNTER — Other Ambulatory Visit: Payer: Self-pay | Admitting: *Deleted

## 2022-02-10 NOTE — Patient Outreach (Signed)
[facility]) Care Management ? ?02/10/2022 ? ?[place] ?1951-10-18 ?[id number] ? ? ?RED ON EMMI ALERT - Stroke ?  ?Day # 1 ?Date: 3/16 ?Red Alert Reason: Scheduled follow up appointment? NO ?Filled new prescriptions?  NO ?  ?Day # 6 ?Date: 3/21 ?Red Alert Reason: Feeling worse overall?  YES ?  ?  ?Outreach attempt #3, unsuccessful.  Unable to leave voice message.   ?  ?  ?Plan:Will make 4th and final attempt within the next 3 weeks, if remain unsuccessful will close case due to inability to maintain contact. ? ?Valente David, RN, MSN, CCM ?[facility] Care Management  ?Community Care Manager ?[phone] ? ?

## 2022-02-12 ENCOUNTER — Ambulatory Visit (INDEPENDENT_AMBULATORY_CARE_PROVIDER_SITE_OTHER): Payer: Medicare (Managed Care) | Admitting: Orthopedic Surgery

## 2022-02-12 ENCOUNTER — Encounter: Payer: Self-pay | Admitting: Orthopedic Surgery

## 2022-02-12 DIAGNOSIS — I70263 Atherosclerosis of native arteries of extremities with gangrene, bilateral legs: Secondary | ICD-10-CM | POA: Diagnosis not present

## 2022-02-12 DIAGNOSIS — I70262 Atherosclerosis of native arteries of extremities with gangrene, left leg: Secondary | ICD-10-CM

## 2022-02-12 DIAGNOSIS — I70261 Atherosclerosis of native arteries of extremities with gangrene, right leg: Secondary | ICD-10-CM

## 2022-02-12 NOTE — Progress Notes (Signed)
? ?Office Visit Note ?  ?Patient: Garrett Salas           ?Date of Birth: 06/15/1951           ?MRN: [id number] ?Visit Date: 02/12/2022 ?             ?Requested by: [name], MD ?[place],  [place] [phone] ?PCP: [name], MD ? ?Chief Complaint  ?Patient presents with  ? Right Leg - Wound Check  ?  Hx right BKA  ? Left Leg - Wound Check  ?  Hx left BKA  ? ? ? ? ?HPI: ?Patient is a 71 year old gentleman who presents complaining of a large necrotic ulcer and exposed patella on the left knee.  Patient has bilateral transtibial amputation and has painful ischemia to the right transtibial amputation. ? ?Patient undergoes dialysis Tuesday Thursday Saturday he states he did not have dialysis today so he could come to his appointment. ? ?Patient has COPD he is currently a smoker he is diabetic with peripheral artery disease and end-stage renal disease on dialysis.  Patient currently cannot wear his prosthesis secondary to pain.  He states this has been going on for months ? ?Assessment & Plan: ?Visit Diagnoses:  ?1. Atherosclerosis of native arteries of extremities with gangrene, left leg ([place])   ?2. Atherosclerosis of native arteries of extremities with gangrene, right leg (HCC)   ? ? ?Plan: Discussed that with the necrotic ulcer over the patella on the left his only option is a above-the-knee amputation on the left.  Discussed that with the ischemic pain of the right transtibial amputation his best treatment option would be a above-the-knee amputation on the right as well.  We will plan for bilateral above-the-knee amputations.  We will plan for surgery next Friday. ? ?Follow-Up Instructions: Return in about 2 weeks (around 03/17/2022).  ? ?Ortho Exam ? ?Patient is alert, oriented, no adenopathy, well-dressed, normal affect, normal respiratory effort. ?Examination patient's bilateral transtibial amputations are cold to the touch.  Patient has necrotic patella on the left  with a large necrotic ulcer over the medial aspect of the left knee.  Light touch of the right transtibial amputation is painful and there are small ulcers with ischemic changes. ? ?Imaging: ?No results found. ?No images are attached to the encounter. ? ?Labs: ?Lab Results  ?Component Value Date  ? HGBA1C 5.9 (H) 01/24/2022  ? HGBA1C 4.9 09/04/2018  ? HGBA1C 6.2 (H) 05/14/2015  ? REPTSTATUS 09/08/2018 FINAL 09/03/2018  ? CULT  09/03/2018  ?  NO GROWTH 5 DAYS ?Performed at [facility], [place] [address], [place], [place] [address] ?  ? ? ? ?Lab Results  ?Component Value Date  ? ALBUMIN 3.0 (L) 01/27/2022  ? ALBUMIN 3.2 (L) 01/24/2022  ? ALBUMIN 3.3 (L) 06/12/2019  ? PREALBUMIN 15.0 (L) 09/04/2018  ? ? ?Lab Results  ?Component Value Date  ? MG 2.1 01/24/2022  ? MG 1.9 09/04/2018  ? MG 2.6 (H) 05/16/2015  ? ?No results found for: VD25OH ? ?Lab Results  ?Component Value Date  ? PREALBUMIN 15.0 (L) 09/04/2018  ? ? ?  Latest Ref Rng & Units 01/27/2022  ?  8:36 AM 01/25/2022  ?  5:55 PM 01/24/2022  ? 11:39 AM  ?CBC EXTENDED  ?WBC 4.0 - 10.5 K/uL 4.2   5.5   6.3    ?RBC 4.22 - 5.81 MIL/uL 3.47   4.06   4.17    ?Hemoglobin  13.0 - 17.0 g/dL 10.3   12.3   12.5    ?HCT 39.0 - 52.0 % 32.6   37.6   38.4    ?Platelets 150 - 400 K/uL 139   160   143    ?NEUT# 1.7 - 7.7 K/uL   4.5    ?Lymph# 0.7 - 4.0 K/uL   0.9    ? ? ? ?There is no height or weight on file to calculate BMI. ? ?Orders:  ?No orders of the defined types were placed in this encounter. ? ?No orders of the defined types were placed in this encounter. ? ? ? Procedures: ?No procedures performed ? ?Clinical Data: ?No additional findings. ? ?ROS: ? ?All other systems negative, except as noted in the HPI. ?Review of Systems ? ?Objective: ?Vital Signs: There were no vitals taken for this visit. ? ?Specialty Comments:  ?No specialty comments available. ? ?PMFS History: ?Patient Active Problem List  ? Diagnosis Date Noted  ? Lacunar infarct, acute ([place]) 01/24/2022  ? Sepsis  ([place]) 09/03/2018  ? Hypoxia 09/03/2018  ? Chest pain 09/03/2018  ? COPD exacerbation ([place]) 09/03/2018  ? Tobacco abuse 06/04/2017  ? Marijuana abuse, continuous 01/28/2016  ? Pneumonia involving right lung 01/28/2016  ? Thrombocytopenia ([place]) 01/28/2016  ? Acute respiratory failure with hypoxia ([place]) 01/28/2016  ? Elevated troponin I level 01/28/2016  ? Benign essential HTN 01/28/2016  ? S/P bilateral BKA (below knee amputation) ([place]) 01/28/2016  ? Type 2 diabetes mellitus ([place]) 01/28/2016  ? Phantom limb pain- Left stump 06/21/2015  ? Postoperative anemia due to acute blood loss 05/18/2015  ? Protein-calorie malnutrition, severe ([place]) 05/17/2015  ? Critical lower limb ischemia (HCC)   ? ESRD on hemodialysis ([place])   ? Hypertensive urgency   ? Acute encephalopathy   ? Elevated troponin   ? Generalized weakness 05/14/2015  ? Gangrene of foot ([place]) 05/14/2015  ? Hypertensive heart disease 05/14/2015  ? Gangrene of lower extremity ([place]) 05/14/2015  ? S/P BKA (below knee amputation) bilateral ([place]) 02/07/2015  ? PAD (peripheral artery disease) ([place]) 01/07/2015  ? ESRD on dialysis ([place]) 01/04/2015  ? HLD (hyperlipidemia) 01/04/2015  ? DM (diabetes mellitus) type II, controlled, with peripheral vascular disorder ([place]) 01/04/2015  ? Atherosclerosis of native arteries of the extremities with gangrene ([place]) 01/04/2015  ? Type 2 diabetes mellitus with diabetic peripheral angiopathy without gangrene, with long-term current use of insulin ([place]) 01/04/2015  ? Mixed hyperlipidemia 01/04/2015  ? Dyspnea 05/18/2011  ? Hypertension   ? ?Past Medical History:  ?Diagnosis Date  ? COPD (chronic obstructive pulmonary disease) ([place])   ? Diabetes mellitus   ? Type 2. Diagnosed in mid 82s. Currently insulin requiring  ? Diabetic neuropathy ([place])   ? Diabetic retinopathy   ? legally blind  ? Dialysis patient [facility])   ? ESRD (end stage renal disease) on dialysis [facility])   ? due to diabetic nephropathy. T/Th/Sat  ? Gangrene ([place])   ? right index   ? History of blood transfusion   ? Hx of right BKA ([place])   ? Hyperlipidemia   ? Hypertension   ? Peripheral vascular disease ([place])   ? Tobacco abuse   ? Wears dentures   ?  ?Family History  ?Problem Relation Age of Onset  ? Diabetes Mother   ? Heart disease Other   ? Alcohol abuse Other   ? Diabetes Sister   ? Diabetes Brother   ?  ?Past Surgical History:  ?Procedure Laterality Date  ?  A/V FISTULAGRAM N/A 05/22/2019  ? Procedure: A/V FISTULAGRAM - Right Arm;  Surgeon: Waynetta Sandy, MD;  Location: [facility];  Service: Cardiovascular;  Laterality: N/A;  ? ABDOMINAL AORTAGRAM N/A 01/07/2015  ? Procedure: ABDOMINAL AORTAGRAM;  Surgeon: Conrad Southmayd, MD;  Location: [facility] LAB;  Service: Cardiovascular;  Laterality: N/A;  ? AMPUTATION Right 01/09/2015  ? Procedure: AMPUTATION BELOW KNEE RIGHT LEG;  Surgeon: Conrad [place], MD;  Location: [place];  Service: Vascular;  Laterality: Right;  ? AMPUTATION Left 05/16/2015  ? Procedure: LEFT BELOW THE KNEE AMPUTATION;  Surgeon: Conrad [place], MD;  Location: [place];  Service: Vascular;  Laterality: Left;  ? AMPUTATION Right 06/12/2019  ? Procedure: AMPUTATION RAY RIGHT INDEX FINGER;  Surgeon: Leanora Cover, MD;  Location: [place];  Service: Orthopedics;  Laterality: Right;  ? AMPUTATION Right 06/26/2019  ? Procedure: RIGHT SMALL FINGER AMPUTATION;  Surgeon: Leanora Cover, MD;  Location: [facility];  Service: Orthopedics;  Laterality: Right;  ? AV FISTULA PLACEMENT    ? CARDIAC CATHETERIZATION  05/06/2011  ? no significant CAD, EF 55-60%, LVEDP :15   ? INSERTION OF DIALYSIS CATHETER N/A 05/24/2019  ? Procedure: INSERTION OF DIALYSIS CATHETER;  Surgeon: Waynetta Sandy, MD;  Location: [place];  Service: Vascular;  Laterality: N/A;  ? LIGATION OF ARTERIOVENOUS  FISTULA Right 05/24/2019  ? Procedure: LIGATION OF ARTERIOVENOUS  FISTULA RIGHT ARM;  Surgeon: Waynetta Sandy, MD;  Location: [place];  Service: Vascular;  Laterality: Right;  ? MULTIPLE TOOTH EXTRACTIONS    ?  none    ? UPPER EXTREMITY ANGIOGRAPHY Right 05/22/2019  ? Procedure: Upper Extremity Angiography;  Surgeon: Waynetta Sandy, MD;  Location: [facility];  Service: Cardiovascular;  Josph Macho

## 2022-02-19 ENCOUNTER — Encounter (HOSPITAL_COMMUNITY): Payer: Self-pay | Admitting: Orthopedic Surgery

## 2022-02-19 ENCOUNTER — Other Ambulatory Visit: Payer: Self-pay

## 2022-02-19 MED ORDER — TRANEXAMIC ACID 1000 MG/10ML IV SOLN
2000.0000 mg | INTRAVENOUS | Status: DC
  Filled 2022-02-19: qty 20

## 2022-02-19 NOTE — Progress Notes (Addendum)
Mr. Pargas states that he is doing ok and he asked me to speak to his wife.   Mrs. Marohl stated that Mr. Saavedra has not had complaints of chest pain.  Patient denies having any s/s of Covid in her household.  Patient denies any known exposure to Covid.  ? ?Mr. Hebard has type II diabetes, his not taking medications for diabetes, patient does not check CBG.  Hgb A1C was 5.9 on 01/24/22. ? ?PCP is Dr. Almon Register in [place]. ? ?I instructed Mrs. Worland to have Mr. Gater to wash up well  with antibacteria soap.  No nail polish, artificial or acrylic nails. Wear clean clothes, brush your teeth. ?Glasses, contact lens,dentures or partials may not be worn in the OR. If you need to wear them, please bring a case for glasses, do not wear contacts or bring a case, the hospital does not have contact cases, dentures or partials will have to be removed , make sure they are clean, we will provide a denture cup to put them in. You will need some one to drive you home and a responsible person over the age of 94 to stay with you for the first 24 hours after surgery.  ?

## 2022-02-20 ENCOUNTER — Encounter (HOSPITAL_COMMUNITY): Admission: RE | Disposition: E | Payer: Self-pay | Source: Home / Self Care | Attending: Orthopedic Surgery

## 2022-02-20 ENCOUNTER — Inpatient Hospital Stay (HOSPITAL_COMMUNITY): Payer: Medicare (Managed Care) | Admitting: General Practice

## 2022-02-20 ENCOUNTER — Other Ambulatory Visit: Payer: Self-pay

## 2022-02-20 ENCOUNTER — Inpatient Hospital Stay (HOSPITAL_COMMUNITY)
Admission: RE | Admit: 2022-02-20 | Discharge: 2022-03-16 | DRG: 239 | Disposition: E | Payer: Medicare (Managed Care) | Attending: Orthopedic Surgery | Admitting: Orthopedic Surgery

## 2022-02-20 ENCOUNTER — Encounter (HOSPITAL_COMMUNITY): Payer: Self-pay | Admitting: Orthopedic Surgery

## 2022-02-20 DIAGNOSIS — E1122 Type 2 diabetes mellitus with diabetic chronic kidney disease: Secondary | ICD-10-CM | POA: Diagnosis present

## 2022-02-20 DIAGNOSIS — D631 Anemia in chronic kidney disease: Secondary | ICD-10-CM | POA: Diagnosis present

## 2022-02-20 DIAGNOSIS — Z66 Do not resuscitate: Secondary | ICD-10-CM | POA: Diagnosis present

## 2022-02-20 DIAGNOSIS — E785 Hyperlipidemia, unspecified: Secondary | ICD-10-CM | POA: Diagnosis present

## 2022-02-20 DIAGNOSIS — I472 Ventricular tachycardia, unspecified: Secondary | ICD-10-CM | POA: Diagnosis not present

## 2022-02-20 DIAGNOSIS — E11319 Type 2 diabetes mellitus with unspecified diabetic retinopathy without macular edema: Secondary | ICD-10-CM | POA: Diagnosis present

## 2022-02-20 DIAGNOSIS — F17210 Nicotine dependence, cigarettes, uncomplicated: Secondary | ICD-10-CM | POA: Diagnosis present

## 2022-02-20 DIAGNOSIS — Z7984 Long term (current) use of oral hypoglycemic drugs: Secondary | ICD-10-CM | POA: Diagnosis not present

## 2022-02-20 DIAGNOSIS — E875 Hyperkalemia: Secondary | ICD-10-CM | POA: Diagnosis present

## 2022-02-20 DIAGNOSIS — Z89611 Acquired absence of right leg above knee: Secondary | ICD-10-CM | POA: Diagnosis not present

## 2022-02-20 DIAGNOSIS — N186 End stage renal disease: Secondary | ICD-10-CM | POA: Diagnosis present

## 2022-02-20 DIAGNOSIS — Z888 Allergy status to other drugs, medicaments and biological substances status: Secondary | ICD-10-CM

## 2022-02-20 DIAGNOSIS — E1152 Type 2 diabetes mellitus with diabetic peripheral angiopathy with gangrene: Secondary | ICD-10-CM | POA: Diagnosis present

## 2022-02-20 DIAGNOSIS — J449 Chronic obstructive pulmonary disease, unspecified: Secondary | ICD-10-CM | POA: Diagnosis present

## 2022-02-20 DIAGNOSIS — I70263 Atherosclerosis of native arteries of extremities with gangrene, bilateral legs: Secondary | ICD-10-CM | POA: Diagnosis present

## 2022-02-20 DIAGNOSIS — F0390 Unspecified dementia without behavioral disturbance: Secondary | ICD-10-CM | POA: Diagnosis present

## 2022-02-20 DIAGNOSIS — M898X9 Other specified disorders of bone, unspecified site: Secondary | ICD-10-CM | POA: Diagnosis present

## 2022-02-20 DIAGNOSIS — Z992 Dependence on renal dialysis: Secondary | ICD-10-CM | POA: Diagnosis not present

## 2022-02-20 DIAGNOSIS — H548 Legal blindness, as defined in USA: Secondary | ICD-10-CM | POA: Diagnosis present

## 2022-02-20 DIAGNOSIS — Z886 Allergy status to analgesic agent status: Secondary | ICD-10-CM | POA: Diagnosis not present

## 2022-02-20 DIAGNOSIS — E1140 Type 2 diabetes mellitus with diabetic neuropathy, unspecified: Secondary | ICD-10-CM | POA: Diagnosis present

## 2022-02-20 DIAGNOSIS — Z79899 Other long term (current) drug therapy: Secondary | ICD-10-CM | POA: Diagnosis not present

## 2022-02-20 DIAGNOSIS — Z89612 Acquired absence of left leg above knee: Secondary | ICD-10-CM | POA: Diagnosis not present

## 2022-02-20 DIAGNOSIS — K5900 Constipation, unspecified: Secondary | ICD-10-CM | POA: Diagnosis present

## 2022-02-20 DIAGNOSIS — I120 Hypertensive chronic kidney disease with stage 5 chronic kidney disease or end stage renal disease: Secondary | ICD-10-CM | POA: Diagnosis present

## 2022-02-20 DIAGNOSIS — Z8249 Family history of ischemic heart disease and other diseases of the circulatory system: Secondary | ICD-10-CM

## 2022-02-20 DIAGNOSIS — Z833 Family history of diabetes mellitus: Secondary | ICD-10-CM | POA: Diagnosis not present

## 2022-02-20 DIAGNOSIS — T8754 Necrosis of amputation stump, left lower extremity: Secondary | ICD-10-CM | POA: Diagnosis not present

## 2022-02-20 HISTORY — DX: Unspecified dementia, unspecified severity, without behavioral disturbance, psychotic disturbance, mood disturbance, and anxiety: F03.90

## 2022-02-20 HISTORY — DX: Dyspnea, unspecified: R06.00

## 2022-02-20 HISTORY — DX: Other psychoactive substance abuse, uncomplicated: F19.10

## 2022-02-20 HISTORY — DX: Depression, unspecified: F32.A

## 2022-02-20 HISTORY — PX: AMPUTATION: SHX166

## 2022-02-20 LAB — BASIC METABOLIC PANEL
Anion gap: 16 — ABNORMAL HIGH (ref 5–15)
BUN: 42 mg/dL — ABNORMAL HIGH (ref 8–23)
CO2: 19 mmol/L — ABNORMAL LOW (ref 22–32)
Calcium: 8.9 mg/dL (ref 8.9–10.3)
Chloride: 101 mmol/L (ref 98–111)
Creatinine, Ser: 7.53 mg/dL — ABNORMAL HIGH (ref 0.61–1.24)
GFR, Estimated: 7 mL/min — ABNORMAL LOW (ref >60)
Glucose, Bld: 102 mg/dL — ABNORMAL HIGH (ref 70–99)
Potassium: 5.3 mmol/L — ABNORMAL HIGH (ref 3.5–5.1)
Sodium: 136 mmol/L (ref 135–145)

## 2022-02-20 LAB — GLUCOSE, CAPILLARY
Glucose-Capillary: 121 mg/dL — ABNORMAL HIGH (ref 70–99)
Glucose-Capillary: 103 mg/dL — ABNORMAL HIGH (ref 70–99)
Glucose-Capillary: 103 mg/dL — ABNORMAL HIGH (ref 70–99)
Glucose-Capillary: 140 mg/dL — ABNORMAL HIGH (ref 70–99)

## 2022-02-20 LAB — POCT I-STAT, CHEM 8
BUN: 62 mg/dL — ABNORMAL HIGH (ref 8–23)
BUN: 41 mg/dL — ABNORMAL HIGH (ref 8–23)
Calcium, Ion: 0.93 mmol/L — ABNORMAL LOW (ref 1.15–1.40)
Calcium, Ion: 0.99 mmol/L — ABNORMAL LOW (ref 1.15–1.40)
Chloride: 104 mmol/L (ref 98–111)
Chloride: 104 mmol/L (ref 98–111)
Creatinine, Ser: 8.3 mg/dL — ABNORMAL HIGH (ref 0.61–1.24)
Creatinine, Ser: 8.2 mg/dL — ABNORMAL HIGH (ref 0.61–1.24)
Glucose, Bld: 106 mg/dL — ABNORMAL HIGH (ref 70–99)
Glucose, Bld: 107 mg/dL — ABNORMAL HIGH (ref 70–99)
HCT: 40 % (ref 39.0–52.0)
HCT: 39 % (ref 39.0–52.0)
Hemoglobin: 13.6 g/dL (ref 13.0–17.0)
Hemoglobin: 13.3 g/dL (ref 13.0–17.0)
Potassium: 6 mmol/L — ABNORMAL HIGH (ref 3.5–5.1)
Potassium: 5.2 mmol/L — ABNORMAL HIGH (ref 3.5–5.1)
Sodium: 133 mmol/L — ABNORMAL LOW (ref 135–145)
Sodium: 135 mmol/L (ref 135–145)
TCO2: 24 mmol/L (ref 22–32)
TCO2: 22 mmol/L (ref 22–32)

## 2022-02-20 LAB — SURGICAL PCR SCREEN
MRSA, PCR: NEGATIVE
Staphylococcus aureus: NEGATIVE

## 2022-02-20 LAB — PHOSPHORUS: Phosphorus: 6.4 mg/dL — ABNORMAL HIGH (ref 2.5–4.6)

## 2022-02-20 LAB — ALBUMIN: Albumin: 3.3 g/dL — ABNORMAL LOW (ref 3.5–5.0)

## 2022-02-20 SURGERY — AMPUTATION, ABOVE KNEE
Anesthesia: General | Site: Knee | Laterality: Bilateral

## 2022-02-20 MED ORDER — CEFAZOLIN SODIUM-DEXTROSE 1-4 GM/50ML-% IV SOLN
1.0000 g | Freq: Every day | INTRAVENOUS | Status: AC
  Administered 2022-02-20 – 2022-02-21 (×2): 1 g via INTRAVENOUS
  Filled 2022-02-20 (×2): qty 50

## 2022-02-20 MED ORDER — ACETAMINOPHEN 325 MG PO TABS
325.0000 mg | ORAL_TABLET | Freq: Four times a day (QID) | ORAL | Status: DC | PRN
  Filled 2022-02-20: qty 2

## 2022-02-20 MED ORDER — ONDANSETRON HCL 4 MG/2ML IJ SOLN
INTRAMUSCULAR | Status: DC | PRN
  Administered 2022-02-20: 4 mg via INTRAVENOUS

## 2022-02-20 MED ORDER — HYDROMORPHONE HCL 2 MG PO TABS
2.0000 mg | ORAL_TABLET | ORAL | Status: DC | PRN
  Filled 2022-02-20: qty 2

## 2022-02-20 MED ORDER — PROPOFOL 10 MG/ML IV BOLUS
INTRAVENOUS | Status: AC
  Filled 2022-02-20: qty 20

## 2022-02-20 MED ORDER — CALCITRIOL 0.25 MCG PO CAPS
0.2500 ug | ORAL_CAPSULE | ORAL | Status: DC
  Administered 2022-02-24: 0.25 ug via ORAL
  Filled 2022-02-20 (×2): qty 1

## 2022-02-20 MED ORDER — MAGNESIUM CITRATE PO SOLN
1.0000 | Freq: Once | ORAL | Status: DC | PRN
  Filled 2022-02-20: qty 296

## 2022-02-20 MED ORDER — ROCURONIUM BROMIDE 10 MG/ML (PF) SYRINGE
PREFILLED_SYRINGE | INTRAVENOUS | Status: AC
  Filled 2022-02-20: qty 10

## 2022-02-20 MED ORDER — CEFAZOLIN SODIUM-DEXTROSE 2-4 GM/100ML-% IV SOLN
2.0000 g | INTRAVENOUS | Status: AC
  Administered 2022-02-20: 2 g via INTRAVENOUS
  Filled 2022-02-20: qty 100

## 2022-02-20 MED ORDER — SODIUM CHLORIDE 0.9 % IV SOLN: INTRAVENOUS | Status: DC

## 2022-02-20 MED ORDER — CHLORHEXIDINE GLUCONATE CLOTH 2 % EX PADS
6.0000 | MEDICATED_PAD | Freq: Every day | CUTANEOUS | Status: DC
  Administered 2022-02-21 – 2022-02-23 (×3): 6 via TOPICAL

## 2022-02-20 MED ORDER — CHLORHEXIDINE GLUCONATE 0.12 % MT SOLN
15.0000 mL | Freq: Once | OROMUCOSAL | Status: AC
  Administered 2022-02-20: 15 mL via OROMUCOSAL
  Filled 2022-02-20: qty 15

## 2022-02-20 MED ORDER — BISACODYL 5 MG PO TBEC: 5.0000 mg | DELAYED_RELEASE_TABLET | Freq: Every day | ORAL | Status: DC | PRN

## 2022-02-20 MED ORDER — MIDAZOLAM HCL 2 MG/2ML IJ SOLN
INTRAMUSCULAR | Status: AC
  Filled 2022-02-20: qty 2

## 2022-02-20 MED ORDER — OXYCODONE HCL 5 MG PO TABS: 5.0000 mg | ORAL_TABLET | Freq: Once | ORAL | Status: DC | PRN

## 2022-02-20 MED ORDER — HYDROMORPHONE HCL 2 MG PO TABS
1.0000 mg | ORAL_TABLET | ORAL | Status: DC | PRN
  Filled 2022-02-20: qty 1

## 2022-02-20 MED ORDER — ALUM & MAG HYDROXIDE-SIMETH 200-200-20 MG/5ML PO SUSP: 15.0000 mL | ORAL | Status: DC | PRN

## 2022-02-20 MED ORDER — ONDANSETRON HCL 4 MG/2ML IJ SOLN
INTRAMUSCULAR | Status: AC
  Filled 2022-02-20: qty 2

## 2022-02-20 MED ORDER — AMLODIPINE BESYLATE 10 MG PO TABS
10.0000 mg | ORAL_TABLET | Freq: Every day | ORAL | Status: DC
  Administered 2022-02-20 – 2022-02-24 (×5): 10 mg via ORAL
  Filled 2022-02-20 (×7): qty 1

## 2022-02-20 MED ORDER — MIDAZOLAM HCL 2 MG/2ML IJ SOLN
INTRAMUSCULAR | Status: DC | PRN
  Administered 2022-02-20 (×2): 1 mg via INTRAVENOUS

## 2022-02-20 MED ORDER — HYDRALAZINE HCL 20 MG/ML IJ SOLN
5.0000 mg | INTRAMUSCULAR | Status: DC | PRN
  Administered 2022-02-20: 5 mg via INTRAVENOUS
  Filled 2022-02-20: qty 1

## 2022-02-20 MED ORDER — SUGAMMADEX SODIUM 200 MG/2ML IV SOLN
INTRAVENOUS | Status: DC | PRN
  Administered 2022-02-20: 200 mg via INTRAVENOUS

## 2022-02-20 MED ORDER — POVIDONE-IODINE 10 % EX SWAB
2.0000 "application " | Freq: Once | CUTANEOUS | Status: AC
  Administered 2022-02-20: 2 via TOPICAL

## 2022-02-20 MED ORDER — DOCUSATE SODIUM 100 MG PO CAPS
100.0000 mg | ORAL_CAPSULE | Freq: Every day | ORAL | Status: DC
  Administered 2022-02-21 – 2022-02-24 (×4): 100 mg via ORAL
  Filled 2022-02-20 (×6): qty 1

## 2022-02-20 MED ORDER — PHENOL 1.4 % MT LIQD: 1.0000 | OROMUCOSAL | Status: DC | PRN

## 2022-02-20 MED ORDER — OXYCODONE HCL 5 MG/5ML PO SOLN: 5.0000 mg | Freq: Once | ORAL | Status: DC | PRN

## 2022-02-20 MED ORDER — FENTANYL CITRATE (PF) 250 MCG/5ML IJ SOLN
INTRAMUSCULAR | Status: AC
  Filled 2022-02-20: qty 5

## 2022-02-20 MED ORDER — ORAL CARE MOUTH RINSE: 15.0000 mL | Freq: Once | OROMUCOSAL | Status: AC

## 2022-02-20 MED ORDER — JUVEN PO PACK
1.0000 | PACK | Freq: Two times a day (BID) | ORAL | Status: DC
  Administered 2022-02-21 – 2022-02-24 (×4): 1 via ORAL
  Filled 2022-02-20 (×7): qty 1

## 2022-02-20 MED ORDER — LIDOCAINE 2% (20 MG/ML) 5 ML SYRINGE
INTRAMUSCULAR | Status: AC
  Filled 2022-02-20: qty 5

## 2022-02-20 MED ORDER — PHENYLEPHRINE 40 MCG/ML (10ML) SYRINGE FOR IV PUSH (FOR BLOOD PRESSURE SUPPORT)
PREFILLED_SYRINGE | INTRAVENOUS | Status: DC | PRN
  Administered 2022-02-20 (×3): 80 ug via INTRAVENOUS

## 2022-02-20 MED ORDER — INSULIN ASPART 100 UNIT/ML IJ SOLN
0.0000 [IU] | Freq: Three times a day (TID) | INTRAMUSCULAR | Status: DC
  Administered 2022-02-21 – 2022-02-24 (×2): 1 [IU] via SUBCUTANEOUS

## 2022-02-20 MED ORDER — INSULIN ASPART 100 UNIT/ML IJ SOLN: 0.0000 [IU] | INTRAMUSCULAR | Status: DC | PRN

## 2022-02-20 MED ORDER — ATORVASTATIN CALCIUM 40 MG PO TABS
40.0000 mg | ORAL_TABLET | Freq: Every day | ORAL | Status: DC
  Administered 2022-02-20 – 2022-02-24 (×5): 40 mg via ORAL
  Filled 2022-02-20 (×7): qty 1

## 2022-02-20 MED ORDER — PROPOFOL 10 MG/ML IV BOLUS
INTRAVENOUS | Status: DC | PRN
  Administered 2022-02-20: 20 mg via INTRAVENOUS
  Administered 2022-02-20: 60 mg via INTRAVENOUS

## 2022-02-20 MED ORDER — METOCLOPRAMIDE HCL 5 MG PO TABS
5.0000 mg | ORAL_TABLET | Freq: Three times a day (TID) | ORAL | Status: DC
  Administered 2022-02-21 – 2022-02-24 (×8): 5 mg via ORAL
  Filled 2022-02-20 (×11): qty 1

## 2022-02-20 MED ORDER — LABETALOL HCL 5 MG/ML IV SOLN: 10.0000 mg | INTRAVENOUS | Status: DC | PRN

## 2022-02-20 MED ORDER — FERRIC CITRATE 1 GM 210 MG(FE) PO TABS
420.0000 mg | ORAL_TABLET | Freq: Three times a day (TID) | ORAL | Status: DC
  Administered 2022-02-21 – 2022-02-24 (×7): 420 mg via ORAL
  Filled 2022-02-20 (×20): qty 2

## 2022-02-20 MED ORDER — MAGNESIUM SULFATE 2 GM/50ML IV SOLN: 2.0000 g | Freq: Every day | INTRAVENOUS | Status: DC | PRN

## 2022-02-20 MED ORDER — PREGABALIN 50 MG PO CAPS
50.0000 mg | ORAL_CAPSULE | Freq: Three times a day (TID) | ORAL | Status: DC
  Administered 2022-02-20 – 2022-02-24 (×10): 50 mg via ORAL
  Filled 2022-02-20 (×13): qty 1

## 2022-02-20 MED ORDER — ACETAMINOPHEN 500 MG PO TABS
1000.0000 mg | ORAL_TABLET | Freq: Once | ORAL | Status: AC
  Administered 2022-02-20: 1000 mg via ORAL
  Filled 2022-02-20: qty 2

## 2022-02-20 MED ORDER — LIDOCAINE 2% (20 MG/ML) 5 ML SYRINGE
INTRAMUSCULAR | Status: DC | PRN
  Administered 2022-02-20: 40 mg via INTRAVENOUS

## 2022-02-20 MED ORDER — HYDROMORPHONE HCL 1 MG/ML IJ SOLN
INTRAMUSCULAR | Status: AC
  Filled 2022-02-20: qty 1

## 2022-02-20 MED ORDER — DEXAMETHASONE SODIUM PHOSPHATE 10 MG/ML IJ SOLN
INTRAMUSCULAR | Status: DC | PRN
  Administered 2022-02-20: 5 mg via INTRAVENOUS

## 2022-02-20 MED ORDER — ALBUTEROL SULFATE HFA 108 (90 BASE) MCG/ACT IN AERS: 2.0000 | INHALATION_SPRAY | Freq: Four times a day (QID) | RESPIRATORY_TRACT | Status: DC | PRN

## 2022-02-20 MED ORDER — CHLORHEXIDINE GLUCONATE 4 % EX LIQD: 60.0000 mL | Freq: Once | CUTANEOUS | Status: DC

## 2022-02-20 MED ORDER — METOPROLOL TARTRATE 5 MG/5ML IV SOLN: 2.0000 mg | INTRAVENOUS | Status: DC | PRN

## 2022-02-20 MED ORDER — DEXAMETHASONE SODIUM PHOSPHATE 10 MG/ML IJ SOLN
INTRAMUSCULAR | Status: AC
  Filled 2022-02-20: qty 1

## 2022-02-20 MED ORDER — SODIUM ZIRCONIUM CYCLOSILICATE 10 G PO PACK
10.0000 g | PACK | Freq: Two times a day (BID) | ORAL | Status: AC
  Administered 2022-02-20 (×2): 10 g via ORAL
  Filled 2022-02-20 (×2): qty 1

## 2022-02-20 MED ORDER — OXYCODONE HCL 5 MG PO TABS
10.0000 mg | ORAL_TABLET | ORAL | Status: DC | PRN
  Administered 2022-02-21: 10 mg via ORAL
  Administered 2022-02-21 – 2022-02-23 (×2): 15 mg via ORAL
  Filled 2022-02-20 (×2): qty 2
  Filled 2022-02-20 (×3): qty 3
  Filled 2022-02-20: qty 2
  Filled 2022-02-20: qty 3
  Filled 2022-02-20: qty 2

## 2022-02-20 MED ORDER — PHENYLEPHRINE 40 MCG/ML (10ML) SYRINGE FOR IV PUSH (FOR BLOOD PRESSURE SUPPORT)
PREFILLED_SYRINGE | INTRAVENOUS | Status: AC
  Filled 2022-02-20: qty 10

## 2022-02-20 MED ORDER — OXYCODONE HCL 5 MG PO TABS
5.0000 mg | ORAL_TABLET | ORAL | Status: DC | PRN
  Administered 2022-02-22 – 2022-02-26 (×3): 10 mg via ORAL
  Filled 2022-02-20 (×3): qty 2

## 2022-02-20 MED ORDER — ROCURONIUM BROMIDE 10 MG/ML (PF) SYRINGE
PREFILLED_SYRINGE | INTRAVENOUS | Status: DC | PRN
  Administered 2022-02-20: 50 mg via INTRAVENOUS

## 2022-02-20 MED ORDER — 0.9 % SODIUM CHLORIDE (POUR BTL) OPTIME
TOPICAL | Status: DC | PRN
  Administered 2022-02-20: 1000 mL

## 2022-02-20 MED ORDER — POLYETHYLENE GLYCOL 3350 17 G PO PACK
17.0000 g | PACK | Freq: Every day | ORAL | Status: DC | PRN
  Filled 2022-02-20: qty 1

## 2022-02-20 MED ORDER — LACTATED RINGERS IV SOLN: INTRAVENOUS | Status: DC

## 2022-02-20 MED ORDER — HYDROMORPHONE HCL 1 MG/ML IJ SOLN
0.2500 mg | INTRAMUSCULAR | Status: DC | PRN
  Administered 2022-02-20: 0.5 mg via INTRAVENOUS

## 2022-02-20 MED ORDER — DIPHENHYDRAMINE HCL 12.5 MG/5ML PO ELIX: 12.5000 mg | ORAL_SOLUTION | ORAL | Status: DC | PRN

## 2022-02-20 MED ORDER — ALBUTEROL SULFATE (2.5 MG/3ML) 0.083% IN NEBU
2.5000 mg | INHALATION_SOLUTION | Freq: Four times a day (QID) | RESPIRATORY_TRACT | Status: DC | PRN
  Administered 2022-02-26: 2.5 mg via RESPIRATORY_TRACT
  Filled 2022-02-20: qty 3

## 2022-02-20 MED ORDER — FENTANYL CITRATE (PF) 250 MCG/5ML IJ SOLN
INTRAMUSCULAR | Status: DC | PRN
  Administered 2022-02-20 (×2): 50 ug via INTRAVENOUS

## 2022-02-20 MED ORDER — ZINC SULFATE 220 (50 ZN) MG PO CAPS
220.0000 mg | ORAL_CAPSULE | Freq: Every day | ORAL | Status: DC
  Administered 2022-02-20 – 2022-02-24 (×5): 220 mg via ORAL
  Filled 2022-02-20 (×7): qty 1

## 2022-02-20 MED ORDER — POTASSIUM CHLORIDE CRYS ER 20 MEQ PO TBCR: 20.0000 meq | EXTENDED_RELEASE_TABLET | Freq: Every day | ORAL | Status: DC | PRN

## 2022-02-20 MED ORDER — ASCORBIC ACID 500 MG PO TABS
1000.0000 mg | ORAL_TABLET | Freq: Every day | ORAL | Status: DC
  Administered 2022-02-20 – 2022-02-24 (×5): 1000 mg via ORAL
  Filled 2022-02-20 (×7): qty 2

## 2022-02-20 MED ORDER — INSULIN ASPART 100 UNIT/ML IJ SOLN: 2.0000 [IU] | Freq: Three times a day (TID) | INTRAMUSCULAR | Status: DC

## 2022-02-20 MED ORDER — HYDROMORPHONE HCL 1 MG/ML IJ SOLN
0.5000 mg | INTRAMUSCULAR | Status: DC | PRN
  Administered 2022-02-20 – 2022-02-23 (×4): 1 mg via INTRAVENOUS
  Filled 2022-02-20 (×4): qty 1

## 2022-02-20 MED ORDER — PANTOPRAZOLE SODIUM 40 MG PO TBEC
40.0000 mg | DELAYED_RELEASE_TABLET | Freq: Every day | ORAL | Status: DC
  Administered 2022-02-20 – 2022-02-24 (×5): 40 mg via ORAL
  Filled 2022-02-20 (×7): qty 1

## 2022-02-20 MED ORDER — GUAIFENESIN-DM 100-10 MG/5ML PO SYRP: 15.0000 mL | ORAL_SOLUTION | ORAL | Status: DC | PRN

## 2022-02-20 MED ORDER — ONDANSETRON HCL 4 MG/2ML IJ SOLN: 4.0000 mg | Freq: Once | INTRAMUSCULAR | Status: DC | PRN

## 2022-02-20 MED ORDER — ONDANSETRON HCL 4 MG/2ML IJ SOLN: 4.0000 mg | Freq: Four times a day (QID) | INTRAMUSCULAR | Status: DC | PRN

## 2022-02-20 SURGICAL SUPPLY — 40 items
BAG COUNTER SPONGE SURGICOUNT (BAG) IMPLANT
BLADE SAW RECIP 87.9 MT (BLADE) ×2 IMPLANT
BLADE SURG 21 STRL SS (BLADE) ×2 IMPLANT
BNDG COHESIVE 6X5 TAN STRL LF (GAUZE/BANDAGES/DRESSINGS) ×2 IMPLANT
CANISTER WOUND CARE 500ML ATS (WOUND CARE) IMPLANT
CANISTER WOUNDNEG PRESSURE 500 (CANNISTER) ×2 IMPLANT
COVER SURGICAL LIGHT HANDLE (MISCELLANEOUS) ×2 IMPLANT
CUFF TOURN SGL QUICK 34 (TOURNIQUET CUFF)
CUFF TRNQT CYL 34X4.125X (TOURNIQUET CUFF) IMPLANT
DRAPE DERMATAC (DRAPES) ×4 IMPLANT
DRAPE INCISE IOBAN 66X45 STRL (DRAPES) ×4 IMPLANT
DRAPE U-SHAPE 47X51 STRL (DRAPES) ×2 IMPLANT
DRESSING PREVENA PLUS CUSTOM (GAUZE/BANDAGES/DRESSINGS) ×1 IMPLANT
DRSG PREVENA PLUS CUSTOM (GAUZE/BANDAGES/DRESSINGS) ×4
DURAPREP 26ML APPLICATOR (WOUND CARE) ×3 IMPLANT
ELECT REM PT RETURN 9FT ADLT (ELECTROSURGICAL) ×2
ELECTRODE REM PT RTRN 9FT ADLT (ELECTROSURGICAL) ×1 IMPLANT
GLOVE SURG ORTHO LTX SZ9 (GLOVE) ×2 IMPLANT
GLOVE SURG POLYISO LF SZ6.5 (GLOVE) ×2 IMPLANT
GLOVE SURG UNDER POLY LF SZ7.5 (GLOVE) ×2 IMPLANT
GLOVE SURG UNDER POLY LF SZ9 (GLOVE) ×2 IMPLANT
GOWN STRL REUS W/ TWL LRG LVL3 (GOWN DISPOSABLE) ×1 IMPLANT
GOWN STRL REUS W/ TWL XL LVL3 (GOWN DISPOSABLE) ×2 IMPLANT
GOWN STRL REUS W/TWL LRG LVL3 (GOWN DISPOSABLE) ×1
GOWN STRL REUS W/TWL XL LVL3 (GOWN DISPOSABLE) ×2
KIT BASIN OR (CUSTOM PROCEDURE TRAY) ×2 IMPLANT
KIT TURNOVER KIT B (KITS) ×2 IMPLANT
MANIFOLD NEPTUNE II (INSTRUMENTS) ×2 IMPLANT
NS IRRIG 1000ML POUR BTL (IV SOLUTION) ×2 IMPLANT
PACK ORTHO EXTREMITY (CUSTOM PROCEDURE TRAY) ×2 IMPLANT
PAD ARMBOARD 7.5X6 YLW CONV (MISCELLANEOUS) ×2 IMPLANT
PREVENA RESTOR ARTHOFORM 46X30 (CANNISTER) ×2 IMPLANT
STAPLER VISISTAT 35W (STAPLE) IMPLANT
STOCKINETTE IMPERVIOUS LG (DRAPES) IMPLANT
SUT ETHILON 2 0 PSLX (SUTURE) ×4 IMPLANT
SUT SILK 2 0 (SUTURE) ×1
SUT SILK 2-0 18XBRD TIE 12 (SUTURE) ×1 IMPLANT
TOWEL GREEN STERILE FF (TOWEL DISPOSABLE) ×2 IMPLANT
TUBE CONNECTING 20X1/4 (TUBING) ×2 IMPLANT
YANKAUER SUCT BULB TIP NO VENT (SUCTIONS) ×2 IMPLANT

## 2022-02-20 NOTE — Anesthesia Preprocedure Evaluation (Addendum)
Anesthesia Evaluation  ?Patient identified by MRN, date of birth, ID band ?Patient awake ? ? ? ?Reviewed: ?Allergy & Precautions, NPO status , Patient's Chart, lab work & pertinent test results ? ?Airway ?Mallampati: III ? ?TM Distance: >3 FB ?Neck ROM: Full ? ? ? Dental ?no notable dental hx. ?(+) Edentulous Upper, Edentulous Lower ?  ?Pulmonary ?shortness of breath, COPD,  COPD inhaler, Current Smoker and Patient abstained from smoking.,  ?<1ppd smoker currently ?  ?Pulmonary exam normal ?breath sounds clear to auscultation ? ? ? ? ? ? Cardiovascular ?hypertension (poorly controlled HTN 194/54 in preop, per pt this is normal for him), Pt. on medications ?+ Peripheral Vascular Disease  ?DVT: 192/92.  ?Normal cardiovascular exam ?Rhythm:Regular Rate:Normal ? ? ?  ?Neuro/Psych ?PSYCHIATRIC DISORDERS Depression Dementia negative neurological ROS ?   ? GI/Hepatic ?negative GI ROS, (+)  ?  ? substance abuse ? marijuana use, Daily marijuana ? ?  ?Endo/Other  ?diabetes, Poorly Controlled, Type 2, Oral Hypoglycemic AgentsFS 121 ? Renal/GU ?ESRF and DialysisRenal disease (HD T/Th/Sat)  ?negative genitourinary ?  ?Musculoskeletal ?negative musculoskeletal ROS ?(+)  ? Abdominal ?  ?Peds ?negative pediatric ROS ?(+)  Hematology ?negative hematology ROS ?(+)   ?Anesthesia Other Findings ?Diabetic peripheral neuropathy and retinopathy, legally blind ? Reproductive/Obstetrics ?negative OB ROS ? ?  ? ? ? ? ? ? ? ? ? ? ? ? ? ?  ?  ? ? ? ? ? ? ? ?Anesthesia Physical ?Anesthesia Plan ? ?ASA: 3 ? ?Anesthesia Plan: General  ? ?Post-op Pain Management: Tylenol PO (pre-op)*, Ketamine IV* and Dilaudid IV  ? ?Induction: Intravenous ? ?PONV Risk Score and Plan: 1 and Ondansetron, Dexamethasone, Midazolam and Treatment may vary due to age or medical condition ? ?Airway Management Planned: Oral ETT ? ?Additional Equipment: None ? ?Intra-op Plan:  ? ?Post-operative Plan: Extubation in OR ? ?Informed Consent: I  have reviewed the patients History and Physical, chart, labs and discussed the procedure including the risks, benefits and alternatives for the proposed anesthesia with the patient or authorized representative who has indicated his/her understanding and acceptance.  ? ? ? ?Dental advisory given ? ?Plan Discussed with: CRNA ? ?Anesthesia Plan Comments: (HD yesterday, K initially 6 on istat- 5.2 on redraw istat and 5.3 on formal CMP)  ? ? ? ? ? ?Anesthesia Quick Evaluation ? ?

## 2022-02-20 NOTE — H&P (Signed)
Garrett Salas is an 71 y.o. male.   ?Chief Complaint: Gangrenous pain bilateral lower extremities ?HPI: Patient is a 71 year old gentleman who presents complaining of a large necrotic ulcer and exposed patella on the left knee.  Patient has bilateral transtibial amputation and has painful ischemia to the right transtibial amputation. ? ?Patient undergoes dialysis Tuesday Thursday Saturday . ? ?Patient has COPD he is currently a smoker he is diabetic with peripheral artery disease and end-stage renal disease on dialysis.  Patient currently cannot wear his prosthesis secondary to pain.  He states this has been going on for months ? ?Past Medical History:  ?Diagnosis Date  ? Complication of anesthesia 2016  ? Mrs. Brannigan reporte dthat patient had too much medication and had to be given Narcan. Mr. Wichmann was takingh pain medication also.  ? COPD (chronic obstructive pulmonary disease) ([place])   ? Dementia ([place])   ? 02/19/22 Mrs. Gladhill said patient has some dementia.  ? Depression   ? Diabetes mellitus   ? Type 2. Diagnosed in mid 34s. Currently insulin requiring  ? Diabetic neuropathy ([place])   ? Diabetic retinopathy   ? legally blind  ? Dialysis patient [facility])   ? Dyspnea   ? ESRD (end stage renal disease) on dialysis [facility])   ? due to diabetic nephropathy. T/Th/Sat  ? Gangrene ([place])   ? right index  ? History of blood transfusion   ? Hx of right BKA ([place])   ? Hyperlipidemia   ? Hypertension   ? Peripheral vascular disease ([place])   ? Substance abuse ([place])   ? Tobacco abuse   ? Wears dentures   ? ? ?Past Surgical History:  ?Procedure Laterality Date  ? A/V FISTULAGRAM N/A 05/22/2019  ? Procedure: A/V FISTULAGRAM - Right Arm;  Surgeon: Waynetta [place], MD;  Location: [place] CV LAB;  Service: Cardiovascular;  Laterality: N/A;  ? ABDOMINAL AORTAGRAM N/A 01/07/2015  ? Procedure: ABDOMINAL AORTAGRAM;  Surgeon: Conrad Cass, MD;  Location: [facility] CATH LAB;  Service: Cardiovascular;  Laterality: N/A;  ? AMPUTATION Right  01/09/2015  ? Procedure: AMPUTATION BELOW KNEE RIGHT LEG;  Surgeon: Conrad [place], MD;  Location: [place];  Service: Vascular;  Laterality: Right;  ? AMPUTATION Left 05/16/2015  ? Procedure: LEFT BELOW THE KNEE AMPUTATION;  Surgeon: Conrad Rattan, MD;  Location: [place];  Service: Vascular;  Laterality: Left;  ? AMPUTATION Right 06/12/2019  ? Procedure: AMPUTATION RAY RIGHT INDEX FINGER;  Surgeon: Leanora Cover, MD;  Location: [place];  Service: Orthopedics;  Laterality: Right;  ? AMPUTATION Right 06/26/2019  ? Procedure: RIGHT SMALL FINGER AMPUTATION;  Surgeon: Leanora Cover, MD;  Location: [place];  Service: Orthopedics;  Laterality: Right;  ? AV FISTULA PLACEMENT    ? CARDIAC CATHETERIZATION  05/06/2011  ? no significant CAD, EF 55-60%, LVEDP :15   ? INSERTION OF DIALYSIS CATHETER N/A 05/24/2019  ? Procedure: INSERTION OF DIALYSIS CATHETER;  Surgeon: Waynetta [place], MD;  Location: [place];  Service: Vascular;  Laterality: N/A;  ? LIGATION OF ARTERIOVENOUS  FISTULA Right 05/24/2019  ? Procedure: LIGATION OF ARTERIOVENOUS  FISTULA RIGHT ARM;  Surgeon: Waynetta [place], MD;  Location: [place];  Service: Vascular;  Laterality: Right;  ? MULTIPLE TOOTH EXTRACTIONS    ? none    ? UPPER EXTREMITY ANGIOGRAPHY Right 05/22/2019  ? Procedure: Upper Extremity Angiography;  Surgeon: Waynetta [place], MD;  Location: [place] CV LAB;  Service: Cardiovascular;  Laterality: Right;  ?  UPPER EXTREMITY VENOGRAPHY Left 05/22/2019  ? Procedure: UPPER EXTREMITY VENOGRAPHY;  Surgeon: Waynetta [place], MD;  Location: [place] CV LAB;  Service: Cardiovascular;  Laterality: Left;  ? ? ?Family History  ?Problem Relation Age of Onset  ? Diabetes Mother   ? Heart disease Other   ? Alcohol abuse Other   ? Diabetes Sister   ? Diabetes Brother   ? ?Social History:  reports that he has been smoking cigarettes. He has been smoking an average of .5 packs per day. He has never used smokeless tobacco. He reports current drug use.  Drug: Marijuana. He reports that he does not drink alcohol. ? ?Allergies:  ?Allergies  ?Allergen Reactions  ? Lisinopril Anaphylaxis and Rash  ?  Other reaction(s): Breathing Problems ?Angioedema ?  ? Aspirin Other (See Comments)  ?  Bleeding from dialysis fistula   ? ? ?Medications Prior to Admission  ?Medication Sig Dispense Refill  ? ACCU-CHEK AVIVA PLUS test strip     ? ACCU-CHEK SOFTCLIX LANCETS lancets     ? albuterol (PROVENTIL HFA;VENTOLIN HFA) 108 (90 BASE) MCG/ACT inhaler Inhale 2 puffs into the lungs every 6 (six) hours as needed for wheezing or shortness of breath.    ? amLODipine (NORVASC) 10 MG tablet Take 1 tablet (10 mg total) by mouth daily. 30 tablet 2  ? atorvastatin (LIPITOR) 40 MG tablet Take 40 mg by mouth daily.    ? AURYXIA 1 GM 210 MG(Fe) tablet Take 420 mg by mouth 3 (three) times daily with meals.     ? B-D UF III MINI PEN NEEDLES 31G X 5 MM MISC     ? Blood Glucose Monitoring Suppl (ACCU-CHEK AVIVA PLUS) W/DEVICE KIT     ? calcitRIOL (ROCALTROL) 0.5 MCG capsule Take 1 capsule (0.5 mcg total) by mouth Every Tuesday,Thursday,and Saturday with dialysis. 30 capsule   ? calcium carbonate (TUMS - DOSED IN MG ELEMENTAL CALCIUM) 500 MG chewable tablet Chew 1-2 tablets by mouth 3 (three) times daily as needed for indigestion or heartburn.    ? diphenhydrAMINE (BENADRYL) 25 MG tablet Take 25 mg by mouth as needed for itching.    ? metoCLOPramide (REGLAN) 5 MG tablet Take 5 mg by mouth 3 (three) times daily before meals.  (Patient not taking: Reported on 01/24/2022)    ? Multiple Vitamin (DAILY VITE PO) Take 1 tablet by mouth Every Tuesday,Thursday,and Saturday with dialysis.    ? pregabalin (LYRICA) 50 MG capsule Take 1 capsule (50 mg total) by mouth 3 (three) times daily. Take 1 capsule by mouth scheduled in the morning & up to twice daily as needed for pain. 90 capsule 1  ? sitaGLIPtin (JANUVIA) 25 MG tablet Take 25 mg by mouth daily.    ? ULTICARE INSULIN SYRINGE 29G X 1/2" 0.5 ML MISC      ? ? ?No results found for this or any previous visit (from the past 48 hour(s)). ?No results found. ? ?Review of Systems  ?All other systems reviewed and are negative. ? ?There were no vitals taken for this visit. ?Physical Exam  ?Patient is alert, oriented, no adenopathy, well-dressed, normal affect, normal respiratory effort. ?Examination patient's bilateral transtibial amputations are cold to the touch.  Patient has necrotic patella on the left with a large necrotic ulcer over the medial aspect of the left knee.  Light touch of the right transtibial amputation is painful and there are small ulcers with ischemic changes. ?Assessment/Plan ?1. Atherosclerosis of native arteries of extremities with gangrene,  left leg ([place])   ?2. Atherosclerosis of native arteries of extremities with gangrene, right leg ([facility])   ?  ?  ?Plan: Discussed that with the necrotic ulcer over the patella on the left his only option is a above-the-knee amputation on the left.  Discussed that with the ischemic pain of the right transtibial amputation his best treatment option would be a above-the-knee amputation on the right as well.  We will plan for bilateral above-the-knee amputations.  We will plan for surgery next Friday. ? ?Newt Minion, MD ?03/06/2022, 6:39 AM ? ? ? ?

## 2022-02-20 NOTE — Op Note (Signed)
03/07/2022 ? ?10:36 AM ? ?PATIENT:  Garrett Salas   ? ?PRE-OPERATIVE DIAGNOSIS:  Gangrene Left Below Knee Amptuation, Ischemic Right Below Knee Amputation ? ?POST-OPERATIVE DIAGNOSIS:  Same ? ?PROCEDURE:  BILATERAL ABOVE KNEE AMPUATION ? ?SURGEON:  Newt Minion, MD ? ?PHYSICIAN ASSISTANT:None ?ANESTHESIA:   General ? ?PREOPERATIVE INDICATIONS:  [name] is a  71 y.o. male with a diagnosis of Gangrene Left Below Knee Amptuation, Ischemic Right Below Knee Amputation who failed conservative measures and elected for surgical management.   ? ?The risks benefits and alternatives were discussed with the patient preoperatively including but not limited to the risks of infection, bleeding, nerve injury, cardiopulmonary complications, the need for revision surgery, among others, and the patient was willing to proceed. ? ?OPERATIVE IMPLANTS: none ? ?'@ENCIMAGES'$ @ ? ?OPERATIVE FINDINGS: Completely occluded femoral arteries bilaterally.  Good petechial bleeding in the muscles. ? ?OPERATIVE PROCEDURE: Patient was brought the operating room and underwent general anesthetic.  After adequate levels anesthesia were obtained patient's both lower extremities were prepped using DuraPrep draped into a sterile field.  A timeout was called.  A fishmouth incision was made proximal to the patella on the left.  This was carried sharply down to bone.  Dissection was carried down to the femoral vessels these were clamped and suture-ligated with 2-0 silk.  The vessel was completely calcified.  The amputation was completed a reciprocating saw was used to perform the amputation through the femur.  Electrocautery was used for hemostasis.  The deep and superficial fascial layers were closed using #1 Vicryl skin was closed using staples.  Attention was then focused on the right lower extremity.  A fishmouth incision was made just proximal to the patella and this was carried sharply down to bone.  The vascular bundle medially was clamped and  suture-ligated with 2-0 silk.  The femoral artery is completely calcified.  The amputation was completed the bone was amputated with a reciprocating saw.  Electrocautery was used for hemostasis.  The deep and superficial fascial layers were closed using #1 Vicryl skin was closed using staples.  The customizable Prevena wound VAC was applied circumferentially to both lower extremities.  This was outlined with Ioban and derma tack this had a good suction fit patient was extubated taken the PACU in stable condition. ? ? ?DISCHARGE PLANNING: ? ?Antibiotic duration: 24 hours ? ?Weightbearing: Not applicable ? ?Pain medication: Opioid pathway ? ?Dressing care/ Wound VAC: Continue wound VAC for 1 week ? ?Ambulatory devices: Not applicable ? ?Discharge to: Skilled nursing facility. ? ?Follow-up: In the office 1 week post operative. ? ? ? ? ? ? ?  ?

## 2022-02-20 NOTE — Progress Notes (Signed)
Unable to complete admission checklist list due to pt alert status. Pt only responds to voice. Pt is still in bed sleeping. Bed in lowest position. Call bell within reach.  ?

## 2022-02-20 NOTE — Progress Notes (Signed)
Contacted by pt's out-pt HD clinic regarding pt needing snf at d/c. Pt's wife feels she can no longer meet pt's needs at home. Pt receives out-pt HD at [facility]. Pt was TTS prior to admission but per clinic most [place] snf require a MWF schedule. Clinic can change pt to MWF schedule and time will be decided once snf is determined and see what they can accommodate. Will assist as needed.  ? ?Melven Sartorius ?Renal Navigator ?[phone] ?

## 2022-02-20 NOTE — Anesthesia Procedure Notes (Signed)
Procedure Name: Intubation ?Date/Time: 03/04/2022 9:37 AM ?Performed by: Dorann Lodge, CRNA ?Pre-anesthesia Checklist: Patient identified, Emergency Drugs available, Suction available and Patient being monitored ?Patient Re-evaluated:Patient Re-evaluated prior to induction ?Oxygen Delivery Method: Circle System Utilized ?Preoxygenation: Pre-oxygenation with 100% oxygen ?Induction Type: IV induction ?Ventilation: Mask ventilation without difficulty and Oral airway inserted - appropriate to patient size ?Laryngoscope Size: Mac and 4 ?Grade View: Grade I ?Tube type: Oral ?Tube size: 7.5 mm ?Number of attempts: 1 ?Airway Equipment and Method: Stylet and Oral airway ?Placement Confirmation: ETT inserted through vocal cords under direct vision, positive ETCO2 and breath sounds checked- equal and bilateral ?Secured at: 23 cm ?Tube secured with: Tape ?Dental Injury: Teeth and Oropharynx as per pre-operative assessment  ? ? ? ? ?

## 2022-02-20 NOTE — Transfer of Care (Signed)
Immediate Anesthesia Transfer of Care Note ? ?Patient: Garrett Salas [facility] ? ?Procedure(s) Performed: BILATERAL ABOVE KNEE AMPUATION (Bilateral: Knee) ? ?Patient Location: PACU ? ?Anesthesia Type:General ? ?Level of Consciousness: drowsy ? ?Airway & Oxygen Therapy: Patient Spontanous Breathing and Patient connected to nasal cannula oxygen ? ?Post-op Assessment: Report given to RN and Post -op Vital signs reviewed and stable ? ?Post vital signs: Reviewed and stable ? ?Last Vitals:  ?Vitals Value Taken Time  ?BP    ?Temp    ?Pulse    ?Resp    ?SpO2    ? ? ?Last Pain:  ?Vitals:  ? 02/27/2022 0708  ?TempSrc:   ?PainSc: 6   ?   ? ?Patients Stated Pain Goal: 3 (03/12/2022 0708) ? ?Complications: No notable events documented. ?

## 2022-02-20 NOTE — Consult Note (Signed)
Renal Service ?Consult Note ?Garrett Salas ? ?[name] ?03/07/2022 ?Sol Blazing, MD ?Requesting Physician: Dr. Sharol Given ? ?Reason for Consult: ESRD pt sp orthopedic surgery today ?HPI: The patient is a 71 y.o. year-old w/ hx of COPD, dementia, DM2, ESRD on HD, bilat BKA, HL, HTN who is being admitted after surgery this am which was bilat BKA > AKA revision by Dr Sharol Given. Asked to see ESRD.  ? ?Pt seen in PACU.  A bit groggy. Lives at home. No CP or abd pain at this time, no SOB , cough. No fevers. No recent dialysis issues. ? ? ?ROS - denies CP, no joint pain, no HA, no blurry vision, no rash, no diarrhea, no nausea/ vomiting ? ? ?Past Medical History  ?Past Medical History:  ?Diagnosis Date  ? Complication of anesthesia 2016  ? Mrs. Beaumier reporte dthat patient had too much medication and had to be given Narcan. Mr. Sebesta was takingh pain medication also.  ? COPD (chronic obstructive pulmonary disease) ([place])   ? Dementia ([place])   ? 02/19/22 Mrs. Giannattasio said patient has some dementia.  ? Depression   ? Diabetes mellitus   ? Type 2. Diagnosed in mid 38s. Currently insulin requiring  ? Diabetic neuropathy ([place])   ? Diabetic retinopathy   ? legally blind  ? Dialysis patient [facility])   ? Dyspnea   ? ESRD (end stage renal disease) on dialysis [facility])   ? due to diabetic nephropathy. T/Th/Sat  ? Gangrene ([place])   ? right index  ? History of blood transfusion   ? Hx of right BKA ([place])   ? Hyperlipidemia   ? Hypertension   ? Peripheral vascular disease ([place])   ? Substance abuse ([place])   ? Tobacco abuse   ? Wears dentures   ? ?Past Surgical History  ?Past Surgical History:  ?Procedure Laterality Date  ? A/V FISTULAGRAM N/A 05/22/2019  ? Procedure: A/V FISTULAGRAM - Right Arm;  Surgeon: Waynetta [place], MD;  Location: [place] CV LAB;  Service: Cardiovascular;  Laterality: N/A;  ? ABDOMINAL AORTAGRAM N/A 01/07/2015  ? Procedure: ABDOMINAL AORTAGRAM;  Surgeon: Conrad [place], MD;  Location: [facility] CATH LAB;   Service: Cardiovascular;  Laterality: N/A;  ? AMPUTATION Right 01/09/2015  ? Procedure: AMPUTATION BELOW KNEE RIGHT LEG;  Surgeon: Conrad [place], MD;  Location: [place];  Service: Vascular;  Laterality: Right;  ? AMPUTATION Left 05/16/2015  ? Procedure: LEFT BELOW THE KNEE AMPUTATION;  Surgeon: Conrad Britt, MD;  Location: [place];  Service: Vascular;  Laterality: Left;  ? AMPUTATION Right 06/12/2019  ? Procedure: AMPUTATION RAY RIGHT INDEX FINGER;  Surgeon: Leanora Cover, MD;  Location: [place];  Service: Orthopedics;  Laterality: Right;  ? AMPUTATION Right 06/26/2019  ? Procedure: RIGHT SMALL FINGER AMPUTATION;  Surgeon: Leanora Cover, MD;  Location: [place];  Service: Orthopedics;  Laterality: Right;  ? AV FISTULA PLACEMENT    ? CARDIAC CATHETERIZATION  05/06/2011  ? no significant CAD, EF 55-60%, LVEDP :15   ? INSERTION OF DIALYSIS CATHETER N/A 05/24/2019  ? Procedure: INSERTION OF DIALYSIS CATHETER;  Surgeon: Waynetta [place], MD;  Location: [place];  Service: Vascular;  Laterality: N/A;  ? LIGATION OF ARTERIOVENOUS  FISTULA Right 05/24/2019  ? Procedure: LIGATION OF ARTERIOVENOUS  FISTULA RIGHT ARM;  Surgeon: Waynetta [place], MD;  Location: [place];  Service: Vascular;  Laterality: Right;  ? MULTIPLE TOOTH EXTRACTIONS    ? none    ? UPPER  EXTREMITY ANGIOGRAPHY Right 05/22/2019  ? Procedure: Upper Extremity Angiography;  Surgeon: Waynetta [place], MD;  Location: [place] CV LAB;  Service: Cardiovascular;  Laterality: Right;  ? UPPER EXTREMITY VENOGRAPHY Left 05/22/2019  ? Procedure: UPPER EXTREMITY VENOGRAPHY;  Surgeon: Waynetta [place], MD;  Location: [place] CV LAB;  Service: Cardiovascular;  Laterality: Left;  ? ?Family History  ?Family History  ?Problem Relation Age of Onset  ? Diabetes Mother   ? Heart disease Other   ? Alcohol abuse Other   ? Diabetes Sister   ? Diabetes Brother   ? ?Social History  reports that he has been smoking cigarettes. He has been smoking an average of .5 packs  per day. He has never used smokeless tobacco. He reports current drug use. Drug: Marijuana. He reports that he does not drink alcohol. ?Allergies  ?Allergies  ?Allergen Reactions  ? Lisinopril Anaphylaxis and Rash  ?  Other reaction(s): Breathing Problems ?Angioedema ?  ? Aspirin Other (See Comments)  ?  Bleeding from dialysis fistula   ? ?Home medications ?Prior to Admission medications   ?Medication Sig Start Date End Date Taking? Authorizing Provider  ?albuterol (PROVENTIL HFA;VENTOLIN HFA) 108 (90 BASE) MCG/ACT inhaler Inhale 2 puffs into the lungs every 6 (six) hours as needed for wheezing or shortness of breath.   Yes [provider]  ?amLODipine (NORVASC) 10 MG tablet Take 1 tablet (10 mg total) by mouth daily. 01/28/22  Yes Farrel Gordon, DO  ?atorvastatin (LIPITOR) 40 MG tablet Take 40 mg by mouth daily.   Yes [provider]  ?AURYXIA 1 GM 210 MG(Fe) tablet Take 420 mg by mouth 3 (three) times daily with meals.  02/15/19  Yes [provider]  ?calcitRIOL (ROCALTROL) 0.5 MCG capsule Take 1 capsule (0.5 mcg total) by mouth Every Tuesday,Thursday,and Saturday with dialysis. 05/24/15  Yes Nita Sells, MD  ?diphenhydrAMINE (BENADRYL) 25 MG tablet Take 25 mg by mouth as needed for itching.   Yes [provider]  ?metoCLOPramide (REGLAN) 5 MG tablet Take 5 mg by mouth 3 (three) times daily before meals. 03/18/18  Yes [provider]  ?Multiple Vitamin (DAILY VITE PO) Take 1 tablet by mouth Every Tuesday,Thursday,and Saturday with dialysis.   Yes [provider]  ?pregabalin (LYRICA) 50 MG capsule Take 1 capsule (50 mg total) by mouth 3 (three) times daily. Take 1 capsule by mouth scheduled in the morning & up to twice daily as needed for pain. 01/26/22 01/26/23 Yes Farrel Gordon, DO  ?sitaGLIPtin (JANUVIA) 25 MG tablet Take 25 mg by mouth daily.   Yes [provider]  ?ACCU-CHEK AVIVA PLUS test strip  01/17/15   [provider]  ?Ermalene Postin  LANCETS lancets  01/16/15   [provider]  ?B-D UF III MINI PEN NEEDLES 31G X 5 MM MISC  01/21/15   [provider]  ?Blood Glucose Monitoring Suppl (ACCU-CHEK AVIVA PLUS) W/DEVICE KIT  01/16/15   [provider]  ?calcium carbonate (TUMS - DOSED IN MG ELEMENTAL CALCIUM) 500 MG chewable tablet Chew 1-2 tablets by mouth 3 (three) times daily as needed for indigestion or heartburn.    [provider]  ?Flossie Buffy INSULIN SYRINGE 29G X 1/2" 0.5 ML MISC  04/08/15   [provider]  ? ? ? ?Vitals:  ? 02/24/2022 1220 02/18/2022 1235 03/10/2022 1250 02/21/2022 1305  ?BP: (!) 179/57 (!) 179/63 (!) 188/55 (!) 176/54  ?Pulse: 70 70 70   ?Resp: _0 (!) 8  ?  Temp:      ?TempSrc:      ?SpO2: 100% 100% 100% 100%  ?Weight:      ?Height:      ? ?Exam ?Gen alert, chronically ill appearing adult ?No rash, cyanosis or gangrene ?Sclera anicteric, throat clear  ?No jvd or bruits ?Chest clear bilat to bases, no rales/ wheezing ?RRR no RG ?Abd soft ntnd no mass or ascites +bs ?GU normal male ?MS no joint effusions or deformity ?Ext bilat AKA w/ post op dressings in place ?Neuro is alert, knows year and place ?   L IJ TDC ? ? Home meds include - norvasc 10, auryxia 2 ac, reglan 5 tid, MVI, lyrica 50 tid, januvia, prns/ vits/ supps ? ? OP HD: TTS [facility] ? 3.5h   400/500  43.3kg  2K/3Ca bath  P2  LI[facility] TDC ? - rocaltrol 0.25 ug tiw ? - no esa/ Fe ? - last Hb 11.4 on 4/06 ? - last HD 4/06 yest ? ? ? 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 Last HD yest. HD tomorrow.  ?HTN/ vol - BP's up post op, no vol ^ on exam. UFG 2 L was HD tomorrow.  ?Hyperkalemia - give lokelma 10 gm x 2 today, HD tomorrow. Renal diet when eating.  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  ?MBD ckd - Ca in range, add phos/ alb, cont binder, vdra.  ?Dementia ?COPD ?DM2 - not on insulin ?  ? ? ? ?Kelly Splinter  MD ?02/18/2022, 1:23 PM ?Recent Labs  ?Lab 02/24/2022 ?0709 02/19/2022 ?0734  02/21/2022 ?0740  ?HGB 13.6 13.3  --   ?CALCIUM  --   --  8.9  ?CREATININE 8.30* 8.20* 7.53*  ?K 6.0* 5.2* 5.3*  ? ? ?

## 2022-02-20 NOTE — Anesthesia Postprocedure Evaluation (Signed)
Anesthesia Post Note ? ?Patient: Garrett Salas [facility] ? ?Procedure(s) Performed: BILATERAL ABOVE KNEE AMPUATION (Bilateral: Knee) ? ?  ? ?Patient location during evaluation: PACU ?Anesthesia Type: General ?Level of consciousness: awake and alert, oriented and patient cooperative ?Pain management: pain level controlled ?Vital Signs Assessment: post-procedure vital signs reviewed and stable ?Respiratory status: spontaneous breathing, nonlabored ventilation and respiratory function stable ?Cardiovascular status: blood pressure returned to baseline and stable ?Postop Assessment: no apparent nausea or vomiting ?Anesthetic complications: no ? ? ?No notable events documented. ? ?Last Vitals:  ?Vitals:  ? 03/12/2022 1049 02/25/2022 1105  ?BP: (!) 158/57   ?Pulse:    ?Resp: (!) 9   ?Temp:    ?SpO2: 100% 100%  ?  ?Last Pain:  ?Vitals:  ? 02/25/2022 1105  ?TempSrc:   ?PainSc: Asleep  ? ? ?  ?  ?  ?  ?  ?  ? ?Garrett Salas ? ? ? ? ?

## 2022-02-21 ENCOUNTER — Encounter (HOSPITAL_COMMUNITY): Payer: Self-pay | Admitting: Orthopedic Surgery

## 2022-02-21 LAB — BASIC METABOLIC PANEL
Anion gap: 14 (ref 5–15)
BUN: 54 mg/dL — ABNORMAL HIGH (ref 8–23)
CO2: 18 mmol/L — ABNORMAL LOW (ref 22–32)
Calcium: 8.1 mg/dL — ABNORMAL LOW (ref 8.9–10.3)
Chloride: 103 mmol/L (ref 98–111)
Creatinine, Ser: 8.37 mg/dL — ABNORMAL HIGH (ref 0.61–1.24)
GFR, Estimated: 6 mL/min — ABNORMAL LOW (ref >60)
Glucose, Bld: 139 mg/dL — ABNORMAL HIGH (ref 70–99)
Potassium: 6.1 mmol/L — ABNORMAL HIGH (ref 3.5–5.1)
Sodium: 135 mmol/L (ref 135–145)

## 2022-02-21 LAB — CBC
HCT: 35.1 % — ABNORMAL LOW (ref 39.0–52.0)
Hemoglobin: 11.3 g/dL — ABNORMAL LOW (ref 13.0–17.0)
MCH: 29.9 pg (ref 26.0–34.0)
MCHC: 32.2 g/dL (ref 30.0–36.0)
MCV: 92.9 fL (ref 80.0–100.0)
Platelets: 172 10*3/uL (ref 150–400)
RBC: 3.78 MIL/uL — ABNORMAL LOW (ref 4.22–5.81)
RDW: 16.1 % — ABNORMAL HIGH (ref 11.5–15.5)
WBC: 7.5 10*3/uL (ref 4.0–10.5)
nRBC: 0 % (ref 0.0–0.2)

## 2022-02-21 LAB — GLUCOSE, CAPILLARY
Glucose-Capillary: 113 mg/dL — ABNORMAL HIGH (ref 70–99)
Glucose-Capillary: 130 mg/dL — ABNORMAL HIGH (ref 70–99)
Glucose-Capillary: 141 mg/dL — ABNORMAL HIGH (ref 70–99)
Glucose-Capillary: 156 mg/dL — ABNORMAL HIGH (ref 70–99)
Glucose-Capillary: 155 mg/dL — ABNORMAL HIGH (ref 70–99)

## 2022-02-21 MED ORDER — HEPARIN SODIUM (PORCINE) 1000 UNIT/ML IJ SOLN
INTRAMUSCULAR | Status: AC
  Filled 2022-02-21: qty 4

## 2022-02-21 NOTE — Progress Notes (Signed)
Patient ID: Garrett Salas, male   DOB: 1951-04-13, 72 y.o.   MRN: [id number] ?Patient is postoperative day 1 bilateral above-the-knee amputations.  He states he has no leg pain at this time.  There is no drainage in the wound VAC canisters.  Plan for discharge to skilled nursing.  Patient in dialysis this morning, eating breakfast comfortably. ?

## 2022-02-21 NOTE — Progress Notes (Signed)
OT Cancellation Note ? ?Patient Details ?Name: Garrett Salas [facility] ?MRN: [id number] ?DOB: [birth year] ? ? ?Cancelled Treatment:    Reason Eval/Treat Not Completed: Patient at procedure or test/ unavailable (attempted to see patient at 0915 and 1106 but patient out of room.  Will re-attempt when able) ? ?Hadley Pen ?02/21/2022, 1:01 PM ?

## 2022-02-21 NOTE — Progress Notes (Signed)
PT Cancellation Note ? ?Patient Details ?Name: Garrett Salas [facility] ?MRN: [id number] ?DOB: Jan 18, 1951 ? ? ?Cancelled Treatment:    Reason Eval/Treat Not Completed: Patient at procedure or test/unavailable. Pt currently in HD. Will check back as schedule allows to initiate PT evaluation.  ? ? ?Thelma Comp ?02/21/2022, 12:19 PM ? ?Rolinda Roan, PT, DPT ?Acute Rehabilitation Services ?Secure Chat Preferred ?Office: [phone] ? ? ? ?

## 2022-02-21 NOTE — Progress Notes (Signed)
[facility] ?Progress Note ? ?Subjective: seen in room. Post op pain only c/o ? ?Vitals:  ? 02/24/2022 1735 03/04/2022 1803 02/17/2022 1949 02/21/22 0405  ?BP: (!) 182/96 (!) 147/48 (!) 139/56 (!) 149/56  ?Pulse:  74 77 71  ?Resp:   18 16  ?Temp:   98.3 ?F (36.8 ?C) 97.6 ?F (36.4 ?C)  ?TempSrc:   Oral Oral  ?SpO2:  100% 100% 100%  ?Weight:      ?Height:      ? ? ?Exam: ?Gen alert, chronically ill appearing adult ?No jvd or bruits ?Chest clear bilat to bases ?RRR no RG ?Abd soft ntnd no mass or ascites +bs ?Ext bilat AKA, dressings in place ?Neuro is alert and responsive ?   L IJ TDC ?  ? Home meds include - norvasc 10, auryxia 2 ac, reglan 5 tid, MVI, lyrica 50 tid, januvia, prns/ vits/ supps ?  ? OP HD: [facility] ? 3.5h   400/500  43.3kg  2K/3Ca bath  P2  LIJ TDC ? - rocaltrol 0.25 ug tiw ? - no esa/ Fe ? - last Hb 11.4 on 4/06 ? - last HD 4/06 yest ?  ?  ?  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 Last HD was OP 4/06. Next HD today.  ?HTN/ vol - BP's up , no vol ^ on exam. UFG 2-3 L w/ HD today.  ?Hyperkalemia - K+ 6.1 today. Getting renal diet. Low K+ bath today on HD.   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  ?MBD ckd - CCa in range, phos slightly ^. Cont binder, vdra.  ?Dementia ?COPD ?DM2 - not on insulin ?DNR ?  ?  ? ? ? ?Rob Doctor, hospital ?02/21/2022, 5:13 AM ? ? ?Recent Labs  ?Lab 02/14/2022 ?0174 02/18/2022 ?0738 02/23/2022 ?0740 02/21/22 ?0145 02/21/22 ?9449  ?HGB 13.3  --   --  11.3*  --   ?ALBUMIN  --  3.3*  --   --   --   ?CALCIUM  --   --  8.9  --  8.1*  ?PHOS  --  6.4*  --   --   --   ?CREATININE 8.20*  --  7.53*  --  8.37*  ?K 5.2*  --  5.3*  --  6.1*  ? ?Inpatient medications: ? amLODipine  10 mg Oral Daily  ? vitamin C  1,000 mg Oral Daily  ? atorvastatin  40 mg Oral Daily  ? calcitRIOL  0.25 mcg Oral Q T,Th,Sa-HD  ? Chlorhexidine Gluconate Cloth  6 each Topical Q0600  ? docusate sodium  100 mg Oral Daily  ? ferric citrate  420 mg Oral TID WC  ? insulin aspart   0-6 Units Subcutaneous TID WC  ? insulin aspart  2 Units Subcutaneous TID WC  ? metoCLOPramide  5 mg Oral TID AC  ? nutrition supplement (JUVEN)  1 packet Oral BID BM  ? pantoprazole  40 mg Oral Daily  ? pregabalin  50 mg Oral TID  ? zinc sulfate  220 mg Oral Daily  ? ? sodium chloride 10 mL/hr at 03/11/2022 0716  ? sodium chloride 75 mL/hr at 03/05/2022 1749  ?  ceFAZolin (ANCEF) IV 1 g (03/14/2022 1753)  ? magnesium sulfate bolus IVPB    ? ?acetaminophen, albuterol, alum & mag hydroxide-simeth, bisacodyl, diphenhydrAMINE, guaiFENesin-dextromethorphan, hydrALAZINE, HYDROmorphone (DILAUDID) injection, oxyCODONE **OR** HYDROmorphone, oxyCODONE **OR** HYDROmorphone, labetalol, magnesium sulfate bolus IVPB, metoprolol tartrate,  ondansetron, phenol, polyethylene glycol ? ? ? ? ? ? ?

## 2022-02-22 LAB — GLUCOSE, CAPILLARY
Glucose-Capillary: 123 mg/dL — ABNORMAL HIGH (ref 70–99)
Glucose-Capillary: 133 mg/dL — ABNORMAL HIGH (ref 70–99)
Glucose-Capillary: 116 mg/dL — ABNORMAL HIGH (ref 70–99)
Glucose-Capillary: 110 mg/dL — ABNORMAL HIGH (ref 70–99)
Glucose-Capillary: 99 mg/dL (ref 70–99)

## 2022-02-22 LAB — CBC
HCT: 33.6 % — ABNORMAL LOW (ref 39.0–52.0)
Hemoglobin: 10.6 g/dL — ABNORMAL LOW (ref 13.0–17.0)
MCH: 29.6 pg (ref 26.0–34.0)
MCHC: 31.5 g/dL (ref 30.0–36.0)
MCV: 93.9 fL (ref 80.0–100.0)
Platelets: 158 10*3/uL (ref 150–400)
RBC: 3.58 MIL/uL — ABNORMAL LOW (ref 4.22–5.81)
RDW: 15.9 % — ABNORMAL HIGH (ref 11.5–15.5)
WBC: 10.4 10*3/uL (ref 4.0–10.5)
nRBC: 0 % (ref 0.0–0.2)

## 2022-02-22 LAB — BASIC METABOLIC PANEL
Anion gap: 13 (ref 5–15)
BUN: 25 mg/dL — ABNORMAL HIGH (ref 8–23)
CO2: 25 mmol/L (ref 22–32)
Calcium: 8.2 mg/dL — ABNORMAL LOW (ref 8.9–10.3)
Chloride: 97 mmol/L — ABNORMAL LOW (ref 98–111)
Creatinine, Ser: 4.58 mg/dL — ABNORMAL HIGH (ref 0.61–1.24)
GFR, Estimated: 13 mL/min — ABNORMAL LOW (ref >60)
Glucose, Bld: 172 mg/dL — ABNORMAL HIGH (ref 70–99)
Potassium: 4.2 mmol/L (ref 3.5–5.1)
Sodium: 135 mmol/L (ref 135–145)

## 2022-02-22 NOTE — Progress Notes (Signed)
[facility] ?Progress Note ? ?Subjective: Had dialysis yesterday with 3L UF. Comfortable this am.  ? ?Vitals:  ? 02/21/22 1533 02/21/22 2158 02/22/22 0354 02/22/22 0802  ?BP: (!) 148/50 (!) 134/45 (!) 157/38 (!) 126/39  ?Pulse: 75 95 74 73  ?Resp: '16 17 18 18  '$ ?Temp: 97.9 ?F (36.6 ?C) 98.1 ?F (36.7 ?C) 98 ?F (36.7 ?C) 98.4 ?F (36.9 ?C)  ?TempSrc:  Axillary  Oral  ?SpO2: 100% 98% 98% 98%  ?Weight:      ?Height:      ? ? ?Exam: ?Gen alert, chronically ill appearing adult ?Chest clear bilat to bases ?RRR no RG ?Abd soft ntnd no mass or ascites +bs ?Ext bilat AKA, dressings in place ?Neuro is alert and responsive ?   L IJ TDC ?  ? Home meds include - norvasc 10, auryxia 2 ac, reglan 5 tid, MVI, lyrica 50 tid, januvia, prns/ vits/ supps ?  ? OP HD: [facility] ? 3.5h   400/500  43.3kg  2K/3Ca bath  P2  LIJ TDC ? - rocaltrol 0.25 ug tiw ? - no esa/ Fe ? - last Hb 11.4 on 4/06 ? - last HD 4/06 yest ?  ?  ?  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 Continue on schedule. Next HD 4/11.  ?HTN/ vol - BP better after HD. Net UF 3L. Continue UF as able. Follow weights.  ?Hyperkalemia - Resolved with HD.  Getting renal diet. 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  ?MBD ckd - CCa in range, phos slightly ^. Cont binder, vdra.  ?Dementia ?COPD ?DM2 - not on insulin ?DNR ?  ?  ?[name] PA-C ?[facility] ?02/22/2022,1:08 PM ? ? ? ? ?Recent Labs  ?Lab 03/14/2022 ?0738 02/27/2022 ?0740 02/21/22 ?0145 02/21/22 ?4970 02/22/22 ?0123  ?HGB  --   --  11.3*  --  10.6*  ?ALBUMIN 3.3*  --   --   --   --   ?CALCIUM  --    < >  --  8.1* 8.2*  ?PHOS 6.4*  --   --   --   --   ?CREATININE  --    < >  --  8.37* 4.58*  ?K  --    < >  --  6.1* 4.2  ? < > = values in this interval not displayed.  ? ? ?Inpatient medications: ? amLODipine  10 mg Oral Daily  ? vitamin C  1,000 mg Oral Daily  ? atorvastatin  40 mg Oral Daily  ? calcitRIOL  0.25 mcg Oral Q T,Th,Sa-HD   ? Chlorhexidine Gluconate Cloth  6 each Topical Q0600  ? docusate sodium  100 mg Oral Daily  ? ferric citrate  420 mg Oral TID WC  ? insulin aspart  0-6 Units Subcutaneous TID WC  ? insulin aspart  2 Units Subcutaneous TID WC  ? metoCLOPramide  5 mg Oral TID AC  ? nutrition supplement (JUVEN)  1 packet Oral BID BM  ? pantoprazole  40 mg Oral Daily  ? pregabalin  50 mg Oral TID  ? zinc sulfate  220 mg Oral Daily  ? ? sodium chloride 10 mL/hr at 02/23/2022 0716  ? sodium chloride 75 mL/hr at 03/15/2022 1749  ? magnesium sulfate bolus IVPB    ? ?acetaminophen, albuterol, alum & mag hydroxide-simeth, bisacodyl, diphenhydrAMINE, guaiFENesin-dextromethorphan, hydrALAZINE, HYDROmorphone (DILAUDID) injection, oxyCODONE **  OR** HYDROmorphone, oxyCODONE **OR** HYDROmorphone, labetalol, magnesium sulfate bolus IVPB, metoprolol tartrate, ondansetron, phenol, polyethylene glycol ? ? ? ? ? ? ?

## 2022-02-22 NOTE — Plan of Care (Signed)

## 2022-02-22 NOTE — Progress Notes (Signed)
?  Subjective: ?Pain controlled.  Wound vacs are functional.  Patient resting in bed  ? ?Objective: ?Vital signs in last 24 hours: ?Temp:  [97.3 ?F (36.3 ?C)-98.4 ?F (36.9 ?C)] 98.4 ?F (36.9 ?C) (04/09 0802) ?Pulse Rate:  [44-95] 73 (04/09 0802) ?Resp:  [14-20] 18 (04/09 0802) ?BP: (88-171)/(35-90) 126/39 (04/09 0802) ?SpO2:  [98 %-100 %] 98 % (04/09 0802) ? ?Intake/Output from previous day: ?04/08 0701 - 04/09 0700 ?In: -  ?Out: 3000  ?Intake/Output this shift: ?No intake/output data recorded. ? ?Exam: ? ?No cellulitis present ? ?Labs: ?Recent Labs  ?  02/21/2022 ?0709 03/06/2022 ?0734 02/21/22 ?0145 02/22/22 ?0123  ?HGB 13.6 13.3 11.3* 10.6*  ? ?Recent Labs  ?  02/21/22 ?0145 02/22/22 ?0123  ?WBC 7.5 10.4  ?RBC 3.78* 3.58*  ?HCT 35.1* 33.6*  ?PLT 172 158  ? ?Recent Labs  ?  02/21/22 ?1517 02/22/22 ?0123  ?NA 135 135  ?K 6.1* 4.2  ?CL 103 97*  ?CO2 18* 25  ?BUN 54* 25*  ?CREATININE 8.37* 4.58*  ?GLUCOSE 139* 172*  ?CALCIUM 8.1* 8.2*  ? ?No results for input(s): LABPT, INR in the last 72 hours. ? ?Assessment/Plan: ?Plan at this time is continue with current management with placement pending for early next week ? ? ?Anderson Malta ?02/22/2022, 8:52 AM  ? ? ?  ?

## 2022-02-22 NOTE — Plan of Care (Signed)
?  Problem: Pain Managment: ?Goal: General experience of comfort will improve ?Outcome: Progressing ?  ?Problem: Skin Integrity: ?Goal: Risk for impaired skin integrity will decrease ?Outcome: Progressing ?  ?Problem: Activity: ?Goal: Ability to perform//tolerate increased activity and mobilize with assistive devices will improve ?Outcome: Progressing ?  ?Problem: Pain Management: ?Goal: Pain level will decrease with appropriate interventions ?Outcome: Progressing ?  ?Problem: Skin Integrity: ?Goal: Demonstration of wound healing without infection will improve ?Outcome: Progressing ?  ?

## 2022-02-22 NOTE — Progress Notes (Signed)
PT Cancellation Note ? ?Patient Details ?Name: Garrett Salas ?MRN: [id number] ?DOB: 10/07/1951 ? ? ?Cancelled Treatment:    Reason Eval/Treat Not Completed: Other (comment) ? ?Attempted PT eval, however pt sleepy, difficulty keeping eyes open;  ?Pt requested for PT to come back tomorrow;  ? ?Roney Marion, PT  ?Acute Rehabilitation Services ?Office [phone] ? ? ? ?Garrett Salas ?02/22/2022, 5:18 PM ?

## 2022-02-22 NOTE — Evaluation (Signed)
Occupational Therapy Evaluation ?Patient Details ?Name: Garrett Salas [facility] ?MRN: [id number] ?DOB: December 03, 1950 ?Today's Date: 02/22/2022 ? ? 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 ?Clinical Impression ?  ?Patient was living at home with wife and requires assist for ADL tasks and total dependence ofr IADLs,  Patient was using manual w/c for functional mobility prior to this hospital admission.  Patient currently requires mod A for rolling L<>R in bed and max A for supine to sit EOB with posterior lean noted once seated EOB requiring max A for upright sitting balance.  Patient requires total for for peri hygiene and max A for LB dressing to don brief while supine in bed.  Patient will benefit from additional OT intervention to address overall weakness and decreased independence in ADLs in order for patient to increase independence in self care and functional mobility. ?   ? ?Recommendations for follow up therapy are one component of a multi-disciplinary discharge planning process, led by the attending physician.  Recommendations may be updated based on patient status, additional functional criteria and insurance authorization.  ? ?Follow Up Recommendations ? Skilled nursing-short term rehab (<3 hours/day)  ?  ?Assistance Recommended at Discharge Frequent or constant Supervision/Assistance  ?Patient can return home with the following A lot of help with walking and/or transfers;A lot of help with bathing/dressing/bathroom;Assistance with cooking/housework;Direct supervision/assist for medications management;Direct supervision/assist for financial management;Assist for transportation;Help with stairs or ramp for entrance ? ?  ?Functional Status Assessment ? Patient has had a recent decline in their functional status and/or demonstrates limited ability to make  significant improvements in function in a reasonable and predictable amount of time  ?Equipment Recommendations ?    ?  ?Recommendations for Other Services   ? ? ?  ?Precautions / Restrictions Precautions ?Precautions: Fall  ? ?  ? ?Mobility Bed Mobility ?Overal bed mobility: Needs Assistance ?Bed Mobility: Supine to Sit, Sit to Supine ?  ?  ?Supine to sit: Max assist ?Sit to supine: Mod assist ?  ?  ?  ? ?Transfers ?Overall transfer level: Needs assistance ?  ?  ?  ?  ?  ?  ?  ?  ?General transfer comment: pamtient unable to tolerate sitting EOB ?  ? ?  ?Balance Overall balance assessment: Needs assistance ?Sitting-balance support: Bilateral upper extremity supported ?  ?Sitting balance - Comments: poor ?Postural control: Posterior lean ?  ?  ?  ?  ?  ?  ?  ?  ?  ?  ?  ?  ?  ?  ?   ? ?ADL either performed or assessed with clinical judgement  ? ?ADL Overall ADL's : Needs assistance/impaired ?Eating/Feeding: Set up ?  ?Grooming: Dance movement psychotherapist;Bed level;Set up ?  ?Upper Body Bathing: Minimal assistance;Bed level;Cueing for sequencing ?  ?Lower Body Bathing: Moderate assistance;Bed level ?  ?Upper Body Dressing : Minimal assistance;Bed level ?  ?Lower Body Dressing: Maximal assistance;Bed level ?  ?  ?  ?Toileting- Clothing Manipulation and Hygiene: Maximal assistance;Bed level ?  ?  ?  ?  ?   ? ? ? ?Vision Patient Visual Report: No change from baseline ?   ?   ?Perception   ?  ?Praxis   ?  ? ?Pertinent Vitals/Pain Pain Assessment ?Pain Assessment: No/denies pain  ? ? ? ?Hand Dominance Right ?  ?  Extremity/Trunk Assessment Upper Extremity Assessment ?Upper Extremity Assessment: Generalized weakness ?  ?Lower Extremity Assessment ?Lower Extremity Assessment: Defer to PT evaluation ?  ?Cervical / Trunk Assessment ?Cervical / Trunk Assessment: Normal ?  ?Communication Communication ?Communication: No difficulties ?  ?Cognition Arousal/Alertness: Awake/alert ?Behavior During Therapy: [facility] for tasks  assessed/performed ?Overall Cognitive Status: No family/caregiver present to determine baseline cognitive functioning ?  ?  ?  ?  ?  ?  ?  ?  ?  ?  ?  ?  ?  ?  ?  ?  ?  ?  ?  ?General Comments  b/l residual limbs with wound vac attached ? ?  ?Exercises   ?  ?Shoulder Instructions    ? ? ?Home Living Family/patient expects to be discharged to:: Private residence ?Living Arrangements: Spouse/significant other ?Available Help at Discharge: Family;Available PRN/intermittently ?Type of Home: Mobile home ?Home Access: Ramped entrance ?  ?  ?Home Layout: One level ?  ?  ?Bathroom Shower/Tub: Tub/shower unit ?  ?Bathroom Toilet: Standard ?  ?  ?Home Equipment: Wheelchair - Airline pilot (2 wheels);Hospital bed ?  ?Additional Comments: wife works ?  ? ?  ?Prior Functioning/Environment Prior Level of Function : Needs assist ?  ?  ?  ?  ?  ?  ?Mobility Comments: mobilizes in wc, lateral scoots with slideboard "whenever I can find one." ?ADLs Comments: has bilat prosthetics, unable to don 2/2 bilat wounds. It has been 2 years since he has walked. dresses indep. wife assists with bathing. wear depends, doesnot transfer on/offf toilet ?  ? ?  ?  ?OT Problem List: Decreased strength;Decreased activity tolerance;Decreased safety awareness ?  ?   ?OT Treatment/Interventions: Self-care/ADL training;Therapeutic exercise;Neuromuscular education;Therapeutic activities;Patient/family education;Balance training  ?  ?OT Goals(Current goals can be found in the care plan section) Acute Rehab OT Goals ?OT Goal Formulation: With patient ?Time For Goal Achievement: 03/07/22 ?Potential to Achieve Goals: Fair  ?OT Frequency: Min 2X/week ?  ? ?Co-evaluation   ?  ?  ?  ?  ? ?  ?AM-PAC OT "6 Clicks" Daily Activity     ?Outcome Measure Help from another person eating meals?: A Little ?Help from another person taking care of personal grooming?: A Little ?Help from another person toileting, which includes using toliet, bedpan, or  urinal?: Total ?Help from another person bathing (including washing, rinsing, drying)?: A Lot ?Help from another person to put on and taking off regular upper body clothing?: A Lot ?Help from another person to put on and taking off regular lower body clothing?: Total ?6 Click Score: 12 ?  ?End of Session Nurse Communication: Mobility status ? ?Activity Tolerance: Patient limited by pain ?Patient left: in bed;with call bell/phone within reach;with bed alarm set ? ?OT Visit Diagnosis: Muscle weakness (generalized) (M62.81)  ?              ?Time: 9811-9147 ?OT Time Calculation (min): 17 min ?Charges:  OT General Charges ?$OT Visit: 1 Visit ? ? ? ?Hadley Pen OTR/L ?02/22/2022, 1:33 PM ?

## 2022-02-23 ENCOUNTER — Other Ambulatory Visit: Payer: Self-pay | Admitting: *Deleted

## 2022-02-23 LAB — BASIC METABOLIC PANEL
Anion gap: 12 (ref 5–15)
BUN: 39 mg/dL — ABNORMAL HIGH (ref 8–23)
CO2: 23 mmol/L (ref 22–32)
Calcium: 8.1 mg/dL — ABNORMAL LOW (ref 8.9–10.3)
Chloride: 100 mmol/L (ref 98–111)
Creatinine, Ser: 6.15 mg/dL — ABNORMAL HIGH (ref 0.61–1.24)
GFR, Estimated: 9 mL/min — ABNORMAL LOW (ref >60)
Glucose, Bld: 102 mg/dL — ABNORMAL HIGH (ref 70–99)
Potassium: 4.1 mmol/L (ref 3.5–5.1)
Sodium: 135 mmol/L (ref 135–145)

## 2022-02-23 LAB — GLUCOSE, CAPILLARY
Glucose-Capillary: 88 mg/dL (ref 70–99)
Glucose-Capillary: 71 mg/dL (ref 70–99)
Glucose-Capillary: 120 mg/dL — ABNORMAL HIGH (ref 70–99)
Glucose-Capillary: 99 mg/dL (ref 70–99)
Glucose-Capillary: 98 mg/dL (ref 70–99)

## 2022-02-23 MED ORDER — CHLORHEXIDINE GLUCONATE CLOTH 2 % EX PADS
6.0000 | MEDICATED_PAD | Freq: Every day | CUTANEOUS | Status: DC
  Administered 2022-02-23 – 2022-02-26 (×4): 6 via TOPICAL

## 2022-02-23 NOTE — Care Management Important Message (Signed)
Important Message ? ?Patient Details  ?Name: Garrett Salas [facility] ?MRN: [id number] ?Date of Birth: [birth year] ? ? ?Medicare Important Message Given:  Yes ? ? ? ? ?Sarah Zerby ?02/23/2022, 4:26 PM ?

## 2022-02-23 NOTE — Consult Note (Signed)
? ?  [facility] CM Inpatient Consult ? ? ?02/23/2022 ? ?Carmen ?November 26, 1950 ?[id number] ? ?Status: Pending with EMMI Stroke follow up with [facility] RNCM ? ?Insurance:  [employer] ? ?Not eligible: Membership roster was used to verify patient status. Patient is being recommended for a skilled nursing facility. ? ?Plan: [facility]  RNCM Will close from [facility] for EMMI Stroke at this time for disposition is SNF. ? ?For additional questions or referrals please contact:   ? ?Natividad Brood, RN BSN CCM ?[facility] Liaison ? [phone] business mobile phone ?Toll free office [phone]  ?Fax number: [phone] ?[email or web address] ? ? ? ?

## 2022-02-23 NOTE — Plan of Care (Signed)
?  Problem: Education: Goal: Knowledge of General Education information will improve Description: Including pain rating scale, medication(s)/side effects and non-pharmacologic comfort measures Outcome: Progressing   Problem: Clinical Measurements: Goal: Ability to maintain clinical measurements within normal limits will improve Outcome: Progressing   Problem: Activity: Goal: Risk for activity intolerance will decrease Outcome: Progressing   Problem: Nutrition: Goal: Adequate nutrition will be maintained Outcome: Progressing   Problem: Coping: Goal: Level of anxiety will decrease Outcome: Progressing   Problem: Elimination: Goal: Will not experience complications related to bowel motility Outcome: Progressing   

## 2022-02-23 NOTE — Plan of Care (Signed)

## 2022-02-23 NOTE — Evaluation (Signed)
Physical Therapy Evaluation ? ?Patient Details ?Name: Garrett Salas [facility] ?MRN: [id number] ?DOB: 1951-06-06 ?Today's Date: 02/23/2022 ? ?History of Present Illness ? 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  ?Clinical Impression ? Pt admitted with above diagnosis. Pt currently with functional limitations due to the deficits listed below (see PT Problem List). At the time of PT eval pt was able to perform transfers into long sitting and rolling activity with up to mod assist. Pt lethargic and with difficulty maintaining eyes open at times. Poor historian but information taken from OT eval for PLOF. Pt will benefit from skilled PT to increase their independence and safety with mobility to allow discharge to the venue listed below.      ?   ? ?Recommendations for follow up therapy are one component of a multi-disciplinary discharge planning process, led by the attending physician.  Recommendations may be updated based on patient status, additional functional criteria and insurance authorization. ? ?Follow Up Recommendations Skilled nursing-short term rehab (<3 hours/day) ? ?  ?Assistance Recommended at Discharge    ?Patient can return home with the following ? Two people to help with walking and/or transfers;Two people to help with bathing/dressing/bathroom;Assist for transportation;Help with stairs or ramp for entrance;Assistance with cooking/housework ? ?  ?Equipment Recommendations Other (comment) (TBD by next venue of care)  ?Recommendations for Other Services ?    ?  ?Functional Status Assessment Patient has had a recent decline in their functional status and demonstrates the ability to make significant improvements in function in a reasonable and predictable amount of time.  ? ?  ?Precautions / Restrictions Precautions ?Precautions: Fall ?Precaution Comments: B wound  VAC's ?Restrictions ?Weight Bearing Restrictions: Yes ?RLE Weight Bearing: Non weight bearing ?LLE Weight Bearing: Non weight bearing  ? ?  ? ?Mobility ? Bed Mobility ?Overal bed mobility: Needs Assistance ?Bed Mobility: Supine to Sit, Sit to Supine, Rolling ?Rolling: Mod assist ?  ?Supine to sit: Mod assist ?Sit to supine: Mod assist ?  ?General bed mobility comments: Pt transitioned into long sitting and then laid back down in bed. Unable to scoot around to sit EOB or prepare for transfer (attempted positioning for lateral transfer as well as A>P transfer). He was able to roll several times for pad positioning and placement. ?  ? ?Transfers ?  ?  ?  ?  ?  ?  ?  ?  ?  ?General transfer comment: Unable to progress to OOB transfers ?  ? ?Ambulation/Gait ?  ?  ?  ?  ?  ?  ?  ?  ? ?Stairs ?  ?  ?  ?  ?  ? ?Wheelchair Mobility ?  ? ?Modified Rankin (Stroke Patients Only) ?  ? ?  ? ?Balance Overall balance assessment: Needs assistance ?Sitting-balance support: Bilateral upper extremity supported ?Sitting balance-Leahy Scale: Poor ?Sitting balance - Comments: poor ?Postural control: Posterior lean ?  ?  ?  ?  ?  ?  ?  ?  ?  ?  ?  ?  ?  ?  ?   ? ? ? ?Pertinent Vitals/Pain Pain Assessment ?Pain Assessment: Faces ?Faces Pain Scale: Hurts even more ?Pain Location: B residual limbs ?Pain Descriptors / Indicators: Operative site guarding, Sore ?Pain Intervention(s): Monitored during session, Limited activity within patient's tolerance, Repositioned  ? ? ?Home Living Family/patient expects to  be discharged to:: Private residence ?Living Arrangements: Spouse/significant other ?Available Help at Discharge: Family;Available PRN/intermittently ?Type of Home: Mobile home ?Home Access: Ramped entrance ?  ?  ?  ?Home Layout: One level ?Home Equipment: Wheelchair - Airline pilot (2 wheels);Hospital bed ?Additional Comments: wife works  ?  ?Prior Function Prior Level of Function : Needs assist ?  ?  ?  ?  ?  ?   ?Mobility Comments: mobilizes in wc, lateral scoots with slideboard "whenever I can find one." ?ADLs Comments: has bilat prosthetics, unable to don 2/2 bilat wounds. It has been 2 years since he has walked. dresses indep. wife assists with bathing. wear depends, does not transfer on/off toilet ?  ? ? ?Hand Dominance  ? Dominant Hand: Right ? ?  ?Extremity/Trunk Assessment  ? Upper Extremity Assessment ?Upper Extremity Assessment: Defer to OT evaluation ?  ? ?Lower Extremity Assessment ?Lower Extremity Assessment: Generalized weakness;RLE deficits/detail;LLE deficits/detail ?RLE Deficits / Details: Acute pain, decreased strength and AROM consistent with new amputation ?LLE Deficits / Details: Acute pain, decreased strength and AROM consistent with new amputation ?  ? ?Cervical / Trunk Assessment ?Cervical / Trunk Assessment: Normal  ?Communication  ? Communication: No difficulties  ?Cognition Arousal/Alertness: Lethargic ?Behavior During Therapy: [facility] for tasks assessed/performed ?Overall Cognitive Status: No family/caregiver present to determine baseline cognitive functioning ?  ?  ?  ?  ?  ?  ?  ?  ?  ?  ?  ?  ?  ?  ?  ?  ?General Comments: Pt rouses when therapist speaks to him but moving slow and quickly closing his eyes again. ?  ?  ? ?  ?General Comments   ? ?  ?Exercises    ? ?Assessment/Plan  ?  ?PT Assessment Patient needs continued PT services  ?PT Problem List Decreased strength;Decreased range of motion;Decreased activity tolerance;Decreased balance;Decreased mobility;Decreased knowledge of use of DME;Decreased cognition;Decreased safety awareness;Pain ? ?   ?  ?PT Treatment Interventions DME instruction;Functional mobility training;Therapeutic activities;Therapeutic exercise;Neuromuscular re-education;Patient/family education;Wheelchair mobility training   ? ?PT Goals (Current goals can be found in the Care Plan section)  ?Acute Rehab PT Goals ?Patient Stated Goal: None stated ?PT Goal Formulation:  Patient unable to participate in goal setting ?Time For Goal Achievement: 03/09/22 ?Potential to Achieve Goals: Fair ? ?  ?Frequency Min 2X/week ?  ? ? ?Co-evaluation   ?  ?  ?  ?  ? ? ?  ?AM-PAC PT "6 Clicks" Mobility  ?Outcome Measure Help needed turning from your back to your side while in a flat bed without using bedrails?: A Lot ?Help needed moving from lying on your back to sitting on the side of a flat bed without using bedrails?: A Lot ?Help needed moving to and from a bed to a chair (including a wheelchair)?: Total ?Help needed standing up from a chair using your arms (e.g., wheelchair or bedside chair)?: Total ?Help needed to walk in hospital room?: Total ?Help needed climbing 3-5 steps with a railing? : Total ?6 Click Score: 8 ? ?  ?End of Session   ?Activity Tolerance: Patient limited by lethargy;Patient limited by fatigue ?Patient left: in bed;with call bell/phone within reach;with bed alarm set ?Nurse Communication: Mobility status;Need for lift equipment ?PT Visit Diagnosis: Other abnormalities of gait and mobility (R26.89);Muscle weakness (generalized) (M62.81) ?  ? ?Time: 1884-1660 ?PT Time Calculation (min) (ACUTE ONLY): 17 min ? ? ?Charges:   PT Evaluation ?$PT Eval High Complexity: 1 High ?  ?  ?   ? ? ?  Rolinda Roan, PT, DPT ?Acute Rehabilitation Services ?Secure Chat Preferred ?Office: [phone]  ? ?Thelma Comp ?02/23/2022, 4:01 PM ? ?

## 2022-02-23 NOTE — Progress Notes (Signed)
Contacted CSW for possible update on snf placement. If pt goes to snf in [place], HD schedule will likely need to changed to MWF. Pt was TTS prior to admission. Will assist as needed.  ? ?Melven Sartorius ?Renal Navigator ?[phone] ?

## 2022-02-23 NOTE — Progress Notes (Signed)
?[facility] ?Progress Note  ? ?Subjective:   Pt laying in bed. Reports he feels well, no pain today. Denies SOB, CP, dizziness, abdominal pain and nausea.  ? ?Objective ?Vitals:  ? 02/22/22 1927 02/22/22 2336 02/23/22 0420 02/23/22 6789  ?BP: (!) 127/54 (!) 131/44 (!) 143/46 (!) 166/54  ?Pulse: 79 73 78 85  ?Resp: '16 16 16 16  '$ ?Temp: 98.1 ?F (36.7 ?C) 98.6 ?F (37 ?C) 98.9 ?F (37.2 ?C) 97.9 ?F (36.6 ?C)  ?TempSrc: Oral Oral Oral Oral  ?SpO2: 94% 91% 100% 100%  ?Weight:      ?Height:      ? ?Physical Exam ?General: Chronically ill appearing male, alert and in NAD ?Heart: RRR, no murmurs, rubs or gallops ?Lungs: CTA bilaterally without wheezing, rhonchi or rales ?Abdomen: Soft, non-distended, +BS ?Extremities: B/l AKA with wound vacs in place, no significant edema ?Dialysis Access: L IJ TDC ? ?Additional Objective ?Labs: ?Basic Metabolic Panel: ?Recent Labs  ?Lab 02/19/2022 ?0738 03/03/2022 ?0740 02/21/22 ?3810 02/22/22 ?0123 02/23/22 ?0015  ?NA  --    < > 135 135 135  ?K  --    < > 6.1* 4.2 4.1  ?CL  --    < > 103 97* 100  ?CO2  --    < > 18* 25 23  ?GLUCOSE  --    < > 139* 172* 102*  ?BUN  --    < > 54* 25* 39*  ?CREATININE  --    < > 8.37* 4.58* 6.15*  ?CALCIUM  --    < > 8.1* 8.2* 8.1*  ?PHOS 6.4*  --   --   --   --   ? < > = values in this interval not displayed.  ? ?Liver Function Tests: ?Recent Labs  ?Lab 03/02/2022 ?0738  ?ALBUMIN 3.3*  ? ?No results for input(s): LIPASE, AMYLASE in the last 168 hours. ?CBC: ?Recent Labs  ?Lab 03/06/2022 ?1751 02/21/22 ?0145 02/22/22 ?0123  ?WBC  --  7.5 10.4  ?HGB 13.3 11.3* 10.6*  ?HCT 39.0 35.1* 33.6*  ?MCV  --  92.9 93.9  ?PLT  --  172 158  ? ?Blood Culture ?   ?Component Value Date/Time  ? SDES BLOOD RIGHT FOREARM 09/03/2018 1630  ? SPECREQUEST  09/03/2018 1630  ?  BOTTLES DRAWN AEROBIC ONLY Blood Culture adequate volume  ? CULT  09/03/2018 1630  ?  NO GROWTH 5 DAYS ?Performed at [facility], [place] [address], [place], [place] [address] ?  ? REPTSTATUS  09/08/2018 FINAL 09/03/2018 1630  ? ? ?Cardiac Enzymes: ?No results for input(s): CKTOTAL, CKMB, CKMBINDEX, TROPONINI in the last 168 hours. ?CBG: ?Recent Labs  ?Lab 02/22/22 ?1622 02/22/22 ?2039 02/22/22 ?2109 02/23/22 ?2778 02/23/22 ?2423  ?GLUCAP 116* 99 110* 88 71  ? ?Iron Studies: No results for input(s): IRON, TIBC, TRANSFERRIN, FERRITIN in the last 72 hours. ?'@lablastinr3'$ @ ?Studies/Results: ?No results found. ?Medications: ? sodium chloride 10 mL/hr at 03/13/2022 0716  ? magnesium sulfate bolus IVPB    ? ? amLODipine  10 mg Oral Daily  ? vitamin C  1,000 mg Oral Daily  ? atorvastatin  40 mg Oral Daily  ? calcitRIOL  0.25 mcg Oral Q T,Th,Sa-HD  ? Chlorhexidine Gluconate Cloth  6 each Topical Q0600  ? docusate sodium  100 mg Oral Daily  ? ferric citrate  420 mg Oral TID WC  ? insulin aspart  0-6 Units Subcutaneous TID WC  ? insulin aspart  2 Units Subcutaneous TID WC  ?  metoCLOPramide  5 mg Oral TID AC  ? nutrition supplement (JUVEN)  1 packet Oral BID BM  ? pantoprazole  40 mg Oral Daily  ? pregabalin  50 mg Oral TID  ? zinc sulfate  220 mg Oral Daily  ? ? ?Dialysis Orders: ?TTS [facility] ? 3.5h   400/500  43.3kg  2K/3Ca bath  P2  LIJ TDC ? - rocaltrol 0.25 ug tiw ? - no esa/ Fe ? - last Hb 11.4 on 4/06 ? - last HD 4/06 yest ? 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 Continue on schedule. Next HD 4/11.  ?HTN/ vol - BP better after HD. Net UF 3L. Continue UF as able. Follow weights.  ?Hyperkalemia - Resolved with HD.  Getting renal diet. 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 Cont binder, vdra.  ?Dementia ?COPD ?DM2 - not on insulin ?DNR ?  ? ?Anice Paganini, PA-C ?02/23/2022, 9:08 AM  ?[facility] ?Pager: [phone] ? ? ?

## 2022-02-23 NOTE — Progress Notes (Signed)
Patient ID: Garrett Salas, male   DOB: 1951/10/22, 71 y.o.   MRN: [id number] ?Patient is awake lying on his right side sideways in bed.  There is no drainage in either wound VAC canister.  Anticipate discharge to skilled nursing.  Patient does complain of pain in the residual limbs. ?

## 2022-02-23 NOTE — Patient Outreach (Signed)
[facility]) Care Management ? ?02/23/2022 ? ?[place] ?1951-11-03 ?[id number] ? ? ?Noted member admitted to hospital on 4/7 for bilateral AKA, will be discharged to SNF.  Will close case at this time. ? ?Valente David, RN, MSN, CCM ?[facility] Care Management  ?Community Care Manager ?[phone] ? ?

## 2022-02-24 LAB — CBC
HCT: 31.2 % — ABNORMAL LOW (ref 39.0–52.0)
Hemoglobin: 10 g/dL — ABNORMAL LOW (ref 13.0–17.0)
MCH: 30 pg (ref 26.0–34.0)
MCHC: 32.1 g/dL (ref 30.0–36.0)
MCV: 93.7 fL (ref 80.0–100.0)
Platelets: 181 10*3/uL (ref 150–400)
RBC: 3.33 MIL/uL — ABNORMAL LOW (ref 4.22–5.81)
RDW: 15.5 % (ref 11.5–15.5)
WBC: 11.4 10*3/uL — ABNORMAL HIGH (ref 4.0–10.5)
nRBC: 0 % (ref 0.0–0.2)

## 2022-02-24 LAB — RENAL FUNCTION PANEL
Albumin: 2.8 g/dL — ABNORMAL LOW (ref 3.5–5.0)
Anion gap: 15 (ref 5–15)
BUN: 58 mg/dL — ABNORMAL HIGH (ref 8–23)
CO2: 21 mmol/L — ABNORMAL LOW (ref 22–32)
Calcium: 8.2 mg/dL — ABNORMAL LOW (ref 8.9–10.3)
Chloride: 99 mmol/L (ref 98–111)
Creatinine, Ser: 8.27 mg/dL — ABNORMAL HIGH (ref 0.61–1.24)
GFR, Estimated: 6 mL/min — ABNORMAL LOW (ref >60)
Glucose, Bld: 78 mg/dL (ref 70–99)
Phosphorus: 5.9 mg/dL — ABNORMAL HIGH (ref 2.5–4.6)
Potassium: 4.6 mmol/L (ref 3.5–5.1)
Sodium: 135 mmol/L (ref 135–145)

## 2022-02-24 LAB — GLUCOSE, CAPILLARY
Glucose-Capillary: 110 mg/dL — ABNORMAL HIGH (ref 70–99)
Glucose-Capillary: 92 mg/dL (ref 70–99)
Glucose-Capillary: 163 mg/dL — ABNORMAL HIGH (ref 70–99)
Glucose-Capillary: 152 mg/dL — ABNORMAL HIGH (ref 70–99)

## 2022-02-24 LAB — SURGICAL PATHOLOGY

## 2022-02-24 MED ORDER — PREGABALIN 50 MG PO CAPS: 50.0000 mg | ORAL_CAPSULE | Freq: Three times a day (TID) | ORAL | 0 refills | Status: AC | PRN

## 2022-02-24 MED ORDER — HEPARIN SODIUM (PORCINE) 1000 UNIT/ML IJ SOLN
INTRAMUSCULAR | Status: AC
  Filled 2022-02-24: qty 4

## 2022-02-24 MED ORDER — OXYCODONE-ACETAMINOPHEN 5-325 MG PO TABS: 1.0000 | ORAL_TABLET | ORAL | 0 refills | Status: AC | PRN

## 2022-02-24 NOTE — TOC Initial Note (Signed)
Transition of Care (TOC) - Initial/Assessment Note  ? ? ?Patient Details  ?Name: Garrett Salas [facility] ?MRN: [id number] ?Date of Birth: Nov 10, 1951 ? ?Transition of Care [facility]) CM/SW Contact:    ?Joanne Chars, LCSW ?Phone Number: ?02/24/2022, 12:19 PM ? ?Clinical Narrative:       CSW met with pt regarding DC recommendation for SNF.  Pt orientation fluctuating per chart between x1 and x3.  X3 currently.  Pt able to participate in assessment, agreeable to SNF, choice document given, permission given to speak with wife Hector Brunswick and brother Liliane Channel, who are listed contacts.  Permission given to send out referral in hub. ? ?Both wife and brother have same number listed, CSW attempted to call, no answer, unable to leave message.  ? ?Pt lives in [place], [place] in [place].  Referral sent out in hub for SNF.          ? ? ?Expected Discharge Plan: [place] ?Barriers to Discharge: SNF Pending bed offer ? ? ?Patient Goals and CMS Choice ?Patient states their goals for this hospitalization and ongoing recovery are:: "live a productive life" ?CMS Medicare.gov Compare Post Acute Care list provided to:: Patient ?Choice offered to / list presented to : Patient ? ?Expected Discharge Plan and Services ?Expected Discharge Plan: [place] ?In-house Referral: Clinical Social Work ?  ?Post Acute Care Choice: [place] ?Living arrangements for the past 2 months: [place] ?Expected Discharge Date: 02/24/22               ?  ?  ?  ?  ?  ?  ?  ?  ?  ?  ? ?Prior Living Arrangements/Services ?Living arrangements for the past 2 months: [place] ?Lives with:: Spouse ?Patient language and need for interpreter reviewed:: Yes ?Do you feel safe going back to the place where you live?: Yes      ?Need for Family Participation in Patient Care: Yes (Comment) ?Care giver support system in place?: Yes (comment) ?Current home services: Other (comment) (none) ?Criminal Activity/Legal Involvement  Pertinent to Current Situation/Hospitalization: No - Comment as needed ? ?Activities of Daily Living ?Home Assistive Devices/Equipment: Cane (specify quad or straight), Walker (specify type), Eyeglasses, CBG Meter, Blood pressure cuff ?ADL Screening (condition at time of admission) ?Patient's cognitive ability adequate to safely complete daily activities?: Yes ?Is the patient deaf or have difficulty hearing?: No ?Does the patient have difficulty seeing, even when wearing glasses/contacts?: No ?Does the patient have difficulty concentrating, remembering, or making decisions?: No ?Patient able to express need for assistance with ADLs?: Yes ?Does the patient have difficulty dressing or bathing?: No ?Independently performs ADLs?: No ?Does the patient have difficulty walking or climbing stairs?: No ?Weakness of Legs: Both ?Weakness of Arms/Hands: None ? ?Permission Sought/Granted ?Permission sought to share information with : Family Supports ?Permission granted to share information with : Yes, Verbal Permission Granted ? Share Information with NAME: wife Hector Brunswick, brother Liliane Channel ? Permission granted to share info w AGENCY: SNF ?   ?   ? ?Emotional Assessment ?Appearance:: Appears stated age ?Attitude/Demeanor/Rapport: Engaged ?Affect (typically observed): Pleasant, Withdrawn (drowsy) ?Orientation: : Oriented to Self, Oriented to Place, Oriented to Situation ?Alcohol / Substance Use: Not Applicable ?Psych Involvement: No (comment) ? ?Admission diagnosis:  S/P AKA (above knee amputation) bilateral ([place]) [L84.536, Z89.612] ?Patient Active Problem List  ? Diagnosis Date Noted  ? S/P AKA (above knee amputation) bilateral ([place]) 03/01/2022  ? Lacunar infarct, acute ([place]) 01/24/2022  ? Sepsis ([place])  09/03/2018  ? Hypoxia 09/03/2018  ? Chest pain 09/03/2018  ? COPD exacerbation ([place]) 09/03/2018  ? Tobacco abuse 06/04/2017  ? Marijuana abuse, continuous 01/28/2016  ? Pneumonia involving right lung 01/28/2016  ? Thrombocytopenia ([place])  01/28/2016  ? Acute respiratory failure with hypoxia ([place]) 01/28/2016  ? Elevated troponin I level 01/28/2016  ? Benign essential HTN 01/28/2016  ? S/P bilateral BKA (below knee amputation) ([place]) 01/28/2016  ? Type 2 diabetes mellitus ([place]) 01/28/2016  ? Phantom limb pain- Left stump 06/21/2015  ? Postoperative anemia due to acute blood loss 05/18/2015  ? Protein-calorie malnutrition, severe ([place]) 05/17/2015  ? Critical lower limb ischemia (HCC)   ? ESRD on hemodialysis ([place])   ? Hypertensive urgency   ? Acute encephalopathy   ? Elevated troponin   ? Generalized weakness 05/14/2015  ? Gangrene of foot ([place]) 05/14/2015  ? Hypertensive heart disease 05/14/2015  ? Gangrene of lower extremity ([place]) 05/14/2015  ? S/P BKA (below knee amputation) bilateral ([place]) 02/07/2015  ? PAD (peripheral artery disease) ([place]) 01/07/2015  ? ESRD on dialysis ([place]) 01/04/2015  ? HLD (hyperlipidemia) 01/04/2015  ? DM (diabetes mellitus) type II, controlled, with peripheral vascular disorder ([place]) 01/04/2015  ? Atherosclerosis of native arteries of the extremities with gangrene ([place]) 01/04/2015  ? Type 2 diabetes mellitus with diabetic peripheral angiopathy without gangrene, with long-term current use of insulin ([place]) 01/04/2015  ? Mixed hyperlipidemia 01/04/2015  ? Dyspnea 05/18/2011  ? Hypertension   ? ?PCP:  [name], MD ?Pharmacy:   ?[id number] [address], [place] ?[place] ?[place] [address] ?Phone: [phone] Fax: [phone] ? ?[facility] - [place], [place] - [address] ?[address] ?[address], [address] ?[place] [address] ?Phone: [phone] Fax: [phone] ? ? ? ? ?Social Determinants of Health (SDOH) Interventions ?  ? ?Readmission Risk Interventions ?   ? View : No data to display.  ?  ?  ?  ? ? ? ?

## 2022-02-24 NOTE — Progress Notes (Signed)
?[facility] ?Progress Note  ? ?Subjective:   Seen and examined on HD.  BFR 350 mL/ min via TDC, UF goal 1.5L.  No complaints today.  Nods his head yes/ no but doesn't open his eyes ? ?Objective ?Vitals:  ? 02/24/22 0900 02/24/22 0930 02/24/22 1000 02/24/22 1133  ?BP: (!) 133/41 (!) 123/35 (!) 130/32 (!) 150/46  ?Pulse:    82  ?Resp: '16 15 16 20  '$ ?Temp:   97.8 ?F (36.6 ?C) 98.3 ?F (36.8 ?C)  ?TempSrc:   Tympanic   ?SpO2:   98% 98%  ?Weight:   39.8 kg   ?Height:      ? ?Physical Exam ?General: Chronically ill appearing male, eyes closed but alert ?Heart: RRR, no murmurs, rubs or gallops ?Lungs: CTA bilaterally without wheezing, rhonchi or rales ?Abdomen: Soft, non-distended, +BS ?Extremities: B/l AKA with wound vacs in place, no significant edema ?Dialysis Access: L IJ TDC ? ?Additional Objective ?Labs: ?Basic Metabolic Panel: ?Recent Labs  ?Lab 02/15/2022 ?0738 02/28/2022 ?0740 02/22/22 ?0123 02/23/22 ?0015 02/24/22 ?0700  ?NA  --    < > 135 135 135  ?K  --    < > 4.2 4.1 4.6  ?CL  --    < > 97* 100 99  ?CO2  --    < > 25 23 21*  ?GLUCOSE  --    < > 172* 102* 78  ?BUN  --    < > 25* 39* 58*  ?CREATININE  --    < > 4.58* 6.15* 8.27*  ?CALCIUM  --    < > 8.2* 8.1* 8.2*  ?PHOS 6.4*  --   --   --  5.9*  ? < > = values in this interval not displayed.  ? ?Liver Function Tests: ?Recent Labs  ?Lab 03/01/2022 ?0738 02/24/22 ?0700  ?ALBUMIN 3.3* 2.8*  ? ?No results for input(s): LIPASE, AMYLASE in the last 168 hours. ?CBC: ?Recent Labs  ?Lab 02/21/22 ?0145 02/22/22 ?0123 02/24/22 ?0700  ?WBC 7.5 10.4 11.4*  ?HGB 11.3* 10.6* 10.0*  ?HCT 35.1* 33.6* 31.2*  ?MCV 92.9 93.9 93.7  ?PLT 172 158 181  ? ?Blood Culture ?   ?Component Value Date/Time  ? SDES BLOOD RIGHT FOREARM 09/03/2018 1630  ? SPECREQUEST  09/03/2018 1630  ?  BOTTLES DRAWN AEROBIC ONLY Blood Culture adequate volume  ? CULT  09/03/2018 1630  ?  NO GROWTH 5 DAYS ?Performed at [facility], [place] [address] [place], [place] [address] ?  ? REPTSTATUS  09/08/2018 FINAL 09/03/2018 1630  ? ? ?Cardiac Enzymes: ?No results for input(s): CKTOTAL, CKMB, CKMBINDEX, TROPONINI in the last 168 hours. ?CBG: ?Recent Labs  ?Lab 02/23/22 ?1219 02/23/22 ?1640 02/23/22 ?1949 02/24/22 ?0429 02/24/22 ?1132  ?GLUCAP 120* 99 98 110* 92  ? ?Iron Studies: No results for input(s): IRON, TIBC, TRANSFERRIN, FERRITIN in the last 72 hours. ?'@lablastinr3'$ @ ?Studies/Results: ?No results found. ?Medications: ? sodium chloride 10 mL/hr at 03/06/2022 0716  ? magnesium sulfate bolus IVPB    ? ? amLODipine  10 mg Oral Daily  ? vitamin C  1,000 mg Oral Daily  ? atorvastatin  40 mg Oral Daily  ? calcitRIOL  0.25 mcg Oral Q T,Th,Sa-HD  ? Chlorhexidine Gluconate Cloth  6 each Topical Q0600  ? docusate sodium  100 mg Oral Daily  ? ferric citrate  420 mg Oral TID WC  ? heparin sodium (porcine)      ? insulin aspart  0-6 Units Subcutaneous TID WC  ?  insulin aspart  2 Units Subcutaneous TID WC  ? metoCLOPramide  5 mg Oral TID AC  ? nutrition supplement (JUVEN)  1 packet Oral BID BM  ? pantoprazole  40 mg Oral Daily  ? pregabalin  50 mg Oral TID  ? zinc sulfate  220 mg Oral Daily  ? ? ?Dialysis Orders: ?TTS [facility] ? 3.5h   400/500  43.3kg  2K/3Ca bath  P2  LIJ TDC ? - rocaltrol 0.25 ug tiw ? - no esa/ Fe ? - last Hb 11.4 on 4/06 ? - last HD 4/06 yest ? 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 Continue on schedule. Next HD 4/11.  ?HTN/ vol - BP better after HD. Net UF 3L. Continue UF as able. Follow weights.  ?Hyperkalemia - Resolved with HD.  Getting renal diet. 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 Cont binder, vdra.  ?Dementia ?COPD ?DM2 - not on insulin ?DNR ?Dispo: will need SNF ?  ?Madelon Lips MD ?02/24/2022, 12:15 PM  ?[facility] ?Pager: [phone] ? ? ?

## 2022-02-24 NOTE — Progress Notes (Signed)
Patient to dialysis.

## 2022-02-24 NOTE — [place] FL2 (Signed)
?[place] MEDICAID FL2 LEVEL OF CARE SCREENING TOOL  ?  ? ?IDENTIFICATION  ?Patient Name: ?Garrett Salas [facility] Birthdate: August 28, 1951 Sex: male Admission Date (Current Location): ?02/16/2022  ?[place] and [place] Number: ? [place] ?[id number] [place] and Address:  ?The [place], [place] [address], [place], [place] [address] ?     Provider Number: ?[id number]  ?Attending Physician Name and Address:  ?Newt Minion, MD ? Relative Name and Phone Number:  ?Dempsey, Ahonen Spouse [phone] ?   ?Current Level of Care: ?Hospital Recommended Level of Care: ?[place] Prior Approval Number: ?  ? ?Date Approved/Denied: ?  PASRR Number: ?[id number] A ? ?Discharge Plan: ?SNF ?  ? ?Current Diagnoses: ?Patient Active Problem List  ? Diagnosis Date Noted  ? S/P AKA (above knee amputation) bilateral ([place]) 03/08/2022  ? Lacunar infarct, acute ([place]) 01/24/2022  ? Sepsis ([place]) 09/03/2018  ? Hypoxia 09/03/2018  ? Chest pain 09/03/2018  ? COPD exacerbation ([place]) 09/03/2018  ? Tobacco abuse 06/04/2017  ? Marijuana abuse, continuous 01/28/2016  ? Pneumonia involving right lung 01/28/2016  ? Thrombocytopenia ([place]) 01/28/2016  ? Acute respiratory failure with hypoxia ([place]) 01/28/2016  ? Elevated troponin I level 01/28/2016  ? Benign essential HTN 01/28/2016  ? S/P bilateral BKA (below knee amputation) ([place]) 01/28/2016  ? Type 2 diabetes mellitus ([place]) 01/28/2016  ? Phantom limb pain- Left stump 06/21/2015  ? Postoperative anemia due to acute blood loss 05/18/2015  ? Protein-calorie malnutrition, severe ([place]) 05/17/2015  ? Critical lower limb ischemia (HCC)   ? ESRD on hemodialysis ([place])   ? Hypertensive urgency   ? Acute encephalopathy   ? Elevated troponin   ? Generalized weakness 05/14/2015  ? Gangrene of foot ([place]) 05/14/2015  ? Hypertensive heart disease 05/14/2015  ? Gangrene of lower extremity ([place]) 05/14/2015  ? S/P BKA (below knee amputation) bilateral ([place]) 02/07/2015  ? PAD (peripheral artery  disease) ([place]) 01/07/2015  ? ESRD on dialysis ([place]) 01/04/2015  ? HLD (hyperlipidemia) 01/04/2015  ? DM (diabetes mellitus) type II, controlled, with peripheral vascular disorder ([place]) 01/04/2015  ? Atherosclerosis of native arteries of the extremities with gangrene ([place]) 01/04/2015  ? Type 2 diabetes mellitus with diabetic peripheral angiopathy without gangrene, with long-term current use of insulin ([place]) 01/04/2015  ? Mixed hyperlipidemia 01/04/2015  ? Dyspnea 05/18/2011  ? Hypertension   ? ? ?Orientation RESPIRATION BLADDER Height & Weight   ?  ?Self, Situation, Place ? Normal Continent Weight: 87 lb 11.9 oz (39.8 kg) ?Height:  '5\' 4"'$  (162.6 cm)  ?BEHAVIORAL SYMPTOMS/MOOD NEUROLOGICAL BOWEL NUTRITION STATUS  ?    Continent Diet (see discharge summary)  ?AMBULATORY STATUS COMMUNICATION OF NEEDS Skin   ?Total Care Verbally Surgical wounds ?  ?  ?  ?    ?     ?     ? ? ?Personal Care Assistance Level of Assistance  ?Bathing, Feeding, Dressing Bathing Assistance: Maximum assistance ?Feeding assistance: Limited assistance ?Dressing Assistance: Maximum assistance ?   ? ?Functional Limitations Info  ?Sight, Hearing, Speech Sight Info: Adequate ?Hearing Info: Adequate ?Speech Info: Adequate  ? ? ?SPECIAL CARE FACTORS FREQUENCY  ?PT (By licensed PT), OT (By licensed OT)   ?  ?PT Frequency: 5x week ?OT Frequency: 5x week ?  ?  ?  ?   ? ? ?Contractures Contractures Info: Not present  ? ? ?Additional Factors Info  ?Code Status, Allergies, Insulin Sliding Scale Code Status Info: DNR ?Allergies Info: Lisinopril, Aspirin ?  ?Insulin Sliding Scale Info: Novolog,  0-6 units TID with meals, 2 units TID with meals.  See discharge summary. ?  ?   ? ?Current Medications (02/24/2022):  This is the current hospital active medication list ?Current Facility-Administered Medications  ?Medication Dose Route Frequency Provider Last Rate Last Admin  ? 0.9 %  sodium chloride infusion   Intravenous Continuous Pervis Hocking, DO 10 mL/hr at  02/22/2022 0716 New Bag at 02/16/2022 0926  ? acetaminophen (TYLENOL) tablet 325-650 mg  325-650 mg Oral Q6H PRN Newt Minion, MD      ? albuterol (PROVENTIL) (2.5 MG/3ML) 0.083% nebulizer solution 2.5 mg  2.5 mg Nebulization Q6H PRN Paytes, Earl Gala, RPH      ? alum & mag hydroxide-simeth (MAALOX/MYLANTA) 200-200-20 MG/5ML suspension 15-30 mL  15-30 mL Oral Q2H PRN Newt Minion, MD      ? amLODipine (NORVASC) tablet 10 mg  10 mg Oral Daily Newt Minion, MD   10 mg at 02/24/22 1122  ? ascorbic acid (VITAMIN C) tablet 1,000 mg  1,000 mg Oral Daily Newt Minion, MD   1,000 mg at 02/24/22 1121  ? atorvastatin (LIPITOR) tablet 40 mg  40 mg Oral Daily Newt Minion, MD   40 mg at 02/24/22 1122  ? bisacodyl (DULCOLAX) EC tablet 5 mg  5 mg Oral Daily PRN Newt Minion, MD      ? calcitRIOL (ROCALTROL) capsule 0.25 mcg  0.25 mcg Oral Q T,Th,Sa-HD Roney Jaffe, MD   0.25 mcg at 02/24/22 1123  ? Chlorhexidine Gluconate Cloth 2 % PADS 6 each  6 each Topical Q0600 Janalee Dane, PA-C   6 each at 02/24/22 1517  ? diphenhydrAMINE (BENADRYL) 12.5 MG/5ML elixir 12.5-25 mg  12.5-25 mg Oral Q4H PRN Newt Minion, MD      ? docusate sodium (COLACE) capsule 100 mg  100 mg Oral Daily Newt Minion, MD   100 mg at 02/24/22 1121  ? ferric citrate (AURYXIA) tablet 420 mg  420 mg Oral TID WC Newt Minion, MD   420 mg at 02/24/22 1121  ? guaiFENesin-dextromethorphan (ROBITUSSIN DM) 100-10 MG/5ML syrup 15 mL  15 mL Oral Q4H PRN Newt Minion, MD      ? heparin sodium (porcine) 1000 UNIT/ML injection           ? hydrALAZINE (APRESOLINE) injection 5 mg  5 mg Intravenous Q20 Min PRN Newt Minion, MD   5 mg at 03/10/2022 1742  ? HYDROmorphone (DILAUDID) injection 0.5-1 mg  0.5-1 mg Intravenous Q4H PRN Newt Minion, MD   1 mg at 02/23/22 0654  ? oxyCODONE (Oxy IR/ROXICODONE) immediate release tablet 5-10 mg  5-10 mg Oral Q4H PRN Newt Minion, MD   10 mg at 02/22/22 6160  ? Or  ? HYDROmorphone (DILAUDID) tablet 1-2 mg  1-2  mg Oral Q4H PRN Newt Minion, MD      ? oxyCODONE (Oxy IR/ROXICODONE) immediate release tablet 10-15 mg  10-15 mg Oral Q4H PRN Newt Minion, MD   15 mg at 02/23/22 1315  ? Or  ? HYDROmorphone (DILAUDID) tablet 2-3 mg  2-3 mg Oral Q4H PRN Newt Minion, MD      ? insulin aspart (novoLOG) injection 0-6 Units  0-6 Units Subcutaneous TID WC Newt Minion, MD   1 Units at 02/21/22 1652  ? insulin aspart (novoLOG) injection 2 Units  2 Units Subcutaneous TID WC Newt Minion, MD      ?  labetalol (NORMODYNE) injection 10 mg  10 mg Intravenous Q10 min PRN Newt Minion, MD      ? magnesium sulfate IVPB 2 g 50 mL  2 g Intravenous Daily PRN Newt Minion, MD      ? metoCLOPramide (REGLAN) tablet 5 mg  5 mg Oral TID AC Newt Minion, MD   5 mg at 02/24/22 1130  ? metoprolol tartrate (LOPRESSOR) injection 2-5 mg  2-5 mg Intravenous Q2H PRN Newt Minion, MD      ? nutrition supplement (JUVEN) (JUVEN) powder packet 1 packet  1 packet Oral BID BM Newt Minion, MD   1 packet at 02/24/22 1113  ? ondansetron (ZOFRAN) injection 4 mg  4 mg Intravenous Q6H PRN Newt Minion, MD      ? pantoprazole (PROTONIX) EC tablet 40 mg  40 mg Oral Daily Newt Minion, MD   40 mg at 02/24/22 1122  ? phenol (CHLORASEPTIC) mouth spray 1 spray  1 spray Mouth/Throat PRN Newt Minion, MD      ? polyethylene glycol (MIRALAX / GLYCOLAX) packet 17 g  17 g Oral Daily PRN Newt Minion, MD      ? pregabalin (LYRICA) capsule 50 mg  50 mg Oral TID Newt Minion, MD   50 mg at 02/24/22 1122  ? zinc sulfate capsule 220 mg  220 mg Oral Daily Newt Minion, MD   220 mg at 02/24/22 1122  ? ? ? ?Discharge Medications: ?Please see discharge summary for a list of discharge medications. ? ?Relevant Imaging Results: ? ?Relevant Lab Results: ? ? ?Additional Information ?SSN: [id number].  Pt is hemodyalysis pt at [facility]. ? ?Joanne Chars, LCSW ? ? ? ? ?

## 2022-02-25 LAB — BASIC METABOLIC PANEL
Anion gap: 18 — ABNORMAL HIGH (ref 5–15)
BUN: 45 mg/dL — ABNORMAL HIGH (ref 8–23)
CO2: 20 mmol/L — ABNORMAL LOW (ref 22–32)
Calcium: 8.8 mg/dL — ABNORMAL LOW (ref 8.9–10.3)
Chloride: 102 mmol/L (ref 98–111)
Creatinine, Ser: 6.92 mg/dL — ABNORMAL HIGH (ref 0.61–1.24)
GFR, Estimated: 8 mL/min — ABNORMAL LOW (ref >60)
Glucose, Bld: 148 mg/dL — ABNORMAL HIGH (ref 70–99)
Potassium: 4.3 mmol/L (ref 3.5–5.1)
Sodium: 140 mmol/L (ref 135–145)

## 2022-02-25 LAB — GLUCOSE, CAPILLARY
Glucose-Capillary: 130 mg/dL — ABNORMAL HIGH (ref 70–99)
Glucose-Capillary: 133 mg/dL — ABNORMAL HIGH (ref 70–99)
Glucose-Capillary: 134 mg/dL — ABNORMAL HIGH (ref 70–99)
Glucose-Capillary: 155 mg/dL — ABNORMAL HIGH (ref 70–99)

## 2022-02-25 MED ORDER — ACETAMINOPHEN 650 MG RE SUPP
650.0000 mg | Freq: Four times a day (QID) | RECTAL | Status: DC | PRN
  Filled 2022-02-25: qty 1

## 2022-02-25 MED ORDER — DARBEPOETIN ALFA 60 MCG/0.3ML IJ SOSY
60.0000 ug | PREFILLED_SYRINGE | INTRAMUSCULAR | Status: DC
  Filled 2022-02-25: qty 0.3

## 2022-02-25 MED ORDER — CHLORHEXIDINE GLUCONATE CLOTH 2 % EX PADS
6.0000 | MEDICATED_PAD | Freq: Every day | CUTANEOUS | Status: DC
  Administered 2022-02-25 – 2022-02-26 (×2): 6 via TOPICAL

## 2022-02-25 NOTE — Progress Notes (Addendum)
?[facility] ?Progress Note  ? ?Subjective:   Sleeping this AM, opens eyes briefly and shakes head "no" when asked if he is SOB. HD yest-  removed 2000-  tolerated well  ? ?Objective ?Vitals:  ? 02/24/22 1133 02/24/22 2214 02/25/22 0449 02/25/22 0818  ?BP: (!) 150/46 (!) 121/44 (!) 140/38 (!) 139/37  ?Pulse: 82 72 76 79  ?Resp: '20 18 18 16  '$ ?Temp: 98.3 ?F (36.8 ?C) 98.6 ?F (37 ?C) (!) 97.5 ?F (36.4 ?C) 97.8 ?F (36.6 ?C)  ?TempSrc:   Oral Oral  ?SpO2: 98% 98% 99% 100%  ?Weight:      ?Height:      ? ?Physical Exam ?General: Chronically ill appearing male, sleeping, in NAD ?Heart: RRR, 3/6 systolic murmur, no rubs or gallops ?Lungs: CTA bilaterally without wheezing, rhonchi or rales ?Abdomen: Soft, non-distended, +BS ?Extremities: B/l AKA, no significant edema ?Dialysis Access: L IJ TDC ? ?Additional Objective ?Labs: ?Basic Metabolic Panel: ?Recent Labs  ?Lab 03/03/2022 ?0738 02/24/2022 ?0740 02/22/22 ?0123 02/23/22 ?0015 02/24/22 ?0700  ?NA  --    < > 135 135 135  ?K  --    < > 4.2 4.1 4.6  ?CL  --    < > 97* 100 99  ?CO2  --    < > 25 23 21*  ?GLUCOSE  --    < > 172* 102* 78  ?BUN  --    < > 25* 39* 58*  ?CREATININE  --    < > 4.58* 6.15* 8.27*  ?CALCIUM  --    < > 8.2* 8.1* 8.2*  ?PHOS 6.4*  --   --   --  5.9*  ? < > = values in this interval not displayed.  ? ?Liver Function Tests: ?Recent Labs  ?Lab 02/19/2022 ?0738 02/24/22 ?0700  ?ALBUMIN 3.3* 2.8*  ? ?No results for input(s): LIPASE, AMYLASE in the last 168 hours. ?CBC: ?Recent Labs  ?Lab 02/21/22 ?0145 02/22/22 ?0123 02/24/22 ?0700  ?WBC 7.5 10.4 11.4*  ?HGB 11.3* 10.6* 10.0*  ?HCT 35.1* 33.6* 31.2*  ?MCV 92.9 93.9 93.7  ?PLT 172 158 181  ? ?Blood Culture ?   ?Component Value Date/Time  ? SDES BLOOD RIGHT FOREARM 09/03/2018 1630  ? SPECREQUEST  09/03/2018 1630  ?  BOTTLES DRAWN AEROBIC ONLY Blood Culture adequate volume  ? CULT  09/03/2018 1630  ?  NO GROWTH 5 DAYS ?Performed at [facility], [place] [address] [place], [place] [address] ?  ?  REPTSTATUS 09/08/2018 FINAL 09/03/2018 1630  ? ? ?Cardiac Enzymes: ?No results for input(s): CKTOTAL, CKMB, CKMBINDEX, TROPONINI in the last 168 hours. ?CBG: ?Recent Labs  ?Lab 02/24/22 ?0429 02/24/22 ?1132 02/24/22 ?1603 02/24/22 ?2027 02/25/22 ?2703  ?GLUCAP 110* 92 163* 152* 130*  ? ?Iron Studies: No results for input(s): IRON, TIBC, TRANSFERRIN, FERRITIN in the last 72 hours. ?'@lablastinr3'$ @ ?Studies/Results: ?No results found. ?Medications: ? sodium chloride 10 mL/hr at 03/02/2022 0716  ? magnesium sulfate bolus IVPB    ? ? amLODipine  10 mg Oral Daily  ? vitamin C  1,000 mg Oral Daily  ? atorvastatin  40 mg Oral Daily  ? calcitRIOL  0.25 mcg Oral Q T,Th,Sa-HD  ? Chlorhexidine Gluconate Cloth  6 each Topical Q0600  ? docusate sodium  100 mg Oral Daily  ? ferric citrate  420 mg Oral TID WC  ? insulin aspart  0-6 Units Subcutaneous TID WC  ? insulin aspart  2 Units Subcutaneous TID WC  ? metoCLOPramide  5 mg Oral TID AC  ? nutrition supplement (JUVEN)  1 packet Oral BID BM  ? pantoprazole  40 mg Oral Daily  ? pregabalin  50 mg Oral TID  ? zinc sulfate  220 mg Oral Daily  ? ? ?Dialysis Orders: ?TTS [facility] ? 3.5h   400/500  43.3kg  2K/3Ca bath  P2  LIJ TDC ? - rocaltrol 0.25 ug tiw ? - no esa/ Fe ? - last Hb 11.4 on 4/06 ? - last HD 4/06 yest ? 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 ESRD - on HD TTS. Continue on schedule. Next HD 02/26/21.  ?3. HTN/ vol - BP better after HD. Net UF 3L. Continue UF as able. Follow weights. On Norvasc 10 ?4. Hyperkalemia - Resolved with HD.  Getting renal diet. 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  ?MBD ckd - CCa in range, phos slightly elevated. Cont binder, vdra.  ?7. Dementia ?8. DM2 - not on insulin ?9. DNR ?10. Dispo: will need SNF-  possibly in the [place] area  ? ?Anice Paganini, PA-C ?02/25/2022, 8:25 AM  ?[facility] ?Pager: [phone] ? ?Patient seen and examined, agree  with above note with above modifications.  No new issues-  searching for SNF for patent. Continue with routine HD ( due tomorrow)  and appropriate titration of HD related meds  ?Corliss Parish, MD ?02/25/2022 ? ? ? ? ?

## 2022-02-25 NOTE — Plan of Care (Signed)
?  Problem: Education: ?Goal: Knowledge of General Education information will improve ?Description: Including pain rating scale, medication(s)/side effects and non-pharmacologic comfort measures ?Outcome: Not Progressing ?  ?Problem: Health Behavior/Discharge Planning: ?Goal: Ability to manage health-related needs will improve ?Outcome: Not Progressing ?  ?Problem: Clinical Measurements: ?Goal: Ability to maintain clinical measurements within normal limits will improve ?Outcome: Not Progressing ?Goal: Will remain free from infection ?Outcome: Not Progressing ?Goal: Diagnostic test results will improve ?Outcome: Not Progressing ?Goal: Respiratory complications will improve ?Outcome: Not Progressing ?Goal: Cardiovascular complication will be avoided ?Outcome: Not Progressing ?  ?Problem: Activity: ?Goal: Risk for activity intolerance will decrease ?Outcome: Not Progressing ?  ?Problem: Nutrition: ?Goal: Adequate nutrition will be maintained ?Outcome: Not Progressing ?  ?Problem: Coping: ?Goal: Level of anxiety will decrease ?Outcome: Not Progressing ?  ?Problem: Elimination: ?Goal: Will not experience complications related to bowel motility ?Outcome: Not Progressing ?Goal: Will not experience complications related to urinary retention ?Outcome: Not Progressing ?  ?Problem: Pain Managment: ?Goal: General experience of comfort will improve ?Outcome: Not Progressing ?  ?Problem: Safety: ?Goal: Ability to remain free from injury will improve ?Outcome: Not Progressing ?  ?Problem: Skin Integrity: ?Goal: Risk for impaired skin integrity will decrease ?Outcome: Not Progressing ?  ?Problem: Education: ?Goal: Knowledge of the prescribed therapeutic regimen will improve ?Outcome: Not Progressing ?Goal: Ability to verbalize activity precautions or restrictions will improve ?Outcome: Not Progressing ?Goal: Understanding of discharge needs will improve ?Outcome: Not Progressing ?  ?Problem: Activity: ?Goal: Ability to  perform//tolerate increased activity and mobilize with assistive devices will improve ?Outcome: Not Progressing ?  ?Problem: Clinical Measurements: ?Goal: Postoperative complications will be avoided or minimized ?Outcome: Not Progressing ?  ?Problem: Self-Care: ?Goal: Ability to meet self-care needs will improve ?Outcome: Not Progressing ?  ?Problem: Self-Concept: ?Goal: Ability to maintain and perform role responsibilities to the fullest extent possible will improve ?Outcome: Not Progressing ?  ?Problem: Pain Management: ?Goal: Pain level will decrease with appropriate interventions ?Outcome: Not Progressing ?  ?Problem: Skin Integrity: ?Goal: Demonstration of wound healing without infection will improve ?Outcome: Not Progressing ?  ?

## 2022-02-25 NOTE — Progress Notes (Signed)
Dr. Sharol Given called this evening to see if he will be at bedside today to see patient.  ? ?RN informed provider how the patients condition was throughout the whole day. Very somnolent, lethargic, unable to give meds, eat or drink. VSS. Placed on 2L for comfort. Oxygen at 91-92% on room air.  ? ?RN asked what the plan was until SNF bed is found for patient. Possibly doing a palliative consult.  ? ?Per Dr. Sharol Given patient has been like this off and on. Will be very lethargic when going into room and then other times you will see patient awake and eating. Provider saw patient in Dialysis 4/11, and patient was very lethargic as well.  ? ?Verbal orders for BMP and H&H labs placed. RN will call back with any abnormalities.  ? ? ?

## 2022-02-25 NOTE — Progress Notes (Signed)
Patients wife is at bedside. Social worker made aware. Wife given an update on how patient is doing. (Lethargic, unable to give meds, refusing to eat, frequent repositioning moans in pain but going to sleep very easily afterwards).  ?

## 2022-02-25 NOTE — Progress Notes (Signed)
Pt was not responding to pain or voice during assessment. Pt was placed on non-rebreather from 2 L O2. Blanch Media RN charge and Kerrie Pleasure Rapid response were notified via phone. On call doctor was notified via page. Dr Ninfa Linden MD advised monitoring pt. Rapid response nurse came to bedside to provide suctioning and pt is responding to pain now. ?

## 2022-02-25 NOTE — Progress Notes (Addendum)
OT Cancellation Note ? ?Patient Details ?Name: Garrett Salas ?MRN: [id number] ?DOB: [birth year] ? ? ?Cancelled Treatment:    Reason Eval/Treat Not Completed: Fatigue/lethargy limiting ability to participate;Other (comment) (pt very lethargic, resting at this time. will reattempt as schedule allows.) ? ?[name], OTD, OTR/L ?Acute Rehab ?[phone] ? ? ?Kaylyn Lim ?02/25/2022, 1:43 PM ?

## 2022-02-25 NOTE — Progress Notes (Signed)
Advised by CSW that pt has not received any snf offers in [place] and pt will need clinic in [place]. Will assist with clinic placement once snf is determined. Will assist as needed. ? ?Melven Sartorius ?Renal Navigator ?[phone] ?

## 2022-02-25 NOTE — Progress Notes (Signed)
Dr. Sharol Given called to make aware about patients status thus far.  ? ?Patient very lethargic this AM, will wake up and talk to staff when asked questions but will go back to sleep instantly. Unable to give medications or feed so far thus shift. VSS. BGM stable. Drsg intact.  ? ?Per Dr. Sharol Given, patient is most likely declining. Hold all narcotics. RN can call family to let them know. RN informed provider that social worker has not been able to get a hold of family. Still trying to find him SNF placement.  ? ?Per Dr. Sharol Given he will be at bedside later today to see patient.  ?

## 2022-02-25 NOTE — Plan of Care (Signed)
  Problem: Clinical Measurements: Goal: Ability to maintain clinical measurements within normal limits will improve Outcome: Progressing Goal: Will remain free from infection Outcome: Progressing Goal: Diagnostic test results will improve Outcome: Progressing Goal: Respiratory complications will improve Outcome: Progressing Goal: Cardiovascular complication will be avoided Outcome: Progressing   Problem: Coping: Goal: Level of anxiety will decrease Outcome: Progressing   Problem: Elimination: Goal: Will not experience complications related to bowel motility Outcome: Progressing Goal: Will not experience complications related to urinary retention Outcome: Progressing   Problem: Pain Managment: Goal: General experience of comfort will improve Outcome: Progressing   Problem: Safety: Goal: Ability to remain free from injury will improve Outcome: Progressing   Problem: Skin Integrity: Goal: Risk for impaired skin integrity will decrease Outcome: Progressing   

## 2022-02-25 NOTE — TOC Progression Note (Addendum)
Transition of Care (TOC) - Progression Note  ? ? ?Patient Details  ?Name: Garrett Salas [facility] ?MRN: [id number] ?Date of Birth: May 26, 1951 ? ?Transition of Care (TOC) CM/SW Contact  ?Joanne Chars, LCSW ?Phone Number: ?02/25/2022, 9:53 AM ? ?Clinical Narrative:   Several bed offers, none in [place].  CSW attempted to reach wife by phone, again no answer, unable to leave message.  CSW attempted to speak with pt who does not open eyes today or engage.   ? ?37: CSW spoke with pt wife Garrett Salas in pt room.  She works at [employer] in [place], said she does not have cell phone but [place] can call her at work: [phone].  Discussed recommendation for SNF and she is in agreement, bed offers given and she would like to choose [facility].  Waiting on response from [facility] about auth and HD facility needed.  ? ?1700: [facility], will initiate insurance auth.  They do need HD to be MWF, cannot to TTS.  No preference on which HD facility.  Tr[place]/renal navigator informed.   ? ?Expected Discharge Plan: [place] ?Barriers to Discharge: SNF Pending bed offer ? ?Expected Discharge Plan and Services ?Expected Discharge Plan: [place] ?In-house Referral: Clinical Social Work ?  ?Post Acute Care Choice: [place] ?Living arrangements for the past 2 months: [place] ?Expected Discharge Date: 02/24/22               ?  ?  ?  ?  ?  ?  ?  ?  ?  ?  ? ? ?Social Determinants of Health (SDOH) Interventions ?  ? ?Readmission Risk Interventions ?   ? View : No data to display.  ?  ?  ?  ? ? ?

## 2022-02-25 NOTE — Progress Notes (Signed)
?   02/25/22 1953  ?Assess: MEWS Score  ?Temp (!) 102.6 ?F (39.2 ?C)  ?BP (!) 136/50  ?Pulse Rate (!) 107  ?Resp 15  ?Level of Consciousness Unresponsive  ?Assess: MEWS Score  ?MEWS Temp 2  ?MEWS Systolic 0  ?MEWS Pulse 1  ?MEWS RR 0  ?MEWS LOC 3  ?MEWS Score 6  ?MEWS Score Color Red  ?Assess: if the MEWS score is Yellow or Red  ?Were vital signs taken at a resting state? Yes  ?Focused Assessment Change from prior assessment (see assessment flowsheet)  ?Early Detection of Sepsis Score *See Row Information* Low  ?MEWS guidelines implemented *See Row Information* Yes  ?Treat  ?MEWS Interventions Other (Comment) ?(rapid called)  ?Pain Scale Faces  ?Pain Score 0  ?Take Vital Signs  ?Increase Vital Sign Frequency  Red: Q 1hr X 4 then Q 4hr X 4, if remains red, continue Q 4hrs  ?Escalate  ?MEWS: Escalate Red: discuss with charge nurse/RN and provider, consider discussing with RRT  ?Notify: Charge Nurse/RN  ?Name of Charge Nurse/RN Notified Blanch Media RN  ?Date Charge Nurse/RN Notified 02/25/22  ?Time Charge Nurse/RN Notified 2000  ?Notify: Provider  ?Provider Name/Title Dr Ninfa Linden MD  ?Date Provider Notified 02/25/22  ?Time Provider Notified 1955  ?Notification Type Page  ?Notification Reason Change in status  ?Provider response No new orders;Other (Comment) ?(Observe pt)  ?Date of Provider Response 02/25/22  ?Time of Provider Response 2000  ?Notify: Rapid Response  ?Name of Rapid Response RN Notified Shanon Brow RN  ?Date Rapid Response Notified 02/25/22  ?Time Rapid Response Notified 1954  ?Document  ?Patient Outcome Stabilized after interventions  ?Progress note created (see row info) Yes  ? ?Patient was red mews,MEWS protocols followed ( see flowsheets) ?

## 2022-02-25 NOTE — Plan of Care (Signed)
?  Problem: Education: ?Goal: Knowledge of General Education information will improve ?Description: Including pain rating scale, medication(s)/side effects and non-pharmacologic comfort measures ?02/25/2022 2146 by Guinevere Scarlet, RN ?Outcome: Not Progressing ?02/25/2022 2146 by Guinevere Scarlet, RN ?Outcome: Progressing ?  ?Problem: Health Behavior/Discharge Planning: ?Goal: Ability to manage health-related needs will improve ?02/25/2022 2146 by Guinevere Scarlet, RN ?Outcome: Not Progressing ?02/25/2022 2146 by Guinevere Scarlet, RN ?Outcome: Progressing ? Patient is weak and asleep ?

## 2022-02-26 ENCOUNTER — Inpatient Hospital Stay (HOSPITAL_COMMUNITY): Payer: Medicare (Managed Care)

## 2022-02-26 LAB — HEMOGLOBIN AND HEMATOCRIT, BLOOD
HCT: 37.3 % — ABNORMAL LOW (ref 39.0–52.0)
Hemoglobin: 12 g/dL — ABNORMAL LOW (ref 13.0–17.0)

## 2022-02-26 LAB — IRON AND TIBC
Iron: 17 ug/dL — ABNORMAL LOW (ref 45–182)
Saturation Ratios: 9 % — ABNORMAL LOW (ref 17.9–39.5)
TIBC: 199 ug/dL — ABNORMAL LOW (ref 250–450)
UIBC: 182 ug/dL

## 2022-02-26 LAB — GLUCOSE, CAPILLARY
Glucose-Capillary: 121 mg/dL — ABNORMAL HIGH (ref 70–99)
Glucose-Capillary: 118 mg/dL — ABNORMAL HIGH (ref 70–99)
Glucose-Capillary: 99 mg/dL (ref 70–99)

## 2022-02-26 LAB — HEPATITIS B SURFACE ANTIGEN: Hepatitis B Surface Ag: NONREACTIVE

## 2022-02-26 LAB — FERRITIN: Ferritin: 1490 ng/mL — ABNORMAL HIGH (ref 24–336)

## 2022-02-26 MED ORDER — ALTEPLASE 2 MG IJ SOLR
2.0000 mg | Freq: Once | INTRAMUSCULAR | Status: AC
  Administered 2022-02-26: 2 mg

## 2022-02-26 MED ORDER — HEPARIN SODIUM (PORCINE) 1000 UNIT/ML IJ SOLN
INTRAMUSCULAR | Status: AC
  Filled 2022-02-26: qty 4

## 2022-02-26 MED ORDER — PREGABALIN 50 MG PO CAPS: 50.0000 mg | ORAL_CAPSULE | Freq: Two times a day (BID) | ORAL | Status: DC | PRN

## 2022-02-26 MED ORDER — PREGABALIN 50 MG PO CAPS: 50.0000 mg | ORAL_CAPSULE | Freq: Every day | ORAL | Status: DC

## 2022-02-27 LAB — HEPATITIS B SURFACE ANTIBODY, QUANTITATIVE: Hep B S AB Quant (Post): 180.4 m[IU]/mL (ref >9.9)

## 2022-03-02 ENCOUNTER — Telehealth: Payer: Self-pay | Admitting: Orthopedic Surgery

## 2022-03-02 NOTE — Telephone Encounter (Signed)
Vangie Bicker from [place] called about Dr. Sharol Given signing death certificate. She states Dr. Sharol Given needs to register on [place] ( physicians public health records). Please call [name] back at [phone]. ?

## 2022-03-02 NOTE — Telephone Encounter (Signed)
Message sent to admin to see if there are log on credentials for the [place] web site or if we need to register Dr. Sharol Given as a new user.  ?

## 2022-03-02 NOTE — Telephone Encounter (Signed)
Enrollment complete message states will be in contact with the office as soon as the application has been processed.  ?

## 2022-03-03 ENCOUNTER — Telehealth: Payer: Self-pay | Admitting: Orthopedic Surgery

## 2022-03-03 NOTE — Telephone Encounter (Signed)
You will get an email tomorrow to notify you that the certificate is ready to sign. The log on I used was [email or web address] your user name is [phone] and the pass word is [phone]! Once you select cause of death and sign you must click the box to "affirm" and this will push the document to the health dept.  ?

## 2022-03-03 NOTE — Telephone Encounter (Signed)
[name] from [employer] called to inform Garrett Salas. We are list on website as Garrett Salas. No call back needed. ?

## 2022-03-06 ENCOUNTER — Ambulatory Visit: Payer: Self-pay | Admitting: *Deleted

## 2022-03-16 NOTE — Progress Notes (Signed)
Patient received back from dialysis in an altered state, due to lack of responsiveness. Checked the patients vitals the blood pressure was 151/129 with a MAP of 138 and heart rate of 122. Notified rapid response and MD. At 1802 patient noted without heart sounds MD made aware, rapid response in room  ?

## 2022-03-16 NOTE — Progress Notes (Signed)
Contacted by CSW that pt will be going to [facility] in [place] at d/c. Referral made to [facility] admissions this morning for clinic change. Admissions advised that snf is requesting a MWF. Update provided to CSW. Will assist as needed.  ? ?Melven Sartorius ?Renal Navigator ?[phone]  ?

## 2022-03-16 NOTE — Progress Notes (Signed)
Occupational Therapy Treatment ?Patient Details ?Name: Garrett Salas [facility] ?MRN: [id number] ?DOB: Jun 09, 1951 ?Today's Date: 2022-03-27 ? 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  ?OT comments ? Focus of session on bed mobility and sitting balance at EOB. Pt with intermittent lethargy with eyes closed requiring almost constant verbal cues to maintain alert state. +2 max to total assist needed for bed level mobility and mod to brief periods of min guard assist for sitting balance. Pt took sips from straw with min assist to stabilize cup. Pt continues to be appropriate for SNF level rehab.   ? ?Recommendations for follow up therapy are one component of a multi-disciplinary discharge planning process, led by the attending physician.  Recommendations may be updated based on patient status, additional functional criteria and insurance authorization. ?   ?Follow Up Recommendations ? Skilled nursing-short term rehab (<3 hours/day)  ?  ?Assistance Recommended at Discharge Frequent or constant Supervision/Assistance  ?Patient can return home with the following ? A lot of help with walking and/or transfers;A lot of help with bathing/dressing/bathroom;Assistance with cooking/housework;Direct supervision/assist for medications management;Direct supervision/assist for financial management;Assist for transportation;Help with stairs or ramp for entrance ?  ?Equipment Recommendations ? Hospital bed;Wheelchair (measurements OT);Wheelchair cushion (measurements OT)  ?  ?Recommendations for Other Services   ? ?  ?Precautions / Restrictions Precautions ?Precautions: Fall ?Precaution Comments: B wound VACs ?Restrictions ?Weight Bearing Restrictions: Yes ?RLE Weight Bearing: Non weight bearing ?LLE Weight Bearing: Non weight bearing  ? ? ?  ? ?Mobility Bed Mobility ?Overal bed mobility:  Needs Assistance ?Bed Mobility: Supine to Sit, Sit to Supine, Rolling ?Rolling: Mod assist ?  ?Supine to sit: Max assist, +2 for physical assistance, +2 for safety/equipment ?Sit to supine: Total assist, +2 for physical assistance, +2 for safety/equipment ?  ?General bed mobility comments: pt required max Ax2 for trunk elevation and scooting hips to EOB. Pt intermittently closing eyes and fatiguing quickly with trunk and head control in sitting. Pt trunk supported posteriorly by SPT and BUE supported by OT. Pt able to push/pull with UE in sitting with very poor trunk control. Pt presented with R and lateral lean in sitting requiring max cues to correct. Pt able to correct for brief periods and fatigued quickly. Pt able to lean laterally and support himself on his elbow bilaterally with mod A for trunk and max cues.  Pt able to perform anterior pelvic tilt and erect sitting posture x4 with cues for ~5 seconds at a time. Pt did not require trunk support during this exercise. Pt fatigued quickly during activity. Further mobility deferred due to lethargy and pt scheduled for dialysis this afternoon. ?  ? ?Transfers ?Overall transfer level: Needs assistance ?  ?  ?  ?  ?  ?  ?  ?  ?  ?  ?  ?Balance Overall balance assessment: Needs assistance ?  ?Sitting balance-Leahy Scale: Zero ?Sitting balance - Comments: pt requiring max Ax2 for sitting at EOB with BUE supported by OT and trunk supported posteriorly and laterally by SPT. ?Postural control: Right lateral lean, Posterior lean ?Standing balance support: Bilateral upper extremity supported ?  ?  ?  ?  ?  ?  ?  ?  ?  ?  ?  ?  ?  ?   ? ?ADL either performed or assessed with clinical  judgement  ? ?ADL   ?Eating/Feeding: Minimal assistance;Sitting ?Eating/Feeding Details (indicate cue type and reason): to drink from cup with straw ?  ?  ?  ?  ?  ?  ?  ?  ?  ?  ?  ?  ?  ?  ?  ?  ?  ?  ?  ? ?Extremity/Trunk Assessment   ?  ?  ?  ?  ?  ? ?Vision   ?  ?  ?Perception   ?  ?Praxis    ?  ? ?Cognition Arousal/Alertness: Lethargic ?Behavior During Therapy: [facility] for tasks assessed/performed ?Overall Cognitive Status: No family/caregiver present to determine baseline cognitive functioning ?  ?  ?  ?  ?  ?  ?  ?  ?  ?  ?  ?  ?  ?  ?  ?  ?General Comments: Pt rouses when therapist speaks to him. Able to speak short phrases in a raspy whisper intermittently when questioned. Still moving slow and closing eyes often during mobility. ?  ?  ?   ?Exercises   ? ?  ?Shoulder Instructions   ? ? ?  ?General Comments bilateral residual limbs with wound vac attached  ? ? ?Pertinent Vitals/ Pain       Pain Assessment ?Pain Assessment: Faces ?Faces Pain Scale: Hurts little more ?Pain Location: abdomen ?Pain Descriptors / Indicators: Discomfort ?Pain Intervention(s): Monitored during session, Repositioned ? ?Home Living   ?  ?  ?  ?  ?  ?  ?  ?  ?  ?  ?  ?  ?  ?  ?  ?  ?  ?  ? ?  ?Prior Functioning/Environment    ?  ?  ?  ?   ? ?Frequency ? Min 2X/week  ? ? ? ? ?  ?Progress Toward Goals ? ?OT Goals(current goals can now be found in the care plan section) ? Progress towards OT goals: Progressing toward goals ? ?Acute Rehab OT Goals ?OT Goal Formulation: With patient ?Time For Goal Achievement: 03/07/22 ?Potential to Achieve Goals: Fair  ?Plan Discharge plan remains appropriate   ? ?Co-evaluation ? ? ?   ?Reason for Co-Treatment: Complexity of the patient's impairments (multi-system involvement);For patient/therapist safety;To address functional/ADL transfers ?PT goals addressed during session: Mobility/safety with mobility;Balance ?  ?  ? ?  ?AM-PAC OT "6 Clicks" Daily Activity     ?Outcome Measure ? ? Help from another person eating meals?: A Lot ?Help from another person taking care of personal grooming?: A Lot ?Help from another person toileting, which includes using toliet, bedpan, or urinal?: Total ?Help from another person bathing (including washing, rinsing, drying)?: Total ?Help from another person to put on  and taking off regular upper body clothing?: Total ?Help from another person to put on and taking off regular lower body clothing?: Total ?6 Click Score: 8 ? ?  ?End of Session Equipment Utilized During Treatment: Oxygen ? ?OT Visit Diagnosis: Muscle weakness (generalized) (M62.81) ?  ?Activity Tolerance Patient tolerated treatment well ?  ?Patient Left in bed;with call bell/phone within reach;with bed alarm set ?  ?Nurse Communication   ?  ? ?   ? ?Time: 9892-1194 ?OT Time Calculation (min): 23 min ? ?Charges: OT General Charges ?$OT Visit: 1 Visit ?OT Treatments ?$Therapeutic Activity: 8-22 mins ? ?Nestor Lewandowsky, OTR/L ?Acute Rehabilitation Services ?Pager: [phone] ?Office: [phone]  ?[name] ?Mar 17, 2022, 2:13 PM ? ? ?

## 2022-03-16 NOTE — Discharge Summary (Signed)
Patient is a 71 year old gentleman who presented with gangrenous bilateral below-knee amputations.  Due to uncontrolled pain patient wished to proceed with bilateral above-the-knee amputations.  Patient underwent bilateral above-the-knee amputations on 03/04/2022.  Postoperatively patient had intermittent episodes of being alert and oriented and eating well being difficult to arouse.  Patient was progressing for discharge to skilled nursing.  He underwent a speech evaluation for swallowing and diet was adjusted.  Patient underwent dialysis 3 times a day.  On 2022-03-05 patient returned from dialysis.  Patient was alert in the morning prior to dialysis.  Upon returning from dialysis patient did not respond to pain pupils were pinpoint and not reactive.  Dr. Tacy Learn with CCM came to bedside.  Patient had a weak femoral pulse.  Patient was connected to a monitor and was in V. tach without a pulse.  Due to the patient's DNR status he was not shocked.  His CBG was 99 O2 sats were above 90% on room air.  Patient then proceeded to asystole and the time of death was 1802 on 03-05-2022.  I notified immediate family. ?

## 2022-03-16 NOTE — Progress Notes (Signed)
Physical Therapy Treatment ?Patient Details ?Name: Garrett Salas [facility] ?MRN: [id number] ?DOB: June 12, 1951 ?Today's Date: 03/17/22 ? ? 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 ?  ?PT Comments  ? ? Patient is progressing towards goals with skilled PT interventions performed today including postural control/strengthening and sitting balance. Patient required max x2 to total A for bed mobility. Pt was still lethargic during session, however, he was more consistent with responding to cues today requiring increased time. He fatigued quickly with mobility tasks and could tolerate brief periods of active trunk control with postural training x4 reps. Pt will benefit from continued skilled PT to maximize pt's safety and independence with functional mobility.  ?   ?Recommendations for follow up therapy are one component of a multi-disciplinary discharge planning process, led by the attending physician.  Recommendations may be updated based on patient status, additional functional criteria and insurance authorization. ? ?Follow Up Recommendations ? Skilled nursing-short term rehab (<3 hours/day) ?  ?  ?Assistance Recommended at Discharge Frequent or constant Supervision/Assistance  ?Patient can return home with the following Two people to help with walking and/or transfers;Two people to help with bathing/dressing/bathroom;Assistance with cooking/housework;Assistance with feeding;Direct supervision/assist for medications management;Direct supervision/assist for financial management;Assist for transportation;Help with stairs or ramp for entrance ?  ?Equipment Recommendations ? Other (comment) (TBD by next venue of care)  ?  ?Recommendations for Other Services   ? ? ?  ?Precautions / Restrictions Precautions ?Precautions: Fall ?Precaution Comments: B wound  VAC's ?Restrictions ?Weight Bearing Restrictions: Yes ?RLE Weight Bearing: Non weight bearing ?LLE Weight Bearing: Non weight bearing  ?  ? ?Mobility ? Bed Mobility ?Overal bed mobility: Needs Assistance ?Bed Mobility: Supine to Sit, Sit to Supine, Rolling ?Rolling: Mod assist ?  ?Supine to sit: Max assist, +2 for physical assistance, +2 for safety/equipment ?Sit to supine: Total assist, +2 for physical assistance, +2 for safety/equipment ?  ?General bed mobility comments: pt required max Ax2 for trunk elevation and scooting hips to EOB. Pt intermittently closing eyes and fatiguing quickly with trunk and head control in sitting. Pt trunk supported posteriorly by SPT and BUE supported by OT. Pt able to push/pull with UE in sitting with very poor trunk control. Pt presented with R and lateral lean in sitting requiring max cues to correct. Pt able to correct for brief periods and fatigued quickly. Pt able to lean laterally and support himself on his elbow bilaterally with mod A for trunk and max cues.  Pt able to perform anterior pelvic tilt and erect sitting posture x4 with cues for ~5 seconds at a time. Pt did not require trunk support during this exercise. Pt fatigued quickly during activity. Further mobility deferred due to lethargy and pt scheduled for dialysis this afternoon. ?  ? ?Transfers ?  ?  ?  ?  ?  ?  ?  ?  ?  ?  ?  ? ?Ambulation/Gait ?  ?  ?  ?  ?  ?  ?  ?  ? ? ?Stairs ?  ?  ?  ?  ?  ? ? ?Wheelchair Mobility ?  ? ?Modified Rankin (Stroke Patients Only) ?  ? ? ?  ?Balance Overall balance assessment: Needs assistance ?Sitting-balance support: Bilateral upper extremity supported ?Sitting balance-Leahy Scale: Zero ?Sitting balance - Comments: pt requiring max Ax2 for  sitting at EOB with BUE supported by OT and trunk supported posteriorly and laterally by SPT. ?Postural control: Right lateral lean, Posterior lean ?  ?  ?  ?  ?  ?  ?  ?  ?  ?  ?  ?  ?  ?  ?  ? ?  ?Cognition Arousal/Alertness:  Lethargic ?Behavior During Therapy: [facility] for tasks assessed/performed ?Overall Cognitive Status: No family/caregiver present to determine baseline cognitive functioning ?  ?  ?  ?  ?  ?  ?  ?  ?  ?  ?  ?  ?  ?  ?  ?  ?General Comments: Pt rouses when therapist speaks to him. Able to speak short phrases in a raspy whisper intermittently when questioned. Still moving slow and closing eyes often during mobility. ?  ?  ? ?  ?Exercises Other Exercises ?Other Exercises: 4x postural control for ~5 seconds each rep ? ?  ?General Comments General comments (skin integrity, edema, etc.): bilateral residual limbs with wound vac attached ?  ?  ? ?Pertinent Vitals/Pain Pain Assessment ?Pain Assessment: Faces ?Faces Pain Scale: Hurts even more ?Pain Location: abdomen ?Pain Descriptors / Indicators: Discomfort, Grimacing, Aching ?Pain Intervention(s): Monitored during session  ? ? ?Home Living   ?  ?  ?  ?  ?  ?  ?  ?  ?  ?   ?  ?Prior Function    ?  ?  ?   ? ?PT Goals (current goals can now be found in the care plan section) Acute Rehab PT Goals ?Patient Stated Goal: none steady ?PT Goal Formulation: Patient unable to participate in goal setting ?Time For Goal Achievement: 03/09/22 ?Potential to Achieve Goals: Fair ?Progress towards PT goals: Progressing toward goals ? ?  ?Frequency ? ? ? Min 2X/week ? ? ? ?  ?PT Plan Current plan remains appropriate  ? ? ?Co-evaluation PT/OT/SLP Co-Evaluation/Treatment: Yes ?Reason for Co-Treatment: Complexity of the patient's impairments (multi-system involvement);For patient/therapist safety;To address functional/ADL transfers ?PT goals addressed during session: Mobility/safety with mobility;Balance ?  ?  ? ?  ?AM-PAC PT "6 Clicks" Mobility   ?Outcome Measure ? Help needed turning from your back to your side while in a flat bed without using bedrails?: Total ?Help needed moving from lying on your back to sitting on the side of a flat bed without using bedrails?: Total ?Help needed moving to and  from a bed to a chair (including a wheelchair)?: Total ?Help needed standing up from a chair using your arms (e.g., wheelchair or bedside chair)?: Total ?Help needed to walk in hospital room?: Total ?Help needed climbing 3-5 steps with a railing? : Total ?6 Click Score: 6 ? ?  ?End of Session   ?Activity Tolerance: Patient limited by lethargy;Patient limited by fatigue ?Patient left: in bed;with call bell/phone within reach;with nursing/sitter in room ?Nurse Communication: Mobility status ?PT Visit Diagnosis: Other abnormalities of gait and mobility (R26.89);Muscle weakness (generalized) (M62.81) ?  ? ? ?Time: 7939-0300 ?PT Time Calculation (min) (ACUTE ONLY): 23 min ? ?Charges:  $Therapeutic Activity: 8-22 mins          ?          ? ?Jonne Ply, SPT ? ?Jonne Ply ?March 03, 2022, 1:32 PM ? ?

## 2022-03-16 NOTE — Progress Notes (Addendum)
?[facility] ?Progress Note  ? ?Subjective:   Noted pt was weak and poorly responsive yesterday. Taken to HD today but his catheter is not running, RN installing cathflo dwell and will attempt HD later today. Pt is alert today, complains of pain in his feet and his lungs. Reports his lungs hurt but he is not short of breath. Denies chest pain, palpitations, dizziness. Tells me that he is constipated ? ?Objective ?Vitals:  ? 03-06-22 0320 Mar 06, 2022 0700 2022-03-06 0738 03/06/2022 0755  ?BP:  (!) 135/45 (!) 108/42 (!) 127/49  ?Pulse: 74  70 74  ?Resp: 18     ?Temp: (!) 97.1 ?F (36.2 ?C)  97.7 ?F (36.5 ?C)   ?TempSrc: Axillary  Axillary   ?SpO2: 99%     ?Weight:   37.2 kg   ?Height:      ? ?Physical Exam ?General: Chronically ill appearing male, alert -  seems uncomfortable in the bed ?Heart: RRR, 2/6 systolic murmur ?Lungs: CTA bilaterally without wheezing, rhonchi or rales ?Abdomen: Soft, non-tender, non-distended, +BS ?Extremities: B/l BKA, no significant edema ?Dialysis Access:  L IJ TDC ? ?Additional Objective ?Labs: ?Basic Metabolic Panel: ?Recent Labs  ?Lab 03/10/2022 ?0738 03/10/2022 ?0740 02/23/22 ?0015 02/24/22 ?0700 02/25/22 ?2005  ?NA  --    < > 135 135 140  ?K  --    < > 4.1 4.6 4.3  ?CL  --    < > 100 99 102  ?CO2  --    < > 23 21* 20*  ?GLUCOSE  --    < > 102* 78 148*  ?BUN  --    < > 39* 58* 45*  ?CREATININE  --    < > 6.15* 8.27* 6.92*  ?CALCIUM  --    < > 8.1* 8.2* 8.8*  ?PHOS 6.4*  --   --  5.9*  --   ? < > = values in this interval not displayed.  ? ?Liver Function Tests: ?Recent Labs  ?Lab 02/19/2022 ?0738 02/24/22 ?0700  ?ALBUMIN 3.3* 2.8*  ? ?No results for input(s): LIPASE, AMYLASE in the last 168 hours. ?CBC: ?Recent Labs  ?Lab 02/21/22 ?0145 02/22/22 ?0123 02/24/22 ?0700 2022-03-06 ?0041  ?WBC 7.5 10.4 11.4*  --   ?HGB 11.3* 10.6* 10.0* 12.0*  ?HCT 35.1* 33.6* 31.2* 37.3*  ?MCV 92.9 93.9 93.7  --   ?PLT 172 158 181  --   ? ?Blood Culture ?   ?Component Value Date/Time  ? SDES BLOOD RIGHT  FOREARM 09/03/2018 1630  ? SPECREQUEST  09/03/2018 1630  ?  BOTTLES DRAWN AEROBIC ONLY Blood Culture adequate volume  ? CULT  09/03/2018 1630  ?  NO GROWTH 5 DAYS ?Performed at [facility], [place] [address] [place],  [address] ?  ? REPTSTATUS 09/08/2018 FINAL 09/03/2018 1630  ? ? ?Cardiac Enzymes: ?No results for input(s): CKTOTAL, CKMB, CKMBINDEX, TROPONINI in the last 168 hours. ?CBG: ?Recent Labs  ?Lab 02/24/22 ?2027 02/25/22 ?2330 02/25/22 ?1106 02/25/22 ?1634 02/25/22 ?2041  ?GLUCAP 152* 130* 133* 134* 155*  ? ?Iron Studies:  ?Recent Labs  ?  03/06/2022 ?0041  ?IRON 17*  ?TIBC 199*  ?FERRITIN 1,490*  ? ?'@lablastinr3'$ @ ?Studies/Results: ?No results found. ?Medications: ? sodium chloride 10 mL/hr at 03/05/2022 0716  ? magnesium sulfate bolus IVPB    ? ? alteplase  2 mg Intracatheter Once  ? alteplase  2 mg Intracatheter Once  ? amLODipine  10 mg Oral Daily  ? vitamin C  1,000 mg Oral  Daily  ? atorvastatin  40 mg Oral Daily  ? calcitRIOL  0.25 mcg Oral Q T,Th,Sa-HD  ? Chlorhexidine Gluconate Cloth  6 each Topical Q0600  ? Chlorhexidine Gluconate Cloth  6 each Topical Q0600  ? darbepoetin (ARANESP) injection - DIALYSIS  60 mcg Intravenous Q Thu-HD  ? docusate sodium  100 mg Oral Daily  ? ferric citrate  420 mg Oral TID WC  ? insulin aspart  0-6 Units Subcutaneous TID WC  ? insulin aspart  2 Units Subcutaneous TID WC  ? metoCLOPramide  5 mg Oral TID AC  ? nutrition supplement (JUVEN)  1 packet Oral BID BM  ? pantoprazole  40 mg Oral Daily  ? pregabalin  50 mg Oral TID  ? zinc sulfate  220 mg Oral Daily  ? ? ?Dialysis Orders: ?TTS [facility] ? 3.5h   400/500  43.3kg  2K/3Ca bath  P2  LIJ TDC ? - rocaltrol 0.25 ug tiw ? - no esa/ Fe ? - last Hb 11.4 on 4/06 ? - last HD 4/06 yest ? 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 ESRD - on HD TTS. Due for dialysis today but catheter is not running, trying cathflo dwell. If catheter is still not functional this afternoon, will need a TDC exchange.   ?3. HTN/ vol - BP improved, no volume overload on exam but reports his lungs hurt, may have some excess fluid. Will monitor for improvement post HD.  ?4. Hyperkalemia - Resolved with HD.  Getting renal diet. 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 Noted low tsat but high ferritin and Hgb is 12, so will not start IV Fe at this time.  ?MBD ckd - CCa in range, phos slightly elevated. Cont binder, vdra.  ?7. Dementia ?8. DM2 - not on insulin ?9. DNR ?10. Dispo: will need SNF-  possibly in the [place] area  ?  ? ?Anice Paganini, PA-C ?03/04/22, 8:52 AM  ?[facility] ?Pager: [phone] ? ?Patient seen and examined, agree with above note with above modifications. Chronically ill male- ESRD s/p BKA- AKA revision-  attempted HD today but TDC was poorly functional-  cathflo in place and for re attempt later-  titration of HD related meds  ?Corliss Parish, MD ?04-Mar-2022 ? ? ? ? ?

## 2022-03-16 NOTE — Progress Notes (Signed)
Patient ID: Garrett Salas, male   DOB: Aug 23, 1951, 71 y.o.   MRN: [id number] ?Patient is seen in follow-up status post bilateral above-knee amputations.  On Tuesday patient was sitting up eating in dialysis and had no complaints.  Yesterday patient was less responsive did not eat or drink.  This morning patient responds appropriately answers questions he states he feels weak.  Discussed the importance of eating and drinking.  Patient is on his way to dialysis this morning.  Patient's electrolytes were stable last night no elevated potassium.  His BUN is 46 creatinine 6.92.  Patient's hemoglobin is stable at 12.0.  Patient's O2 sats on room air were 91% yesterday he was placed on nasal cannula oxygen.  Awaiting skilled nursing placement. ?

## 2022-03-16 NOTE — Significant Event (Signed)
Rapid Response Event Progress Note  ? ?Reason for Call : Unresponsive ? ?Pt had just returned from HD. He was alert prior to session. Pt now does not respond to pain. Pupils are pin point and non reactive.  PT IS A DNR. Dr. Sharol Given notified and orders given to reach out to hospitalist. Dr. Tacy Learn with CCM came to bedside. Pt had a very weak femoral pulse. Once hooked onto monitor, pt was in [facility] without a pulse, but due to DNR status he was not shocked. CBG 99. Pt then went asystole. Time of death 72 confirmed by ultrasound performed by Dr. Tacy Learn. ? ?Dr. Sharol Given notified and will make family aware. ? ? ?Event Summary:  ? ?MD Notified: Dr. Duda/Dr. Tacy Learn ?Call Time: 1724 ?Arrival Time: 6063 ?End Time: 0160 ? ?Fulton Reek, RN ?

## 2022-03-16 NOTE — Evaluation (Signed)
Clinical/Bedside Swallow Evaluation ?Patient Details  ?Name: Garrett Salas ?MRN: [id number] ?Date of Birth: 07/10/1951 ? ?Today's Date: 26-Mar-2022 ?Time: SLP Start Time (ACUTE ONLY): 5852 SLP Stop Time (ACUTE ONLY): 1217 ?SLP Time Calculation (min) (ACUTE ONLY): 23 min ? ?Past Medical History:  ?Past Medical History:  ?Diagnosis Date  ? Complication of anesthesia 2016  ? Mrs. Campoli reporte dthat patient had too much medication and had to be given Narcan. Mr. Xu was takingh pain medication also.  ? COPD (chronic obstructive pulmonary disease) ([place])   ? Dementia ([place])   ? 02/19/22 Mrs. Overley said patient has some dementia.  ? Depression   ? Diabetes mellitus   ? Type 2. Diagnosed in mid 31s. Currently insulin requiring  ? Diabetic neuropathy ([place])   ? Diabetic retinopathy   ? legally blind  ? Dialysis patient [facility])   ? Dyspnea   ? ESRD (end stage renal disease) on dialysis [facility])   ? due to diabetic nephropathy. T/Th/Sat  ? Gangrene ([place])   ? right index  ? History of blood transfusion   ? Hx of right BKA ([place])   ? Hyperlipidemia   ? Hypertension   ? Peripheral vascular disease ([place])   ? Substance abuse ([place])   ? Tobacco abuse   ? Wears dentures   ? ?Past Surgical History:  ?Past Surgical History:  ?Procedure Laterality Date  ? A/V FISTULAGRAM N/A 05/22/2019  ? Procedure: A/V FISTULAGRAM - Right Arm;  Surgeon: Waynetta Sandy, MD;  Location: [place] CV LAB;  Service: Cardiovascular;  Laterality: N/A;  ? ABDOMINAL AORTAGRAM N/A 01/07/2015  ? Procedure: ABDOMINAL AORTAGRAM;  Surgeon: Conrad [place], MD;  Location: [facility] CATH LAB;  Service: Cardiovascular;  Laterality: N/A;  ? AMPUTATION Right 01/09/2015  ? Procedure: AMPUTATION BELOW KNEE RIGHT LEG;  Surgeon: Conrad [place], MD;  Location: [place];  Service: Vascular;  Laterality: Right;  ? AMPUTATION Left 05/16/2015  ? Procedure: LEFT BELOW THE KNEE AMPUTATION;  Surgeon: Conrad Alda, MD;  Location: [place];  Service: Vascular;  Laterality: Left;  ? AMPUTATION Right  06/12/2019  ? Procedure: AMPUTATION RAY RIGHT INDEX FINGER;  Surgeon: Leanora Cover, MD;  Location: [place];  Service: Orthopedics;  Laterality: Right;  ? AMPUTATION Right 06/26/2019  ? Procedure: RIGHT SMALL FINGER AMPUTATION;  Surgeon: Leanora Cover, MD;  Location: [place];  Service: Orthopedics;  Laterality: Right;  ? AMPUTATION Bilateral 03/09/2022  ? Procedure: BILATERAL ABOVE KNEE AMPUATION;  Surgeon: Newt Minion, MD;  Location: [place];  Service: Orthopedics;  Laterality: Bilateral;  ? AV FISTULA PLACEMENT    ? CARDIAC CATHETERIZATION  05/06/2011  ? no significant CAD, EF 55-60%, LVEDP :15   ? INSERTION OF DIALYSIS CATHETER N/A 05/24/2019  ? Procedure: INSERTION OF DIALYSIS CATHETER;  Surgeon: Waynetta Sandy, MD;  Location: [place];  Service: Vascular;  Laterality: N/A;  ? LIGATION OF ARTERIOVENOUS  FISTULA Right 05/24/2019  ? Procedure: LIGATION OF ARTERIOVENOUS  FISTULA RIGHT ARM;  Surgeon: Waynetta Sandy, MD;  Location: [place];  Service: Vascular;  Laterality: Right;  ? MULTIPLE TOOTH EXTRACTIONS    ? none    ? UPPER EXTREMITY ANGIOGRAPHY Right 05/22/2019  ? Procedure: Upper Extremity Angiography;  Surgeon: Waynetta Sandy, MD;  Location: [place] [facility];  Service: Cardiovascular;  Laterality: Right;  ? UPPER EXTREMITY VENOGRAPHY Left 05/22/2019  ? Procedure: UPPER EXTREMITY VENOGRAPHY;  Surgeon: Waynetta Sandy, MD;  Location: [place] CV LAB;  Service: Cardiovascular;  Laterality: Left;  ? ?HPI:  ?Patient is a 71 yo male that presents on 4/7 for gangrene L BKA and ischemic R BKA with failed conservative measures resulting in s/p b/l AKA with b/l wound vac placement. Pt less responsive 4/12 and RN nursing supervisor reported it was reported to her that pt was coughing when eating drinking.  ?  ?Assessment / Plan / Recommendation  ?Clinical Impression ? Pt presents with a cognitive-lethargy/endurance based dysphagia marked by intermittent oral holding, labial spill and delayed  transit. He was responsive to therapist but inconsistently drowsy. He coughed with one of six sips water and prior to po's had productive baseline cough and vocal quality wet x 1. Cannot rule out a delayed swallow onset from observation. He is endentulous and given the aformentioned pt would benefit from downgrade to puree (Dys 1) consistency and continue thin, full supervision. Facilitate self feeding and ensure pt has swallowed and not holding. Prognosis for advancement is good. ST will follow. ?SLP Visit Diagnosis: Dysphagia, unspecified (R13.10) ?   ?Aspiration Risk ? Mild aspiration risk  ?  ?Diet Recommendation Dysphagia 1 (Puree);Thin liquid  ? ?Liquid Administration via: Cup;Straw ?Medication Administration: Crushed with puree ?Supervision: Staff to assist with self feeding;Full supervision/cueing for compensatory strategies ?Compensations: Slow rate;Small sips/bites;Minimize environmental distractions;Clear throat intermittently ?Postural Changes: Seated upright at 90 degrees  ?  ?Other  Recommendations Oral Care Recommendations: Oral care BID   ? ?Recommendations for follow up therapy are one component of a multi-disciplinary discharge planning process, led by the attending physician.  Recommendations may be updated based on patient status, additional functional criteria and insurance authorization. ? ?Follow up Recommendations No SLP follow up  ? ? ?  ?Assistance Recommended at Discharge None  ?Functional Status Assessment Patient has had a recent decline in their functional status and demonstrates the ability to make significant improvements in function in a reasonable and predictable amount of time.  ?Frequency and Duration min 2x/week  ?2 weeks ?  ?   ? ?Prognosis Prognosis for Safe Diet Advancement: Good  ? ?  ? ?Swallow Study   ?General Date of Onset: 03/05/2022 ?HPI: Patient is a 71 yo male that presents on 4/7 for gangrene L BKA and ischemic R BKA with failed conservative measures resulting in s/p  b/l AKA with b/l wound vac placement. Pt less responsive 4/12 and RN nursing supervisor reported it was reported to her that pt was coughing when eating drinking. ?Type of Study: Bedside Swallow Evaluation ?Previous Swallow Assessment: none ?Diet Prior to this Study: Regular;Thin liquids ?Temperature Spikes Noted: No ?Respiratory Status: Nasal cannula ?History of Recent Intubation: No (6 days ago for surgery) ?Behavior/Cognition: Lethargic/Drowsy;Cooperative;Pleasant mood ?Oral Cavity Assessment: Other (comment) (lingual discoloration) ?Oral Care Completed by SLP: No ?Oral Cavity - Dentition: Edentulous ?Vision: Functional for self-feeding ?Self-Feeding Abilities: Able to feed self;Needs assist ?Patient Positioning: Upright in bed ?Baseline Vocal Quality: Wet (intermittently wet) ?Volitional Swallow: Able to elicit  ?  ?Oral/Motor/Sensory Function Overall Oral Motor/Sensory Function: Mild impairment ?Facial ROM: Within Functional Limits ?Facial Symmetry: Within Functional Limits ?Facial Strength: Within Functional Limits ?Lingual Symmetry: Abnormal symmetry left ?Velum: Within Functional Limits ?Mandible: Within Functional Limits   ?Ice Chips Ice chips: Not tested   ?Thin Liquid Thin Liquid: Impaired ?Presentation: Straw ?Oral Phase Impairments: Reduced labial seal ?Oral Phase Functional Implications: Left anterior spillage;Oral holding;Prolonged oral transit ?Pharyngeal  Phase Impairments: Cough - Immediate  ?  ?Nectar Thick Nectar Thick Liquid: Not tested   ?Honey Thick Honey Thick Liquid: Not  tested   ?Puree Puree: Impaired ?Presentation: Spoon;Self Fed ?Oral Phase Impairments: Poor awareness of bolus ?Oral Phase Functional Implications: Oral holding;Prolonged oral transit ?Pharyngeal Phase Impairments:  (none)   ?Solid ? ? ?  Solid: Not tested  ? ?  ? ?Proby Siren ?Mar 07, 2022,1:22 PM ? ? ? ?

## 2022-03-16 NOTE — Plan of Care (Signed)

## 2022-03-16 DEATH — deceased
